# Patient Record
Sex: Male | Born: 1940
Health system: Southern US, Community
[De-identification: ages and names within clinical notes are randomized; demographics above are authoritative.]

## PROBLEM LIST (undated history)

## (undated) DIAGNOSIS — E785 Hyperlipidemia, unspecified: Secondary | ICD-10-CM

## (undated) DIAGNOSIS — M1990 Unspecified osteoarthritis, unspecified site: Secondary | ICD-10-CM

## (undated) DIAGNOSIS — N5089 Other specified disorders of the male genital organs: Secondary | ICD-10-CM

## (undated) DIAGNOSIS — G4730 Sleep apnea, unspecified: Secondary | ICD-10-CM

## (undated) DIAGNOSIS — IMO0001 Reserved for inherently not codable concepts without codable children: Secondary | ICD-10-CM

## (undated) DIAGNOSIS — R001 Bradycardia, unspecified: Secondary | ICD-10-CM

## (undated) DIAGNOSIS — I10 Essential (primary) hypertension: Secondary | ICD-10-CM

## (undated) DIAGNOSIS — Q631 Lobulated, fused and horseshoe kidney: Secondary | ICD-10-CM

## (undated) DIAGNOSIS — N189 Chronic kidney disease, unspecified: Secondary | ICD-10-CM

## (undated) DIAGNOSIS — Z531 Procedure and treatment not carried out because of patient's decision for reasons of belief and group pressure: Secondary | ICD-10-CM

## (undated) DIAGNOSIS — D649 Anemia, unspecified: Secondary | ICD-10-CM

## (undated) DIAGNOSIS — E039 Hypothyroidism, unspecified: Secondary | ICD-10-CM

## (undated) HISTORY — DX: Chronic kidney disease, unspecified: N18.9

## (undated) HISTORY — DX: Hyperlipidemia, unspecified: E78.5

## (undated) HISTORY — DX: Essential (primary) hypertension: I10

## (undated) HISTORY — PX: COLONOSCOPY W/ POLYPECTOMY: SHX1380

---
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
## 1991-10-10 HISTORY — PX: ADRENALECTOMY: SHX876

## 2001-04-22 ENCOUNTER — Encounter (INDEPENDENT_AMBULATORY_CARE_PROVIDER_SITE_OTHER): Payer: Self-pay | Admitting: Specialist

## 2001-04-22 ENCOUNTER — Ambulatory Visit (HOSPITAL_COMMUNITY): Admission: RE | Admit: 2001-04-22 | Discharge: 2001-04-22 | Payer: Self-pay | Admitting: Gastroenterology

## 2001-09-09 ENCOUNTER — Encounter: Payer: Self-pay | Admitting: Gastroenterology

## 2001-09-09 ENCOUNTER — Ambulatory Visit (HOSPITAL_COMMUNITY): Admission: RE | Admit: 2001-09-09 | Discharge: 2001-09-09 | Payer: Self-pay | Admitting: Gastroenterology

## 2005-01-02 ENCOUNTER — Ambulatory Visit (HOSPITAL_COMMUNITY): Admission: RE | Admit: 2005-01-02 | Discharge: 2005-01-02 | Payer: Self-pay | Admitting: Gastroenterology

## 2006-05-16 ENCOUNTER — Emergency Department (HOSPITAL_COMMUNITY): Admission: EM | Admit: 2006-05-16 | Discharge: 2006-05-16 | Payer: Self-pay | Admitting: Emergency Medicine

## 2008-05-01 ENCOUNTER — Ambulatory Visit (HOSPITAL_BASED_OUTPATIENT_CLINIC_OR_DEPARTMENT_OTHER): Admission: RE | Admit: 2008-05-01 | Discharge: 2008-05-01 | Payer: Self-pay | Admitting: Internal Medicine

## 2008-05-01 ENCOUNTER — Encounter: Payer: Self-pay | Admitting: Pulmonary Disease

## 2008-05-11 ENCOUNTER — Ambulatory Visit: Payer: Self-pay | Admitting: Pulmonary Disease

## 2008-05-26 ENCOUNTER — Ambulatory Visit: Payer: Self-pay | Admitting: Pulmonary Disease

## 2008-05-26 DIAGNOSIS — E119 Type 2 diabetes mellitus without complications: Secondary | ICD-10-CM | POA: Insufficient documentation

## 2008-05-26 DIAGNOSIS — E785 Hyperlipidemia, unspecified: Secondary | ICD-10-CM

## 2008-05-26 DIAGNOSIS — I10 Essential (primary) hypertension: Secondary | ICD-10-CM

## 2008-05-26 DIAGNOSIS — J309 Allergic rhinitis, unspecified: Secondary | ICD-10-CM | POA: Insufficient documentation

## 2008-05-26 DIAGNOSIS — G4730 Sleep apnea, unspecified: Secondary | ICD-10-CM

## 2008-05-26 DIAGNOSIS — G4733 Obstructive sleep apnea (adult) (pediatric): Secondary | ICD-10-CM | POA: Insufficient documentation

## 2008-07-31 ENCOUNTER — Ambulatory Visit (HOSPITAL_BASED_OUTPATIENT_CLINIC_OR_DEPARTMENT_OTHER): Admission: RE | Admit: 2008-07-31 | Discharge: 2008-07-31 | Payer: Self-pay | Admitting: Pulmonary Disease

## 2008-08-03 ENCOUNTER — Ambulatory Visit: Payer: Self-pay | Admitting: Pulmonary Disease

## 2008-08-11 ENCOUNTER — Encounter: Payer: Self-pay | Admitting: Pulmonary Disease

## 2008-09-30 ENCOUNTER — Ambulatory Visit: Payer: Self-pay | Admitting: Pulmonary Disease

## 2010-07-18 ENCOUNTER — Telehealth (INDEPENDENT_AMBULATORY_CARE_PROVIDER_SITE_OTHER): Payer: Self-pay | Admitting: *Deleted

## 2010-08-12 ENCOUNTER — Ambulatory Visit: Payer: Self-pay | Admitting: Pulmonary Disease

## 2010-11-08 NOTE — Progress Notes (Signed)
Summary: cpap mask order  Phone Note Call from Patient Call back at Home Phone [phone]   Caller: Patient Call For: alva Summary of Call: pt needs new face mask for cpap. adv home care. call pt cell [phone] or home (later today). Initial call taken by: Cooper Render, CNA,  July 18, 2010 12:32 PM  Follow-up for Phone Call        Pt has appt to see RA on 08-12-10 at 230pm; understands that he hasnt been seen since 12-09; pt is wondering if RA will help him out and send order for new CPAP mask before that appt.Clayborne Dana CMA  July 18, 2010 3:54 PM   done Follow-up by: Leanna Sato. Elsworth Soho MD,  July 18, 2010 5:54 PM  Additional Follow-up for Phone Call Additional follow up Details #1::        ATC pt's home and cell #s - [place] RN  July 19, 2010 9:01 AM  Pt returned call from triage. call pt at [phone]. Cooper Render, CNA  July 19, 2010 11:04 AM     Additional Follow-up for Phone Call Additional follow up Details #2::    Called, spoke with pt.  He was informed order for cpap mask sent to [facility] and to keep scheduled f/u appt with RA for 08/12/10 -- pt verbalized understanding.   Follow-up by: Raymondo Band RN,  July 19, 2010 11:11 AM

## 2010-11-08 NOTE — Assessment & Plan Note (Signed)
Summary: Sleep follow up-last OV 12-09/needs new CPAP mask/kcw   Visit Type:  Follow-up Copy to:  [facility] Primary Provider/Referring Provider:  [facility]  CC:  Pt states is using CPAP everynight approx 4 hours each.  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  Overnight PSG 7/09 >> severe obstructive sleep apnea with hypopneas , AHI 63/h, lowest desatn of 78%, longest event 52s. nocturnal cough noted. Improved with CPPA 15 cm  12/09  complinat with CPAP, less tired, better rest at night, , denies naps, c/o mask irritating bridge of nose. Has lost 7 lbs, off actose  August 12, 2010 2:57 PM - FU after 2 yrs changed mask after 2 y, pressure ok, uses CPAP every night, denies excessive daytime somnolence, snoring, feels rested on waking up  Preventive Screening-Counseling & Management  Alcohol-Tobacco     Smoking Status: quit     Year Quit: 1970     Pack years: 1/2ppd x4 yrs  Current Medications (verified): 1)  Levothyroxine Sodium 75 Mcg  Tabs (Levothyroxine Sodium) .... Take 1 Tablet By Mouth Once A Day 2)  Doxazosin Mesylate 8 Mg  Tabs (Doxazosin Mesylate) .... Take 1 Tablet By Mouth Once A Day 3)  Simvastatin 40 Mg  Tabs (Simvastatin) .... Take 1 Tablet By Mouth Once A Day 4)  Furosemide 40 Mg  Tabs (Furosemide) .Marland Kitchen.. 1 1/2 Once Daily 5)  Amlodipine Besylate 10 Mg Tabs (Amlodipine Besylate) .... Take 1 Tablet By Mouth Once A Day 6)  Lisinopril 40 Mg Tabs (Lisinopril) .... Take 1 Tablet By Mouth Once A Day 7)  Atenolol 50 Mg  Tabs (Atenolol) .... Take 1 Tablet By Mouth Once A Day 8)  Testosterone Cream .... Once Daily 9)  Cpap 15 Cm 10)  Aspirin Adult Low Strength 81 Mg Tbec (Aspirin) .Marland Kitchen.. 1 Once Daily 11)  Carvedilol 12.5 Mg Tabs (Carvedilol) .... Take 1 Tablet By Mouth Two Times A Day 12)  Humalog Mix 75/25 Kwikpen 75-25 % Susp (Insulin Lispro Prot & Lispro) .... 20 Units  Every Morning and 16 Units At Bedtime  Allergies  (verified): 1)  ! Penicillin  Past History:  Past Medical History: Last updated: 05/26/2008 Allergic Rhinitis Diabetes, Type 2 Hyperlipidemia Hypertension  Social History: Last updated: 05/26/2008 Patient states former smoker.  Patient is divorced and has 5 children  Review of Systems  The patient denies anorexia, fever, weight loss, weight gain, vision loss, decreased hearing, hoarseness, chest pain, syncope, dyspnea on exertion, peripheral edema, prolonged cough, headaches, hemoptysis, abdominal pain, melena, hematochezia, severe indigestion/heartburn, hematuria, muscle weakness, suspicious skin lesions, difficulty walking, depression, unusual weight change, abnormal bleeding, enlarged lymph nodes, and angioedema.    Vital Signs:  Patient profile:   70 year old male Height:      72 inches Weight:      284 pounds BMI:     38.66 O2 Sat:      97 % on Room air Temp:     98.1 degrees F oral Pulse rate:   62 / minute BP sitting:   150 / 80  (left arm) Cuff size:   large  Vitals Entered By: Iran Planas CMA (August 12, 2010 2:31 PM)  O2 Flow:  Room air CC: Pt states is using CPAP everynight approx 4 hours each Comments Medications reviewed with patient Verified contact number and pharmacy with patient Iran Planas CMA  August 12, 2010 2:32 PM    Physical Exam  Additional Exam:  wt 284 August 12, 2010  Gen. Pleasant, obese, in no distress ENT - no lesions, no post nasal drip, class 2 airway Neck: No JVD, no thyromegaly, no carotid bruits Lungs: no use of accessory muscles, no dullness to percussion, clear without rales or rhonchi  Cardiovascular: Rhythm regular, heart sounds  normal, no murmurs or gallops, no peripheral edema Musculoskeletal: No deformities, no cyanosis or clubbing      Impression & Recommendations:  Problem # 1:  OBSTRUCTIVE SLEEP APNEA (ICD-780.57)  ct CPAP 15 cm Compliance encouraged, wt loss emphasized, asked to avoid meds with  sedative side effects, cautioned against driving when sleepy.   Orders: Est. Patient Level III [id number]  Medications Added to Medication List This Visit: 1)  Amlodipine Besylate 10 Mg Tabs (Amlodipine besylate) .... Take 1 tablet by mouth once a day 2)  Lisinopril 40 Mg Tabs (Lisinopril) .... Take 1 tablet by mouth once a day 3)  Testosterone Cream  .... Once daily 4)  Carvedilol 12.5 Mg Tabs (Carvedilol) .... Take 1 tablet by mouth two times a day 5)  Humalog Mix 75/25 Kwikpen 75-25 % Susp (Insulin lispro prot & lispro) .... 20 units  every morning and 16 units at bedtime  Patient Instructions: 1)  Copy sent to: Dr Dagmar Hait 2)  Please schedule a follow-up appointment in 1 year. 3)  Change filters every 6 months    Immunization History:  Influenza Immunization History:    Influenza:  historical (06/13/2010)

## 2011-02-21 NOTE — Procedures (Signed)
NAMECAELON, KALLSEN NO.:  [id number]   MEDICAL RECORD NO.:  [id number]          PATIENT TYPE:  OUT   LOCATION:  SLEEP CENTER                 FACILITY[facility]  [facility]   PHYSICIAN:  Rigoberto Noel, MD      DATE OF BIRTH:  1941/03/11   DATE OF STUDY:  05/01/2008                            NOCTURNAL POLYSOMNOGRAM   REFERRING PHYSICIAN:   INDICATION FOR STUDY:  Mr. Rhew is a 70 year old African American  gentleman with excessive daytime somnolence, nonrefreshing sleep and  nocturia.  At the time of the study, he weighed 250 pounds, height 6  feet with a BMI of 34 and a neck size of 19 inches.   MEDICATIONS:  Glyburide, Cardura, Synthroid, Lipitor, Actos, aspirin,  Prinivil, Lasix, atenolol, testosterone, Glumetza, Sular, and Zocor.   This overnight polysomnogram was performed with a sleep technologist in  attendance, EEG, EOG, EMG, EKG and respiratory parameters were recorded.  Sleep stages, arousal, limb movements and respiratory data were scored  according to criteria laid out by the American Academy of sleep  medicine.   SLEEP ARCHITECTURE:  Lights out was at 9 p.m.  Lights on was at 4:32  a.m.  Total sleep time was 293 minutes with a sleep period of time of  415 minutes and sleep efficiency of 65%.  Wake after sleep onset was 123  minutes.  Sleep latency was 35 minutes and latency to REM sleep was 241  minutes.  Sleep stages with a percentage of total sleep time was N1  20.3%, N2 61.8%, N3 7% and REM sleep 10.9%, (32 minutes).  The longest  period of REM sleep was around 3:45 a.m.   RESPIRATORY DATA:  There were a total of 265 obstructive apneas, 0  central apneas, 0 mixed apneas and 42 hypopneas leading a apnea/hypopnea  index of 63.1 events per hour.  The supine AHI was 81.8 events per hour  and the REM related AHI was 65.6 events per hour.  The longest apnea was  52.2 seconds.   AROUSAL DATA:  There were a total of 141 arousals of which 11 were  spontaneous  and the rest were associated with respiratory movements  leading to an arousal index of 28.9 events per hour.  Periodic limb  movement index was 12.5 events per hour, however, one limb movement was  associated with arousal.   OXYGEN DATA:  The desaturation index was 77 events per hour, the lowest  desaturation was 78%.  He spent 59.7 minutes with saturation less than  88%.   CARDIAC DATA:  The lowest heart rate was 40 beats per minute during  sleep.  The high heart rate was artifactual, no arrhythmias were noted.   DISCUSSION:  Frequent apneas and hypopneas causing sleep fragmentation  and arousal regardless of body position.  Frequent episodes of coughing  during the night.   IMPRESSIONS:  1. Severe obstructive sleep apnea and hypopneas causing sleep      fragmentation and frequent oxygen desaturations.  2. Although periodic limb movements were observed, these were not      associated with arousal.  3. No evidence of cardiac arrhythmias or behavioral  disturbance during      sleep.  4. Nocturnal cough.   RECOMMENDATIONS:  1. The treatment options for this degree of sleep apnea, includes CPAP      therapy and weight loss.  I would recommend a CPAP titration study      in the sleep lab to achieve optimal titration given the severity of      his sleep disorder breathing.  2. He should be asked to avoid driving when sleepy and avoid      medications with sedative side effects.  3. Explore the possibility of Prinivil induced cough causing frequent      arousals.      Rigoberto Noel, MD  Electronically Signed     RVA/MEDQ  D:  05/11/2008 09:49:59  T:  05/11/2008 10:16:56  Job:  [id number]   cc:   Berneta Sages, M.D.  Fax: [phone]

## 2011-02-24 NOTE — Procedures (Signed)
[facility]  Patient:    David Nguyen, David Nguyen                       MRN: [id number] Proc. Date: 04/22/01 Adm. Date:  [id number] Attending:  Rafael Bihari CC:         Crist Infante, M.D.   Procedure Report  PROCEDURES:  Colonoscopy with polypectomy.  INDICATIONS FOR PROCEDURE:  Polyps seen on flexible sigmoidoscopy.  DESCRIPTION OF PROCEDURE:  The patient was placed in the left lateral decubitus position and placed on the pulse monitor with continuous low-flow oxygen delivered by nasal cannula.  He was sedated with 70 mg of IV Demerol and 5 mg of IV Versed.  The Olympus video colonoscope was inserted into the rectum and advanced its entire length, but due to presumed large size of the patient, the cecum was not reached despite insertion of the scope its entire length.  It was not clear, but felt that the point of most proximal evaluation was probably somewhere in the mid transverse colon.  The prep was fairly good and the area was examined.  The visualized portion of the transverse colon, as well as the descending and sigmoid appeared normal with no masses, polyps, diverticula, or other mucosal abnormalities.  Within the rectum at approximately 12 cm above the anus was seen a 1.5 cm, sessile, slightly irregular polyp which was removed by snare.  The remainder of the rectum appeared normal.  The colonoscope was then withdrawn.  The patient was then returned to the recovery room in stable condition.  He tolerated the procedure well and there were no immediate complications.  IMPRESSION: 1. Rectal polyp. 2. Incomplete colonoscopy.  PLAN:  Await histology and presuming no invasive cancer, will proceed with barium enema at some point in the new few months. DD:  04/22/01 TD:  04/22/01 Job: [id number]

## 2011-02-24 NOTE — Procedures (Signed)
David Nguyen, David Nguyen NO.:  [id number]   MEDICAL RECORD NO.:  [id number]          PATIENT TYPE:  OUT   LOCATION:  SLEEP CENTER                 FACILITY:  [facility]   PHYSICIAN:  Rigoberto Noel, MD      DATE OF BIRTH:  07-Aug-1941   DATE OF STUDY:  08/03/2008                            NOCTURNAL POLYSOMNOGRAM   REFERRING PHYSICIAN:   INDICATION FOR THE STUDY:  David Nguyen is a 70 year old gentleman with  severe obstructive sleep apnea based on an overnight polysomnogram and  diagnostic polysomnogram in July 2009 with AHI 63 per hour and a lowest  desaturation of 78%.  At the time of this study, his weight was 260  pounds with a height of 6 feet and a BMI of 35, neck size 19 inches, and  Epworth sleepiness score of 6/24.   This CPAP titration study was performed with a sleep technologist in  attendance.  EEG, EOG, EMG, EKG, and respiratory parameters were  recorded.  Sleep stage is arousal.  Limb movements and respiratory data  were scored according to criteria laid out by the American Academy of  Sleep Medicine.   SLEEP ARCHITECTURE:  Light felt was at 9:39 p.m.  Light sound was at  5:43 a.m.  Total sleep time was 386.5 minutes with a sleep period time  of 481.5 minutes and a sleep efficiency of 80%.  Sleep latency was 3  minutes with no wake-ups.  Sleep onset of 95 minutes.  Sleep stages as a  percentage of total sleep time was N1 4.1%, N2 72.30%, and REM sleep  23.5% (91 minutes).  There was good REM progression through the night.  Supine sleep accounted for 187 minutes.   AROUSAL DATA:  There were a total of 79 arousals with an arousal index  of 12.3 events per hour.  Of these, 45 were spontaneous, 9 were  associated with periodic limb movements.   RESPIRATORY DATA:  CPAP was initiated at 5 cm and titrated up with 0  respiratory events and snoring.  At CPAP of 8 cm were 80 minutes of  sleep including 20 minutes of REM sleep, 2 obstructive apneas, 3 central  apneas, and 10 arousals were noted with a lowest desaturation of 87%.   At the level of 13 cm for 72 minutes of sleep including 21.5 minutes of  REM sleep, 6 obstructive apneas and 7 arousals were noted with a lowest  desaturation of 89%.   At the level of 15 cm for 51.5 minutes including 14 minutes of REM  sleep, 1 obstructive apnea and 2 hypopneas were noted with 5 arousals  and a lowest desaturation of 91%.  This appears to be the optimal level  used during the study.   PERIODIC LIMB MOVEMENT DATA:  A 253 periodic limb movements were  observed with an index of 39 events per hour.  The PLM arousal index was  1.4 events per hour.  PLM seemed to be predominant in the lower levels  of CPAP and were obliterated by high levels of CPAP.   OXYGEN SATURATION DATA:  Low desaturation at the final and the optimal  level of 15 cm was 91%.   DISCUSSION:  Severe periodic limb movements were noted at lower levels  of CPAP, which seemed to disappear at higher levels, 2 premature  ventricular contractions were noted.  The lowest heart rate was 36 beats  per minute during REM sleep.   IMPRESSION:  1. Severe obstructive sleep apnea, seems to be corrected, an optimal      CPAP level of 15 cm.  2. Periodic limb movements, seemed to be corrected at higher levels of      CPAP.  3. No evidence of cardiac arrhythmias or behavioral disturbance during      sleep.   RECOMMEND:  1. CPAP should be initiated with a goal of 15 cm with heated humidity.      The patient was desensitized with a ResMed Mirage Quattro medium-      sized mask.  2. He should be monitored for compliance at this level.  3. He should be asked to avoid medications with sedative side effects      and asked to avoid driving when sleeping.      Rigoberto Noel, MD  Electronically Signed     RVA/MEDQ  D:  08/03/2008 14:07:48  T:  08/04/2008 00:50:24  Job:  [id number]

## 2011-02-24 NOTE — Procedures (Signed)
[facility]  Patient:    David Nguyen, David Nguyen                       MRN: [id number] Proc. Date: 04/22/01 Adm. Date:  [id number] Attending:  Rafael Bihari CC:         Crist Infante, M.D.   Procedure Report  PROCEDURE PERFORMED:  Colonoscopy.  ENDOSCOPIST:  Elyse Jarvis. Amedeo Plenty, M.D.  INDICATIONS FOR PROCEDURE:  Family history of colon cancer in a first degree relative and polyps seen on flexible sigmoidoscopy.  DESCRIPTION OF PROCEDURE:  The patient was placed in the left lateral decubitus position and placed on the pulse monitor with continuous low flow oxygen delivered by nasal cannula.  He was sedated with 70 mg IV Demerol and 5 mg IV Versed.  The Olympus video colonoscope was inserted into the rectum and advanced to the cecum, confirmed by transillumination of McBurneys point. DD:  04/22/01 TD:  04/22/01 Job: [id number]

## 2011-02-24 NOTE — Op Note (Signed)
David Nguyen, ZIMMERLE                ACCOUNT NO.:  [id number]   MEDICAL RECORD NO.:  [id number]          PATIENT TYPE:  AMB   LOCATION:  ENDO                         FACILITY:  [facility]   PHYSICIAN:  John C. Amedeo Plenty, M.D.    DATE OF BIRTH:  [birth year]   DATE OF PROCEDURE:  01/02/2005  DATE OF DISCHARGE:                                 OPERATIVE REPORT   PROCEDURE:  Colonoscopy.   INDICATIONS FOR PROCEDURE:  History of adenomatous colon polyps.   PROCEDURE:  The patient was placed in the left lateral decubitus position  and placed on the pulse monitor with continuous low flow oxygen delivered by  nasal cannula.  He was sedated with 50 mcg IV fentanyl and 6 mg IV Versed.  The Olympus video colonoscope was inserted into the rectum and advanced to  the cecum, confirmed by transillumination at McBurney's point and  visualization of the ileocecal valve and appendiceal orifice.  The prep was  good.  The cecum, ascending, transverse, descending, and sigmoid colon all  appeared normal, with no masses, polyps, diverticula, or other mucosal  abnormalities.  The rectum likewise appeared normal on retroflexed view.  The anus revealed no obvious internal hemorrhoids.  The scope was then  withdrawn and the patient returned to the recovery room in stable condition.  He tolerated the procedure well, and there were no immediate complications.   IMPRESSION:  Normal colonoscopy.   PLAN:  Repeat study in five years.      JCH/MEDQ  D:  01/02/2005  T:  01/02/2005  Job:  [id number]   cc:   Elta Guadeloupe A. Joylene Draft, M.D.  [address]  [place]  [place] [address]  Fax: [phone]

## 2011-04-19 ENCOUNTER — Telehealth: Payer: Self-pay | Admitting: Pulmonary Disease

## 2011-04-19 DIAGNOSIS — G4733 Obstructive sleep apnea (adult) (pediatric): Secondary | ICD-10-CM

## 2011-04-19 NOTE — Telephone Encounter (Signed)
Void pt to call back w/ more info

## 2011-04-19 NOTE — Telephone Encounter (Signed)
Pt needs a new CPAP mask through [facility], his new DME. Pt last seen in Nov 2011. And not due for F/U  For 1 year. Will send order to [facility].

## 2011-04-20 ENCOUNTER — Telehealth: Payer: Self-pay | Admitting: Pulmonary Disease

## 2011-04-20 NOTE — Telephone Encounter (Signed)
Spoke with pt. He states that we order him new CPAP mask yesterday, but the order was sent to [facility] instead of [facility]. I advised that according to the chart, the order was sent to [facility]. Will forward to [facility] to see what happened. Please advise thanks

## 2011-04-20 NOTE — Telephone Encounter (Signed)
Order refaxed to [facility] to get cpap mask

## 2011-05-31 ENCOUNTER — Telehealth: Payer: Self-pay | Admitting: Pulmonary Disease

## 2011-05-31 NOTE — Telephone Encounter (Signed)
AHI 8.9 acceptable on cpap 15 cm but complaince about 3h 45 m Needs to increase usage - 4-6h every night advised

## 2011-06-06 ENCOUNTER — Encounter: Payer: Self-pay | Admitting: Pulmonary Disease

## 2011-06-07 NOTE — Telephone Encounter (Signed)
lmomtcb x1 

## 2011-06-08 NOTE — Telephone Encounter (Signed)
I informed pt of RA's findings and recommendations. Pt verbalized understanding  

## 2011-06-08 NOTE — Telephone Encounter (Signed)
PT RETURNED [place] #. David Nguyen

## 2011-09-28 ENCOUNTER — Encounter: Payer: Self-pay | Admitting: Pulmonary Disease

## 2011-09-28 ENCOUNTER — Encounter: Payer: Self-pay | Admitting: *Deleted

## 2011-09-29 ENCOUNTER — Encounter: Payer: Self-pay | Admitting: Pulmonary Disease

## 2011-09-29 ENCOUNTER — Ambulatory Visit (INDEPENDENT_AMBULATORY_CARE_PROVIDER_SITE_OTHER): Payer: Medicare PPO | Admitting: Pulmonary Disease

## 2011-09-29 VITALS — BP 162/78 | HR 56 | Temp 98.2°F | Ht 72.0 in | Wt 286.8 lb

## 2011-09-29 DIAGNOSIS — G4730 Sleep apnea, unspecified: Secondary | ICD-10-CM

## 2011-09-29 NOTE — Progress Notes (Signed)
  Subjective:    Patient ID: David Nguyen, male    DOB: 03-18-1941, 70 y.o.   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  Overnight PSG 7/09 >> severe obstructive sleep apnea with hypopneas , AHI 63/h, lowest desatn of 78%, longest event 52s. nocturnal cough noted. Improved with CPAP 15 cm  12/09 compliant, Has lost 7 lbs, off actose   8/12 >> AHI 8.9 acceptable on cpap 15 cm but complaince about 3h 45 m  Needs to increase usage    09/29/2011 Annual  FU -pressure ok, uses CPAP every night, denies excessive daytime somnolence, snoring, feels rested on waking up, no dryness, BP slight high today    Review of Systems Patient denies significant dyspnea,cough, hemoptysis,  chest pain, palpitations, pedal edema, orthopnea, paroxysmal nocturnal dyspnea, lightheadedness, nausea, vomiting, abdominal or  leg pains      Objective:   Physical Exam Gen. Pleasant, well-nourished, in no distress ENT - no lesions, no post nasal drip Neck: No JVD, no thyromegaly, no carotid bruits Lungs: no use of accessory muscles, no dullness to percussion, clear without rales or rhonchi  Cardiovascular: Rhythm regular, heart sounds  normal, no murmurs or gallops, no peripheral edema Musculoskeletal: No deformities, no cyanosis or clubbing         Assessment & Plan:

## 2011-09-29 NOTE — Patient Instructions (Signed)
Change CPAP filters every 6  Months Your BP was high today - recheck this in 2 days Use CPAP for at least 4-6 hrs every night is the expectation.

## 2011-09-30 NOTE — Assessment & Plan Note (Signed)
Severe, on CPAP 15 cm Compliant Weight loss encouraged, compliance with goal of at least 4-6 hrs every night is the expectation. Advised against medications with sedative side effects Cautioned against driving when sleepy - understanding that sleepiness will vary on a day to day basis

## 2012-03-01 ENCOUNTER — Encounter (INDEPENDENT_AMBULATORY_CARE_PROVIDER_SITE_OTHER): Payer: Self-pay | Admitting: Ophthalmology

## 2012-03-08 ENCOUNTER — Encounter (INDEPENDENT_AMBULATORY_CARE_PROVIDER_SITE_OTHER): Payer: Medicare PPO | Admitting: Ophthalmology

## 2012-03-08 DIAGNOSIS — H35039 Hypertensive retinopathy, unspecified eye: Secondary | ICD-10-CM

## 2012-03-08 DIAGNOSIS — I10 Essential (primary) hypertension: Secondary | ICD-10-CM

## 2012-03-08 DIAGNOSIS — E1165 Type 2 diabetes mellitus with hyperglycemia: Secondary | ICD-10-CM

## 2012-03-08 DIAGNOSIS — H2510 Age-related nuclear cataract, unspecified eye: Secondary | ICD-10-CM

## 2012-03-08 DIAGNOSIS — H43819 Vitreous degeneration, unspecified eye: Secondary | ICD-10-CM

## 2012-03-08 DIAGNOSIS — E11319 Type 2 diabetes mellitus with unspecified diabetic retinopathy without macular edema: Secondary | ICD-10-CM

## 2012-09-13 ENCOUNTER — Ambulatory Visit (INDEPENDENT_AMBULATORY_CARE_PROVIDER_SITE_OTHER): Payer: Medicare PPO | Admitting: Ophthalmology

## 2012-09-16 ENCOUNTER — Telehealth: Payer: Self-pay | Admitting: Pulmonary Disease

## 2012-09-16 NOTE — Telephone Encounter (Signed)
Left Message 3x to schd follow up apt. Sent letter 09/16/12. °

## 2012-09-27 ENCOUNTER — Ambulatory Visit (INDEPENDENT_AMBULATORY_CARE_PROVIDER_SITE_OTHER): Payer: Medicare PPO | Admitting: Ophthalmology

## 2012-09-27 DIAGNOSIS — H2510 Age-related nuclear cataract, unspecified eye: Secondary | ICD-10-CM

## 2012-09-27 DIAGNOSIS — H35039 Hypertensive retinopathy, unspecified eye: Secondary | ICD-10-CM

## 2012-09-27 DIAGNOSIS — I10 Essential (primary) hypertension: Secondary | ICD-10-CM

## 2012-09-27 DIAGNOSIS — E11319 Type 2 diabetes mellitus with unspecified diabetic retinopathy without macular edema: Secondary | ICD-10-CM

## 2012-09-27 DIAGNOSIS — E1165 Type 2 diabetes mellitus with hyperglycemia: Secondary | ICD-10-CM

## 2012-09-27 DIAGNOSIS — H43819 Vitreous degeneration, unspecified eye: Secondary | ICD-10-CM

## 2012-09-27 DIAGNOSIS — E1139 Type 2 diabetes mellitus with other diabetic ophthalmic complication: Secondary | ICD-10-CM

## 2012-10-07 ENCOUNTER — Encounter: Payer: Self-pay | Admitting: Pulmonary Disease

## 2012-10-07 ENCOUNTER — Ambulatory Visit (INDEPENDENT_AMBULATORY_CARE_PROVIDER_SITE_OTHER): Payer: Medicare PPO | Admitting: Pulmonary Disease

## 2012-10-07 VITALS — BP 150/76 | HR 60 | Temp 98.4°F | Ht 72.0 in | Wt 295.2 lb

## 2012-10-07 DIAGNOSIS — G4730 Sleep apnea, unspecified: Secondary | ICD-10-CM

## 2012-10-07 NOTE — Assessment & Plan Note (Signed)
Weight loss encouraged, compliance with goal of at least 4-6 hrs every night is the expectation. Advised against medications with sedative side effects Cautioned against driving when sleepy - understanding that sleepiness will vary on a day to day basis  

## 2012-10-07 NOTE — Progress Notes (Signed)
  Subjective:    Patient ID: David Nguyen, male    DOB: 17-Feb-1941, 71 y.o.   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  Overnight PSG 7/09 >> severe obstructive sleep apnea with hypopneas , AHI 63/h, lowest desatn of 78%, longest event 52s. nocturnal cough noted. Improved with CPAP 15 cm  12/09 compliant, Has lost 7 lbs, off actose  8/12 >> AHI 8.9 acceptable on cpap 15 cm but complaince about 3h 45 m  Needs to increase usage    10/07/2012 Annual FU -pressure ok, uses CPAP every night, denies excessive daytime somnolence, snoring, feels rested on waking up, no dryness wears cpap everynight x 6-8 hrs a night. is due for a new machine. needs new mask (has not changed mask in over 1 year). occasionally he feels tired during the day.   Review of Systems  neg for any significant sore throat, dysphagia, itching, sneezing, nasal congestion or excess/ purulent secretions, fever, chills, sweats, unintended wt loss, pleuritic or exertional cp, hempoptysis, orthopnea pnd or change in chronic leg swelling. Also denies presyncope, palpitations, heartburn, abdominal pain, nausea, vomiting, diarrhea or change in bowel or urinary habits, dysuria,hematuria, rash, arthralgias, visual complaints, headache, numbness weakness or ataxia.     Objective:   Physical Exam  Gen. Pleasant, obese, in no distress ENT - no lesions, no post nasal drip Neck: No JVD, no thyromegaly, no carotid bruits Lungs: no use of accessory muscles, no dullness to percussion, decreased without rales or rhonchi  Cardiovascular: Rhythm regular, heart sounds  normal, no murmurs or gallops, no peripheral edema Musculoskeletal: No deformities, no cyanosis or clubbing , no tremors       Assessment & Plan:

## 2012-10-07 NOTE — Patient Instructions (Signed)
Your CPAP is set at 15 cm Get your supplies every 6 months

## 2013-03-28 ENCOUNTER — Ambulatory Visit (INDEPENDENT_AMBULATORY_CARE_PROVIDER_SITE_OTHER): Payer: Medicare HMO | Admitting: Ophthalmology

## 2013-03-28 DIAGNOSIS — E11319 Type 2 diabetes mellitus with unspecified diabetic retinopathy without macular edema: Secondary | ICD-10-CM

## 2013-03-28 DIAGNOSIS — H2510 Age-related nuclear cataract, unspecified eye: Secondary | ICD-10-CM

## 2013-03-28 DIAGNOSIS — H35039 Hypertensive retinopathy, unspecified eye: Secondary | ICD-10-CM

## 2013-03-28 DIAGNOSIS — H43819 Vitreous degeneration, unspecified eye: Secondary | ICD-10-CM

## 2013-03-28 DIAGNOSIS — I10 Essential (primary) hypertension: Secondary | ICD-10-CM

## 2013-03-28 DIAGNOSIS — E1139 Type 2 diabetes mellitus with other diabetic ophthalmic complication: Secondary | ICD-10-CM

## 2013-10-17 ENCOUNTER — Ambulatory Visit (INDEPENDENT_AMBULATORY_CARE_PROVIDER_SITE_OTHER): Payer: Commercial Managed Care - HMO | Admitting: Ophthalmology

## 2013-10-23 ENCOUNTER — Ambulatory Visit (INDEPENDENT_AMBULATORY_CARE_PROVIDER_SITE_OTHER): Payer: Medicare HMO | Admitting: Ophthalmology

## 2013-11-07 ENCOUNTER — Ambulatory Visit (INDEPENDENT_AMBULATORY_CARE_PROVIDER_SITE_OTHER): Payer: Medicare HMO | Admitting: Ophthalmology

## 2013-11-07 DIAGNOSIS — I10 Essential (primary) hypertension: Secondary | ICD-10-CM

## 2013-11-07 DIAGNOSIS — H2510 Age-related nuclear cataract, unspecified eye: Secondary | ICD-10-CM

## 2013-11-07 DIAGNOSIS — H35039 Hypertensive retinopathy, unspecified eye: Secondary | ICD-10-CM

## 2013-11-07 DIAGNOSIS — E1165 Type 2 diabetes mellitus with hyperglycemia: Secondary | ICD-10-CM

## 2013-11-07 DIAGNOSIS — H43819 Vitreous degeneration, unspecified eye: Secondary | ICD-10-CM

## 2013-11-07 DIAGNOSIS — E1139 Type 2 diabetes mellitus with other diabetic ophthalmic complication: Secondary | ICD-10-CM

## 2013-11-07 DIAGNOSIS — E11319 Type 2 diabetes mellitus with unspecified diabetic retinopathy without macular edema: Secondary | ICD-10-CM

## 2013-11-11 ENCOUNTER — Ambulatory Visit (INDEPENDENT_AMBULATORY_CARE_PROVIDER_SITE_OTHER): Payer: Medicare PPO | Admitting: Pulmonary Disease

## 2013-11-11 ENCOUNTER — Encounter (INDEPENDENT_AMBULATORY_CARE_PROVIDER_SITE_OTHER): Payer: Self-pay

## 2013-11-11 ENCOUNTER — Encounter: Payer: Self-pay | Admitting: Pulmonary Disease

## 2013-11-11 VITALS — BP 126/70 | HR 68 | Wt 250.0 lb

## 2013-11-11 DIAGNOSIS — G4730 Sleep apnea, unspecified: Secondary | ICD-10-CM

## 2013-11-11 NOTE — Assessment & Plan Note (Signed)
Your cpap is set at 15 cm We will check pressure on your cpap report  Weight loss encouraged, compliance with goal of at least 4-6 hrs every night is the expectation. Advised against medications with sedative side effects Cautioned against driving when sleepy - understanding that sleepiness will vary on a day to day basis

## 2013-11-11 NOTE — Patient Instructions (Signed)
Your cpap is set at 15 cm We will check pressure on your cpap report

## 2013-11-11 NOTE — Progress Notes (Signed)
   Subjective:    Patient ID: David Nguyen, male    DOB: 06/01/1941, 73 y.o.   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  Overnight PSG 7/09 >> severe obstructive sleep apnea with hypopneas , AHI 63/h, lowest desatn of 78%, longest event 52s. nocturnal cough noted. Improved with CPAP 15 cm  12/09 compliant 8/12 >> AHI 8.9 acceptable on cpap 15 cm but complaince about 3h 45 m    11/11/2013    Annual FU -he got new machine in 2014 pressure ok, uses CPAP every night, denies excessive daytime somnolence, snoring, feels rested on waking up, no dryness  wears cpap everynight x 6-8 hrs a night.      Review of Systems  neg for any significant sore throat, dysphagia, itching, sneezing, nasal congestion or excess/ purulent secretions, fever, chills, sweats, unintended wt loss, pleuritic or exertional cp, hempoptysis, orthopnea pnd or change in chronic leg swelling. Also denies presyncope, palpitations, heartburn, abdominal pain, nausea, vomiting, diarrhea or change in bowel or urinary habits, dysuria,hematuria, rash, arthralgias, visual complaints, headache, numbness weakness or ataxia.     Objective:   Physical Exam  Gen. Pleasant, obese, in no distress ENT - no lesions, no post nasal drip Neck: No JVD, no thyromegaly, no carotid bruits Lungs: no use of accessory muscles, no dullness to percussion, decreased without rales or rhonchi  Cardiovascular: Rhythm regular, heart sounds  normal, no murmurs or gallops, no peripheral edema Musculoskeletal: No deformities, no cyanosis or clubbing , no tremors        Assessment & Plan:

## 2013-12-12 ENCOUNTER — Telehealth: Payer: Self-pay | Admitting: *Deleted

## 2013-12-12 ENCOUNTER — Telehealth: Payer: Self-pay | Admitting: Pulmonary Disease

## 2013-12-12 DIAGNOSIS — G4733 Obstructive sleep apnea (adult) (pediatric): Secondary | ICD-10-CM

## 2013-12-12 NOTE — Telephone Encounter (Signed)
Pt aware of results 

## 2013-12-12 NOTE — Telephone Encounter (Signed)
Good usage AHI 20/h Increase CPAP to 16 cm and recheck download in 1 month --  Spouse aware of results. Order placed. Nothing further needed

## 2014-06-05 ENCOUNTER — Ambulatory Visit (INDEPENDENT_AMBULATORY_CARE_PROVIDER_SITE_OTHER): Payer: Commercial Managed Care - HMO | Admitting: Ophthalmology

## 2014-06-05 DIAGNOSIS — E1165 Type 2 diabetes mellitus with hyperglycemia: Secondary | ICD-10-CM

## 2014-06-05 DIAGNOSIS — E1139 Type 2 diabetes mellitus with other diabetic ophthalmic complication: Secondary | ICD-10-CM

## 2014-06-05 DIAGNOSIS — H35039 Hypertensive retinopathy, unspecified eye: Secondary | ICD-10-CM

## 2014-06-05 DIAGNOSIS — E11319 Type 2 diabetes mellitus with unspecified diabetic retinopathy without macular edema: Secondary | ICD-10-CM

## 2014-06-05 DIAGNOSIS — I10 Essential (primary) hypertension: Secondary | ICD-10-CM

## 2014-06-05 DIAGNOSIS — H43819 Vitreous degeneration, unspecified eye: Secondary | ICD-10-CM

## 2014-06-05 DIAGNOSIS — H2510 Age-related nuclear cataract, unspecified eye: Secondary | ICD-10-CM

## 2014-10-27 DIAGNOSIS — E291 Testicular hypofunction: Secondary | ICD-10-CM | POA: Diagnosis not present

## 2014-11-04 DIAGNOSIS — E291 Testicular hypofunction: Secondary | ICD-10-CM | POA: Diagnosis not present

## 2014-11-04 DIAGNOSIS — I951 Orthostatic hypotension: Secondary | ICD-10-CM | POA: Diagnosis not present

## 2014-11-04 DIAGNOSIS — Z6838 Body mass index (BMI) 38.0-38.9, adult: Secondary | ICD-10-CM | POA: Diagnosis not present

## 2014-11-04 DIAGNOSIS — Z1389 Encounter for screening for other disorder: Secondary | ICD-10-CM | POA: Diagnosis not present

## 2014-11-04 DIAGNOSIS — N184 Chronic kidney disease, stage 4 (severe): Secondary | ICD-10-CM | POA: Diagnosis not present

## 2014-11-04 DIAGNOSIS — E1129 Type 2 diabetes mellitus with other diabetic kidney complication: Secondary | ICD-10-CM | POA: Diagnosis not present

## 2014-11-17 DIAGNOSIS — E291 Testicular hypofunction: Secondary | ICD-10-CM | POA: Diagnosis not present

## 2014-12-08 DIAGNOSIS — Z6838 Body mass index (BMI) 38.0-38.9, adult: Secondary | ICD-10-CM | POA: Diagnosis not present

## 2014-12-08 DIAGNOSIS — E1129 Type 2 diabetes mellitus with other diabetic kidney complication: Secondary | ICD-10-CM | POA: Diagnosis not present

## 2014-12-08 DIAGNOSIS — E291 Testicular hypofunction: Secondary | ICD-10-CM | POA: Diagnosis not present

## 2014-12-14 ENCOUNTER — Ambulatory Visit (INDEPENDENT_AMBULATORY_CARE_PROVIDER_SITE_OTHER): Payer: Commercial Managed Care - HMO | Admitting: Ophthalmology

## 2014-12-18 DIAGNOSIS — I10 Essential (primary) hypertension: Secondary | ICD-10-CM | POA: Diagnosis not present

## 2014-12-18 DIAGNOSIS — I951 Orthostatic hypotension: Secondary | ICD-10-CM | POA: Diagnosis not present

## 2014-12-18 DIAGNOSIS — E1322 Other specified diabetes mellitus with diabetic chronic kidney disease: Secondary | ICD-10-CM | POA: Diagnosis not present

## 2014-12-18 DIAGNOSIS — E1365 Other specified diabetes mellitus with hyperglycemia: Secondary | ICD-10-CM | POA: Diagnosis not present

## 2015-01-05 DIAGNOSIS — E291 Testicular hypofunction: Secondary | ICD-10-CM | POA: Diagnosis not present

## 2015-01-28 DIAGNOSIS — E291 Testicular hypofunction: Secondary | ICD-10-CM | POA: Diagnosis not present

## 2015-03-17 DIAGNOSIS — E291 Testicular hypofunction: Secondary | ICD-10-CM | POA: Diagnosis not present

## 2015-04-06 DIAGNOSIS — Z125 Encounter for screening for malignant neoplasm of prostate: Secondary | ICD-10-CM | POA: Diagnosis not present

## 2015-04-06 DIAGNOSIS — E039 Hypothyroidism, unspecified: Secondary | ICD-10-CM | POA: Diagnosis not present

## 2015-04-06 DIAGNOSIS — M109 Gout, unspecified: Secondary | ICD-10-CM | POA: Diagnosis not present

## 2015-04-06 DIAGNOSIS — E291 Testicular hypofunction: Secondary | ICD-10-CM | POA: Diagnosis not present

## 2015-04-06 DIAGNOSIS — E1129 Type 2 diabetes mellitus with other diabetic kidney complication: Secondary | ICD-10-CM | POA: Diagnosis not present

## 2015-04-13 DIAGNOSIS — I4510 Unspecified right bundle-branch block: Secondary | ICD-10-CM | POA: Diagnosis not present

## 2015-04-13 DIAGNOSIS — Z23 Encounter for immunization: Secondary | ICD-10-CM | POA: Diagnosis not present

## 2015-04-13 DIAGNOSIS — E291 Testicular hypofunction: Secondary | ICD-10-CM | POA: Diagnosis not present

## 2015-04-13 DIAGNOSIS — M109 Gout, unspecified: Secondary | ICD-10-CM | POA: Diagnosis not present

## 2015-04-13 DIAGNOSIS — Z0000 Encounter for general adult medical examination without abnormal findings: Secondary | ICD-10-CM | POA: Diagnosis not present

## 2015-04-13 DIAGNOSIS — I872 Venous insufficiency (chronic) (peripheral): Secondary | ICD-10-CM | POA: Diagnosis not present

## 2015-04-13 DIAGNOSIS — N184 Chronic kidney disease, stage 4 (severe): Secondary | ICD-10-CM | POA: Diagnosis not present

## 2015-04-13 DIAGNOSIS — K5900 Constipation, unspecified: Secondary | ICD-10-CM | POA: Diagnosis not present

## 2015-04-13 DIAGNOSIS — E1129 Type 2 diabetes mellitus with other diabetic kidney complication: Secondary | ICD-10-CM | POA: Diagnosis not present

## 2015-04-14 DIAGNOSIS — Z1212 Encounter for screening for malignant neoplasm of rectum: Secondary | ICD-10-CM | POA: Diagnosis not present

## 2015-04-29 DIAGNOSIS — E291 Testicular hypofunction: Secondary | ICD-10-CM | POA: Diagnosis not present

## 2015-05-18 DIAGNOSIS — D122 Benign neoplasm of ascending colon: Secondary | ICD-10-CM | POA: Diagnosis not present

## 2015-05-18 DIAGNOSIS — Z09 Encounter for follow-up examination after completed treatment for conditions other than malignant neoplasm: Secondary | ICD-10-CM | POA: Diagnosis not present

## 2015-05-18 DIAGNOSIS — Z86010 Personal history of colonic polyps: Secondary | ICD-10-CM | POA: Diagnosis not present

## 2015-05-18 DIAGNOSIS — D123 Benign neoplasm of transverse colon: Secondary | ICD-10-CM | POA: Diagnosis not present

## 2015-05-27 DIAGNOSIS — E1129 Type 2 diabetes mellitus with other diabetic kidney complication: Secondary | ICD-10-CM | POA: Diagnosis not present

## 2015-05-27 DIAGNOSIS — D631 Anemia in chronic kidney disease: Secondary | ICD-10-CM | POA: Diagnosis not present

## 2015-05-27 DIAGNOSIS — E291 Testicular hypofunction: Secondary | ICD-10-CM | POA: Diagnosis not present

## 2015-05-27 DIAGNOSIS — I129 Hypertensive chronic kidney disease with stage 1 through stage 4 chronic kidney disease, or unspecified chronic kidney disease: Secondary | ICD-10-CM | POA: Diagnosis not present

## 2015-05-27 DIAGNOSIS — N184 Chronic kidney disease, stage 4 (severe): Secondary | ICD-10-CM | POA: Diagnosis not present

## 2015-06-17 DIAGNOSIS — E291 Testicular hypofunction: Secondary | ICD-10-CM | POA: Diagnosis not present

## 2015-06-30 DIAGNOSIS — N2581 Secondary hyperparathyroidism of renal origin: Secondary | ICD-10-CM | POA: Diagnosis not present

## 2015-06-30 DIAGNOSIS — N184 Chronic kidney disease, stage 4 (severe): Secondary | ICD-10-CM | POA: Diagnosis not present

## 2015-07-03 DIAGNOSIS — Z23 Encounter for immunization: Secondary | ICD-10-CM | POA: Diagnosis not present

## 2015-07-06 DIAGNOSIS — E291 Testicular hypofunction: Secondary | ICD-10-CM | POA: Diagnosis not present

## 2015-07-29 DIAGNOSIS — E291 Testicular hypofunction: Secondary | ICD-10-CM | POA: Diagnosis not present

## 2015-08-05 DIAGNOSIS — H6121 Impacted cerumen, right ear: Secondary | ICD-10-CM | POA: Diagnosis not present

## 2015-08-05 DIAGNOSIS — Z6841 Body Mass Index (BMI) 40.0 and over, adult: Secondary | ICD-10-CM | POA: Diagnosis not present

## 2015-08-05 DIAGNOSIS — R42 Dizziness and giddiness: Secondary | ICD-10-CM | POA: Diagnosis not present

## 2015-08-05 DIAGNOSIS — J309 Allergic rhinitis, unspecified: Secondary | ICD-10-CM | POA: Diagnosis not present

## 2015-08-10 DIAGNOSIS — M109 Gout, unspecified: Secondary | ICD-10-CM | POA: Diagnosis not present

## 2015-08-10 DIAGNOSIS — N184 Chronic kidney disease, stage 4 (severe): Secondary | ICD-10-CM | POA: Diagnosis not present

## 2015-08-10 DIAGNOSIS — D631 Anemia in chronic kidney disease: Secondary | ICD-10-CM | POA: Diagnosis not present

## 2015-08-10 DIAGNOSIS — E1129 Type 2 diabetes mellitus with other diabetic kidney complication: Secondary | ICD-10-CM | POA: Diagnosis not present

## 2015-08-10 DIAGNOSIS — I129 Hypertensive chronic kidney disease with stage 1 through stage 4 chronic kidney disease, or unspecified chronic kidney disease: Secondary | ICD-10-CM | POA: Diagnosis not present

## 2015-08-24 DIAGNOSIS — E291 Testicular hypofunction: Secondary | ICD-10-CM | POA: Diagnosis not present

## 2015-08-31 DIAGNOSIS — N184 Chronic kidney disease, stage 4 (severe): Secondary | ICD-10-CM | POA: Diagnosis not present

## 2015-09-14 DIAGNOSIS — E291 Testicular hypofunction: Secondary | ICD-10-CM | POA: Diagnosis not present

## 2015-09-23 DIAGNOSIS — M1000 Idiopathic gout, unspecified site: Secondary | ICD-10-CM | POA: Diagnosis not present

## 2015-09-23 DIAGNOSIS — E039 Hypothyroidism, unspecified: Secondary | ICD-10-CM | POA: Diagnosis not present

## 2015-09-23 DIAGNOSIS — I10 Essential (primary) hypertension: Secondary | ICD-10-CM | POA: Diagnosis not present

## 2015-09-23 DIAGNOSIS — Z0000 Encounter for general adult medical examination without abnormal findings: Secondary | ICD-10-CM | POA: Diagnosis not present

## 2015-09-23 DIAGNOSIS — E1169 Type 2 diabetes mellitus with other specified complication: Secondary | ICD-10-CM | POA: Diagnosis not present

## 2015-09-23 DIAGNOSIS — E784 Other hyperlipidemia: Secondary | ICD-10-CM | POA: Diagnosis not present

## 2015-09-23 DIAGNOSIS — I739 Peripheral vascular disease, unspecified: Secondary | ICD-10-CM | POA: Diagnosis not present

## 2015-10-06 ENCOUNTER — Telehealth: Payer: Self-pay | Admitting: Pulmonary Disease

## 2015-10-06 DIAGNOSIS — G4730 Sleep apnea, unspecified: Secondary | ICD-10-CM

## 2015-10-06 NOTE — Telephone Encounter (Signed)
Called and spoke with pt. Patient states that CPAP is not working correctly. He c/o it is leaking air. He request that we send a order to fix his CPAP. I explained to the patient that I would place a order for the CPAP to be evaluated by [facility]. He voiced understanding and had no further questions. Order placed. Nothing further needed.

## 2015-10-12 DIAGNOSIS — E291 Testicular hypofunction: Secondary | ICD-10-CM | POA: Diagnosis not present

## 2015-10-20 DIAGNOSIS — G4733 Obstructive sleep apnea (adult) (pediatric): Secondary | ICD-10-CM | POA: Diagnosis not present

## 2015-11-16 DIAGNOSIS — E298 Other testicular dysfunction: Secondary | ICD-10-CM | POA: Diagnosis not present

## 2015-12-01 DIAGNOSIS — D631 Anemia in chronic kidney disease: Secondary | ICD-10-CM | POA: Diagnosis not present

## 2015-12-01 DIAGNOSIS — E1129 Type 2 diabetes mellitus with other diabetic kidney complication: Secondary | ICD-10-CM | POA: Diagnosis not present

## 2015-12-01 DIAGNOSIS — N400 Enlarged prostate without lower urinary tract symptoms: Secondary | ICD-10-CM | POA: Diagnosis not present

## 2015-12-01 DIAGNOSIS — I129 Hypertensive chronic kidney disease with stage 1 through stage 4 chronic kidney disease, or unspecified chronic kidney disease: Secondary | ICD-10-CM | POA: Diagnosis not present

## 2015-12-01 DIAGNOSIS — M109 Gout, unspecified: Secondary | ICD-10-CM | POA: Diagnosis not present

## 2015-12-01 DIAGNOSIS — E785 Hyperlipidemia, unspecified: Secondary | ICD-10-CM | POA: Diagnosis not present

## 2015-12-01 DIAGNOSIS — N189 Chronic kidney disease, unspecified: Secondary | ICD-10-CM | POA: Diagnosis not present

## 2015-12-01 DIAGNOSIS — R809 Proteinuria, unspecified: Secondary | ICD-10-CM | POA: Diagnosis not present

## 2015-12-01 DIAGNOSIS — N184 Chronic kidney disease, stage 4 (severe): Secondary | ICD-10-CM | POA: Diagnosis not present

## 2015-12-01 DIAGNOSIS — N2581 Secondary hyperparathyroidism of renal origin: Secondary | ICD-10-CM | POA: Diagnosis not present

## 2015-12-15 DIAGNOSIS — R808 Other proteinuria: Secondary | ICD-10-CM | POA: Diagnosis not present

## 2015-12-15 DIAGNOSIS — J3089 Other allergic rhinitis: Secondary | ICD-10-CM | POA: Diagnosis not present

## 2015-12-15 DIAGNOSIS — E038 Other specified hypothyroidism: Secondary | ICD-10-CM | POA: Diagnosis not present

## 2015-12-15 DIAGNOSIS — N184 Chronic kidney disease, stage 4 (severe): Secondary | ICD-10-CM | POA: Diagnosis not present

## 2015-12-15 DIAGNOSIS — E1129 Type 2 diabetes mellitus with other diabetic kidney complication: Secondary | ICD-10-CM | POA: Diagnosis not present

## 2015-12-15 DIAGNOSIS — I872 Venous insufficiency (chronic) (peripheral): Secondary | ICD-10-CM | POA: Diagnosis not present

## 2015-12-15 DIAGNOSIS — E298 Other testicular dysfunction: Secondary | ICD-10-CM | POA: Diagnosis not present

## 2015-12-15 DIAGNOSIS — I10 Essential (primary) hypertension: Secondary | ICD-10-CM | POA: Diagnosis not present

## 2015-12-15 DIAGNOSIS — G4733 Obstructive sleep apnea (adult) (pediatric): Secondary | ICD-10-CM | POA: Diagnosis not present

## 2015-12-16 DIAGNOSIS — E298 Other testicular dysfunction: Secondary | ICD-10-CM | POA: Diagnosis not present

## 2016-01-04 ENCOUNTER — Encounter: Payer: Self-pay | Admitting: Pulmonary Disease

## 2016-01-04 ENCOUNTER — Ambulatory Visit (INDEPENDENT_AMBULATORY_CARE_PROVIDER_SITE_OTHER): Payer: Commercial Managed Care - HMO | Admitting: Pulmonary Disease

## 2016-01-04 VITALS — BP 136/74 | HR 57 | Ht 72.0 in | Wt 272.2 lb

## 2016-01-04 DIAGNOSIS — G4730 Sleep apnea, unspecified: Secondary | ICD-10-CM

## 2016-01-04 DIAGNOSIS — I10 Essential (primary) hypertension: Secondary | ICD-10-CM

## 2016-01-04 NOTE — Assessment & Plan Note (Signed)
Check report on CPAP CPAP supplies will be renewed x 1 year  Weight loss encouraged, compliance with goal of at least 4-6 hrs every night is the expectation. Advised against medications with sedative side effects Cautioned against driving when sleepy - understanding that sleepiness will vary on a day to day basis

## 2016-01-04 NOTE — Assessment & Plan Note (Signed)
Controlled Wt loss encouraged

## 2016-01-04 NOTE — Patient Instructions (Signed)
Check report on CPAP CPAP supplies will be renewed x 1 year

## 2016-01-04 NOTE — Addendum Note (Signed)
Addended by: Mathis Dad on: 01/04/2016 04:24 PM   Modules accepted: Orders

## 2016-01-04 NOTE — Progress Notes (Signed)
   Subjective:    Patient ID: IDO GLASSON, male    DOB: 02-06-1941, 75 y.o.   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  PSG 04/2008 >> severe obstructive sleep apnea with hypopneas , AHI 63/h, lowest desatn of 78%, longest event 52s. nocturnal cough noted. Improved with CPAP 15 cm    01/04/2016  Chief Complaint  Patient presents with  . Follow-up    doing well on CPAP machine.  no concerns.   2y FU    Annual FU -he got new machine in 2014 pressure ok, uses CPAP every night, denies excessive daytime somnolence, snoring, feels rested on waking up, no dryness  wears cpap everynight x 6-8 hrs a night.  On lasix for bipedal edema  Last report 05/2011 >> AHI 8.9 acceptable on cpap 15 cm but compliance about 3h 45 m    Review of Systems Patient denies significant dyspnea,cough, hemoptysis,  chest pain, palpitations, pedal edema, orthopnea, paroxysmal nocturnal dyspnea, lightheadedness, nausea, vomiting, abdominal or  leg pains      Objective:   Physical Exam  Gen. Pleasant, obese, in no distress ENT - no lesions, no post nasal drip Neck: No JVD, no thyromegaly, no carotid bruits Lungs: no use of accessory muscles, no dullness to percussion, decreased without rales or rhonchi  Cardiovascular: Rhythm regular, heart sounds  normal, no murmurs or gallops, no peripheral edema Musculoskeletal: No deformities, no cyanosis or clubbing , no tremors       Assessment & Plan:

## 2016-01-05 ENCOUNTER — Telehealth: Payer: Self-pay | Admitting: Pulmonary Disease

## 2016-01-05 NOTE — Telephone Encounter (Signed)
Spoke with pt. Tried to download his card on airview, there is no data on his card. He is going to contact his DME and figure out his information is not transferring.

## 2016-01-27 DIAGNOSIS — E291 Testicular hypofunction: Secondary | ICD-10-CM | POA: Diagnosis not present

## 2016-02-03 DIAGNOSIS — E113293 Type 2 diabetes mellitus with mild nonproliferative diabetic retinopathy without macular edema, bilateral: Secondary | ICD-10-CM | POA: Diagnosis not present

## 2016-02-03 DIAGNOSIS — Z0100 Encounter for examination of eyes and vision without abnormal findings: Secondary | ICD-10-CM | POA: Diagnosis not present

## 2016-02-15 DIAGNOSIS — E291 Testicular hypofunction: Secondary | ICD-10-CM | POA: Diagnosis not present

## 2016-03-02 DIAGNOSIS — E1129 Type 2 diabetes mellitus with other diabetic kidney complication: Secondary | ICD-10-CM | POA: Diagnosis not present

## 2016-03-02 DIAGNOSIS — M109 Gout, unspecified: Secondary | ICD-10-CM | POA: Diagnosis not present

## 2016-03-02 DIAGNOSIS — N184 Chronic kidney disease, stage 4 (severe): Secondary | ICD-10-CM | POA: Diagnosis not present

## 2016-03-02 DIAGNOSIS — R809 Proteinuria, unspecified: Secondary | ICD-10-CM | POA: Diagnosis not present

## 2016-03-02 DIAGNOSIS — N189 Chronic kidney disease, unspecified: Secondary | ICD-10-CM | POA: Diagnosis not present

## 2016-03-02 DIAGNOSIS — N400 Enlarged prostate without lower urinary tract symptoms: Secondary | ICD-10-CM | POA: Diagnosis not present

## 2016-03-02 DIAGNOSIS — D631 Anemia in chronic kidney disease: Secondary | ICD-10-CM | POA: Diagnosis not present

## 2016-03-02 DIAGNOSIS — N2581 Secondary hyperparathyroidism of renal origin: Secondary | ICD-10-CM | POA: Diagnosis not present

## 2016-03-02 DIAGNOSIS — I129 Hypertensive chronic kidney disease with stage 1 through stage 4 chronic kidney disease, or unspecified chronic kidney disease: Secondary | ICD-10-CM | POA: Diagnosis not present

## 2016-03-02 DIAGNOSIS — E785 Hyperlipidemia, unspecified: Secondary | ICD-10-CM | POA: Diagnosis not present

## 2016-03-21 DIAGNOSIS — E291 Testicular hypofunction: Secondary | ICD-10-CM | POA: Diagnosis not present

## 2016-04-13 DIAGNOSIS — M109 Gout, unspecified: Secondary | ICD-10-CM | POA: Diagnosis not present

## 2016-04-13 DIAGNOSIS — E1129 Type 2 diabetes mellitus with other diabetic kidney complication: Secondary | ICD-10-CM | POA: Diagnosis not present

## 2016-04-13 DIAGNOSIS — Z125 Encounter for screening for malignant neoplasm of prostate: Secondary | ICD-10-CM | POA: Diagnosis not present

## 2016-04-13 DIAGNOSIS — I10 Essential (primary) hypertension: Secondary | ICD-10-CM | POA: Diagnosis not present

## 2016-04-13 DIAGNOSIS — E784 Other hyperlipidemia: Secondary | ICD-10-CM | POA: Diagnosis not present

## 2016-04-13 DIAGNOSIS — E291 Testicular hypofunction: Secondary | ICD-10-CM | POA: Diagnosis not present

## 2016-04-13 DIAGNOSIS — E038 Other specified hypothyroidism: Secondary | ICD-10-CM | POA: Diagnosis not present

## 2016-04-20 DIAGNOSIS — Z6841 Body Mass Index (BMI) 40.0 and over, adult: Secondary | ICD-10-CM | POA: Diagnosis not present

## 2016-04-20 DIAGNOSIS — E038 Other specified hypothyroidism: Secondary | ICD-10-CM | POA: Diagnosis not present

## 2016-04-20 DIAGNOSIS — I10 Essential (primary) hypertension: Secondary | ICD-10-CM | POA: Diagnosis not present

## 2016-04-20 DIAGNOSIS — Z0000 Encounter for general adult medical examination without abnormal findings: Secondary | ICD-10-CM | POA: Diagnosis not present

## 2016-04-20 DIAGNOSIS — N184 Chronic kidney disease, stage 4 (severe): Secondary | ICD-10-CM | POA: Diagnosis not present

## 2016-04-20 DIAGNOSIS — E291 Testicular hypofunction: Secondary | ICD-10-CM | POA: Diagnosis not present

## 2016-04-20 DIAGNOSIS — E1129 Type 2 diabetes mellitus with other diabetic kidney complication: Secondary | ICD-10-CM | POA: Diagnosis not present

## 2016-04-20 DIAGNOSIS — Z794 Long term (current) use of insulin: Secondary | ICD-10-CM | POA: Diagnosis not present

## 2016-04-20 DIAGNOSIS — G4733 Obstructive sleep apnea (adult) (pediatric): Secondary | ICD-10-CM | POA: Diagnosis not present

## 2016-04-21 DIAGNOSIS — G4733 Obstructive sleep apnea (adult) (pediatric): Secondary | ICD-10-CM | POA: Diagnosis not present

## 2016-04-25 DIAGNOSIS — E291 Testicular hypofunction: Secondary | ICD-10-CM | POA: Diagnosis not present

## 2016-05-16 DIAGNOSIS — E291 Testicular hypofunction: Secondary | ICD-10-CM | POA: Diagnosis not present

## 2016-05-30 DIAGNOSIS — Z23 Encounter for immunization: Secondary | ICD-10-CM | POA: Diagnosis not present

## 2016-05-30 DIAGNOSIS — E291 Testicular hypofunction: Secondary | ICD-10-CM | POA: Diagnosis not present

## 2016-06-08 DIAGNOSIS — N400 Enlarged prostate without lower urinary tract symptoms: Secondary | ICD-10-CM | POA: Diagnosis not present

## 2016-06-08 DIAGNOSIS — E1129 Type 2 diabetes mellitus with other diabetic kidney complication: Secondary | ICD-10-CM | POA: Diagnosis not present

## 2016-06-08 DIAGNOSIS — M109 Gout, unspecified: Secondary | ICD-10-CM | POA: Diagnosis not present

## 2016-06-08 DIAGNOSIS — N2581 Secondary hyperparathyroidism of renal origin: Secondary | ICD-10-CM | POA: Diagnosis not present

## 2016-06-08 DIAGNOSIS — D631 Anemia in chronic kidney disease: Secondary | ICD-10-CM | POA: Diagnosis not present

## 2016-06-08 DIAGNOSIS — E785 Hyperlipidemia, unspecified: Secondary | ICD-10-CM | POA: Diagnosis not present

## 2016-06-08 DIAGNOSIS — I129 Hypertensive chronic kidney disease with stage 1 through stage 4 chronic kidney disease, or unspecified chronic kidney disease: Secondary | ICD-10-CM | POA: Diagnosis not present

## 2016-06-08 DIAGNOSIS — R809 Proteinuria, unspecified: Secondary | ICD-10-CM | POA: Diagnosis not present

## 2016-06-08 DIAGNOSIS — N184 Chronic kidney disease, stage 4 (severe): Secondary | ICD-10-CM | POA: Diagnosis not present

## 2016-06-22 DIAGNOSIS — E291 Testicular hypofunction: Secondary | ICD-10-CM | POA: Diagnosis not present

## 2016-07-10 DIAGNOSIS — N184 Chronic kidney disease, stage 4 (severe): Secondary | ICD-10-CM | POA: Diagnosis not present

## 2016-07-12 DIAGNOSIS — E291 Testicular hypofunction: Secondary | ICD-10-CM | POA: Diagnosis not present

## 2016-08-08 DIAGNOSIS — E291 Testicular hypofunction: Secondary | ICD-10-CM | POA: Diagnosis not present

## 2016-08-24 DIAGNOSIS — E291 Testicular hypofunction: Secondary | ICD-10-CM | POA: Diagnosis not present

## 2016-08-24 DIAGNOSIS — E1129 Type 2 diabetes mellitus with other diabetic kidney complication: Secondary | ICD-10-CM | POA: Diagnosis not present

## 2016-08-24 DIAGNOSIS — Z6838 Body mass index (BMI) 38.0-38.9, adult: Secondary | ICD-10-CM | POA: Diagnosis not present

## 2016-08-24 DIAGNOSIS — N184 Chronic kidney disease, stage 4 (severe): Secondary | ICD-10-CM | POA: Diagnosis not present

## 2016-08-24 DIAGNOSIS — J309 Allergic rhinitis, unspecified: Secondary | ICD-10-CM | POA: Diagnosis not present

## 2016-08-24 DIAGNOSIS — I10 Essential (primary) hypertension: Secondary | ICD-10-CM | POA: Diagnosis not present

## 2016-08-24 DIAGNOSIS — G4733 Obstructive sleep apnea (adult) (pediatric): Secondary | ICD-10-CM | POA: Diagnosis not present

## 2016-08-29 DIAGNOSIS — E291 Testicular hypofunction: Secondary | ICD-10-CM | POA: Diagnosis not present

## 2016-09-19 DIAGNOSIS — E785 Hyperlipidemia, unspecified: Secondary | ICD-10-CM | POA: Diagnosis not present

## 2016-09-19 DIAGNOSIS — I129 Hypertensive chronic kidney disease with stage 1 through stage 4 chronic kidney disease, or unspecified chronic kidney disease: Secondary | ICD-10-CM | POA: Diagnosis not present

## 2016-09-19 DIAGNOSIS — D631 Anemia in chronic kidney disease: Secondary | ICD-10-CM | POA: Diagnosis not present

## 2016-09-19 DIAGNOSIS — N189 Chronic kidney disease, unspecified: Secondary | ICD-10-CM | POA: Diagnosis not present

## 2016-09-19 DIAGNOSIS — N2581 Secondary hyperparathyroidism of renal origin: Secondary | ICD-10-CM | POA: Diagnosis not present

## 2016-09-19 DIAGNOSIS — N400 Enlarged prostate without lower urinary tract symptoms: Secondary | ICD-10-CM | POA: Diagnosis not present

## 2016-09-19 DIAGNOSIS — E291 Testicular hypofunction: Secondary | ICD-10-CM | POA: Diagnosis not present

## 2016-09-19 DIAGNOSIS — M109 Gout, unspecified: Secondary | ICD-10-CM | POA: Diagnosis not present

## 2016-09-19 DIAGNOSIS — E1129 Type 2 diabetes mellitus with other diabetic kidney complication: Secondary | ICD-10-CM | POA: Diagnosis not present

## 2016-09-19 DIAGNOSIS — R809 Proteinuria, unspecified: Secondary | ICD-10-CM | POA: Diagnosis not present

## 2016-09-19 DIAGNOSIS — N184 Chronic kidney disease, stage 4 (severe): Secondary | ICD-10-CM | POA: Diagnosis not present

## 2016-10-12 DIAGNOSIS — E291 Testicular hypofunction: Secondary | ICD-10-CM | POA: Diagnosis not present

## 2016-11-02 DIAGNOSIS — E291 Testicular hypofunction: Secondary | ICD-10-CM | POA: Diagnosis not present

## 2016-11-28 DIAGNOSIS — E291 Testicular hypofunction: Secondary | ICD-10-CM | POA: Diagnosis not present

## 2016-12-25 DIAGNOSIS — I129 Hypertensive chronic kidney disease with stage 1 through stage 4 chronic kidney disease, or unspecified chronic kidney disease: Secondary | ICD-10-CM | POA: Diagnosis not present

## 2016-12-25 DIAGNOSIS — N184 Chronic kidney disease, stage 4 (severe): Secondary | ICD-10-CM | POA: Diagnosis not present

## 2016-12-25 DIAGNOSIS — M109 Gout, unspecified: Secondary | ICD-10-CM | POA: Diagnosis not present

## 2016-12-25 DIAGNOSIS — N2581 Secondary hyperparathyroidism of renal origin: Secondary | ICD-10-CM | POA: Diagnosis not present

## 2016-12-25 DIAGNOSIS — E1129 Type 2 diabetes mellitus with other diabetic kidney complication: Secondary | ICD-10-CM | POA: Diagnosis not present

## 2016-12-25 DIAGNOSIS — E039 Hypothyroidism, unspecified: Secondary | ICD-10-CM | POA: Diagnosis not present

## 2016-12-25 DIAGNOSIS — R809 Proteinuria, unspecified: Secondary | ICD-10-CM | POA: Diagnosis not present

## 2016-12-25 DIAGNOSIS — E785 Hyperlipidemia, unspecified: Secondary | ICD-10-CM | POA: Diagnosis not present

## 2016-12-25 DIAGNOSIS — D631 Anemia in chronic kidney disease: Secondary | ICD-10-CM | POA: Diagnosis not present

## 2017-01-02 DIAGNOSIS — E291 Testicular hypofunction: Secondary | ICD-10-CM | POA: Diagnosis not present

## 2017-01-02 DIAGNOSIS — E038 Other specified hypothyroidism: Secondary | ICD-10-CM | POA: Diagnosis not present

## 2017-01-02 DIAGNOSIS — G4733 Obstructive sleep apnea (adult) (pediatric): Secondary | ICD-10-CM | POA: Diagnosis not present

## 2017-01-02 DIAGNOSIS — Z794 Long term (current) use of insulin: Secondary | ICD-10-CM | POA: Diagnosis not present

## 2017-01-02 DIAGNOSIS — Z6841 Body Mass Index (BMI) 40.0 and over, adult: Secondary | ICD-10-CM | POA: Diagnosis not present

## 2017-01-02 DIAGNOSIS — D631 Anemia in chronic kidney disease: Secondary | ICD-10-CM | POA: Diagnosis not present

## 2017-01-02 DIAGNOSIS — E1129 Type 2 diabetes mellitus with other diabetic kidney complication: Secondary | ICD-10-CM | POA: Diagnosis not present

## 2017-01-02 DIAGNOSIS — N184 Chronic kidney disease, stage 4 (severe): Secondary | ICD-10-CM | POA: Diagnosis not present

## 2017-01-02 DIAGNOSIS — I10 Essential (primary) hypertension: Secondary | ICD-10-CM | POA: Diagnosis not present

## 2017-01-03 ENCOUNTER — Ambulatory Visit (INDEPENDENT_AMBULATORY_CARE_PROVIDER_SITE_OTHER): Payer: Medicare HMO | Admitting: Adult Health

## 2017-01-03 ENCOUNTER — Encounter: Payer: Self-pay | Admitting: Adult Health

## 2017-01-03 DIAGNOSIS — J301 Allergic rhinitis due to pollen: Secondary | ICD-10-CM | POA: Diagnosis not present

## 2017-01-03 DIAGNOSIS — G4733 Obstructive sleep apnea (adult) (pediatric): Secondary | ICD-10-CM

## 2017-01-03 NOTE — Assessment & Plan Note (Signed)
Add zyrtec At bedtime  As needed

## 2017-01-03 NOTE — Progress Notes (Signed)
@Patient  ID: David Nguyen, male    DOB: 20-Aug-1941, 76 y.o.   MRN: [id number]  Chief Complaint  Patient presents with  . Follow-up    OSA     Referring provider: [name], MD  HPI: 76 year old male followed for obstructive sleep apnea.  TEST  PSG 04/2008 >> severe obstructive sleep apnea with hypopneas , AHI 63/h, lowest desatn of 78%, longest event 52s. nocturnal cough noted. Improved with CPAP 15 cm   .01/03/2017 Follow up OSA  Patient returns for a one-year follow-up for severe sleep apnea. Patient remains on C Pap at bedtime. Patient says he is doing well on C Pap. He feels rested. He wears it every single night for at least 6-8 hours. Unable to do download today due to malfunctioned SD card. Patient denies any significant daytime sleepiness. Does complain of some nasal drainage and congestion .    Allergies  Allergen Reactions  . Penicillins     Immunization History  Administered Date(s) Administered  . Influenza Split 06/12/2011, 07/09/2013  . Influenza Whole 06/13/2010, 07/09/2012  . Influenza, High Dose Seasonal PF 07/09/2016  . Influenza-Unspecified 06/25/2015    Past Medical History:  Diagnosis Date  . Allergic rhinitis   . Diabetes mellitus   . Hyperlipidemia   . Hypertension     Tobacco History: History  Smoking Status  . Former Smoker  . Packs/day: 0.30  . Years: 3.00  . Types: Cigarettes  . Quit date: 10/09/1968  Smokeless Tobacco  . Never Used   Counseling given: Not Answered   Outpatient Encounter Prescriptions as of 01/03/2017  Medication Sig  . allopurinol (ZYLOPRIM) 100 MG tablet Take 100 mg by mouth daily.   Marland Kitchen amLODipine (NORVASC) 10 MG tablet Take 10 mg by mouth daily.    Marland Kitchen aspirin 81 MG tablet Take 81 mg by mouth daily.    . calcitRIOL (ROCALTROL) 0.25 MCG capsule Take 0.25 mcg by mouth daily.   . carvedilol (COREG) 12.5 MG tablet Take 12.5 mg by mouth 2 (two) times daily with a meal.    . cetirizine (ZYRTEC) 10 MG tablet  Take 10 mg by mouth daily.  Marland Kitchen doxazosin (CARDURA) 8 MG tablet Take 8 mg by mouth at bedtime.    . furosemide (LASIX) 40 MG tablet Take 1 1/2 tabs daily  . insulin lispro protamine-insulin lispro (HUMALOG 75/25) (75-25) 100 UNIT/ML SUSP 20 units every morning and 16 units at bedtime   . levothyroxine (SYNTHROID, LEVOTHROID) 75 MCG tablet Take 75 mcg by mouth daily.    Marland Kitchen lisinopril (PRINIVIL,ZESTRIL) 40 MG tablet Take 40 mg by mouth daily.    . methocarbamol (ROBAXIN) 500 MG tablet Take 500 mg by mouth daily.   . montelukast (SINGULAIR) 10 MG tablet Take 10 mg by mouth at bedtime.   . simvastatin (ZOCOR) 40 MG tablet Take 40 mg by mouth at bedtime.    . sodium chloride (OCEAN) 0.65 % SOLN nasal spray Place 1 spray into both nostrils as needed for congestion.  Marland Kitchen testosterone enanthate (DELATESTRYL) 200 MG/ML injection Inject into the muscle every 21 ( twenty-one) days. For IM use only    No facility-administered encounter medications on file as of 01/03/2017.      Review of Systems  Constitutional:   No  weight loss, night sweats,  Fevers, chills, fatigue, or  lassitude.  HEENT:   No headaches,  Difficulty swallowing,  Tooth/dental problems, or  Sore throat,  No sneezing, itching, ear ache,  +nasal congestion, post nasal drip,   CV:  No chest pain,  Orthopnea, PND, swelling in lower extremities, anasarca, dizziness, palpitations, syncope.   GI  No heartburn, indigestion, abdominal pain, nausea, vomiting, diarrhea, change in bowel habits, loss of appetite, bloody stools.   Resp: No shortness of breath with exertion or at rest.  No excess mucus, no productive cough,  No non-productive cough,  No coughing up of blood.  No change in color of mucus.  No wheezing.  No chest wall deformity  Skin: no rash or lesions.  GU: no dysuria, change in color of urine, no urgency or frequency.  No flank pain, no hematuria   MS:  No joint pain or swelling.  No decreased range of motion.  No  back pain.    Physical Exam  BP 124/64 (BP Location: Left Arm, Cuff Size: Large)   Pulse (!) 51   Ht 6' (1.829 m)   Wt 276 lb 1.6 oz (125.2 kg)   SpO2 98%   BMI 37.45 kg/m   GEN: A/Ox3; pleasant , NAD, obese    HEENT:  [place]/AT,  EACs-clear, TMs-wnl, NOSE-clear, THROAT-clear, no lesions, no postnasal drip or exudate noted. Class 2-3 MP airway   NECK:  Supple w/ fair ROM; no JVD; normal carotid impulses w/o bruits; no thyromegaly or nodules palpated; no lymphadenopathy.    RESP  Clear  P & A; w/o, wheezes/ rales/ or rhonchi. no accessory muscle use, no dullness to percussion  CARD:  RRR, no m/r/g, no peripheral edema, pulses intact, no cyanosis or clubbing.  GI:   Soft & nt; nml bowel sounds; no organomegaly or masses detected.   Musco: Warm bil, no deformities or joint swelling noted.   Neuro: alert, no focal deficits noted.    Skin: Warm, no lesions or rashes    Lab Results:  CBC No results found for: WBC, RBC, HGB, HCT, PLT, MCV, MCH, MCHC, RDW, LYMPHSABS, MONOABS, EOSABS, BASOSABS  BMET No results found for: NA, K, CL, CO2, GLUCOSE, BUN, CREATININE, CALCIUM, GFRNONAA, GFRAA  BNP No results found for: BNP  ProBNP No results found for: PROBNP  Imaging: No results found.   Assessment & Plan:   Sleep apnea OSA compensated on CPAP  Check download   Plan  Patient Instructions  Continue on C Pap at bedtime Continue to work on weight loss Do not drive if  sleepy C Pap download requested. follow up Dr. Elsworth Soho  In 1 year and As needed       Allergic rhinitis Add zyrtec At bedtime  As needed        Rexene Edison, NP 01/03/2017

## 2017-01-03 NOTE — Patient Instructions (Signed)
Continue on C Pap at bedtime Continue to work on weight loss Do not drive if  sleepy C Pap download requested. follow up Dr. Elsworth Soho  In 1 year and As needed

## 2017-01-03 NOTE — Assessment & Plan Note (Addendum)
OSA compensated on CPAP  Check download   Plan  Patient Instructions  Continue on C Pap at bedtime Continue to work on weight loss Do not drive if  sleepy C Pap download requested. follow up Dr. Elsworth Soho  In 1 year and As needed

## 2017-01-09 DIAGNOSIS — E291 Testicular hypofunction: Secondary | ICD-10-CM | POA: Diagnosis not present

## 2017-01-11 NOTE — Progress Notes (Signed)
Reviewed & agree with plan  

## 2017-02-09 DIAGNOSIS — E1129 Type 2 diabetes mellitus with other diabetic kidney complication: Secondary | ICD-10-CM | POA: Diagnosis not present

## 2017-02-09 DIAGNOSIS — N2581 Secondary hyperparathyroidism of renal origin: Secondary | ICD-10-CM | POA: Diagnosis not present

## 2017-02-09 DIAGNOSIS — N184 Chronic kidney disease, stage 4 (severe): Secondary | ICD-10-CM | POA: Diagnosis not present

## 2017-02-09 DIAGNOSIS — E785 Hyperlipidemia, unspecified: Secondary | ICD-10-CM | POA: Diagnosis not present

## 2017-02-09 DIAGNOSIS — R809 Proteinuria, unspecified: Secondary | ICD-10-CM | POA: Diagnosis not present

## 2017-02-09 DIAGNOSIS — M109 Gout, unspecified: Secondary | ICD-10-CM | POA: Diagnosis not present

## 2017-02-09 DIAGNOSIS — N400 Enlarged prostate without lower urinary tract symptoms: Secondary | ICD-10-CM | POA: Diagnosis not present

## 2017-02-09 DIAGNOSIS — D631 Anemia in chronic kidney disease: Secondary | ICD-10-CM | POA: Diagnosis not present

## 2017-02-09 DIAGNOSIS — N189 Chronic kidney disease, unspecified: Secondary | ICD-10-CM | POA: Diagnosis not present

## 2017-02-09 DIAGNOSIS — I129 Hypertensive chronic kidney disease with stage 1 through stage 4 chronic kidney disease, or unspecified chronic kidney disease: Secondary | ICD-10-CM | POA: Diagnosis not present

## 2017-02-13 DIAGNOSIS — E291 Testicular hypofunction: Secondary | ICD-10-CM | POA: Diagnosis not present

## 2017-02-14 DIAGNOSIS — G4733 Obstructive sleep apnea (adult) (pediatric): Secondary | ICD-10-CM | POA: Diagnosis not present

## 2017-03-13 DIAGNOSIS — E291 Testicular hypofunction: Secondary | ICD-10-CM | POA: Diagnosis not present

## 2017-04-12 DIAGNOSIS — E298 Other testicular dysfunction: Secondary | ICD-10-CM | POA: Diagnosis not present

## 2017-05-08 DIAGNOSIS — N529 Male erectile dysfunction, unspecified: Secondary | ICD-10-CM | POA: Diagnosis not present

## 2017-05-08 DIAGNOSIS — Z125 Encounter for screening for malignant neoplasm of prostate: Secondary | ICD-10-CM | POA: Diagnosis not present

## 2017-05-08 DIAGNOSIS — M109 Gout, unspecified: Secondary | ICD-10-CM | POA: Diagnosis not present

## 2017-05-08 DIAGNOSIS — E1129 Type 2 diabetes mellitus with other diabetic kidney complication: Secondary | ICD-10-CM | POA: Diagnosis not present

## 2017-05-08 DIAGNOSIS — E784 Other hyperlipidemia: Secondary | ICD-10-CM | POA: Diagnosis not present

## 2017-05-08 DIAGNOSIS — I10 Essential (primary) hypertension: Secondary | ICD-10-CM | POA: Diagnosis not present

## 2017-05-15 DIAGNOSIS — I129 Hypertensive chronic kidney disease with stage 1 through stage 4 chronic kidney disease, or unspecified chronic kidney disease: Secondary | ICD-10-CM | POA: Diagnosis not present

## 2017-05-15 DIAGNOSIS — E291 Testicular hypofunction: Secondary | ICD-10-CM | POA: Diagnosis not present

## 2017-05-15 DIAGNOSIS — E785 Hyperlipidemia, unspecified: Secondary | ICD-10-CM | POA: Diagnosis not present

## 2017-05-15 DIAGNOSIS — Z794 Long term (current) use of insulin: Secondary | ICD-10-CM | POA: Diagnosis not present

## 2017-05-15 DIAGNOSIS — M109 Gout, unspecified: Secondary | ICD-10-CM | POA: Diagnosis not present

## 2017-05-15 DIAGNOSIS — D631 Anemia in chronic kidney disease: Secondary | ICD-10-CM | POA: Diagnosis not present

## 2017-05-15 DIAGNOSIS — N184 Chronic kidney disease, stage 4 (severe): Secondary | ICD-10-CM | POA: Diagnosis not present

## 2017-05-15 DIAGNOSIS — R809 Proteinuria, unspecified: Secondary | ICD-10-CM | POA: Diagnosis not present

## 2017-05-15 DIAGNOSIS — N2581 Secondary hyperparathyroidism of renal origin: Secondary | ICD-10-CM | POA: Diagnosis not present

## 2017-05-15 DIAGNOSIS — E1129 Type 2 diabetes mellitus with other diabetic kidney complication: Secondary | ICD-10-CM | POA: Diagnosis not present

## 2017-05-15 DIAGNOSIS — I519 Heart disease, unspecified: Secondary | ICD-10-CM | POA: Diagnosis not present

## 2017-05-15 DIAGNOSIS — I10 Essential (primary) hypertension: Secondary | ICD-10-CM | POA: Diagnosis not present

## 2017-05-15 DIAGNOSIS — Z0000 Encounter for general adult medical examination without abnormal findings: Secondary | ICD-10-CM | POA: Diagnosis not present

## 2017-05-15 DIAGNOSIS — Z6841 Body Mass Index (BMI) 40.0 and over, adult: Secondary | ICD-10-CM | POA: Diagnosis not present

## 2017-05-29 DIAGNOSIS — E291 Testicular hypofunction: Secondary | ICD-10-CM | POA: Diagnosis not present

## 2017-06-14 DIAGNOSIS — E291 Testicular hypofunction: Secondary | ICD-10-CM | POA: Diagnosis not present

## 2017-07-03 DIAGNOSIS — E298 Other testicular dysfunction: Secondary | ICD-10-CM | POA: Diagnosis not present

## 2017-07-03 DIAGNOSIS — E038 Other specified hypothyroidism: Secondary | ICD-10-CM | POA: Diagnosis not present

## 2017-07-03 DIAGNOSIS — Z23 Encounter for immunization: Secondary | ICD-10-CM | POA: Diagnosis not present

## 2017-07-03 DIAGNOSIS — E291 Testicular hypofunction: Secondary | ICD-10-CM | POA: Diagnosis not present

## 2017-07-10 ENCOUNTER — Telehealth: Payer: Self-pay | Admitting: Pulmonary Disease

## 2017-07-10 DIAGNOSIS — G4733 Obstructive sleep apnea (adult) (pediatric): Secondary | ICD-10-CM

## 2017-07-10 NOTE — Telephone Encounter (Signed)
lmtcb X1 for pt  

## 2017-07-11 NOTE — Telephone Encounter (Signed)
Patient is returning phone call.  °

## 2017-07-11 NOTE — Telephone Encounter (Signed)
Called spoke with patient who reported his current machine is not working at all and is requesting an order for a new one - reports his machine is about 52-78 years old and spoken with his insurance company who reported that they will pay for a new machine.  Patient was last seen 3.28.18 by TP, follow up in 1 year Order sent for new CPAP machine  Nothing further needed; will sign off

## 2017-07-11 NOTE — Telephone Encounter (Signed)
lmtcb x2 for pt. 

## 2017-07-11 NOTE — Telephone Encounter (Signed)
Patient called back, confirmed contact # as: [phone]

## 2017-07-12 ENCOUNTER — Telehealth: Payer: Self-pay | Admitting: Pulmonary Disease

## 2017-07-12 NOTE — Telephone Encounter (Signed)
Spoke with Corene Cornea at [facility], states that pt has not been seen in over 6 mos, will need an ov documenting cpap usage/benefits/compliance in order for pt to receive a new cpap. Called and spoke with pt, scheduled to see TP tomorrow afternoon.  Nothing further needed.

## 2017-07-13 ENCOUNTER — Encounter: Payer: Self-pay | Admitting: Adult Health

## 2017-07-13 ENCOUNTER — Ambulatory Visit (INDEPENDENT_AMBULATORY_CARE_PROVIDER_SITE_OTHER): Payer: Medicare HMO | Admitting: Adult Health

## 2017-07-13 DIAGNOSIS — G4733 Obstructive sleep apnea (adult) (pediatric): Secondary | ICD-10-CM

## 2017-07-13 NOTE — Assessment & Plan Note (Signed)
Wt loss  

## 2017-07-13 NOTE — Assessment & Plan Note (Signed)
OSA - unable to wear CPAP due to machine is broken  Order for new machine .   Plan  Patient Instructions  Order for new CPAP machine .  Continue on C Pap at bedtime Continue to work on weight loss Do not drive if  sleepy Follow up with Dr. Elsworth Soho  In 1 year and As needed

## 2017-07-13 NOTE — Progress Notes (Addendum)
@Patient  ID: David Nguyen, male    DOB: 28-Sep-1941, 76 y.o.   MRN: [id number]  Chief Complaint  Patient presents with  . Follow-up    OSA    Referring provider: [name], MD  HPI: 76 year old male followed for obstructive sleep apnea.  TEST  PSG 04/2008 >>severe obstructive sleep apnea with hypopneas , AHI 63/h, lowest desatn of 78%, longest event 52s. nocturnal cough noted. Improved with CPAP 15 cm    07/13/2017 Follow up : OSA  Patient returns for a follow-up for sleep apnea.  Pt has severe OSA , wears every night but machine  stopped working and needs new machine.  Feels tired during daytime now that it is not working . He wants to get new machine as soon as possible.  When he was wearing CPAP , he felt rested .  We discussed wt loss.  Flu shot is utd.  MACHINE IS BROKEN BEYOND REPAIR    Allergies  Allergen Reactions  . Penicillins     Immunization History  Administered Date(s) Administered  . Influenza Split 06/12/2011, 07/09/2013  . Influenza Whole 06/13/2010, 07/09/2012  . Influenza, High Dose Seasonal PF 07/09/2016, 06/29/2017  . Influenza-Unspecified 06/25/2015    Past Medical History:  Diagnosis Date  . Allergic rhinitis   . Diabetes mellitus   . Hyperlipidemia   . Hypertension     Tobacco History: History  Smoking Status  . Former Smoker  . Packs/day: 0.30  . Years: 3.00  . Types: Cigarettes  . Quit date: 10/09/1968  Smokeless Tobacco  . Never Used   Counseling given: Not Answered   Outpatient Encounter Prescriptions as of 07/13/2017  Medication Sig  . allopurinol (ZYLOPRIM) 100 MG tablet Take 100 mg by mouth daily.   Marland Kitchen amLODipine (NORVASC) 10 MG tablet Take 10 mg by mouth daily.    Marland Kitchen aspirin 81 MG tablet Take 81 mg by mouth daily.    . calcitRIOL (ROCALTROL) 0.25 MCG capsule Take 0.25 mcg by mouth daily.   . carvedilol (COREG) 12.5 MG tablet Take 12.5 mg by mouth 2 (two) times daily with a meal.    . cetirizine (ZYRTEC) 10 MG  tablet Take 10 mg by mouth daily.  Marland Kitchen doxazosin (CARDURA) 8 MG tablet Take 8 mg by mouth at bedtime.    . furosemide (LASIX) 40 MG tablet Take 1 1/2 tabs daily  . insulin lispro protamine-insulin lispro (HUMALOG 75/25) (75-25) 100 UNIT/ML SUSP 20 units every morning and 16 units at bedtime   . levothyroxine (SYNTHROID, LEVOTHROID) 75 MCG tablet Take 75 mcg by mouth daily.    Marland Kitchen lisinopril (PRINIVIL,ZESTRIL) 40 MG tablet Take 40 mg by mouth daily.    . methocarbamol (ROBAXIN) 500 MG tablet Take 500 mg by mouth daily.   . montelukast (SINGULAIR) 10 MG tablet Take 10 mg by mouth at bedtime.   . simvastatin (ZOCOR) 40 MG tablet Take 40 mg by mouth at bedtime.    . sodium chloride (OCEAN) 0.65 % SOLN nasal spray Place 1 spray into both nostrils as needed for congestion.  Marland Kitchen testosterone enanthate (DELATESTRYL) 200 MG/ML injection Inject into the muscle every 21 ( twenty-one) days. For IM use only    No facility-administered encounter medications on file as of 07/13/2017.      Review of Systems  Constitutional:   No  weight loss, night sweats,  Fevers, chills, fatigue, or  lassitude.  HEENT:   No headaches,  Difficulty swallowing,  Tooth/dental problems, or  Sore throat,  No sneezing, itching, ear ache, nasal congestion, post nasal drip,   CV:  No chest pain,  Orthopnea, PND, swelling in lower extremities, anasarca, dizziness, palpitations, syncope.   GI  No heartburn, indigestion, abdominal pain, nausea, vomiting, diarrhea, change in bowel habits, loss of appetite, bloody stools.   Resp: No shortness of breath with exertion or at rest.  No excess mucus, no productive cough,  No non-productive cough,  No coughing up of blood.  No change in color of mucus.  No wheezing.  No chest wall deformity  Skin: no rash or lesions.  GU: no dysuria, change in color of urine, no urgency or frequency.  No flank pain, no hematuria   MS:  No joint pain or swelling.  No decreased range of motion.   No back pain.    Physical Exam  BP 126/76 (BP Location: Left Arm, Cuff Size: Normal)   Pulse 63   Ht 6' (1.829 m)   Wt 274 lb 12.8 oz (124.6 kg)   SpO2 96%   BMI 37.27 kg/m   GEN: A/Ox3; pleasant , NAD, obese    HEENT:  [place]/AT,  EACs-clear, TMs-wnl, NOSE-clear, THROAT-clear, no lesions, no postnasal drip or exudate noted. Class 2-3 MP airway   NECK:  Supple w/ fair ROM; no JVD; normal carotid impulses w/o bruits; no thyromegaly or nodules palpated; no lymphadenopathy.    RESP  Clear  P & A; w/o, wheezes/ rales/ or rhonchi. no accessory muscle use, no dullness to percussion  CARD:  RRR, no m/r/g, tr peripheral edema, pulses intact, no cyanosis or clubbing.  GI:   Soft & nt; nml bowel sounds; no organomegaly or masses detected.   Musco: Warm bil, no deformities or joint swelling noted.   Neuro: alert, no focal deficits noted.    Skin: Warm, no lesions or rashes    Lab Results:  CBC No results found for: WBC, RBC, HGB, HCT, PLT, MCV, MCH, MCHC, RDW, LYMPHSABS, MONOABS, EOSABS, BASOSABS  BMET No results found for: NA, K, CL, CO2, GLUCOSE, BUN, CREATININE, CALCIUM, GFRNONAA, GFRAA  BNP No results found for: BNP  ProBNP No results found for: PROBNP  Imaging: No results found.   Assessment & Plan:   No problem-specific Assessment & Plan notes found for this encounter.     Rexene Edison, NP 07/13/2017

## 2017-07-13 NOTE — Patient Instructions (Signed)
Order for new CPAP machine .  Continue on C Pap at bedtime Continue to work on weight loss Do not drive if  sleepy Follow up with Dr. Elsworth Soho  In 1 year and As needed

## 2017-07-17 DIAGNOSIS — E291 Testicular hypofunction: Secondary | ICD-10-CM | POA: Diagnosis not present

## 2017-07-23 ENCOUNTER — Telehealth: Payer: Self-pay | Admitting: Pulmonary Disease

## 2017-07-23 DIAGNOSIS — G4733 Obstructive sleep apnea (adult) (pediatric): Secondary | ICD-10-CM

## 2017-07-23 NOTE — Telephone Encounter (Signed)
Spoke with patient. He said that the order needs to go [facility], not [facility]. Advised patient that I would cancel the order with [facility] and sent the correct order to [place].   He verbalized understanding.   Nothing further needed at time of call.

## 2017-07-23 NOTE — Telephone Encounter (Signed)
Patient called and states he doesn't use [facility] - he uses [facility] -pt can be reached at [phone]

## 2017-07-23 NOTE — Telephone Encounter (Signed)
Order placed on 10/3 for CPAP.   Pt seen by TP as the order requested on 07/13/17. PCC's please advise.  Thanks.

## 2017-07-23 NOTE — Telephone Encounter (Signed)
I have sent  A message to [facility] to see where we are at on this.

## 2017-07-24 ENCOUNTER — Telehealth: Payer: Self-pay | Admitting: Adult Health

## 2017-07-24 DIAGNOSIS — E298 Other testicular dysfunction: Secondary | ICD-10-CM | POA: Diagnosis not present

## 2017-07-24 NOTE — Telephone Encounter (Signed)
TP please advise if you and addend the last OV note for the pt and state that the cpap machine is broken beyond repair so the insurance will cover the new cpap machine.  Thanks

## 2017-07-24 NOTE — Telephone Encounter (Signed)
Per TP, note has been addended

## 2017-07-25 NOTE — Telephone Encounter (Signed)
addended note has been faxed to [facility] [place] at the fax number given.

## 2017-08-01 DIAGNOSIS — G4733 Obstructive sleep apnea (adult) (pediatric): Secondary | ICD-10-CM | POA: Diagnosis not present

## 2017-08-20 DIAGNOSIS — N184 Chronic kidney disease, stage 4 (severe): Secondary | ICD-10-CM | POA: Diagnosis not present

## 2017-08-20 DIAGNOSIS — E785 Hyperlipidemia, unspecified: Secondary | ICD-10-CM | POA: Diagnosis not present

## 2017-08-20 DIAGNOSIS — N2581 Secondary hyperparathyroidism of renal origin: Secondary | ICD-10-CM | POA: Diagnosis not present

## 2017-08-20 DIAGNOSIS — R809 Proteinuria, unspecified: Secondary | ICD-10-CM | POA: Diagnosis not present

## 2017-08-20 DIAGNOSIS — E1122 Type 2 diabetes mellitus with diabetic chronic kidney disease: Secondary | ICD-10-CM | POA: Diagnosis not present

## 2017-08-20 DIAGNOSIS — I129 Hypertensive chronic kidney disease with stage 1 through stage 4 chronic kidney disease, or unspecified chronic kidney disease: Secondary | ICD-10-CM | POA: Diagnosis not present

## 2017-08-20 DIAGNOSIS — M109 Gout, unspecified: Secondary | ICD-10-CM | POA: Diagnosis not present

## 2017-08-20 DIAGNOSIS — D631 Anemia in chronic kidney disease: Secondary | ICD-10-CM | POA: Diagnosis not present

## 2017-08-21 DIAGNOSIS — E039 Hypothyroidism, unspecified: Secondary | ICD-10-CM | POA: Diagnosis not present

## 2017-08-24 DIAGNOSIS — G4733 Obstructive sleep apnea (adult) (pediatric): Secondary | ICD-10-CM | POA: Diagnosis not present

## 2017-09-01 DIAGNOSIS — G4733 Obstructive sleep apnea (adult) (pediatric): Secondary | ICD-10-CM | POA: Diagnosis not present

## 2017-09-11 DIAGNOSIS — E298 Other testicular dysfunction: Secondary | ICD-10-CM | POA: Diagnosis not present

## 2017-09-19 DIAGNOSIS — D631 Anemia in chronic kidney disease: Secondary | ICD-10-CM | POA: Diagnosis not present

## 2017-09-19 DIAGNOSIS — N184 Chronic kidney disease, stage 4 (severe): Secondary | ICD-10-CM | POA: Diagnosis not present

## 2017-09-19 DIAGNOSIS — I10 Essential (primary) hypertension: Secondary | ICD-10-CM | POA: Diagnosis not present

## 2017-09-19 DIAGNOSIS — E1129 Type 2 diabetes mellitus with other diabetic kidney complication: Secondary | ICD-10-CM | POA: Diagnosis not present

## 2017-09-19 DIAGNOSIS — E298 Other testicular dysfunction: Secondary | ICD-10-CM | POA: Diagnosis not present

## 2017-09-19 DIAGNOSIS — G4733 Obstructive sleep apnea (adult) (pediatric): Secondary | ICD-10-CM | POA: Diagnosis not present

## 2017-09-19 DIAGNOSIS — Z6839 Body mass index (BMI) 39.0-39.9, adult: Secondary | ICD-10-CM | POA: Diagnosis not present

## 2017-10-01 DIAGNOSIS — G4733 Obstructive sleep apnea (adult) (pediatric): Secondary | ICD-10-CM | POA: Diagnosis not present

## 2017-10-11 DIAGNOSIS — E298 Other testicular dysfunction: Secondary | ICD-10-CM | POA: Diagnosis not present

## 2017-11-01 DIAGNOSIS — G4733 Obstructive sleep apnea (adult) (pediatric): Secondary | ICD-10-CM | POA: Diagnosis not present

## 2017-11-27 DIAGNOSIS — E298 Other testicular dysfunction: Secondary | ICD-10-CM | POA: Diagnosis not present

## 2017-11-28 DIAGNOSIS — H524 Presbyopia: Secondary | ICD-10-CM | POA: Diagnosis not present

## 2017-12-02 DIAGNOSIS — G4733 Obstructive sleep apnea (adult) (pediatric): Secondary | ICD-10-CM | POA: Diagnosis not present

## 2017-12-14 DIAGNOSIS — D631 Anemia in chronic kidney disease: Secondary | ICD-10-CM | POA: Diagnosis not present

## 2017-12-14 DIAGNOSIS — E1122 Type 2 diabetes mellitus with diabetic chronic kidney disease: Secondary | ICD-10-CM | POA: Diagnosis not present

## 2017-12-14 DIAGNOSIS — I129 Hypertensive chronic kidney disease with stage 1 through stage 4 chronic kidney disease, or unspecified chronic kidney disease: Secondary | ICD-10-CM | POA: Diagnosis not present

## 2017-12-14 DIAGNOSIS — N2581 Secondary hyperparathyroidism of renal origin: Secondary | ICD-10-CM | POA: Diagnosis not present

## 2017-12-14 DIAGNOSIS — N184 Chronic kidney disease, stage 4 (severe): Secondary | ICD-10-CM | POA: Diagnosis not present

## 2017-12-28 DIAGNOSIS — E291 Testicular hypofunction: Secondary | ICD-10-CM | POA: Diagnosis not present

## 2017-12-30 DIAGNOSIS — G4733 Obstructive sleep apnea (adult) (pediatric): Secondary | ICD-10-CM | POA: Diagnosis not present

## 2018-01-15 DIAGNOSIS — E291 Testicular hypofunction: Secondary | ICD-10-CM | POA: Diagnosis not present

## 2018-01-23 DIAGNOSIS — E1129 Type 2 diabetes mellitus with other diabetic kidney complication: Secondary | ICD-10-CM | POA: Diagnosis not present

## 2018-01-23 DIAGNOSIS — Z6841 Body Mass Index (BMI) 40.0 and over, adult: Secondary | ICD-10-CM | POA: Diagnosis not present

## 2018-01-23 DIAGNOSIS — I10 Essential (primary) hypertension: Secondary | ICD-10-CM | POA: Diagnosis not present

## 2018-01-23 DIAGNOSIS — D631 Anemia in chronic kidney disease: Secondary | ICD-10-CM | POA: Diagnosis not present

## 2018-01-23 DIAGNOSIS — E291 Testicular hypofunction: Secondary | ICD-10-CM | POA: Diagnosis not present

## 2018-01-23 DIAGNOSIS — N184 Chronic kidney disease, stage 4 (severe): Secondary | ICD-10-CM | POA: Diagnosis not present

## 2018-01-23 DIAGNOSIS — Z1389 Encounter for screening for other disorder: Secondary | ICD-10-CM | POA: Diagnosis not present

## 2018-01-23 DIAGNOSIS — J3089 Other allergic rhinitis: Secondary | ICD-10-CM | POA: Diagnosis not present

## 2018-01-23 DIAGNOSIS — Z6838 Body mass index (BMI) 38.0-38.9, adult: Secondary | ICD-10-CM | POA: Diagnosis not present

## 2018-01-30 DIAGNOSIS — G4733 Obstructive sleep apnea (adult) (pediatric): Secondary | ICD-10-CM | POA: Diagnosis not present

## 2018-02-14 DIAGNOSIS — E291 Testicular hypofunction: Secondary | ICD-10-CM | POA: Diagnosis not present

## 2018-03-01 DIAGNOSIS — G4733 Obstructive sleep apnea (adult) (pediatric): Secondary | ICD-10-CM | POA: Diagnosis not present

## 2018-03-18 DIAGNOSIS — D72810 Lymphocytopenia: Secondary | ICD-10-CM | POA: Diagnosis not present

## 2018-03-18 DIAGNOSIS — I129 Hypertensive chronic kidney disease with stage 1 through stage 4 chronic kidney disease, or unspecified chronic kidney disease: Secondary | ICD-10-CM | POA: Diagnosis not present

## 2018-03-18 DIAGNOSIS — M109 Gout, unspecified: Secondary | ICD-10-CM | POA: Diagnosis not present

## 2018-03-18 DIAGNOSIS — N184 Chronic kidney disease, stage 4 (severe): Secondary | ICD-10-CM | POA: Diagnosis not present

## 2018-03-18 DIAGNOSIS — D631 Anemia in chronic kidney disease: Secondary | ICD-10-CM | POA: Diagnosis not present

## 2018-03-18 DIAGNOSIS — E785 Hyperlipidemia, unspecified: Secondary | ICD-10-CM | POA: Diagnosis not present

## 2018-03-18 DIAGNOSIS — N2581 Secondary hyperparathyroidism of renal origin: Secondary | ICD-10-CM | POA: Diagnosis not present

## 2018-03-18 DIAGNOSIS — R809 Proteinuria, unspecified: Secondary | ICD-10-CM | POA: Diagnosis not present

## 2018-03-18 DIAGNOSIS — E1122 Type 2 diabetes mellitus with diabetic chronic kidney disease: Secondary | ICD-10-CM | POA: Diagnosis not present

## 2018-03-18 DIAGNOSIS — N189 Chronic kidney disease, unspecified: Secondary | ICD-10-CM | POA: Diagnosis not present

## 2018-03-21 DIAGNOSIS — E291 Testicular hypofunction: Secondary | ICD-10-CM | POA: Diagnosis not present

## 2018-03-21 DIAGNOSIS — E1122 Type 2 diabetes mellitus with diabetic chronic kidney disease: Secondary | ICD-10-CM | POA: Diagnosis not present

## 2018-03-21 DIAGNOSIS — R600 Localized edema: Secondary | ICD-10-CM | POA: Diagnosis not present

## 2018-03-21 DIAGNOSIS — D631 Anemia in chronic kidney disease: Secondary | ICD-10-CM | POA: Diagnosis not present

## 2018-03-21 DIAGNOSIS — E1129 Type 2 diabetes mellitus with other diabetic kidney complication: Secondary | ICD-10-CM | POA: Diagnosis not present

## 2018-03-21 DIAGNOSIS — R5383 Other fatigue: Secondary | ICD-10-CM | POA: Diagnosis not present

## 2018-03-21 DIAGNOSIS — N184 Chronic kidney disease, stage 4 (severe): Secondary | ICD-10-CM | POA: Diagnosis not present

## 2018-03-21 DIAGNOSIS — Z794 Long term (current) use of insulin: Secondary | ICD-10-CM | POA: Diagnosis not present

## 2018-03-21 DIAGNOSIS — R0609 Other forms of dyspnea: Secondary | ICD-10-CM | POA: Diagnosis not present

## 2018-03-21 DIAGNOSIS — Z6841 Body Mass Index (BMI) 40.0 and over, adult: Secondary | ICD-10-CM | POA: Diagnosis not present

## 2018-03-25 DIAGNOSIS — R600 Localized edema: Secondary | ICD-10-CM | POA: Diagnosis not present

## 2018-03-25 DIAGNOSIS — R0609 Other forms of dyspnea: Secondary | ICD-10-CM | POA: Diagnosis not present

## 2018-03-25 DIAGNOSIS — N184 Chronic kidney disease, stage 4 (severe): Secondary | ICD-10-CM | POA: Diagnosis not present

## 2018-03-25 DIAGNOSIS — Z6839 Body mass index (BMI) 39.0-39.9, adult: Secondary | ICD-10-CM | POA: Diagnosis not present

## 2018-03-25 DIAGNOSIS — R5383 Other fatigue: Secondary | ICD-10-CM | POA: Diagnosis not present

## 2018-03-25 DIAGNOSIS — D631 Anemia in chronic kidney disease: Secondary | ICD-10-CM | POA: Diagnosis not present

## 2018-03-26 ENCOUNTER — Other Ambulatory Visit: Payer: Self-pay

## 2018-03-26 DIAGNOSIS — N185 Chronic kidney disease, stage 5: Secondary | ICD-10-CM

## 2018-04-01 DIAGNOSIS — G4733 Obstructive sleep apnea (adult) (pediatric): Secondary | ICD-10-CM | POA: Diagnosis not present

## 2018-04-03 ENCOUNTER — Other Ambulatory Visit (HOSPITAL_COMMUNITY): Payer: Self-pay

## 2018-04-04 ENCOUNTER — Ambulatory Visit (HOSPITAL_COMMUNITY)
Admission: RE | Admit: 2018-04-04 | Discharge: 2018-04-04 | Disposition: A | Discharge status: Home / Self Care | Payer: Medicare HMO | Source: Ambulatory Visit | Attending: Nephrology | Admitting: Nephrology

## 2018-04-04 DIAGNOSIS — N185 Chronic kidney disease, stage 5: Secondary | ICD-10-CM | POA: Insufficient documentation

## 2018-04-04 MED ORDER — SODIUM CHLORIDE 0.9 % IV SOLN
510.0000 mg | INTRAVENOUS | Status: DC
  Administered 2018-04-04: 510 mg via INTRAVENOUS
  Filled 2018-04-04: qty 17

## 2018-04-04 NOTE — Discharge Instructions (Signed)

## 2018-04-10 ENCOUNTER — Other Ambulatory Visit (HOSPITAL_COMMUNITY): Payer: Self-pay | Admitting: *Deleted

## 2018-04-12 ENCOUNTER — Ambulatory Visit (HOSPITAL_COMMUNITY)
Admission: RE | Admit: 2018-04-12 | Discharge: 2018-04-12 | Disposition: A | Discharge status: Home / Self Care | Payer: Medicare HMO | Source: Ambulatory Visit | Attending: Nephrology | Admitting: Nephrology

## 2018-04-12 DIAGNOSIS — N184 Chronic kidney disease, stage 4 (severe): Secondary | ICD-10-CM | POA: Insufficient documentation

## 2018-04-12 DIAGNOSIS — D631 Anemia in chronic kidney disease: Secondary | ICD-10-CM | POA: Insufficient documentation

## 2018-04-12 LAB — POCT HEMOGLOBIN-HEMACUE: Hemoglobin: 10.5 g/dL — ABNORMAL LOW (ref 13.0–17.0)

## 2018-04-12 MED ORDER — SODIUM CHLORIDE 0.9 % IV SOLN
510.0000 mg | Freq: Once | INTRAVENOUS | Status: AC
  Administered 2018-04-12: 510 mg via INTRAVENOUS
  Filled 2018-04-12: qty 17

## 2018-04-12 MED ORDER — EPOETIN ALFA-EPBX 40000 UNIT/ML IJ SOLN
30000.0000 [IU] | INTRAMUSCULAR | Status: DC
  Administered 2018-04-12: 30000 [IU] via SUBCUTANEOUS
  Filled 2018-04-12: qty 3

## 2018-04-15 ENCOUNTER — Encounter: Payer: Self-pay | Admitting: Nephrology

## 2018-04-15 DIAGNOSIS — N189 Chronic kidney disease, unspecified: Secondary | ICD-10-CM

## 2018-04-15 DIAGNOSIS — Z862 Personal history of diseases of the blood and blood-forming organs and certain disorders involving the immune mechanism: Secondary | ICD-10-CM | POA: Insufficient documentation

## 2018-04-15 DIAGNOSIS — N186 End stage renal disease: Secondary | ICD-10-CM | POA: Insufficient documentation

## 2018-04-15 DIAGNOSIS — Z992 Dependence on renal dialysis: Secondary | ICD-10-CM | POA: Insufficient documentation

## 2018-04-15 DIAGNOSIS — E1122 Type 2 diabetes mellitus with diabetic chronic kidney disease: Secondary | ICD-10-CM | POA: Insufficient documentation

## 2018-04-15 DIAGNOSIS — N184 Chronic kidney disease, stage 4 (severe): Secondary | ICD-10-CM

## 2018-04-17 ENCOUNTER — Ambulatory Visit (HOSPITAL_COMMUNITY)
Admission: RE | Admit: 2018-04-17 | Discharge: 2018-04-17 | Disposition: A | Discharge status: Home / Self Care | Payer: Medicare HMO | Source: Ambulatory Visit | Attending: Vascular Surgery | Admitting: Vascular Surgery

## 2018-04-17 ENCOUNTER — Ambulatory Visit (INDEPENDENT_AMBULATORY_CARE_PROVIDER_SITE_OTHER)
Admission: RE | Admit: 2018-04-17 | Discharge: 2018-04-17 | Disposition: A | Discharge status: Home / Self Care | Payer: Medicare HMO | Source: Ambulatory Visit | Attending: Vascular Surgery | Admitting: Vascular Surgery

## 2018-04-17 DIAGNOSIS — N185 Chronic kidney disease, stage 5: Secondary | ICD-10-CM | POA: Insufficient documentation

## 2018-04-30 ENCOUNTER — Other Ambulatory Visit: Payer: Self-pay | Admitting: *Deleted

## 2018-04-30 ENCOUNTER — Other Ambulatory Visit (HOSPITAL_COMMUNITY): Payer: Medicare HMO

## 2018-04-30 ENCOUNTER — Other Ambulatory Visit: Payer: Self-pay

## 2018-04-30 ENCOUNTER — Encounter: Payer: Self-pay | Admitting: *Deleted

## 2018-04-30 ENCOUNTER — Ambulatory Visit: Payer: Medicare HMO | Admitting: Vascular Surgery

## 2018-04-30 ENCOUNTER — Encounter (HOSPITAL_COMMUNITY): Payer: Medicare HMO

## 2018-04-30 ENCOUNTER — Encounter: Payer: Self-pay | Admitting: Vascular Surgery

## 2018-04-30 VITALS — BP 159/79 | HR 69 | Resp 20 | Ht 71.0 in | Wt 266.2 lb

## 2018-04-30 DIAGNOSIS — N185 Chronic kidney disease, stage 5: Secondary | ICD-10-CM

## 2018-04-30 NOTE — H&P (View-Only) (Signed)
[facility]ist of [place]  Patient name: David Nguyen MRN: [id number] DOB: [birth year] Sex: male  REASON FOR CONSULT: Discuss access for hemodialysis  HPI: David Nguyen is a 77 y.o. male, who is here today with his wife for discussion of access for hemodialysis.  He has progressive renal insufficiency is in approaching need for hemodialysis.  This is felt related to chronic diabetes.  He has never had access is never had pacemaker.  He is right-handed.  Past Medical History:  Diagnosis Date  . Allergic rhinitis   . Chronic kidney disease   . Diabetes mellitus   . Hyperlipidemia   . Hypertension     Family History  Problem Relation Age of Onset  . Prostate cancer Father   . Kidney failure Mother   . Lung cancer Sister     SOCIAL HISTORY: Social History   Socioeconomic History  . Marital status: Divorced    Spouse name: Not on file  . Number of children: 5  . Years of education: Not on file  . Highest education level: Not on file  Occupational History    Comment: [employer]  Social Needs  . Financial resource strain: Not on file  . Food insecurity:    Worry: Not on file    Inability: Not on file  . Transportation needs:    Medical: Not on file    Non-medical: Not on file  Tobacco Use  . Smoking status: Former Smoker    Packs/day: 0.30    Years: 3.00    Pack years: 0.90    Types: Cigarettes    Last attempt to quit: 10/09/1968    Years since quitting: 49.5  . Smokeless tobacco: Never Used  Substance and Sexual Activity  . Alcohol use: No  . Drug use: No  . Sexual activity: Not on file  Lifestyle  . Physical activity:    Days per week: Not on file    Minutes per session: Not on file  . Stress: Not on file  Relationships  . Social connections:    Talks on phone: Not on file    Gets together: Not on file    Attends religious service: Not on file    Active member of club or organization: Not on file   Attends meetings of clubs or organizations: Not on file    Relationship status: Not on file  . Intimate partner violence:    Fear of current or ex partner: Not on file    Emotionally abused: Not on file    Physically abused: Not on file    Forced sexual activity: Not on file  Other Topics Concern  . Not on file  Social History Narrative  . Not on file    Allergies  Allergen Reactions  . Penicillins     Current Outpatient Medications  Medication Sig Dispense Refill  . allopurinol (ZYLOPRIM) 100 MG tablet Take 100 mg by mouth daily.     Marland Kitchen amLODipine (NORVASC) 10 MG tablet Take 10 mg by mouth daily.      Marland Kitchen aspirin 81 MG tablet Take 81 mg by mouth daily.      . calcitRIOL (ROCALTROL) 0.25 MCG capsule Take 0.25 mcg by mouth daily.     . carvedilol (COREG) 12.5 MG tablet Take 12.5 mg by mouth 2 (two) times daily with a meal.      . cetirizine (ZYRTEC) 10 MG tablet Take 10 mg by mouth daily.    Marland Kitchen doxazosin (  CARDURA) 8 MG tablet Take 8 mg by mouth at bedtime.      . furosemide (LASIX) 40 MG tablet Take 1 1/2 tabs daily    . insulin lispro protamine-insulin lispro (HUMALOG 75/25) (75-25) 100 UNIT/ML SUSP 20 units every morning and 16 units at bedtime     . levothyroxine (SYNTHROID, LEVOTHROID) 75 MCG tablet Take 75 mcg by mouth daily.      Marland Kitchen lisinopril (PRINIVIL,ZESTRIL) 40 MG tablet Take 40 mg by mouth daily.      . methocarbamol (ROBAXIN) 500 MG tablet Take 500 mg by mouth daily.     . montelukast (SINGULAIR) 10 MG tablet Take 10 mg by mouth at bedtime.     . simvastatin (ZOCOR) 40 MG tablet Take 40 mg by mouth at bedtime.      . sodium chloride (OCEAN) 0.65 % SOLN nasal spray Place 1 spray into both nostrils as needed for congestion.    Marland Kitchen testosterone enanthate (DELATESTRYL) 200 MG/ML injection Inject into the muscle every 21 ( twenty-one) days. For IM use only      No current facility-administered medications for this visit.     REVIEW OF SYSTEMS:  [X]  denotes positive finding, [  ] denotes negative finding Cardiac  Comments:  Chest pain or chest pressure:    Shortness of breath upon exertion:    Short of breath when lying flat:    Irregular heart rhythm:        Vascular    Pain in calf, thigh, or hip brought on by ambulation:    Pain in feet at night that wakes you up from your sleep:     Blood clot in your veins:    Leg swelling:  x       Pulmonary    Oxygen at home:    Productive cough:     Wheezing:         Neurologic    Sudden weakness in arms or legs:     Sudden numbness in arms or legs:     Sudden onset of difficulty speaking or slurred speech:    Temporary loss of vision in one eye:     Problems with dizziness:         Gastrointestinal    Blood in stool:     Vomited blood:         Genitourinary    Burning when urinating:     Blood in urine:        Psychiatric    Major depression:         Hematologic    Bleeding problems:    Problems with blood clotting too easily:        Skin    Rashes or ulcers:        Constitutional    Fever or chills:      PHYSICAL EXAM: Vitals:   04/30/18 1355 04/30/18 1358  BP: (!) 155/77 (!) 159/79  Pulse: 69   Resp: 20   SpO2: 98%   Weight: 266 lb 3.2 oz (120.7 kg)   Height: 5\' 11"  (1.803 m)     GENERAL: The patient is a well-nourished male, in no acute distress. The vital signs are documented above. CARDIOVASCULAR: 2+ radial pulses bilaterally.  Small surface veins bilaterally.  Carotid arteries without bruits bilaterally PULMONARY: There is good air exchange  ABDOMEN: Soft and non-tender  MUSCULOSKELETAL: There are no major deformities or cyanosis. NEUROLOGIC: No focal weakness or paresthesias are detected. SKIN: There are no ulcers  or rashes noted. PSYCHIATRIC: The patient has a normal affect.  DATA:  Noninvasive studies revealed normal triphasic wearing arms and his radial and ulnar arteries bilaterally.  Upper extremity vein map show small cephalic veins bilaterally.  Moderate left upper  basilic vein size  MEDICAL ISSUES: I had a very long discussion with the patient and his wife present.  Discussed options to include catheter for acute hemodialysis, AV fistula and AV graft placement.  Described the advantages and benefits and disadvantages of each of these.  He does have what appears to be adequate basilic vein on the left for attempted fistula.  I have recommended basilic vein to brachial artery anastomosis as a first stage and then transposition if he has dilatation of his vein.  Scribed the procedure as an outpatient at [facility].  Patient reports that they he refuses blood products from a religious standpoint.  Explained that this would certainly be honored that it would be exceedingly rare to need transfusion with fistula access.  We will schedule this at his earliest convenience as an outpatient   [name], MD [facility] Specialists of [facility] Tel [phone] Pager [phone]

## 2018-04-30 NOTE — Progress Notes (Signed)
Vascular and Vein Specialist of [place]  Patient name: David Nguyen MRN: [id number] DOB: 1941/06/17 Sex: male  REASON FOR CONSULT: Discuss access for hemodialysis  HPI: David Nguyen is a 77 y.o. male, who is here today with his wife for discussion of access for hemodialysis.  He has progressive renal insufficiency is in approaching need for hemodialysis.  This is felt related to chronic diabetes.  He has never had access is never had pacemaker.  He is right-handed.  Past Medical History:  Diagnosis Date  . Allergic rhinitis   . Chronic kidney disease   . Diabetes mellitus   . Hyperlipidemia   . Hypertension     Family History  Problem Relation Age of Onset  . Prostate cancer Father   . Kidney failure Mother   . Lung cancer Sister     SOCIAL HISTORY: Social History   Socioeconomic History  . Marital status: Divorced    Spouse name: Not on file  . Number of children: 5  . Years of education: Not on file  . Highest education level: Not on file  Occupational History    Comment: [employer]  Social Needs  . Financial resource strain: Not on file  . Food insecurity:    Worry: Not on file    Inability: Not on file  . Transportation needs:    Medical: Not on file    Non-medical: Not on file  Tobacco Use  . Smoking status: Former Smoker    Packs/day: 0.30    Years: 3.00    Pack years: 0.90    Types: Cigarettes    Last attempt to quit: 10/09/1968    Years since quitting: 49.5  . Smokeless tobacco: Never Used  Substance and Sexual Activity  . Alcohol use: No  . Drug use: No  . Sexual activity: Not on file  Lifestyle  . Physical activity:    Days per week: Not on file    Minutes per session: Not on file  . Stress: Not on file  Relationships  . Social connections:    Talks on phone: Not on file    Gets together: Not on file    Attends religious service: Not on file    Active member of club or organization: Not on file   Attends meetings of clubs or organizations: Not on file    Relationship status: Not on file  . Intimate partner violence:    Fear of current or ex partner: Not on file    Emotionally abused: Not on file    Physically abused: Not on file    Forced sexual activity: Not on file  Other Topics Concern  . Not on file  Social History Narrative  . Not on file    Allergies  Allergen Reactions  . Penicillins     Current Outpatient Medications  Medication Sig Dispense Refill  . allopurinol (ZYLOPRIM) 100 MG tablet Take 100 mg by mouth daily.     Marland Kitchen amLODipine (NORVASC) 10 MG tablet Take 10 mg by mouth daily.      Marland Kitchen aspirin 81 MG tablet Take 81 mg by mouth daily.      . calcitRIOL (ROCALTROL) 0.25 MCG capsule Take 0.25 mcg by mouth daily.     . carvedilol (COREG) 12.5 MG tablet Take 12.5 mg by mouth 2 (two) times daily with a meal.      . cetirizine (ZYRTEC) 10 MG tablet Take 10 mg by mouth daily.    Marland Kitchen doxazosin (  CARDURA) 8 MG tablet Take 8 mg by mouth at bedtime.      . furosemide (LASIX) 40 MG tablet Take 1 1/2 tabs daily    . insulin lispro protamine-insulin lispro (HUMALOG 75/25) (75-25) 100 UNIT/ML SUSP 20 units every morning and 16 units at bedtime     . levothyroxine (SYNTHROID, LEVOTHROID) 75 MCG tablet Take 75 mcg by mouth daily.      Marland Kitchen lisinopril (PRINIVIL,ZESTRIL) 40 MG tablet Take 40 mg by mouth daily.      . methocarbamol (ROBAXIN) 500 MG tablet Take 500 mg by mouth daily.     . montelukast (SINGULAIR) 10 MG tablet Take 10 mg by mouth at bedtime.     . simvastatin (ZOCOR) 40 MG tablet Take 40 mg by mouth at bedtime.      . sodium chloride (OCEAN) 0.65 % SOLN nasal spray Place 1 spray into both nostrils as needed for congestion.    Marland Kitchen testosterone enanthate (DELATESTRYL) 200 MG/ML injection Inject into the muscle every 21 ( twenty-one) days. For IM use only      No current facility-administered medications for this visit.     REVIEW OF SYSTEMS:  [X]  denotes positive finding, [  ] denotes negative finding Cardiac  Comments:  Chest pain or chest pressure:    Shortness of breath upon exertion:    Short of breath when lying flat:    Irregular heart rhythm:        Vascular    Pain in calf, thigh, or hip brought on by ambulation:    Pain in feet at night that wakes you up from your sleep:     Blood clot in your veins:    Leg swelling:  x       Pulmonary    Oxygen at home:    Productive cough:     Wheezing:         Neurologic    Sudden weakness in arms or legs:     Sudden numbness in arms or legs:     Sudden onset of difficulty speaking or slurred speech:    Temporary loss of vision in one eye:     Problems with dizziness:         Gastrointestinal    Blood in stool:     Vomited blood:         Genitourinary    Burning when urinating:     Blood in urine:        Psychiatric    Major depression:         Hematologic    Bleeding problems:    Problems with blood clotting too easily:        Skin    Rashes or ulcers:        Constitutional    Fever or chills:      PHYSICAL EXAM: Vitals:   04/30/18 1355 04/30/18 1358  BP: (!) 155/77 (!) 159/79  Pulse: 69   Resp: 20   SpO2: 98%   Weight: 266 lb 3.2 oz (120.7 kg)   Height: 5\' 11"  (1.803 m)     GENERAL: The patient is a well-nourished male, in no acute distress. The vital signs are documented above. CARDIOVASCULAR: 2+ radial pulses bilaterally.  Small surface veins bilaterally.  Carotid arteries without bruits bilaterally PULMONARY: There is good air exchange  ABDOMEN: Soft and non-tender  MUSCULOSKELETAL: There are no major deformities or cyanosis. NEUROLOGIC: No focal weakness or paresthesias are detected. SKIN: There are no ulcers  or rashes noted. PSYCHIATRIC: The patient has a normal affect.  DATA:  Noninvasive studies revealed normal triphasic wearing arms and his radial and ulnar arteries bilaterally.  Upper extremity vein map show small cephalic veins bilaterally.  Moderate left upper  basilic vein size  MEDICAL ISSUES: I had a very long discussion with the patient and his wife present.  Discussed options to include catheter for acute hemodialysis, AV fistula and AV graft placement.  Described the advantages and benefits and disadvantages of each of these.  He does have what appears to be adequate basilic vein on the left for attempted fistula.  I have recommended basilic vein to brachial artery anastomosis as a first stage and then transposition if he has dilatation of his vein.  Scribed the procedure as an outpatient at [facility].  Patient reports that they he refuses blood products from a religious standpoint.  Explained that this would certainly be honored that it would be exceedingly rare to need transfusion with fistula access.  We will schedule this at his earliest convenience as an outpatient   [name], MD [facility] Vascular and Vein Specialists of [facility] Tel [phone] Pager [phone]

## 2018-05-01 DIAGNOSIS — G4733 Obstructive sleep apnea (adult) (pediatric): Secondary | ICD-10-CM | POA: Diagnosis not present

## 2018-05-03 ENCOUNTER — Encounter (HOSPITAL_COMMUNITY): Payer: Self-pay | Admitting: *Deleted

## 2018-05-03 ENCOUNTER — Encounter (HOSPITAL_COMMUNITY)
Admission: RE | Admit: 2018-05-03 | Discharge: 2018-05-03 | Disposition: A | Discharge status: Home / Self Care | Payer: Medicare HMO | Source: Ambulatory Visit | Attending: Nephrology | Admitting: Nephrology

## 2018-05-03 ENCOUNTER — Other Ambulatory Visit: Payer: Self-pay

## 2018-05-03 DIAGNOSIS — N184 Chronic kidney disease, stage 4 (severe): Secondary | ICD-10-CM | POA: Diagnosis not present

## 2018-05-03 DIAGNOSIS — D631 Anemia in chronic kidney disease: Secondary | ICD-10-CM | POA: Diagnosis not present

## 2018-05-03 LAB — POCT HEMOGLOBIN-HEMACUE: Hemoglobin: 11.7 g/dL — ABNORMAL LOW (ref 13.0–17.0)

## 2018-05-03 MED ORDER — EPOETIN ALFA-EPBX 40000 UNIT/ML IJ SOLN: 30000.0000 [IU] | Freq: Once | INTRAMUSCULAR | Status: DC

## 2018-05-03 MED ORDER — VANCOMYCIN HCL 10 G IV SOLR
1500.0000 mg | INTRAVENOUS | Status: AC
  Administered 2018-05-06: 1500 mg via INTRAVENOUS
  Filled 2018-05-03: qty 1500

## 2018-05-03 MED ORDER — EPOETIN ALFA-EPBX 40000 UNIT/ML IJ SOLN: 30000.0000 [IU] | INTRAMUSCULAR | Status: DC

## 2018-05-03 MED ORDER — EPOETIN ALFA-EPBX 10000 UNIT/ML IJ SOLN
20000.0000 [IU] | Freq: Once | INTRAMUSCULAR | Status: AC
  Administered 2018-05-03: 20000 [IU] via SUBCUTANEOUS
  Filled 2018-05-03: qty 2

## 2018-05-03 MED ORDER — EPOETIN ALFA-EPBX 10000 UNIT/ML IJ SOLN
10000.0000 [IU] | Freq: Once | INTRAMUSCULAR | Status: AC
  Administered 2018-05-03: 10000 [IU] via SUBCUTANEOUS
  Filled 2018-05-03: qty 1

## 2018-05-03 NOTE — Progress Notes (Signed)
David Nguyen denies chest pain or shortness of breath.  PCP is Dr Elsworth Soho, Nephrologist is Dr Jimmy Footman. David Nguyen is a Type II diabetic, he reports that he checks CBG 2 times a day, "it runs good , today it was 130".  Patient reports that last A1C  was 6 "something". I instructed patient to take 11 units of 70/30 at dinner Sunday and no 70/30 Monday am. I instructed patient to check CBG after awaking and every 2 hours until arrival  to the hospital.  I Instructed patient if CBG is less than 70 to  1/2 cup of a clear juice, like apple, grape, cranberry. Recheck CBG in 15 minutes then call pre- op desk at [phone] for further instructions.

## 2018-05-05 NOTE — Anesthesia Preprocedure Evaluation (Addendum)
Anesthesia Evaluation  Patient identified by MRN, date of birth, ID band Patient awake    Reviewed: Allergy & Precautions, H&P , NPO status , Patient's Chart, lab work & pertinent test results, reviewed documented beta blocker date and time   Airway Mallampati: II  TM Distance: >3 FB Neck ROM: full    Dental no notable dental hx. (+) Edentulous Upper, Upper Dentures, Poor Dentition, Chipped, Missing   Pulmonary sleep apnea , former smoker,    Pulmonary exam normal breath sounds clear to auscultation       Cardiovascular Exercise Tolerance: Good hypertension, Pt. on medications negative cardio ROS   Rhythm:regular Rate:Normal     Neuro/Psych negative neurological ROS  negative psych ROS   GI/Hepatic   Endo/Other  diabetes, Type 2Hypothyroidism Morbid obesity  Renal/GU CRFRenal disease     Musculoskeletal   Abdominal   Peds  Hematology  (+) anemia ,   Anesthesia Other Findings   Reproductive/Obstetrics negative OB ROS                            Anesthesia Physical Anesthesia Plan  ASA: III  Anesthesia Plan: General   Post-op Pain Management:    Induction: Intravenous  PONV Risk Score and Plan: 2 and Treatment may vary due to age or medical condition and Ondansetron  Airway Management Planned: Oral ETT and LMA  Additional Equipment:   Intra-op Plan:   Post-operative Plan: Extubation in OR  Informed Consent: I have reviewed the patients History and Physical, chart, labs and discussed the procedure including the risks, benefits and alternatives for the proposed anesthesia with the patient or authorized representative who has indicated his/her understanding and acceptance.   Dental Advisory Given  Plan Discussed with: CRNA, Anesthesiologist and Surgeon  Anesthesia Plan Comments: ( )       Anesthesia Quick Evaluation

## 2018-05-06 ENCOUNTER — Ambulatory Visit (HOSPITAL_COMMUNITY): Payer: Medicare HMO | Admitting: Anesthesiology

## 2018-05-06 ENCOUNTER — Ambulatory Visit (HOSPITAL_COMMUNITY)
Admission: RE | Admit: 2018-05-06 | Discharge: 2018-05-06 | Disposition: A | Discharge status: Home / Self Care | Payer: Medicare HMO | Source: Ambulatory Visit | Attending: Vascular Surgery | Admitting: Vascular Surgery

## 2018-05-06 ENCOUNTER — Encounter (HOSPITAL_COMMUNITY): Payer: Self-pay | Admitting: Surgery

## 2018-05-06 ENCOUNTER — Encounter (HOSPITAL_COMMUNITY)
Admission: RE | Disposition: A | Discharge status: Home / Self Care | Payer: Self-pay | Source: Ambulatory Visit | Attending: Vascular Surgery

## 2018-05-06 ENCOUNTER — Telehealth: Payer: Self-pay | Admitting: Vascular Surgery

## 2018-05-06 ENCOUNTER — Other Ambulatory Visit: Payer: Self-pay

## 2018-05-06 DIAGNOSIS — Z79890 Hormone replacement therapy (postmenopausal): Secondary | ICD-10-CM | POA: Diagnosis not present

## 2018-05-06 DIAGNOSIS — E039 Hypothyroidism, unspecified: Secondary | ICD-10-CM | POA: Diagnosis not present

## 2018-05-06 DIAGNOSIS — Z841 Family history of disorders of kidney and ureter: Secondary | ICD-10-CM | POA: Insufficient documentation

## 2018-05-06 DIAGNOSIS — Z794 Long term (current) use of insulin: Secondary | ICD-10-CM | POA: Diagnosis not present

## 2018-05-06 DIAGNOSIS — E1122 Type 2 diabetes mellitus with diabetic chronic kidney disease: Secondary | ICD-10-CM | POA: Diagnosis not present

## 2018-05-06 DIAGNOSIS — N189 Chronic kidney disease, unspecified: Secondary | ICD-10-CM | POA: Insufficient documentation

## 2018-05-06 DIAGNOSIS — Z6837 Body mass index (BMI) 37.0-37.9, adult: Secondary | ICD-10-CM | POA: Diagnosis not present

## 2018-05-06 DIAGNOSIS — I129 Hypertensive chronic kidney disease with stage 1 through stage 4 chronic kidney disease, or unspecified chronic kidney disease: Secondary | ICD-10-CM | POA: Diagnosis not present

## 2018-05-06 DIAGNOSIS — E785 Hyperlipidemia, unspecified: Secondary | ICD-10-CM | POA: Insufficient documentation

## 2018-05-06 DIAGNOSIS — Z87891 Personal history of nicotine dependence: Secondary | ICD-10-CM | POA: Diagnosis not present

## 2018-05-06 DIAGNOSIS — G4730 Sleep apnea, unspecified: Secondary | ICD-10-CM | POA: Diagnosis not present

## 2018-05-06 DIAGNOSIS — Z79899 Other long term (current) drug therapy: Secondary | ICD-10-CM | POA: Diagnosis not present

## 2018-05-06 DIAGNOSIS — Z7982 Long term (current) use of aspirin: Secondary | ICD-10-CM | POA: Insufficient documentation

## 2018-05-06 DIAGNOSIS — N184 Chronic kidney disease, stage 4 (severe): Secondary | ICD-10-CM | POA: Diagnosis not present

## 2018-05-06 DIAGNOSIS — N185 Chronic kidney disease, stage 5: Secondary | ICD-10-CM | POA: Diagnosis not present

## 2018-05-06 HISTORY — PX: AV FISTULA PLACEMENT: SHX1204

## 2018-05-06 HISTORY — DX: Hypothyroidism, unspecified: E03.9

## 2018-05-06 LAB — POCT I-STAT 4, (NA,K, GLUC, HGB,HCT)
GLUCOSE: 113 mg/dL — AB (ref 70–99)
HCT: 37 % — ABNORMAL LOW (ref 39.0–52.0)
Hemoglobin: 12.6 g/dL — ABNORMAL LOW (ref 13.0–17.0)
Potassium: 4.1 mmol/L (ref 3.5–5.1)
Sodium: 140 mmol/L (ref 135–145)

## 2018-05-06 LAB — GLUCOSE, CAPILLARY
GLUCOSE-CAPILLARY: 106 mg/dL — AB (ref 70–99)
Glucose-Capillary: 93 mg/dL (ref 70–99)

## 2018-05-06 SURGERY — ARTERIOVENOUS (AV) FISTULA CREATION
Anesthesia: General | Site: Arm Lower | Laterality: Left

## 2018-05-06 MED ORDER — DEXAMETHASONE SODIUM PHOSPHATE 10 MG/ML IJ SOLN
INTRAMUSCULAR | Status: DC | PRN
  Administered 2018-05-06: 5 mg via INTRAVENOUS

## 2018-05-06 MED ORDER — FENTANYL CITRATE (PF) 250 MCG/5ML IJ SOLN
INTRAMUSCULAR | Status: AC
  Filled 2018-05-06: qty 5

## 2018-05-06 MED ORDER — MEPERIDINE HCL 50 MG/ML IJ SOLN: 6.2500 mg | INTRAMUSCULAR | Status: DC | PRN

## 2018-05-06 MED ORDER — SODIUM CHLORIDE 0.9 % IV SOLN
INTRAVENOUS | Status: AC
  Filled 2018-05-06: qty 1.2

## 2018-05-06 MED ORDER — OXYCODONE-ACETAMINOPHEN 5-325 MG PO TABS: 1.0000 | ORAL_TABLET | Freq: Four times a day (QID) | ORAL | 0 refills | Status: DC | PRN

## 2018-05-06 MED ORDER — FENTANYL CITRATE (PF) 100 MCG/2ML IJ SOLN: 25.0000 ug | INTRAMUSCULAR | Status: DC | PRN

## 2018-05-06 MED ORDER — PHENYLEPHRINE 40 MCG/ML (10ML) SYRINGE FOR IV PUSH (FOR BLOOD PRESSURE SUPPORT)
PREFILLED_SYRINGE | INTRAVENOUS | Status: DC | PRN
  Administered 2018-05-06: 80 ug via INTRAVENOUS

## 2018-05-06 MED ORDER — SODIUM CHLORIDE 0.9 % IV SOLN
INTRAVENOUS | Status: DC
  Administered 2018-05-06: 07:00:00 via INTRAVENOUS

## 2018-05-06 MED ORDER — ONDANSETRON HCL 4 MG/2ML IJ SOLN
INTRAMUSCULAR | Status: AC
  Filled 2018-05-06: qty 2

## 2018-05-06 MED ORDER — MIDAZOLAM HCL 2 MG/2ML IJ SOLN
INTRAMUSCULAR | Status: AC
  Filled 2018-05-06: qty 2

## 2018-05-06 MED ORDER — ONDANSETRON HCL 4 MG/2ML IJ SOLN
INTRAMUSCULAR | Status: DC | PRN
  Administered 2018-05-06: 4 mg via INTRAVENOUS

## 2018-05-06 MED ORDER — CHLORHEXIDINE GLUCONATE 4 % EX LIQD: 60.0000 mL | Freq: Once | CUTANEOUS | Status: DC

## 2018-05-06 MED ORDER — 0.9 % SODIUM CHLORIDE (POUR BTL) OPTIME
TOPICAL | Status: DC | PRN
  Administered 2018-05-06: 1000 mL

## 2018-05-06 MED ORDER — EPHEDRINE SULFATE-NACL 50-0.9 MG/10ML-% IV SOSY
PREFILLED_SYRINGE | INTRAVENOUS | Status: DC | PRN
  Administered 2018-05-06 (×2): 10 mg via INTRAVENOUS

## 2018-05-06 MED ORDER — HEPARIN SODIUM (PORCINE) 5000 UNIT/ML IJ SOLN
INTRAMUSCULAR | Status: DC | PRN
  Administered 2018-05-06: 500 mL

## 2018-05-06 MED ORDER — EPHEDRINE SULFATE 50 MG/ML IJ SOLN
INTRAMUSCULAR | Status: AC
  Filled 2018-05-06: qty 1

## 2018-05-06 MED ORDER — FENTANYL CITRATE (PF) 100 MCG/2ML IJ SOLN
INTRAMUSCULAR | Status: DC | PRN
  Administered 2018-05-06: 50 ug via INTRAVENOUS

## 2018-05-06 SURGICAL SUPPLY — 27 items
ARMBAND PINK RESTRICT EXTREMIT (MISCELLANEOUS) ×4 IMPLANT
CANISTER SUCT 3000ML PPV (MISCELLANEOUS) ×4 IMPLANT
CANNULA VESSEL 3MM 2 BLNT TIP (CANNULA) ×4 IMPLANT
CLIP LIGATING EXTRA MED SLVR (CLIP) ×4 IMPLANT
CLIP LIGATING EXTRA SM BLUE (MISCELLANEOUS) ×4 IMPLANT
COVER PROBE W GEL 5X96 (DRAPES) ×4 IMPLANT
DECANTER SPIKE VIAL GLASS SM (MISCELLANEOUS) IMPLANT
DERMABOND ADVANCED (GAUZE/BANDAGES/DRESSINGS) ×2
DERMABOND ADVANCED .7 DNX12 (GAUZE/BANDAGES/DRESSINGS) ×2 IMPLANT
ELECT REM PT RETURN 9FT ADLT (ELECTROSURGICAL) ×4
ELECTRODE REM PT RTRN 9FT ADLT (ELECTROSURGICAL) ×2 IMPLANT
GLOVE SS BIOGEL STRL SZ 7.5 (GLOVE) ×2 IMPLANT
GLOVE SUPERSENSE BIOGEL SZ 7.5 (GLOVE) ×2
GOWN STRL REUS W/ TWL LRG LVL3 (GOWN DISPOSABLE) ×6 IMPLANT
GOWN STRL REUS W/TWL LRG LVL3 (GOWN DISPOSABLE) ×6
KIT BASIN OR (CUSTOM PROCEDURE TRAY) ×4 IMPLANT
KIT TURNOVER KIT B (KITS) ×4 IMPLANT
NS IRRIG 1000ML POUR BTL (IV SOLUTION) ×4 IMPLANT
PACK CV ACCESS (CUSTOM PROCEDURE TRAY) ×4 IMPLANT
PAD ARMBOARD 7.5X6 YLW CONV (MISCELLANEOUS) ×8 IMPLANT
SUT PROLENE 6 0 CC (SUTURE) ×4 IMPLANT
SUT SILK 2 0 SH (SUTURE) ×4 IMPLANT
SUT VIC AB 3-0 SH 27 (SUTURE) ×2
SUT VIC AB 3-0 SH 27X BRD (SUTURE) ×2 IMPLANT
TOWEL GREEN STERILE (TOWEL DISPOSABLE) ×4 IMPLANT
UNDERPAD 30X30 (UNDERPADS AND DIAPERS) ×4 IMPLANT
WATER STERILE IRR 1000ML POUR (IV SOLUTION) ×4 IMPLANT

## 2018-05-06 NOTE — Interval H&P Note (Signed)
History and Physical Interval Note:  05/06/2018 7:13 AM  David Nguyen  has presented today for surgery, with the diagnosis of CHRONIC KIDNEY DISEASE FOR HEMODIALYSIS ACCESS  The various methods of treatment have been discussed with the patient and family. After consideration of risks, benefits and other options for treatment, the patient has consented to  Procedure(s): FIRST STAGE BASILIC VEIN TRANSPOSITION LEFT ARM (Left) as a surgical intervention .  The patient's history has been reviewed, patient examined, no change in status, stable for surgery.  I have reviewed the patient's chart and labs.  Questions were answered to the patient's satisfaction.     Curt Jews

## 2018-05-06 NOTE — Discharge Instructions (Signed)
° °  Vascular and Vein Specialists of [place] ° °Discharge Instructions ° °AV Fistula or Graft Surgery for Dialysis Access ° °Please refer to the following instructions for your post-procedure care. Your surgeon or physician assistant will discuss any changes with you. ° °Activity ° °You may drive the day following your surgery, if you are comfortable and no longer taking prescription pain medication. Resume full activity as the soreness in your incision resolves. ° °Bathing/Showering ° °You may shower after you go home. Keep your incision dry for 48 hours. Do not soak in a bathtub, hot tub, or swim until the incision heals completely. You may not shower if you have a hemodialysis catheter. ° °Incision Care ° °Clean your incision with mild soap and water after 48 hours. Pat the area dry with a clean towel. You do not need a bandage unless otherwise instructed. Do not apply any ointments or creams to your incision. You may have skin glue on your incision. Do not peel it off. It will come off on its own in about one week. Your arm may swell a bit after surgery. To reduce swelling use pillows to elevate your arm so it is above your heart. Your doctor will tell you if you need to lightly wrap your arm with an ACE bandage. ° °Diet ° °Resume your normal diet. There are not special food restrictions following this procedure. In order to heal from your surgery, it is CRITICAL to get adequate nutrition. Your body requires vitamins, minerals, and protein. Vegetables are the best source of vitamins and minerals. Vegetables also provide the perfect balance of protein. Processed food has little nutritional value, so try to avoid this. ° °Medications ° °Resume taking all of your medications. If your incision is causing pain, you may take over-the counter pain relievers such as acetaminophen (Tylenol). If you were prescribed a stronger pain medication, please be aware these medications can cause nausea and constipation. Prevent  nausea by taking the medication with a snack or meal. Avoid constipation by drinking plenty of fluids and eating foods with high amount of fiber, such as fruits, vegetables, and grains. Do not take Tylenol if you are taking prescription pain medications. ° ° ° ° °Follow up °Your surgeon may want to see you in the office following your access surgery. If so, this will be arranged at the time of your surgery. ° °Please call us immediately for any of the following conditions: ° °Increased pain, redness, drainage (pus) from your incision site °Fever of 101 degrees or higher °Severe or worsening pain at your incision site °Hand pain or numbness. ° °Reduce your risk of vascular disease: ° °Stop smoking. If you would like help, call [facility] at 1-800-QUIT-NOW (1-800-784-8669) or [facility] at 336-586-4000 ° °Manage your cholesterol °Maintain a desired weight °Control your diabetes °Keep your blood pressure down ° °Dialysis ° °It will take several weeks to several months for your new dialysis access to be ready for use. Your surgeon will determine when it is OK to use it. Your nephrologist will continue to direct your dialysis. You can continue to use your Permcath until your new access is ready for use. ° °If you have any questions, please call the office at [phone]. ° °

## 2018-05-06 NOTE — Anesthesia Postprocedure Evaluation (Signed)
Anesthesia Post Note  Patient: David Nguyen  Procedure(s) Performed: ARTERIOVENOUS (AV) FISTULA CREATION RADIOCEPHALIC (Left Arm Lower)     Anesthesia Type: General    Last Vitals:  Vitals:   05/06/18 0930 05/06/18 0934  BP:  (!) 158/62  Pulse: 68 69  Resp: 12 16  Temp:    SpO2: 100% 98%    Last Pain:  Vitals:   05/06/18 0903  TempSrc:   PainSc: Asleep                 Arthella Headings

## 2018-05-06 NOTE — Anesthesia Postprocedure Evaluation (Signed)
Anesthesia Post Note  Patient: David Nguyen  Procedure(s) Performed: ARTERIOVENOUS (AV) FISTULA CREATION RADIOCEPHALIC (Left Arm Lower)     Patient location during evaluation: PACU Anesthesia Type: General Level of consciousness: awake and alert Pain management: pain level controlled Vital Signs Assessment: post-procedure vital signs reviewed and stable Respiratory status: spontaneous breathing, nonlabored ventilation, respiratory function stable and patient connected to nasal cannula oxygen Cardiovascular status: blood pressure returned to baseline and stable Postop Assessment: no apparent nausea or vomiting Anesthetic complications: no    Last Vitals:  Vitals:   05/06/18 0930 05/06/18 0934  BP:  (!) 158/62  Pulse: 68 69  Resp: 12 16  Temp:    SpO2: 100% 98%    Last Pain:  Vitals:   05/06/18 0903  TempSrc:   PainSc: Asleep                 Mase Dhondt

## 2018-05-06 NOTE — Anesthesia Procedure Notes (Signed)
Procedure Name: LMA Insertion Date/Time: 05/06/2018 7:41 AM Performed by: Gwyndolyn Saxon, CRNA Pre-anesthesia Checklist: Patient identified, Emergency Drugs available, Suction available, Patient being monitored and Timeout performed Patient Re-evaluated:Patient Re-evaluated prior to induction Oxygen Delivery Method: Circle system utilized Preoxygenation: Pre-oxygenation with 100% oxygen Induction Type: IV induction Ventilation: Mask ventilation without difficulty and Oral airway inserted - appropriate to patient size LMA: LMA inserted LMA Size: 5.0 Number of attempts: 1 Placement Confirmation: positive ETCO2,  CO2 detector and breath sounds checked- equal and bilateral Tube secured with: Tape Dental Injury: Teeth and Oropharynx as per pre-operative assessment

## 2018-05-06 NOTE — Transfer of Care (Signed)
Immediate Anesthesia Transfer of Care Note  Patient: David Nguyen  Procedure(s) Performed: ARTERIOVENOUS (AV) FISTULA CREATION RADIOCEPHALIC (Left Arm Lower)  Patient Location: PACU  Anesthesia Type:General  Level of Consciousness: drowsy  Airway & Oxygen Therapy: Patient Spontanous Breathing and Patient connected to face mask oxygen  Post-op Assessment: Report given to RN and Post -op Vital signs reviewed and stable  Post vital signs: Reviewed and stable  Last Vitals:  Vitals Value Taken Time  BP    Temp    Pulse    Resp    SpO2      Last Pain:  Vitals:   05/06/18 0644  TempSrc: Oral  PainSc:       Patients Stated Pain Goal: 3 (00/17/49 4496)  Complications: No apparent anesthesia complications

## 2018-05-06 NOTE — Op Note (Signed)
    OPERATIVE REPORT  DATE OF SURGERY: 05/06/2018  PATIENT: David Nguyen, 77 y.o. male MRN: [id number]  DOB: [birth year]  PRE-OPERATIVE DIAGNOSIS: Chronic renal insufficiency  POST-OPERATIVE DIAGNOSIS:  Same  PROCEDURE: Left radiocephalic fistula  SURGEON:  Curt Jews, M.D.  PHYSICIAN ASSISTANT: Laurence Slate, PA-C  ANESTHESIA: LMA  EBL: per anesthesia record  Total I/O In: -  Out: 10 [Blood:10]  BLOOD ADMINISTERED: none  DRAINS: none  SPECIMEN: none  COUNTS CORRECT:  YES  PATIENT DISPOSITION:  PACU - hemodynamically stable  PROCEDURE DETAILS: Patient was taken to the operative place evaluation of the area of the left arm was prepped and draped in usual sterile fashion.  SonoSite ultrasound was used to visualize the veins in the left arm and the patient did have a good quality cephalic vein from the wrist to the antecubital space and above.  This was different than the preoperative vein mapping in the office.  Incision was made between the level of the radial artery and the cephalic vein at the wrist and the vein was a very good caliber.  Tributary branches were ligated with 3-0 and 4-0 silk ties and divided.  The vein was brought into approximation with the radial artery.  The artery was occluded proximally and distally with Serafin clamps and was opened with an 11 blade and sent longitudinally with Potts scissors.  The vein was cut to the appropriate length and was spatulated and sewn end-to-side to the artery with a running 6-0 Prolene suture.  Clamps removed and good thrill was noted with a good size vein.  Wounds were irrigated with saline.  Hemostasis talus cautery.  Wound was closed with 3-0 Vicryl in the subcutaneous and subcuticular tissue.  Sterile dressing was applied and the patient was transferred to the recovery room in stable condition   [name], M.D., [facility] 05/06/2018 9:02 AM

## 2018-05-06 NOTE — Telephone Encounter (Signed)
sch appt spk to pt wife  06/11/18 3pm p/o PA

## 2018-05-07 ENCOUNTER — Encounter (HOSPITAL_COMMUNITY): Payer: Self-pay | Admitting: Vascular Surgery

## 2018-05-07 DIAGNOSIS — E291 Testicular hypofunction: Secondary | ICD-10-CM | POA: Diagnosis not present

## 2018-05-22 ENCOUNTER — Other Ambulatory Visit (HOSPITAL_COMMUNITY): Payer: Self-pay | Admitting: *Deleted

## 2018-05-23 ENCOUNTER — Ambulatory Visit (HOSPITAL_COMMUNITY)
Admission: RE | Admit: 2018-05-23 | Discharge: 2018-05-23 | Disposition: A | Discharge status: Home / Self Care | Payer: Medicare HMO | Source: Ambulatory Visit | Attending: Nephrology | Admitting: Nephrology

## 2018-05-23 VITALS — BP 163/68 | HR 62 | Temp 97.5°F | Resp 16

## 2018-05-23 DIAGNOSIS — Z862 Personal history of diseases of the blood and blood-forming organs and certain disorders involving the immune mechanism: Secondary | ICD-10-CM

## 2018-05-23 DIAGNOSIS — N184 Chronic kidney disease, stage 4 (severe): Secondary | ICD-10-CM | POA: Diagnosis not present

## 2018-05-23 DIAGNOSIS — N189 Chronic kidney disease, unspecified: Secondary | ICD-10-CM

## 2018-05-23 DIAGNOSIS — D631 Anemia in chronic kidney disease: Secondary | ICD-10-CM | POA: Insufficient documentation

## 2018-05-23 LAB — IRON AND TIBC
Iron: 60 ug/dL (ref 45–182)
SATURATION RATIOS: 21 % (ref 17.9–39.5)
TIBC: 293 ug/dL (ref 250–450)
UIBC: 233 ug/dL

## 2018-05-23 LAB — FERRITIN: Ferritin: 140 ng/mL (ref 24–336)

## 2018-05-23 LAB — POCT HEMOGLOBIN-HEMACUE: Hemoglobin: 12.1 g/dL — ABNORMAL LOW (ref 13.0–17.0)

## 2018-05-23 MED ORDER — EPOETIN ALFA-EPBX 40000 UNIT/ML IJ SOLN
30000.0000 [IU] | Freq: Once | INTRAMUSCULAR | Status: DC
Start: 2018-05-23 — End: 2018-05-23

## 2018-05-23 MED ORDER — EPOETIN ALFA-EPBX 10000 UNIT/ML IJ SOLN
30000.0000 [IU] | Freq: Once | INTRAMUSCULAR | Status: DC
  Filled 2018-05-23: qty 3

## 2018-05-24 ENCOUNTER — Encounter (HOSPITAL_COMMUNITY): Payer: Medicare HMO

## 2018-05-28 DIAGNOSIS — E298 Other testicular dysfunction: Secondary | ICD-10-CM | POA: Diagnosis not present

## 2018-06-01 DIAGNOSIS — G4733 Obstructive sleep apnea (adult) (pediatric): Secondary | ICD-10-CM | POA: Diagnosis not present

## 2018-06-05 ENCOUNTER — Other Ambulatory Visit (HOSPITAL_COMMUNITY): Payer: Self-pay

## 2018-06-05 DIAGNOSIS — Z125 Encounter for screening for malignant neoplasm of prostate: Secondary | ICD-10-CM | POA: Diagnosis not present

## 2018-06-05 DIAGNOSIS — E1129 Type 2 diabetes mellitus with other diabetic kidney complication: Secondary | ICD-10-CM | POA: Diagnosis not present

## 2018-06-05 DIAGNOSIS — N184 Chronic kidney disease, stage 4 (severe): Secondary | ICD-10-CM | POA: Diagnosis not present

## 2018-06-05 DIAGNOSIS — M109 Gout, unspecified: Secondary | ICD-10-CM | POA: Diagnosis not present

## 2018-06-05 DIAGNOSIS — E7849 Other hyperlipidemia: Secondary | ICD-10-CM | POA: Diagnosis not present

## 2018-06-05 DIAGNOSIS — R82998 Other abnormal findings in urine: Secondary | ICD-10-CM | POA: Diagnosis not present

## 2018-06-05 DIAGNOSIS — E291 Testicular hypofunction: Secondary | ICD-10-CM | POA: Diagnosis not present

## 2018-06-05 DIAGNOSIS — E038 Other specified hypothyroidism: Secondary | ICD-10-CM | POA: Diagnosis not present

## 2018-06-06 ENCOUNTER — Ambulatory Visit (HOSPITAL_COMMUNITY)
Admission: RE | Admit: 2018-06-06 | Discharge: 2018-06-06 | Disposition: A | Discharge status: Home / Self Care | Payer: Medicare HMO | Source: Ambulatory Visit | Attending: Nephrology | Admitting: Nephrology

## 2018-06-06 VITALS — BP 138/65 | HR 50 | Temp 97.7°F | Ht 72.0 in | Wt 270.0 lb

## 2018-06-06 DIAGNOSIS — D631 Anemia in chronic kidney disease: Secondary | ICD-10-CM | POA: Insufficient documentation

## 2018-06-06 DIAGNOSIS — N189 Chronic kidney disease, unspecified: Secondary | ICD-10-CM | POA: Diagnosis not present

## 2018-06-06 DIAGNOSIS — Z862 Personal history of diseases of the blood and blood-forming organs and certain disorders involving the immune mechanism: Secondary | ICD-10-CM

## 2018-06-06 LAB — POCT HEMOGLOBIN-HEMACUE: Hemoglobin: 12.2 g/dL — ABNORMAL LOW (ref 13.0–17.0)

## 2018-06-06 MED ORDER — EPOETIN ALFA-EPBX 10000 UNIT/ML IJ SOLN
30000.0000 [IU] | Freq: Once | INTRAMUSCULAR | Status: DC
  Filled 2018-06-06: qty 3

## 2018-06-06 MED ORDER — SODIUM CHLORIDE 0.9 % IV SOLN
510.0000 mg | Freq: Once | INTRAVENOUS | Status: AC
  Administered 2018-06-06: 510 mg via INTRAVENOUS
  Filled 2018-06-06: qty 17

## 2018-06-11 ENCOUNTER — Other Ambulatory Visit: Payer: Self-pay

## 2018-06-11 ENCOUNTER — Ambulatory Visit (INDEPENDENT_AMBULATORY_CARE_PROVIDER_SITE_OTHER): Payer: Medicare HMO | Admitting: Physician Assistant

## 2018-06-11 VITALS — BP 166/66 | HR 61 | Temp 97.3°F | Resp 20 | Ht 72.0 in | Wt 270.0 lb

## 2018-06-11 DIAGNOSIS — N184 Chronic kidney disease, stage 4 (severe): Secondary | ICD-10-CM

## 2018-06-11 DIAGNOSIS — E1122 Type 2 diabetes mellitus with diabetic chronic kidney disease: Secondary | ICD-10-CM

## 2018-06-11 NOTE — H&P (View-Only) (Signed)
POST OPERATIVE OFFICE NOTE    CC:  F/u for surgery  HPI:  This is a 77 y.o. male who is s/p Left radial cephalic fistula creation by Dr. Donnetta Hutching on 05/06/2018.  He denise pain, loss of motor and loss of sensation in theleft UE.  He denise fever or chills.    There have been no changes in his medical history since his last visit.   Allergies  Allergen Reactions  . Penicillins Other (See Comments)    UNSPECIFIED REACTION  Has patient had a PCN reaction causing immediate rash, facial/tongue/throat swelling, SOB or lightheadedness with hypotension: Unknown Has patient had a PCN reaction causing severe rash involving mucus membranes or skin necrosis: Unknown Has patient had a PCN reaction that required hospitalization: Unknown Has patient had a PCN reaction occurring within the last 10 years: No If all of the above answers are "NO", then may proceed with Cephalosporin use.     Current Outpatient Medications  Medication Sig Dispense Refill  . allopurinol (ZYLOPRIM) 100 MG tablet Take 200 mg by mouth daily.     Marland Kitchen amLODipine (NORVASC) 10 MG tablet Take 10 mg by mouth at bedtime.     Marland Kitchen aspirin 81 MG tablet Take 81 mg by mouth daily.      . calcitRIOL (ROCALTROL) 0.25 MCG capsule Take 0.25 mcg by mouth daily.     . carvedilol (COREG) 25 MG tablet Take 12.5 mg by mouth 2 (two) times daily with a meal.     . doxazosin (CARDURA) 8 MG tablet Take 8 mg by mouth at bedtime.      . fluticasone (FLONASE) 50 MCG/ACT nasal spray Place 2 sprays into both nostrils daily as needed for allergies.  11  . furosemide (LASIX) 80 MG tablet Take 80 mg by mouth 2 (two) times daily.     . Homeopathic Products [facility] ALLERGY EYE RELIEF) SOLN Place 1-2 drops into both eyes daily as needed (for dry eyes).    . insulin NPH-regular Human (NOVOLIN 70/30) (70-30) 100 UNIT/ML injection Inject 16-20 Units into the skin See admin instructions. Inject 20 units SQ in the morning and inject 16 units SQ at bedtime    .  levothyroxine (SYNTHROID, LEVOTHROID) 75 MCG tablet Take 75 mcg by mouth daily before breakfast.     . lisinopril (PRINIVIL,ZESTRIL) 40 MG tablet Take 40 mg by mouth at bedtime.     . montelukast (SINGULAIR) 10 MG tablet Take 10 mg by mouth daily.     Marland Kitchen oxyCODONE-acetaminophen (PERCOCET/ROXICET) 5-325 MG tablet Take 1 tablet by mouth every 6 (six) hours as needed. 6 tablet 0  . simvastatin (ZOCOR) 40 MG tablet Take 40 mg by mouth at bedtime.      Marland Kitchen testosterone cypionate (DEPOTESTOSTERONE CYPIONATE) 200 MG/ML injection Inject 200 mg into the muscle every 21 ( twenty-one) days.     No current facility-administered medications for this visit.      ROS:  See HPI  Physical Exam:  Vitals:   06/11/18 1442  BP: (!) 166/66  Pulse: 61  Resp: 20  Temp: (!) 97.3 F (36.3 C)  SpO2: 99%    Incision:  Well healed left wrist incision. Extremities:  Palpable thrill in the distal 1/3 of the fistula. Heart RRR Abdomen:  + BS soft, NTTP  Assessment/Plan:  This is a 77 y.o. male who is s/p: Left radial Cephalic fistula creation  I have scheduled him for a fistula duplex in 2 weeks to check for maturity of his fistula.  I asked him to feel the fistula for a thrill daily.  This was demonstrated to the patient today in the office.  If he problems or concerns he will call.  The fistula is 72 weeks old at this time and has good maturity potential.  He is not on HD currently.  I will review the duplex in 2 weeks.  I discussed with him that there is a possibility he will need a second procedure to superficialize the fistula pending the duplex.  If this is the case we will call him and schedule a second procedure.     Roxy Horseman , PA-C [facility] [phone]

## 2018-06-11 NOTE — Progress Notes (Addendum)
POST OPERATIVE OFFICE NOTE    CC:  F/u for surgery  HPI:  This is a 77 y.o. male who is s/p Left radial cephalic fistula creation by Dr. Donnetta Hutching on 05/06/2018.  He denise pain, loss of motor and loss of sensation in theleft UE.  He denise fever or chills.    There have been no changes in his medical history since his last visit.   Allergies  Allergen Reactions  . Penicillins Other (See Comments)    UNSPECIFIED REACTION  Has patient had a PCN reaction causing immediate rash, facial/tongue/throat swelling, SOB or lightheadedness with hypotension: Unknown Has patient had a PCN reaction causing severe rash involving mucus membranes or skin necrosis: Unknown Has patient had a PCN reaction that required hospitalization: Unknown Has patient had a PCN reaction occurring within the last 10 years: No If all of the above answers are "NO", then may proceed with Cephalosporin use.     Current Outpatient Medications  Medication Sig Dispense Refill  . allopurinol (ZYLOPRIM) 100 MG tablet Take 200 mg by mouth daily.     Marland Kitchen amLODipine (NORVASC) 10 MG tablet Take 10 mg by mouth at bedtime.     Marland Kitchen aspirin 81 MG tablet Take 81 mg by mouth daily.      . calcitRIOL (ROCALTROL) 0.25 MCG capsule Take 0.25 mcg by mouth daily.     . carvedilol (COREG) 25 MG tablet Take 12.5 mg by mouth 2 (two) times daily with a meal.     . doxazosin (CARDURA) 8 MG tablet Take 8 mg by mouth at bedtime.      . fluticasone (FLONASE) 50 MCG/ACT nasal spray Place 2 sprays into both nostrils daily as needed for allergies.  11  . furosemide (LASIX) 80 MG tablet Take 80 mg by mouth 2 (two) times daily.     . Homeopathic Products [facility] ALLERGY EYE RELIEF) SOLN Place 1-2 drops into both eyes daily as needed (for dry eyes).    . insulin NPH-regular Human (NOVOLIN 70/30) (70-30) 100 UNIT/ML injection Inject 16-20 Units into the skin See admin instructions. Inject 20 units SQ in the morning and inject 16 units SQ at bedtime    .  levothyroxine (SYNTHROID, LEVOTHROID) 75 MCG tablet Take 75 mcg by mouth daily before breakfast.     . lisinopril (PRINIVIL,ZESTRIL) 40 MG tablet Take 40 mg by mouth at bedtime.     . montelukast (SINGULAIR) 10 MG tablet Take 10 mg by mouth daily.     Marland Kitchen oxyCODONE-acetaminophen (PERCOCET/ROXICET) 5-325 MG tablet Take 1 tablet by mouth every 6 (six) hours as needed. 6 tablet 0  . simvastatin (ZOCOR) 40 MG tablet Take 40 mg by mouth at bedtime.      Marland Kitchen testosterone cypionate (DEPOTESTOSTERONE CYPIONATE) 200 MG/ML injection Inject 200 mg into the muscle every 21 ( twenty-one) days.     No current facility-administered medications for this visit.      ROS:  See HPI  Physical Exam:  Vitals:   06/11/18 1442  BP: (!) 166/66  Pulse: 61  Resp: 20  Temp: (!) 97.3 F (36.3 C)  SpO2: 99%    Incision:  Well healed left wrist incision. Extremities:  Palpable thrill in the distal 1/3 of the fistula. Heart RRR Abdomen:  + BS soft, NTTP  Assessment/Plan:  This is a 77 y.o. male who is s/p: Left radial Cephalic fistula creation  I have scheduled him for a fistula duplex in 2 weeks to check for maturity of his fistula.  I asked him to feel the fistula for a thrill daily.  This was demonstrated to the patient today in the office.  If he problems or concerns he will call.  The fistula is 6 weeks old at this time and has good maturity potential.  He is not on HD currently.  I will review the duplex in 2 weeks.  I discussed with him that there is a possibility he will need a second procedure to superficialize the fistula pending the duplex.  If this is the case we will call him and schedule a second procedure.     Roxy Horseman , PA-C [facility] [phone]

## 2018-06-12 ENCOUNTER — Other Ambulatory Visit: Payer: Self-pay

## 2018-06-12 DIAGNOSIS — N185 Chronic kidney disease, stage 5: Secondary | ICD-10-CM

## 2018-06-12 DIAGNOSIS — D631 Anemia in chronic kidney disease: Secondary | ICD-10-CM | POA: Diagnosis not present

## 2018-06-12 DIAGNOSIS — N184 Chronic kidney disease, stage 4 (severe): Secondary | ICD-10-CM | POA: Diagnosis not present

## 2018-06-12 DIAGNOSIS — E1129 Type 2 diabetes mellitus with other diabetic kidney complication: Secondary | ICD-10-CM | POA: Diagnosis not present

## 2018-06-12 DIAGNOSIS — I10 Essential (primary) hypertension: Secondary | ICD-10-CM | POA: Diagnosis not present

## 2018-06-12 DIAGNOSIS — I519 Heart disease, unspecified: Secondary | ICD-10-CM | POA: Diagnosis not present

## 2018-06-12 DIAGNOSIS — Z23 Encounter for immunization: Secondary | ICD-10-CM | POA: Diagnosis not present

## 2018-06-12 DIAGNOSIS — Z0000 Encounter for general adult medical examination without abnormal findings: Secondary | ICD-10-CM | POA: Diagnosis not present

## 2018-06-12 DIAGNOSIS — I4510 Unspecified right bundle-branch block: Secondary | ICD-10-CM | POA: Diagnosis not present

## 2018-06-13 ENCOUNTER — Encounter (HOSPITAL_COMMUNITY): Payer: Medicare HMO

## 2018-06-17 ENCOUNTER — Ambulatory Visit (HOSPITAL_COMMUNITY)
Admission: RE | Admit: 2018-06-17 | Discharge: 2018-06-17 | Disposition: A | Discharge status: Home / Self Care | Payer: Medicare HMO | Source: Ambulatory Visit | Attending: Surgery | Admitting: Surgery

## 2018-06-17 DIAGNOSIS — N185 Chronic kidney disease, stage 5: Secondary | ICD-10-CM | POA: Insufficient documentation

## 2018-06-17 DIAGNOSIS — Z48812 Encounter for surgical aftercare following surgery on the circulatory system: Secondary | ICD-10-CM | POA: Diagnosis not present

## 2018-06-18 DIAGNOSIS — E298 Other testicular dysfunction: Secondary | ICD-10-CM | POA: Diagnosis not present

## 2018-06-20 ENCOUNTER — Telehealth: Payer: Self-pay | Admitting: *Deleted

## 2018-06-20 ENCOUNTER — Ambulatory Visit (HOSPITAL_COMMUNITY)
Admission: RE | Admit: 2018-06-20 | Discharge: 2018-06-20 | Disposition: A | Discharge status: Home / Self Care | Payer: Medicare HMO | Source: Ambulatory Visit | Attending: Nephrology | Admitting: Nephrology

## 2018-06-20 ENCOUNTER — Other Ambulatory Visit: Payer: Self-pay | Admitting: *Deleted

## 2018-06-20 VITALS — BP 140/56 | HR 60 | Temp 98.1°F | Resp 16

## 2018-06-20 DIAGNOSIS — N189 Chronic kidney disease, unspecified: Secondary | ICD-10-CM

## 2018-06-20 DIAGNOSIS — N184 Chronic kidney disease, stage 4 (severe): Secondary | ICD-10-CM | POA: Diagnosis not present

## 2018-06-20 DIAGNOSIS — Z862 Personal history of diseases of the blood and blood-forming organs and certain disorders involving the immune mechanism: Secondary | ICD-10-CM

## 2018-06-20 DIAGNOSIS — D631 Anemia in chronic kidney disease: Secondary | ICD-10-CM | POA: Insufficient documentation

## 2018-06-20 DIAGNOSIS — N185 Chronic kidney disease, stage 5: Secondary | ICD-10-CM | POA: Diagnosis present

## 2018-06-20 LAB — IRON AND TIBC
IRON: 77 ug/dL (ref 45–182)
Saturation Ratios: 29 % (ref 17.9–39.5)
TIBC: 266 ug/dL (ref 250–450)
UIBC: 189 ug/dL

## 2018-06-20 LAB — FERRITIN: FERRITIN: 327 ng/mL (ref 24–336)

## 2018-06-20 LAB — POCT HEMOGLOBIN-HEMACUE: Hemoglobin: 12.1 g/dL — ABNORMAL LOW (ref 13.0–17.0)

## 2018-06-20 MED ORDER — EPOETIN ALFA-EPBX 40000 UNIT/ML IJ SOLN: 30000.0000 [IU] | Freq: Once | INTRAMUSCULAR | Status: DC

## 2018-06-20 MED ORDER — EPOETIN ALFA-EPBX 10000 UNIT/ML IJ SOLN
30000.0000 [IU] | Freq: Once | INTRAMUSCULAR | Status: DC
  Filled 2018-06-20: qty 3

## 2018-06-20 NOTE — Telephone Encounter (Signed)
Spoke with patient's wife. Instructed to be at [facility] admitting department at 5:30 am on 07/11/18 for revision left A/V F. NPO past MN and must have a driver and caregiver for discharge to home. Expect a call and follow the detailed instructions received from the hospital pre-admission department for this surgery, medications and insulin adjustment.

## 2018-06-21 DIAGNOSIS — Z1212 Encounter for screening for malignant neoplasm of rectum: Secondary | ICD-10-CM | POA: Diagnosis not present

## 2018-06-27 ENCOUNTER — Encounter (HOSPITAL_COMMUNITY): Payer: Medicare HMO

## 2018-07-02 DIAGNOSIS — G4733 Obstructive sleep apnea (adult) (pediatric): Secondary | ICD-10-CM | POA: Diagnosis not present

## 2018-07-04 ENCOUNTER — Ambulatory Visit (HOSPITAL_COMMUNITY)
Admission: RE | Admit: 2018-07-04 | Discharge: 2018-07-04 | Disposition: A | Discharge status: Home / Self Care | Payer: Medicare HMO | Source: Ambulatory Visit | Attending: Nephrology | Admitting: Nephrology

## 2018-07-04 VITALS — BP 147/59 | HR 55 | Temp 97.5°F | Resp 20

## 2018-07-04 DIAGNOSIS — Z862 Personal history of diseases of the blood and blood-forming organs and certain disorders involving the immune mechanism: Secondary | ICD-10-CM

## 2018-07-04 DIAGNOSIS — N184 Chronic kidney disease, stage 4 (severe): Secondary | ICD-10-CM | POA: Diagnosis not present

## 2018-07-04 DIAGNOSIS — N189 Chronic kidney disease, unspecified: Secondary | ICD-10-CM

## 2018-07-04 DIAGNOSIS — D631 Anemia in chronic kidney disease: Secondary | ICD-10-CM | POA: Insufficient documentation

## 2018-07-04 MED ORDER — EPOETIN ALFA-EPBX 40000 UNIT/ML IJ SOLN
30000.0000 [IU] | Freq: Once | INTRAMUSCULAR | Status: AC
  Administered 2018-07-04: 30000 [IU] via SUBCUTANEOUS
  Filled 2018-07-04: qty 1

## 2018-07-05 LAB — POCT HEMOGLOBIN-HEMACUE: HEMOGLOBIN: 11.7 g/dL — AB (ref 13.0–17.0)

## 2018-07-09 ENCOUNTER — Encounter (HOSPITAL_COMMUNITY): Payer: Self-pay | Admitting: *Deleted

## 2018-07-09 ENCOUNTER — Other Ambulatory Visit: Payer: Self-pay

## 2018-07-09 DIAGNOSIS — M109 Gout, unspecified: Secondary | ICD-10-CM | POA: Diagnosis not present

## 2018-07-09 DIAGNOSIS — R809 Proteinuria, unspecified: Secondary | ICD-10-CM | POA: Diagnosis not present

## 2018-07-09 DIAGNOSIS — N400 Enlarged prostate without lower urinary tract symptoms: Secondary | ICD-10-CM | POA: Diagnosis not present

## 2018-07-09 DIAGNOSIS — E038 Other specified hypothyroidism: Secondary | ICD-10-CM | POA: Diagnosis not present

## 2018-07-09 DIAGNOSIS — E1122 Type 2 diabetes mellitus with diabetic chronic kidney disease: Secondary | ICD-10-CM | POA: Diagnosis not present

## 2018-07-09 DIAGNOSIS — N2581 Secondary hyperparathyroidism of renal origin: Secondary | ICD-10-CM | POA: Diagnosis not present

## 2018-07-09 DIAGNOSIS — D631 Anemia in chronic kidney disease: Secondary | ICD-10-CM | POA: Diagnosis not present

## 2018-07-09 DIAGNOSIS — E785 Hyperlipidemia, unspecified: Secondary | ICD-10-CM | POA: Diagnosis not present

## 2018-07-09 DIAGNOSIS — N184 Chronic kidney disease, stage 4 (severe): Secondary | ICD-10-CM | POA: Diagnosis not present

## 2018-07-09 DIAGNOSIS — I129 Hypertensive chronic kidney disease with stage 1 through stage 4 chronic kidney disease, or unspecified chronic kidney disease: Secondary | ICD-10-CM | POA: Diagnosis not present

## 2018-07-09 NOTE — Progress Notes (Signed)
Spoke with pt for pre-op call. Pt is a type 2 diabetic. Last A1C was "6 something" per pt. States his fasting blood sugar is usually between 80-120. Instructed him to take 70% of is Novolin 70/30 Insulin this evening (will take 11 units) and none in the AM. Instructed pt to check his blood sugar when he gets up and every 2 hours until he leaves for the hospital. If blood sugar is >220 take 1/2 of usual correction dose of Novolog insulin. If blood sugar is 70 or below, treat with 1/2 cup of clear juice (apple or cranberry) and recheck blood sugar 15 minutes after drinking juice. If blood sugar continues to be 70 or below, call the Short Stay department and ask to speak to a nurse. Pt voiced understanding.

## 2018-07-10 MED ORDER — VANCOMYCIN HCL 10 G IV SOLR
1500.0000 mg | INTRAVENOUS | Status: AC
  Administered 2018-07-11: 1500 mg via INTRAVENOUS
  Filled 2018-07-10: qty 1500

## 2018-07-11 ENCOUNTER — Encounter (HOSPITAL_COMMUNITY): Payer: Medicare HMO

## 2018-07-11 ENCOUNTER — Encounter (HOSPITAL_COMMUNITY)
Admission: RE | Disposition: A | Discharge status: Home / Self Care | Payer: Self-pay | Source: Ambulatory Visit | Attending: Vascular Surgery

## 2018-07-11 ENCOUNTER — Ambulatory Visit (HOSPITAL_COMMUNITY): Payer: Medicare HMO | Admitting: Registered Nurse

## 2018-07-11 ENCOUNTER — Telehealth: Payer: Self-pay | Admitting: Vascular Surgery

## 2018-07-11 ENCOUNTER — Encounter (HOSPITAL_COMMUNITY): Payer: Self-pay

## 2018-07-11 ENCOUNTER — Ambulatory Visit (HOSPITAL_COMMUNITY)
Admission: RE | Admit: 2018-07-11 | Discharge: 2018-07-11 | Disposition: A | Discharge status: Home / Self Care | Payer: Medicare HMO | Source: Ambulatory Visit | Attending: Vascular Surgery | Admitting: Vascular Surgery

## 2018-07-11 DIAGNOSIS — I129 Hypertensive chronic kidney disease with stage 1 through stage 4 chronic kidney disease, or unspecified chronic kidney disease: Secondary | ICD-10-CM | POA: Diagnosis not present

## 2018-07-11 DIAGNOSIS — Z7951 Long term (current) use of inhaled steroids: Secondary | ICD-10-CM | POA: Diagnosis not present

## 2018-07-11 DIAGNOSIS — T82898A Other specified complication of vascular prosthetic devices, implants and grafts, initial encounter: Secondary | ICD-10-CM | POA: Diagnosis not present

## 2018-07-11 DIAGNOSIS — N189 Chronic kidney disease, unspecified: Secondary | ICD-10-CM | POA: Diagnosis not present

## 2018-07-11 DIAGNOSIS — N185 Chronic kidney disease, stage 5: Secondary | ICD-10-CM | POA: Diagnosis not present

## 2018-07-11 DIAGNOSIS — Z940 Kidney transplant status: Secondary | ICD-10-CM | POA: Insufficient documentation

## 2018-07-11 DIAGNOSIS — Z87891 Personal history of nicotine dependence: Secondary | ICD-10-CM | POA: Insufficient documentation

## 2018-07-11 DIAGNOSIS — G4730 Sleep apnea, unspecified: Secondary | ICD-10-CM | POA: Diagnosis not present

## 2018-07-11 DIAGNOSIS — Z7982 Long term (current) use of aspirin: Secondary | ICD-10-CM | POA: Insufficient documentation

## 2018-07-11 DIAGNOSIS — E1122 Type 2 diabetes mellitus with diabetic chronic kidney disease: Secondary | ICD-10-CM | POA: Diagnosis not present

## 2018-07-11 DIAGNOSIS — Z794 Long term (current) use of insulin: Secondary | ICD-10-CM | POA: Diagnosis not present

## 2018-07-11 DIAGNOSIS — Z880 Allergy status to penicillin: Secondary | ICD-10-CM | POA: Diagnosis not present

## 2018-07-11 DIAGNOSIS — Z79899 Other long term (current) drug therapy: Secondary | ICD-10-CM | POA: Diagnosis not present

## 2018-07-11 DIAGNOSIS — N184 Chronic kidney disease, stage 4 (severe): Secondary | ICD-10-CM | POA: Diagnosis not present

## 2018-07-11 DIAGNOSIS — E785 Hyperlipidemia, unspecified: Secondary | ICD-10-CM | POA: Diagnosis not present

## 2018-07-11 HISTORY — DX: Sleep apnea, unspecified: G47.30

## 2018-07-11 HISTORY — PX: REVISION OF ARTERIOVENOUS GORETEX GRAFT: SHX6073

## 2018-07-11 HISTORY — DX: Anemia, unspecified: D64.9

## 2018-07-11 LAB — POCT I-STAT 4, (NA,K, GLUC, HGB,HCT)
Glucose, Bld: 120 mg/dL — ABNORMAL HIGH (ref 70–99)
HCT: 38 % — ABNORMAL LOW (ref 39.0–52.0)
HEMOGLOBIN: 12.9 g/dL — AB (ref 13.0–17.0)
Potassium: 4.2 mmol/L (ref 3.5–5.1)
Sodium: 139 mmol/L (ref 135–145)

## 2018-07-11 LAB — GLUCOSE, CAPILLARY
GLUCOSE-CAPILLARY: 102 mg/dL — AB (ref 70–99)
Glucose-Capillary: 120 mg/dL — ABNORMAL HIGH (ref 70–99)

## 2018-07-11 LAB — NO BLOOD PRODUCTS

## 2018-07-11 SURGERY — REVISION OF ARTERIOVENOUS GORETEX GRAFT
Anesthesia: Monitor Anesthesia Care | Site: Arm Lower | Laterality: Left

## 2018-07-11 MED ORDER — SODIUM CHLORIDE 0.9 % IV SOLN
INTRAVENOUS | Status: DC | PRN
  Administered 2018-07-11: 500 mL

## 2018-07-11 MED ORDER — CHLORHEXIDINE GLUCONATE 4 % EX LIQD: 60.0000 mL | Freq: Once | CUTANEOUS | Status: DC

## 2018-07-11 MED ORDER — SODIUM CHLORIDE 0.9 % IV SOLN
INTRAVENOUS | Status: DC
  Administered 2018-07-11: 08:00:00 via INTRAVENOUS

## 2018-07-11 MED ORDER — LIDOCAINE-EPINEPHRINE 0.5 %-1:200000 IJ SOLN
INTRAMUSCULAR | Status: DC | PRN
  Administered 2018-07-11: 12 mL

## 2018-07-11 MED ORDER — FENTANYL CITRATE (PF) 250 MCG/5ML IJ SOLN
INTRAMUSCULAR | Status: AC
  Filled 2018-07-11: qty 5

## 2018-07-11 MED ORDER — SODIUM CHLORIDE 0.9 % IV SOLN
INTRAVENOUS | Status: AC
  Filled 2018-07-11: qty 1.2

## 2018-07-11 MED ORDER — LIDOCAINE-EPINEPHRINE 0.5 %-1:200000 IJ SOLN
INTRAMUSCULAR | Status: AC
  Filled 2018-07-11: qty 1

## 2018-07-11 MED ORDER — FENTANYL CITRATE (PF) 250 MCG/5ML IJ SOLN
INTRAMUSCULAR | Status: DC | PRN
  Administered 2018-07-11: 50 ug via INTRAVENOUS

## 2018-07-11 MED ORDER — LIDOCAINE-EPINEPHRINE (PF) 1 %-1:200000 IJ SOLN
INTRAMUSCULAR | Status: AC
  Filled 2018-07-11: qty 30

## 2018-07-11 MED ORDER — ONDANSETRON HCL 4 MG/2ML IJ SOLN
INTRAMUSCULAR | Status: AC
  Filled 2018-07-11: qty 2

## 2018-07-11 MED ORDER — PROPOFOL 10 MG/ML IV BOLUS
INTRAVENOUS | Status: AC
  Filled 2018-07-11: qty 20

## 2018-07-11 MED ORDER — PROMETHAZINE HCL 25 MG/ML IJ SOLN: 6.2500 mg | INTRAMUSCULAR | Status: DC | PRN

## 2018-07-11 MED ORDER — LIDOCAINE HCL (PF) 1 % IJ SOLN
INTRAMUSCULAR | Status: AC
  Filled 2018-07-11: qty 30

## 2018-07-11 MED ORDER — OXYCODONE-ACETAMINOPHEN 5-325 MG PO TABS: 1.0000 | ORAL_TABLET | Freq: Four times a day (QID) | ORAL | 0 refills | Status: DC | PRN

## 2018-07-11 MED ORDER — LACTATED RINGERS IV SOLN: INTRAVENOUS | Status: DC

## 2018-07-11 MED ORDER — 0.9 % SODIUM CHLORIDE (POUR BTL) OPTIME
TOPICAL | Status: DC | PRN
  Administered 2018-07-11: 1000 mL

## 2018-07-11 MED ORDER — ONDANSETRON HCL 4 MG/2ML IJ SOLN
INTRAMUSCULAR | Status: DC | PRN
  Administered 2018-07-11: 4 mg via INTRAVENOUS

## 2018-07-11 MED ORDER — PROPOFOL 500 MG/50ML IV EMUL
INTRAVENOUS | Status: DC | PRN
  Administered 2018-07-11: 75 ug/kg/min via INTRAVENOUS

## 2018-07-11 MED ORDER — PROPOFOL 10 MG/ML IV BOLUS
INTRAVENOUS | Status: DC | PRN
  Administered 2018-07-11: 50 mg via INTRAVENOUS

## 2018-07-11 MED ORDER — FENTANYL CITRATE (PF) 100 MCG/2ML IJ SOLN: 25.0000 ug | INTRAMUSCULAR | Status: DC | PRN

## 2018-07-11 MED ORDER — LIDOCAINE 2% (20 MG/ML) 5 ML SYRINGE
INTRAMUSCULAR | Status: DC | PRN
  Administered 2018-07-11: 100 mg via INTRAVENOUS

## 2018-07-11 MED ORDER — MEPERIDINE HCL 50 MG/ML IJ SOLN: 6.2500 mg | INTRAMUSCULAR | Status: DC | PRN

## 2018-07-11 SURGICAL SUPPLY — 36 items
CANISTER SUCT 3000ML PPV (MISCELLANEOUS) ×3 IMPLANT
CANNULA VESSEL 3MM 2 BLNT TIP (CANNULA) ×3 IMPLANT
CLIP LIGATING EXTRA MED SLVR (CLIP) ×3 IMPLANT
CLIP LIGATING EXTRA SM BLUE (MISCELLANEOUS) ×3 IMPLANT
COVER PROBE W GEL 5X96 (DRAPES) ×3 IMPLANT
COVER WAND RF STERILE (DRAPES) ×3 IMPLANT
DECANTER SPIKE VIAL GLASS SM (MISCELLANEOUS) ×3 IMPLANT
DERMABOND ADVANCED (GAUZE/BANDAGES/DRESSINGS) ×6
DERMABOND ADVANCED .7 DNX12 (GAUZE/BANDAGES/DRESSINGS) ×3 IMPLANT
ELECT REM PT RETURN 9FT ADLT (ELECTROSURGICAL) ×3
ELECTRODE REM PT RTRN 9FT ADLT (ELECTROSURGICAL) ×1 IMPLANT
GLOVE BIO SURGEON STRL SZ 6.5 (GLOVE) ×2 IMPLANT
GLOVE BIO SURGEONS STRL SZ 6.5 (GLOVE) ×1
GLOVE BIOGEL PI IND STRL 6.5 (GLOVE) ×1 IMPLANT
GLOVE BIOGEL PI IND STRL 7.5 (GLOVE) ×2 IMPLANT
GLOVE BIOGEL PI INDICATOR 6.5 (GLOVE) ×2
GLOVE BIOGEL PI INDICATOR 7.5 (GLOVE) ×4
GLOVE ECLIPSE 7.0 STRL STRAW (GLOVE) ×3 IMPLANT
GLOVE SS BIOGEL STRL SZ 7.5 (GLOVE) ×1 IMPLANT
GLOVE SUPERSENSE BIOGEL SZ 7.5 (GLOVE) ×2
GOWN STRL REUS W/ TWL LRG LVL3 (GOWN DISPOSABLE) ×6 IMPLANT
GOWN STRL REUS W/TWL LRG LVL3 (GOWN DISPOSABLE) ×12
KIT BASIN OR (CUSTOM PROCEDURE TRAY) ×3 IMPLANT
KIT TURNOVER KIT B (KITS) ×3 IMPLANT
NEEDLE HYPO 25GX1X1/2 BEV (NEEDLE) ×3 IMPLANT
NS IRRIG 1000ML POUR BTL (IV SOLUTION) ×3 IMPLANT
PACK CV ACCESS (CUSTOM PROCEDURE TRAY) ×3 IMPLANT
PAD ARMBOARD 7.5X6 YLW CONV (MISCELLANEOUS) ×6 IMPLANT
SUT PROLENE 6 0 CC (SUTURE) ×6 IMPLANT
SUT SILK 2 0 PERMA HAND 18 BK (SUTURE) IMPLANT
SUT SILK 2 0 SH (SUTURE) ×3 IMPLANT
SUT VIC AB 3-0 SH 27 (SUTURE) ×4
SUT VIC AB 3-0 SH 27X BRD (SUTURE) ×2 IMPLANT
TOWEL GREEN STERILE (TOWEL DISPOSABLE) ×3 IMPLANT
UNDERPAD 30X30 (UNDERPADS AND DIAPERS) ×3 IMPLANT
WATER STERILE IRR 1000ML POUR (IV SOLUTION) ×3 IMPLANT

## 2018-07-11 NOTE — Telephone Encounter (Signed)
sch appt lvm 08/12/18 1pm p/o PA

## 2018-07-11 NOTE — Anesthesia Preprocedure Evaluation (Addendum)
Anesthesia Evaluation  Patient identified by MRN, date of birth, ID band Patient awake    Reviewed: Allergy & Precautions, NPO status , Patient's Chart, lab work & pertinent test results  Airway Mallampati: I  TM Distance: >3 FB Neck ROM: Full    Dental  (+) Edentulous Upper, Poor Dentition, Chipped, Missing,    Pulmonary sleep apnea , former smoker,    breath sounds clear to auscultation       Cardiovascular hypertension, Pt. on medications and Pt. on home beta blockers  Rhythm:Regular Rate:Normal     Neuro/Psych negative neurological ROS     GI/Hepatic negative GI ROS, Neg liver ROS,   Endo/Other  diabetes, Type 2, Insulin DependentHypothyroidism   Renal/GU Renal disease     Musculoskeletal   Abdominal (+) + obese,   Peds  Hematology negative hematology ROS (+)   Anesthesia Other Findings   Reproductive/Obstetrics                            Lab Results  Component Value Date   HGB 12.9 (L) 07/11/2018   HCT 38.0 (L) 07/11/2018   EKG: sinus bradycardia.  Anesthesia Physical Anesthesia Plan  ASA: III  Anesthesia Plan: MAC   Post-op Pain Management:    Induction: Intravenous  PONV Risk Score and Plan: 2 and Ondansetron and Propofol infusion  Airway Management Planned: Natural Airway and Simple Face Mask  Additional Equipment: None  Intra-op Plan:   Post-operative Plan:   Informed Consent: I have reviewed the patients History and Physical, chart, labs and discussed the procedure including the risks, benefits and alternatives for the proposed anesthesia with the patient or authorized representative who has indicated his/her understanding and acceptance.     Plan Discussed with: CRNA  Anesthesia Plan Comments:        Anesthesia Quick Evaluation

## 2018-07-11 NOTE — Discharge Instructions (Signed)
° °  [facility] [place] ° °Discharge Instructions ° °AV Fistula or Graft Surgery for Dialysis Access ° °Please refer to the following instructions for your post-procedure care. Your surgeon or physician assistant will discuss any changes with you. ° °Activity ° °You may drive the day following your surgery, if you are comfortable and no longer taking prescription pain medication. Resume full activity as the soreness in your incision resolves. ° °Bathing/Showering ° °You may shower after you go home. Keep your incision dry for 48 hours. Do not soak in a bathtub, hot tub, or swim until the incision heals completely. You may not shower if you have a hemodialysis catheter. ° °Incision Care ° °Clean your incision with mild soap and water after 48 hours. Pat the area dry with a clean towel. You do not need a bandage unless otherwise instructed. Do not apply any ointments or creams to your incision. You may have skin glue on your incision. Do not peel it off. It will come off on its own in about one week. Your arm may swell a bit after surgery. To reduce swelling use pillows to elevate your arm so it is above your heart. Your doctor will tell you if you need to lightly wrap your arm with an ACE bandage. ° °Diet ° °Resume your normal diet. There are not special food restrictions following this procedure. In order to heal from your surgery, it is CRITICAL to get adequate nutrition. Your body requires vitamins, minerals, and protein. Vegetables are the best source of vitamins and minerals. Vegetables also provide the perfect balance of protein. Processed food has little nutritional value, so try to avoid this. ° °Medications ° °Resume taking all of your medications. If your incision is causing pain, you may take over-the counter pain relievers such as acetaminophen (Tylenol). If you were prescribed a stronger pain medication, please be aware these medications can cause nausea and constipation. Prevent  nausea by taking the medication with a snack or meal. Avoid constipation by drinking plenty of fluids and eating foods with high amount of fiber, such as fruits, vegetables, and grains. Do not take Tylenol if you are taking prescription pain medications. ° ° ° ° °Follow up °Your surgeon may want to see you in the office following your access surgery. If so, this will be arranged at the time of your surgery. ° °Please call us immediately for any of the following conditions: ° °Increased pain, redness, drainage (pus) from your incision site °Fever of 101 degrees or higher °Severe or worsening pain at your incision site °Hand pain or numbness. ° °Reduce your risk of vascular disease: ° °Stop smoking. If you would like help, call [facility] at 1-800-QUIT-NOW (1-800-784-8669) or [place] at [phone] ° °Manage your cholesterol °Maintain a desired weight °Control your diabetes °Keep your blood pressure down ° °Dialysis ° °It will take several weeks to several months for your new dialysis access to be ready for use. Your surgeon will determine when it is OK to use it. Your nephrologist will continue to direct your dialysis. You can continue to use your Permcath until your new access is ready for use. ° °If you have any questions, please call the office at [phone]. ° °

## 2018-07-11 NOTE — Op Note (Signed)
    OPERATIVE REPORT  DATE OF SURGERY: 07/11/2018  PATIENT: David Nguyen, 77 y.o. male MRN: [id number]  DOB: [birth year]  PRE-OPERATIVE DIAGNOSIS: Chronic renal insufficiency  POST-OPERATIVE DIAGNOSIS:  Same  PROCEDURE: Translocation of left radiocephalic AV fistula  SURGEON:  Curt Jews, M.D.  PHYSICIAN ASSISTANT: Laurence Slate, PA-C  ANESTHESIA: Local with sedation  EBL: per anesthesia record  Total I/O In: 150 [I.V.:150] Out: 10 [Blood:10]  BLOOD ADMINISTERED: none  DRAINS: none  SPECIMEN: none  COUNTS CORRECT:  YES  PATIENT DISPOSITION:  PACU - hemodynamically stable  PROCEDURE DETAILS: Patient was taken to the operative placed supine position of the area of the left arm prepped draped you sterile fashion.  SonoSite ultrasound was used to visualize the cephalic vein and also the tributary branches of the vein.  3 small incisions were made at the wrist mid arm and antecubital space.  The veins were mobilized circumferentially and tributary branches were ligated with 3-0 silk ties and divided.  The vein was occluded near the brachial artery anastomosis.  The vein was transected and brought out through the tunnel.  It was marked to prevent twisting.  A tunnel was created from the level of the radial artery anastomosis to the antecubital space and the vein was brought through the subcutaneous tunnel.  The vein was spatulated and sewn into into itself with a running 6-0 Prolene suture.  Clamps removed and excellent thrill was noted.  Irrigated with saline.  Hemostasis talus cautery.  Wounds were closed with 3-0 Vicryl in the subcutaneous and subcuticular tissue.  Sterile dressing was applied and the patient was transferred to the recovery room in stable condition   [name], M.D., [facility] 07/11/2018 12:36 PM

## 2018-07-11 NOTE — Anesthesia Postprocedure Evaluation (Signed)
Anesthesia Post Note  Patient: KEKOA FYOCK  Procedure(s) Performed: TRANSPOSITION OF RADIOCEPHALIC  ARTERIOVENOUS FISTULA LEFT ARM (Left Arm Lower)     Patient location during evaluation: PACU Anesthesia Type: MAC Level of consciousness: awake and alert Pain management: pain level controlled Vital Signs Assessment: post-procedure vital signs reviewed and stable Respiratory status: spontaneous breathing, nonlabored ventilation, respiratory function stable and patient connected to nasal cannula oxygen Cardiovascular status: stable and blood pressure returned to baseline Postop Assessment: no apparent nausea or vomiting Anesthetic complications: no    Last Vitals:  Vitals:   07/11/18 1309 07/11/18 1319  BP: (!) 152/63 (!) 155/60  Pulse: (!) 53 (!) 53  Resp: 17 16  Temp: (!) 36.4 C   SpO2: 97% 100%    Last Pain:  Vitals:   07/11/18 1319  TempSrc:   PainSc: 0-No pain                 Effie Berkshire

## 2018-07-11 NOTE — Interval H&P Note (Signed)
History and Physical Interval Note:  07/11/2018 7:50 AM  David Nguyen  has presented today for surgery, with the diagnosis of COMPLICATION WITH LEFT ARM ARTERIOVENOUS FISTULA  The various methods of treatment have been discussed with the patient and family. After consideration of risks, benefits and other options for treatment, the patient has consented to  Procedure(s): SUPERFICIALIZATION VERSUS TRANSPOSITION OF RADIOCEPHALIC  ARTERIOVENOUS FISTULA LEFT ARM (Left) as a surgical intervention .  The patient's history has been reviewed, patient examined, no change in status, stable for surgery.  I have reviewed the patient's chart and labs.  Questions were answered to the patient's satisfaction.     Curt Jews

## 2018-07-11 NOTE — Transfer of Care (Signed)
Immediate Anesthesia Transfer of Care Note  Patient: David Nguyen  Procedure(s) Performed: TRANSPOSITION OF RADIOCEPHALIC  ARTERIOVENOUS FISTULA LEFT ARM (Left Arm Lower)  Patient Location: PACU  Anesthesia Type:MAC  Level of Consciousness: awake, alert  and oriented  Airway & Oxygen Therapy: Patient Spontanous Breathing and Patient connected to face mask oxygen  Post-op Assessment: Report given to RN, Post -op Vital signs reviewed and stable and Patient moving all extremities  Post vital signs: Reviewed and stable  Last Vitals:  Vitals Value Taken Time  BP 138/61   Temp    Pulse 54 07/11/2018 12:38 PM  Resp 17 07/11/2018 12:38 PM  SpO2 99 % 07/11/2018 12:38 PM  Vitals shown include unvalidated device data.  Last Pain:  Vitals:   07/11/18 0801  TempSrc: Oral  PainSc: 0-No pain         Complications: No apparent anesthesia complications

## 2018-07-12 ENCOUNTER — Encounter (HOSPITAL_COMMUNITY): Payer: Self-pay | Admitting: Vascular Surgery

## 2018-07-25 ENCOUNTER — Encounter (HOSPITAL_COMMUNITY)
Admission: RE | Admit: 2018-07-25 | Discharge: 2018-07-25 | Disposition: A | Discharge status: Home / Self Care | Payer: Medicare HMO | Source: Ambulatory Visit | Attending: Nephrology | Admitting: Nephrology

## 2018-07-25 VITALS — BP 157/72 | HR 57

## 2018-07-25 DIAGNOSIS — Z862 Personal history of diseases of the blood and blood-forming organs and certain disorders involving the immune mechanism: Secondary | ICD-10-CM

## 2018-07-25 DIAGNOSIS — D631 Anemia in chronic kidney disease: Secondary | ICD-10-CM | POA: Insufficient documentation

## 2018-07-25 DIAGNOSIS — N189 Chronic kidney disease, unspecified: Secondary | ICD-10-CM

## 2018-07-25 DIAGNOSIS — N184 Chronic kidney disease, stage 4 (severe): Secondary | ICD-10-CM | POA: Insufficient documentation

## 2018-07-25 LAB — POCT HEMOGLOBIN-HEMACUE: Hemoglobin: 12.2 g/dL — ABNORMAL LOW (ref 13.0–17.0)

## 2018-07-25 LAB — FERRITIN: FERRITIN: 230 ng/mL (ref 24–336)

## 2018-07-25 LAB — IRON AND TIBC
IRON: 58 ug/dL (ref 45–182)
Saturation Ratios: 21 % (ref 17.9–39.5)
TIBC: 281 ug/dL (ref 250–450)
UIBC: 223 ug/dL

## 2018-07-25 MED ORDER — EPOETIN ALFA-EPBX 10000 UNIT/ML IJ SOLN
30000.0000 [IU] | INTRAMUSCULAR | Status: DC
  Filled 2018-07-25: qty 3

## 2018-08-01 DIAGNOSIS — G4733 Obstructive sleep apnea (adult) (pediatric): Secondary | ICD-10-CM | POA: Diagnosis not present

## 2018-08-07 ENCOUNTER — Other Ambulatory Visit (HOSPITAL_COMMUNITY): Payer: Self-pay | Admitting: *Deleted

## 2018-08-08 ENCOUNTER — Encounter (HOSPITAL_COMMUNITY)
Admission: RE | Admit: 2018-08-08 | Discharge: 2018-08-08 | Disposition: A | Discharge status: Home / Self Care | Payer: Medicare HMO | Source: Ambulatory Visit | Attending: Nephrology | Admitting: Nephrology

## 2018-08-08 VITALS — BP 173/65 | HR 60 | Temp 98.6°F | Resp 20 | Ht 71.0 in | Wt 270.0 lb

## 2018-08-08 DIAGNOSIS — Z862 Personal history of diseases of the blood and blood-forming organs and certain disorders involving the immune mechanism: Secondary | ICD-10-CM

## 2018-08-08 DIAGNOSIS — D631 Anemia in chronic kidney disease: Secondary | ICD-10-CM | POA: Diagnosis not present

## 2018-08-08 DIAGNOSIS — N184 Chronic kidney disease, stage 4 (severe): Secondary | ICD-10-CM | POA: Diagnosis not present

## 2018-08-08 DIAGNOSIS — N189 Chronic kidney disease, unspecified: Principal | ICD-10-CM

## 2018-08-08 LAB — POCT HEMOGLOBIN-HEMACUE: HEMOGLOBIN: 11.7 g/dL — AB (ref 13.0–17.0)

## 2018-08-08 MED ORDER — EPOETIN ALFA-EPBX 40000 UNIT/ML IJ SOLN: 30000.0000 [IU] | INTRAMUSCULAR | Status: DC

## 2018-08-08 MED ORDER — EPOETIN ALFA-EPBX 10000 UNIT/ML IJ SOLN
30000.0000 [IU] | Freq: Once | INTRAMUSCULAR | Status: AC
  Administered 2018-08-08: 30000 [IU] via SUBCUTANEOUS
  Filled 2018-08-08: qty 3

## 2018-08-08 MED ORDER — SODIUM CHLORIDE 0.9 % IV SOLN
510.0000 mg | Freq: Once | INTRAVENOUS | Status: AC
  Administered 2018-08-08: 510 mg via INTRAVENOUS
  Filled 2018-08-08: qty 17

## 2018-08-12 ENCOUNTER — Ambulatory Visit (INDEPENDENT_AMBULATORY_CARE_PROVIDER_SITE_OTHER): Payer: Self-pay | Admitting: Physician Assistant

## 2018-08-12 ENCOUNTER — Other Ambulatory Visit: Payer: Self-pay

## 2018-08-12 VITALS — BP 162/68 | HR 53 | Temp 97.5°F | Resp 18 | Ht 72.0 in | Wt 276.0 lb

## 2018-08-12 DIAGNOSIS — N184 Chronic kidney disease, stage 4 (severe): Secondary | ICD-10-CM

## 2018-08-12 DIAGNOSIS — E1122 Type 2 diabetes mellitus with diabetic chronic kidney disease: Secondary | ICD-10-CM

## 2018-08-12 NOTE — Progress Notes (Signed)
POST OPERATIVE OFFICE NOTE    CC:  F/u for surgery  HPI:  This is a 77 y.o. male who is s/p left RC AVF on 05/06/18 by Dr. Donnetta Hutching and subsequently underwent a translocation of his fistula on 07/11/18 also by Dr. Donnetta Hutching.  He presents today for follow up.  He denies any pain in his hand or arm.  He is not yet on dialysis.  His nephrologist is Dr. Jimmy Footman.    Allergies  Allergen Reactions  . Penicillins Other (See Comments)    UNSPECIFIED REACTION  Has patient had a PCN reaction causing immediate rash, facial/tongue/throat swelling, SOB or lightheadedness with hypotension: Unknown Has patient had a PCN reaction causing severe rash involving mucus membranes or skin necrosis: Unknown Has patient had a PCN reaction that required hospitalization: Unknown Has patient had a PCN reaction occurring within the last 10 years: No If all of the above answers are "NO", then may proceed with Cephalosporin use.     Current Outpatient Medications  Medication Sig Dispense Refill  . allopurinol (ZYLOPRIM) 100 MG tablet Take 200 mg by mouth daily.     Marland Kitchen amLODipine (NORVASC) 10 MG tablet Take 10 mg by mouth every evening.     Marland Kitchen aspirin 81 MG tablet Take 81 mg by mouth daily.      Marland Kitchen atorvastatin (LIPITOR) 40 MG tablet Take 40 mg by mouth daily.    . calcitRIOL (ROCALTROL) 0.25 MCG capsule Take 0.25 mcg by mouth daily.     . carvedilol (COREG) 25 MG tablet Take 12.5 mg by mouth 2 (two) times daily with a meal.     . doxazosin (CARDURA) 8 MG tablet Take 8 mg by mouth at bedtime.      . fluticasone (FLONASE) 50 MCG/ACT nasal spray Place 2 sprays into both nostrils daily.   11  . furosemide (LASIX) 80 MG tablet Take 80 mg by mouth 2 (two) times daily.     . Homeopathic Products [facility] ALLERGY EYE RELIEF) SOLN Place 1-2 drops into both eyes daily as needed (for dry eyes).    . insulin NPH-regular Human (NOVOLIN 70/30) (70-30) 100 UNIT/ML injection Inject 16-20 Units into the skin See admin instructions.  Inject 20 units SQ in the morning and inject 16 units SQ at bedtime    . levothyroxine (SYNTHROID, LEVOTHROID) 75 MCG tablet Take 75 mcg by mouth daily before breakfast.    . lisinopril (PRINIVIL,ZESTRIL) 40 MG tablet Take 40 mg by mouth at bedtime.     . montelukast (SINGULAIR) 10 MG tablet Take 10 mg by mouth daily.     . Multiple Vitamin (MULTIVITAMIN WITH MINERALS) TABS tablet Take 1 tablet by mouth daily.    Marland Kitchen oxyCODONE-acetaminophen (PERCOCET/ROXICET) 5-325 MG tablet Take 1 tablet by mouth every 6 (six) hours as needed. 12 tablet 0  . simvastatin (ZOCOR) 40 MG tablet Take 40 mg by mouth at bedtime.      Marland Kitchen testosterone cypionate (DEPOTESTOSTERONE CYPIONATE) 200 MG/ML injection Inject 200 mg into the muscle every 21 ( twenty-one) days.     No current facility-administered medications for this visit.      ROS:  See HPI  Physical Exam:  Today's Vitals   08/12/18 1247  BP: (!) 162/68  Pulse: (!) 53  Resp: 18  Temp: (!) 97.5 F (36.4 C)  TempSrc: Oral  SpO2: 98%  Weight: 276 lb (125.2 kg)  Height: 6' (1.829 m)   Body mass index is 37.43 kg/m.  Incision:  All incisions have  healed nicely Extremities:  There is a palpable left radial pulse; there is an excellent thrill/bruit in the fistula at the wrist but the thrill and bruit decreases proximally up the arm.     Assessment/Plan:  This is a 77 y.o. male who is s/p: left RC AVF on 05/06/18 by Dr. Donnetta Hutching and subsequently underwent a translocation of his fistula on 07/11/18 also by Dr. Donnetta Hutching  -pt's fistula has an excellent thrill/bruit in the fistula at the wrist but the thrill and bruit decreases proximally up the arm.  On pre-op duplex of his fistula, it measured 0.39cm in the mid forearm.   Since he is not yet on dialysis, we will see him back in 4 weeks with a duplex to check the maturity of his fistula.  Will hold on fistulogram at this time since he is not on dialysis.  Pt expresses understanding and is in agreement.     Leontine Locket, PA-C [facility] [phone]  Clinic MD:  Trula Slade

## 2018-08-13 DIAGNOSIS — E038 Other specified hypothyroidism: Secondary | ICD-10-CM | POA: Diagnosis not present

## 2018-08-15 ENCOUNTER — Encounter (HOSPITAL_COMMUNITY): Payer: Medicare HMO

## 2018-08-16 ENCOUNTER — Other Ambulatory Visit: Payer: Self-pay

## 2018-08-16 DIAGNOSIS — N185 Chronic kidney disease, stage 5: Secondary | ICD-10-CM

## 2018-08-29 ENCOUNTER — Encounter (HOSPITAL_COMMUNITY)
Admission: RE | Admit: 2018-08-29 | Discharge: 2018-08-29 | Disposition: A | Discharge status: Home / Self Care | Payer: Medicare HMO | Source: Ambulatory Visit | Attending: Nephrology | Admitting: Nephrology

## 2018-08-29 VITALS — BP 154/68 | HR 67 | Temp 98.0°F | Resp 20

## 2018-08-29 DIAGNOSIS — N184 Chronic kidney disease, stage 4 (severe): Secondary | ICD-10-CM | POA: Diagnosis not present

## 2018-08-29 DIAGNOSIS — N189 Chronic kidney disease, unspecified: Secondary | ICD-10-CM

## 2018-08-29 DIAGNOSIS — D631 Anemia in chronic kidney disease: Secondary | ICD-10-CM | POA: Insufficient documentation

## 2018-08-29 DIAGNOSIS — Z862 Personal history of diseases of the blood and blood-forming organs and certain disorders involving the immune mechanism: Secondary | ICD-10-CM

## 2018-08-29 LAB — IRON AND TIBC
Iron: 80 ug/dL (ref 45–182)
Saturation Ratios: 32 % (ref 17.9–39.5)
TIBC: 249 ug/dL — ABNORMAL LOW (ref 250–450)
UIBC: 169 ug/dL

## 2018-08-29 LAB — FERRITIN: Ferritin: 322 ng/mL (ref 24–336)

## 2018-08-29 MED ORDER — EPOETIN ALFA-EPBX 40000 UNIT/ML IJ SOLN
30000.0000 [IU] | INTRAMUSCULAR | Status: DC
  Filled 2018-08-29: qty 1

## 2018-08-30 LAB — POCT HEMOGLOBIN-HEMACUE: HEMOGLOBIN: 12.4 g/dL — AB (ref 13.0–17.0)

## 2018-09-06 NOTE — Progress Notes (Deleted)
POST OPERATIVE OFFICE NOTE    CC:  F/u for surgery  HPI:  This is a 77 y.o. male who is s/p left RC AVF on 05/06/18 by Dr. Donnetta Hutching and subsequently underwent a translocation of his fistula on 07/11/18 also by Dr. Donnetta Hutching.  At his post op visit on 08/12/18, he had an excellent thrill/bruit in the fistula at the wrist, but the thrill and bruit decreased proximally up the arm.  On his pre-op duplex, the fistula measured 0.39cm in the forearm.  Since he was not yet on HD, he would return in a month with repeat duplex to check the maturity of the fistula.  ***  Allergies  Allergen Reactions  . Penicillins Other (See Comments)    UNSPECIFIED REACTION  Has patient had a PCN reaction causing immediate rash, facial/tongue/throat swelling, SOB or lightheadedness with hypotension: Unknown Has patient had a PCN reaction causing severe rash involving mucus membranes or skin necrosis: Unknown Has patient had a PCN reaction that required hospitalization: Unknown Has patient had a PCN reaction occurring within the last 10 years: No If all of the above answers are "NO", then may proceed with Cephalosporin use.     Current Outpatient Medications  Medication Sig Dispense Refill  . allopurinol (ZYLOPRIM) 100 MG tablet Take 200 mg by mouth daily.     Marland Kitchen amLODipine (NORVASC) 10 MG tablet Take 10 mg by mouth every evening.     Marland Kitchen aspirin 81 MG tablet Take 81 mg by mouth daily.      Marland Kitchen atorvastatin (LIPITOR) 40 MG tablet Take 40 mg by mouth daily.    . calcitRIOL (ROCALTROL) 0.25 MCG capsule Take 0.25 mcg by mouth daily.     . carvedilol (COREG) 25 MG tablet Take 12.5 mg by mouth 2 (two) times daily with a meal.     . doxazosin (CARDURA) 8 MG tablet Take 8 mg by mouth at bedtime.      . fluticasone (FLONASE) 50 MCG/ACT nasal spray Place 2 sprays into both nostrils daily.   11  . furosemide (LASIX) 80 MG tablet Take 80 mg by mouth 2 (two) times daily.     . Homeopathic Products [facility] ALLERGY EYE RELIEF) SOLN  Place 1-2 drops into both eyes daily as needed (for dry eyes).    . insulin NPH-regular Human (NOVOLIN 70/30) (70-30) 100 UNIT/ML injection Inject 16-20 Units into the skin See admin instructions. Inject 20 units SQ in the morning and inject 16 units SQ at bedtime    . levothyroxine (SYNTHROID, LEVOTHROID) 75 MCG tablet Take 75 mcg by mouth daily before breakfast.    . lisinopril (PRINIVIL,ZESTRIL) 40 MG tablet Take 40 mg by mouth at bedtime.     . montelukast (SINGULAIR) 10 MG tablet Take 10 mg by mouth daily.     . Multiple Vitamin (MULTIVITAMIN WITH MINERALS) TABS tablet Take 1 tablet by mouth daily.    Marland Kitchen oxyCODONE-acetaminophen (PERCOCET/ROXICET) 5-325 MG tablet Take 1 tablet by mouth every 6 (six) hours as needed. (Patient not taking: Reported on 08/12/2018) 12 tablet 0  . simvastatin (ZOCOR) 40 MG tablet Take 40 mg by mouth at bedtime.      Marland Kitchen testosterone cypionate (DEPOTESTOSTERONE CYPIONATE) 200 MG/ML injection Inject 200 mg into the muscle every 21 ( twenty-one) days.     No current facility-administered medications for this visit.      ROS:  See HPI  Physical Exam:  *** Incision:  *** Extremities:  *** Neuro: *** Abdomen:  ***  Assessment/Plan:  This is a 77 y.o. male who is s/p: left RC AVF on 05/06/18 by Dr. Donnetta Hutching and subsequently underwent a translocation of his fistula on 07/11/18 also by Dr. Donnetta Hutching who presents today for repeat duplex of fistula.  -***   Leontine Locket, PA-C [facility] [phone]  Clinic MD:  Trula Slade

## 2018-09-09 ENCOUNTER — Ambulatory Visit: Payer: Medicare HMO

## 2018-09-09 ENCOUNTER — Ambulatory Visit (HOSPITAL_COMMUNITY): Payer: Medicare HMO | Attending: Vascular Surgery

## 2018-09-12 ENCOUNTER — Ambulatory Visit (HOSPITAL_COMMUNITY)
Admission: RE | Admit: 2018-09-12 | Discharge: 2018-09-12 | Disposition: A | Discharge status: Home / Self Care | Payer: Medicare HMO | Source: Ambulatory Visit | Attending: Nephrology | Admitting: Nephrology

## 2018-09-12 VITALS — BP 138/74 | HR 53 | Temp 97.7°F | Resp 20

## 2018-09-12 DIAGNOSIS — N189 Chronic kidney disease, unspecified: Secondary | ICD-10-CM | POA: Insufficient documentation

## 2018-09-12 DIAGNOSIS — E291 Testicular hypofunction: Secondary | ICD-10-CM | POA: Diagnosis not present

## 2018-09-12 DIAGNOSIS — Z862 Personal history of diseases of the blood and blood-forming organs and certain disorders involving the immune mechanism: Secondary | ICD-10-CM | POA: Diagnosis not present

## 2018-09-12 LAB — POCT HEMOGLOBIN-HEMACUE: HEMOGLOBIN: 12.3 g/dL — AB (ref 13.0–17.0)

## 2018-09-12 MED ORDER — EPOETIN ALFA-EPBX 40000 UNIT/ML IJ SOLN: 30000.0000 [IU] | INTRAMUSCULAR | Status: DC

## 2018-09-16 ENCOUNTER — Encounter: Payer: Self-pay | Admitting: Pulmonary Disease

## 2018-09-16 ENCOUNTER — Ambulatory Visit: Payer: Medicare HMO | Admitting: Pulmonary Disease

## 2018-09-16 VITALS — BP 144/64 | HR 56

## 2018-09-16 DIAGNOSIS — I10 Essential (primary) hypertension: Secondary | ICD-10-CM | POA: Diagnosis not present

## 2018-09-16 DIAGNOSIS — G4733 Obstructive sleep apnea (adult) (pediatric): Secondary | ICD-10-CM | POA: Diagnosis not present

## 2018-09-16 NOTE — Progress Notes (Signed)
   Subjective:    Patient ID: David Nguyen, male    DOB: 01/21/1941, 77 y.o.   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   He is using a nasal mask and does not like this very well, requests a full facemask. No problems with pressure.  He obtained a new machine 2 years ago and this is working well, this is quieter.  He wakes up feeling refreshed, denies daytime naps. Remains active and still works in the yard   Significant tests/ events reviewed  PSG 04/2008 >> severe obstructive sleep apnea with hypopneas , AHI 63/h, lowest desatn of 78%, longest event 52s. nocturnal cough noted. Improved with CPAP 15 cm    Past Medical History:  Diagnosis Date  . Allergic rhinitis   . Anemia    low iron  . Chronic kidney disease    Followed by Dr Deterding    . Diabetes mellitus    Type II  . Hyperlipidemia   . Hypertension   . Hypothyroidism   . Sleep apnea    uses a cpap    Review of Systems Patient denies significant dyspnea,cough, hemoptysis,  chest pain, palpitations, pedal edema, orthopnea, paroxysmal nocturnal dyspnea, lightheadedness, nausea, vomiting, abdominal or  leg pains      Objective:   Physical Exam   Gen. Pleasant, obese, in no distress ENT - no lesions, no post nasal drip Neck: No JVD, no thyromegaly, no carotid bruits Lungs: no use of accessory muscles, no dullness to percussion, decreased without rales or rhonchi  Cardiovascular: Rhythm regular, heart sounds  normal, no murmurs or gallops, no peripheral edema Musculoskeletal: No deformities, no cyanosis or clubbing , no tremors        Assessment & Plan:

## 2018-09-16 NOTE — Patient Instructions (Signed)
Prescription for new air fit F 30 fullface mask

## 2018-09-17 DIAGNOSIS — G4733 Obstructive sleep apnea (adult) (pediatric): Secondary | ICD-10-CM | POA: Diagnosis not present

## 2018-09-17 NOTE — Assessment & Plan Note (Signed)
OSA is well controlled symptomatically on current settings of CPAP. He is compliant, we will check a download report and ensure that settings are appropriate.  Weight loss encouraged, compliance with goal of at least 4-6 hrs every night is the expectation. Advised against medications with sedative side effects Cautioned against driving when sleepy - understanding that sleepiness will vary on a day to day basis

## 2018-09-17 NOTE — Assessment & Plan Note (Signed)
Blood pressure appears well controlled on 3 medications.  He is mildly bradycardic due to Coreg

## 2018-09-19 ENCOUNTER — Encounter (HOSPITAL_COMMUNITY): Payer: Medicare HMO

## 2018-09-26 ENCOUNTER — Ambulatory Visit (HOSPITAL_COMMUNITY)
Admission: RE | Admit: 2018-09-26 | Discharge: 2018-09-26 | Disposition: A | Discharge status: Home / Self Care | Payer: Medicare HMO | Source: Ambulatory Visit | Attending: Nephrology | Admitting: Nephrology

## 2018-09-26 VITALS — BP 154/65 | HR 68 | Temp 97.7°F | Resp 20

## 2018-09-26 DIAGNOSIS — N189 Chronic kidney disease, unspecified: Secondary | ICD-10-CM | POA: Diagnosis not present

## 2018-09-26 DIAGNOSIS — Z862 Personal history of diseases of the blood and blood-forming organs and certain disorders involving the immune mechanism: Secondary | ICD-10-CM | POA: Diagnosis not present

## 2018-09-26 LAB — IRON AND TIBC
IRON: 72 ug/dL (ref 45–182)
SATURATION RATIOS: 30 % (ref 17.9–39.5)
TIBC: 242 ug/dL — AB (ref 250–450)
UIBC: 170 ug/dL

## 2018-09-26 LAB — POCT HEMOGLOBIN-HEMACUE: HEMOGLOBIN: 11.6 g/dL — AB (ref 13.0–17.0)

## 2018-09-26 LAB — FERRITIN: Ferritin: 349 ng/mL — ABNORMAL HIGH (ref 24–336)

## 2018-09-26 MED ORDER — EPOETIN ALFA-EPBX 40000 UNIT/ML IJ SOLN: 30000.0000 [IU] | INTRAMUSCULAR | Status: DC

## 2018-09-26 MED ORDER — EPOETIN ALFA-EPBX 10000 UNIT/ML IJ SOLN
30000.0000 [IU] | Freq: Once | INTRAMUSCULAR | Status: AC
  Administered 2018-09-26: 30000 [IU] via SUBCUTANEOUS
  Filled 2018-09-26: qty 3

## 2018-10-03 ENCOUNTER — Ambulatory Visit (HOSPITAL_COMMUNITY)
Admission: RE | Admit: 2018-10-03 | Discharge: 2018-10-03 | Disposition: A | Discharge status: Home / Self Care | Payer: Medicare HMO | Source: Ambulatory Visit | Attending: Vascular Surgery | Admitting: Vascular Surgery

## 2018-10-03 ENCOUNTER — Ambulatory Visit (INDEPENDENT_AMBULATORY_CARE_PROVIDER_SITE_OTHER): Payer: Self-pay | Admitting: Physician Assistant

## 2018-10-03 ENCOUNTER — Encounter (HOSPITAL_COMMUNITY): Payer: Medicare HMO

## 2018-10-03 ENCOUNTER — Other Ambulatory Visit: Payer: Self-pay

## 2018-10-03 VITALS — BP 170/68 | HR 64 | Resp 18 | Ht 72.0 in | Wt 269.0 lb

## 2018-10-03 DIAGNOSIS — N185 Chronic kidney disease, stage 5: Secondary | ICD-10-CM | POA: Diagnosis not present

## 2018-10-03 DIAGNOSIS — N184 Chronic kidney disease, stage 4 (severe): Secondary | ICD-10-CM

## 2018-10-03 DIAGNOSIS — E291 Testicular hypofunction: Secondary | ICD-10-CM | POA: Diagnosis not present

## 2018-10-03 DIAGNOSIS — E1122 Type 2 diabetes mellitus with diabetic chronic kidney disease: Secondary | ICD-10-CM

## 2018-10-03 NOTE — Progress Notes (Signed)
    Postoperative Access Visit   History of Present Illness   David Nguyen is a 77 y.o. year old male who presents for postoperative follow-up for: left radiocephalic arteriovenous fistula with subsequent translocation by Dr. Donnetta Hutching 07/11/18.  The patient's wounds are healed.  The patient denies steal symptoms.  The patient is able to complete their activities of daily living.  CKD stage IV is managed by Dr. Jimmy Footman.  Patient has a follow up appt after the first of the new year.  He is not yet on dialysis.   Physical Examination   Vitals:   10/03/18 1526  BP: (!) 170/68  Pulse: 64  Resp: 18  SpO2: 95%  Weight: 269 lb (122 kg)  Height: 6' (1.829 m)   Body mass index is 36.48 kg/m.  left arm Incision is healed, hand grip is 5/5, sensation in digits is intact, easily palpable thrill near anastomosis, this thrill is still palpable but weakens as you approach the elbow     Medical Decision Making   David Nguyen is a 77 y.o. year old male who presents s/p left radiocephalic arteriovenous fistula with subsequent translocation   Fistula duplex improved from 1 month ago; diameter at least 36mm throughout forearm  Duplex also demonstrates increased velocity near anastomosis however thrill is palpable beyond this area  Follow up with Dr. Freddrick March in a few weeks  Patient may require fistulogram prior to initiation on HD;  if there is an urgent need for HD, fistula is ready for use   [name] PA-C [facility] [place] Office: [phone]

## 2018-10-17 ENCOUNTER — Ambulatory Visit (HOSPITAL_COMMUNITY)
Admission: RE | Admit: 2018-10-17 | Discharge: 2018-10-17 | Disposition: A | Discharge status: Home / Self Care | Payer: Medicare HMO | Source: Ambulatory Visit | Attending: Nephrology | Admitting: Nephrology

## 2018-10-17 VITALS — BP 120/53 | HR 60 | Temp 97.8°F | Resp 20

## 2018-10-17 DIAGNOSIS — Z862 Personal history of diseases of the blood and blood-forming organs and certain disorders involving the immune mechanism: Secondary | ICD-10-CM | POA: Insufficient documentation

## 2018-10-17 DIAGNOSIS — N189 Chronic kidney disease, unspecified: Secondary | ICD-10-CM | POA: Diagnosis not present

## 2018-10-17 LAB — POCT HEMOGLOBIN-HEMACUE: Hemoglobin: 12.4 g/dL — ABNORMAL LOW (ref 13.0–17.0)

## 2018-10-17 MED ORDER — EPOETIN ALFA-EPBX 40000 UNIT/ML IJ SOLN
30000.0000 [IU] | INTRAMUSCULAR | Status: DC
  Filled 2018-10-17: qty 1

## 2018-10-24 DIAGNOSIS — N184 Chronic kidney disease, stage 4 (severe): Secondary | ICD-10-CM | POA: Diagnosis not present

## 2018-10-24 DIAGNOSIS — N2581 Secondary hyperparathyroidism of renal origin: Secondary | ICD-10-CM | POA: Diagnosis not present

## 2018-10-24 DIAGNOSIS — E1122 Type 2 diabetes mellitus with diabetic chronic kidney disease: Secondary | ICD-10-CM | POA: Diagnosis not present

## 2018-10-24 DIAGNOSIS — I129 Hypertensive chronic kidney disease with stage 1 through stage 4 chronic kidney disease, or unspecified chronic kidney disease: Secondary | ICD-10-CM | POA: Diagnosis not present

## 2018-10-24 DIAGNOSIS — D631 Anemia in chronic kidney disease: Secondary | ICD-10-CM | POA: Diagnosis not present

## 2018-10-25 DIAGNOSIS — E1129 Type 2 diabetes mellitus with other diabetic kidney complication: Secondary | ICD-10-CM | POA: Diagnosis not present

## 2018-10-25 DIAGNOSIS — Z6838 Body mass index (BMI) 38.0-38.9, adult: Secondary | ICD-10-CM | POA: Diagnosis not present

## 2018-10-25 DIAGNOSIS — D631 Anemia in chronic kidney disease: Secondary | ICD-10-CM | POA: Diagnosis not present

## 2018-10-25 DIAGNOSIS — E291 Testicular hypofunction: Secondary | ICD-10-CM | POA: Diagnosis not present

## 2018-10-25 DIAGNOSIS — E038 Other specified hypothyroidism: Secondary | ICD-10-CM | POA: Diagnosis not present

## 2018-10-25 DIAGNOSIS — I10 Essential (primary) hypertension: Secondary | ICD-10-CM | POA: Diagnosis not present

## 2018-10-25 DIAGNOSIS — N184 Chronic kidney disease, stage 4 (severe): Secondary | ICD-10-CM | POA: Diagnosis not present

## 2018-10-31 ENCOUNTER — Ambulatory Visit (HOSPITAL_COMMUNITY)
Admission: RE | Admit: 2018-10-31 | Discharge: 2018-10-31 | Disposition: A | Discharge status: Home / Self Care | Payer: Medicare HMO | Source: Ambulatory Visit | Attending: Nephrology | Admitting: Nephrology

## 2018-10-31 VITALS — BP 131/50 | HR 64 | Temp 98.1°F | Resp 20

## 2018-10-31 DIAGNOSIS — Z862 Personal history of diseases of the blood and blood-forming organs and certain disorders involving the immune mechanism: Secondary | ICD-10-CM | POA: Diagnosis not present

## 2018-10-31 DIAGNOSIS — N189 Chronic kidney disease, unspecified: Secondary | ICD-10-CM | POA: Insufficient documentation

## 2018-10-31 LAB — IRON AND TIBC
Iron: 97 ug/dL (ref 45–182)
Saturation Ratios: 41 % — ABNORMAL HIGH (ref 17.9–39.5)
TIBC: 234 ug/dL — ABNORMAL LOW (ref 250–450)
UIBC: 137 ug/dL

## 2018-10-31 LAB — FERRITIN: Ferritin: 418 ng/mL — ABNORMAL HIGH (ref 24–336)

## 2018-10-31 LAB — POCT HEMOGLOBIN-HEMACUE: Hemoglobin: 11.8 g/dL — ABNORMAL LOW (ref 13.0–17.0)

## 2018-10-31 MED ORDER — EPOETIN ALFA-EPBX 10000 UNIT/ML IJ SOLN
30000.0000 [IU] | Freq: Once | INTRAMUSCULAR | Status: AC
  Administered 2018-10-31: 30000 [IU] via SUBCUTANEOUS
  Filled 2018-10-31: qty 3

## 2018-10-31 MED ORDER — EPOETIN ALFA-EPBX 40000 UNIT/ML IJ SOLN: 30000.0000 [IU] | INTRAMUSCULAR | Status: DC

## 2018-11-07 ENCOUNTER — Encounter (HOSPITAL_COMMUNITY): Payer: Medicare HMO

## 2018-11-20 ENCOUNTER — Encounter (HOSPITAL_COMMUNITY): Payer: Medicare HMO

## 2018-11-21 ENCOUNTER — Ambulatory Visit (HOSPITAL_COMMUNITY)
Admission: RE | Admit: 2018-11-21 | Discharge: 2018-11-21 | Disposition: A | Discharge status: Home / Self Care | Payer: Medicare HMO | Source: Ambulatory Visit | Attending: Nephrology | Admitting: Nephrology

## 2018-11-21 ENCOUNTER — Encounter (HOSPITAL_COMMUNITY): Payer: Medicare HMO

## 2018-11-21 VITALS — BP 149/64 | HR 55 | Temp 97.8°F | Resp 20

## 2018-11-21 DIAGNOSIS — Z862 Personal history of diseases of the blood and blood-forming organs and certain disorders involving the immune mechanism: Secondary | ICD-10-CM | POA: Diagnosis not present

## 2018-11-21 DIAGNOSIS — E291 Testicular hypofunction: Secondary | ICD-10-CM | POA: Diagnosis not present

## 2018-11-21 DIAGNOSIS — N189 Chronic kidney disease, unspecified: Secondary | ICD-10-CM | POA: Diagnosis not present

## 2018-11-21 LAB — POCT HEMOGLOBIN-HEMACUE: Hemoglobin: 12.4 g/dL — ABNORMAL LOW (ref 13.0–17.0)

## 2018-11-21 MED ORDER — EPOETIN ALFA-EPBX 40000 UNIT/ML IJ SOLN: 30000.0000 [IU] | INTRAMUSCULAR | Status: DC

## 2018-11-21 MED ORDER — EPOETIN ALFA-EPBX 10000 UNIT/ML IJ SOLN
30000.0000 [IU] | Freq: Once | INTRAMUSCULAR | Status: DC
  Filled 2018-11-21: qty 3

## 2018-12-05 ENCOUNTER — Ambulatory Visit (HOSPITAL_COMMUNITY)
Admission: RE | Admit: 2018-12-05 | Discharge: 2018-12-05 | Disposition: A | Discharge status: Home / Self Care | Payer: Medicare HMO | Source: Ambulatory Visit | Attending: Nephrology | Admitting: Nephrology

## 2018-12-05 VITALS — BP 136/57 | HR 58 | Temp 97.8°F | Resp 20

## 2018-12-05 DIAGNOSIS — Z862 Personal history of diseases of the blood and blood-forming organs and certain disorders involving the immune mechanism: Secondary | ICD-10-CM | POA: Diagnosis not present

## 2018-12-05 DIAGNOSIS — N189 Chronic kidney disease, unspecified: Secondary | ICD-10-CM | POA: Diagnosis not present

## 2018-12-05 LAB — IRON AND TIBC
Iron: 74 ug/dL (ref 45–182)
Saturation Ratios: 29 % (ref 17.9–39.5)
TIBC: 256 ug/dL (ref 250–450)
UIBC: 182 ug/dL

## 2018-12-05 LAB — FERRITIN: Ferritin: 324 ng/mL (ref 24–336)

## 2018-12-05 LAB — POCT HEMOGLOBIN-HEMACUE: Hemoglobin: 12.2 g/dL — ABNORMAL LOW (ref 13.0–17.0)

## 2018-12-05 MED ORDER — EPOETIN ALFA-EPBX 10000 UNIT/ML IJ SOLN
30000.0000 [IU] | Freq: Once | INTRAMUSCULAR | Status: DC
  Filled 2018-12-05: qty 3

## 2018-12-05 MED ORDER — EPOETIN ALFA-EPBX 40000 UNIT/ML IJ SOLN: 30000.0000 [IU] | INTRAMUSCULAR | Status: DC

## 2018-12-10 DIAGNOSIS — J111 Influenza due to unidentified influenza virus with other respiratory manifestations: Secondary | ICD-10-CM | POA: Diagnosis not present

## 2018-12-10 DIAGNOSIS — R05 Cough: Secondary | ICD-10-CM | POA: Diagnosis not present

## 2018-12-10 DIAGNOSIS — J189 Pneumonia, unspecified organism: Secondary | ICD-10-CM | POA: Diagnosis not present

## 2018-12-10 DIAGNOSIS — Z6838 Body mass index (BMI) 38.0-38.9, adult: Secondary | ICD-10-CM | POA: Diagnosis not present

## 2018-12-10 DIAGNOSIS — R197 Diarrhea, unspecified: Secondary | ICD-10-CM | POA: Diagnosis not present

## 2018-12-12 ENCOUNTER — Encounter (HOSPITAL_COMMUNITY): Payer: Medicare HMO

## 2018-12-19 ENCOUNTER — Ambulatory Visit (HOSPITAL_COMMUNITY)
Admission: RE | Admit: 2018-12-19 | Discharge: 2018-12-19 | Disposition: A | Discharge status: Home / Self Care | Payer: Medicare HMO | Source: Ambulatory Visit | Attending: Nephrology | Admitting: Nephrology

## 2018-12-19 ENCOUNTER — Other Ambulatory Visit: Payer: Self-pay

## 2018-12-19 VITALS — BP 127/60 | HR 55 | Temp 97.7°F | Resp 20

## 2018-12-19 DIAGNOSIS — Z862 Personal history of diseases of the blood and blood-forming organs and certain disorders involving the immune mechanism: Secondary | ICD-10-CM | POA: Insufficient documentation

## 2018-12-19 DIAGNOSIS — E291 Testicular hypofunction: Secondary | ICD-10-CM | POA: Diagnosis not present

## 2018-12-19 DIAGNOSIS — N189 Chronic kidney disease, unspecified: Secondary | ICD-10-CM | POA: Diagnosis not present

## 2018-12-19 LAB — IRON AND TIBC
IRON: 72 ug/dL (ref 45–182)
Saturation Ratios: 30 % (ref 17.9–39.5)
TIBC: 242 ug/dL — ABNORMAL LOW (ref 250–450)
UIBC: 170 ug/dL

## 2018-12-19 LAB — FERRITIN: Ferritin: 341 ng/mL — ABNORMAL HIGH (ref 24–336)

## 2018-12-19 LAB — POCT HEMOGLOBIN-HEMACUE: Hemoglobin: 11.5 g/dL — ABNORMAL LOW (ref 13.0–17.0)

## 2018-12-19 MED ORDER — EPOETIN ALFA-EPBX 10000 UNIT/ML IJ SOLN
30000.0000 [IU] | Freq: Once | INTRAMUSCULAR | Status: DC
  Filled 2018-12-19: qty 3

## 2018-12-19 MED ORDER — EPOETIN ALFA-EPBX 40000 UNIT/ML IJ SOLN
30000.0000 [IU] | INTRAMUSCULAR | Status: DC
  Administered 2018-12-19: 30000 [IU] via SUBCUTANEOUS

## 2018-12-30 DIAGNOSIS — G4733 Obstructive sleep apnea (adult) (pediatric): Secondary | ICD-10-CM | POA: Diagnosis not present

## 2019-01-07 DIAGNOSIS — R05 Cough: Secondary | ICD-10-CM | POA: Diagnosis not present

## 2019-01-07 DIAGNOSIS — J189 Pneumonia, unspecified organism: Secondary | ICD-10-CM | POA: Diagnosis not present

## 2019-01-07 DIAGNOSIS — R197 Diarrhea, unspecified: Secondary | ICD-10-CM | POA: Diagnosis not present

## 2019-01-08 ENCOUNTER — Other Ambulatory Visit: Payer: Self-pay

## 2019-01-09 ENCOUNTER — Ambulatory Visit (HOSPITAL_COMMUNITY)
Admission: RE | Admit: 2019-01-09 | Discharge: 2019-01-09 | Disposition: A | Discharge status: Home / Self Care | Payer: Medicare HMO | Source: Ambulatory Visit | Attending: Nephrology | Admitting: Nephrology

## 2019-01-09 VITALS — BP 143/59 | HR 52 | Temp 97.9°F | Resp 20

## 2019-01-09 DIAGNOSIS — Z862 Personal history of diseases of the blood and blood-forming organs and certain disorders involving the immune mechanism: Secondary | ICD-10-CM

## 2019-01-09 DIAGNOSIS — N189 Chronic kidney disease, unspecified: Secondary | ICD-10-CM | POA: Diagnosis not present

## 2019-01-09 LAB — FERRITIN: Ferritin: 273 ng/mL (ref 24–336)

## 2019-01-09 LAB — POCT HEMOGLOBIN-HEMACUE: Hemoglobin: 12.3 g/dL — ABNORMAL LOW (ref 13.0–17.0)

## 2019-01-09 LAB — IRON AND TIBC
Iron: 62 ug/dL (ref 45–182)
Saturation Ratios: 24 % (ref 17.9–39.5)
TIBC: 253 ug/dL (ref 250–450)
UIBC: 191 ug/dL

## 2019-01-09 MED ORDER — EPOETIN ALFA-EPBX 10000 UNIT/ML IJ SOLN
30000.0000 [IU] | INTRAMUSCULAR | Status: DC
  Filled 2019-01-09: qty 3

## 2019-01-09 MED ORDER — EPOETIN ALFA-EPBX 40000 UNIT/ML IJ SOLN
30000.0000 [IU] | INTRAMUSCULAR | Status: DC
  Filled 2019-01-09: qty 1

## 2019-01-21 DIAGNOSIS — I129 Hypertensive chronic kidney disease with stage 1 through stage 4 chronic kidney disease, or unspecified chronic kidney disease: Secondary | ICD-10-CM | POA: Diagnosis not present

## 2019-01-21 DIAGNOSIS — N400 Enlarged prostate without lower urinary tract symptoms: Secondary | ICD-10-CM | POA: Diagnosis not present

## 2019-01-21 DIAGNOSIS — E1122 Type 2 diabetes mellitus with diabetic chronic kidney disease: Secondary | ICD-10-CM | POA: Diagnosis not present

## 2019-01-21 DIAGNOSIS — R809 Proteinuria, unspecified: Secondary | ICD-10-CM | POA: Diagnosis not present

## 2019-01-21 DIAGNOSIS — N184 Chronic kidney disease, stage 4 (severe): Secondary | ICD-10-CM | POA: Diagnosis not present

## 2019-01-21 DIAGNOSIS — N2581 Secondary hyperparathyroidism of renal origin: Secondary | ICD-10-CM | POA: Diagnosis not present

## 2019-01-21 DIAGNOSIS — M109 Gout, unspecified: Secondary | ICD-10-CM | POA: Diagnosis not present

## 2019-01-21 DIAGNOSIS — D631 Anemia in chronic kidney disease: Secondary | ICD-10-CM | POA: Diagnosis not present

## 2019-01-22 ENCOUNTER — Other Ambulatory Visit: Payer: Self-pay

## 2019-01-22 ENCOUNTER — Other Ambulatory Visit (HOSPITAL_COMMUNITY): Payer: Self-pay | Admitting: *Deleted

## 2019-01-23 ENCOUNTER — Encounter (HOSPITAL_COMMUNITY)
Admission: RE | Admit: 2019-01-23 | Discharge: 2019-01-23 | Disposition: A | Discharge status: Home / Self Care | Payer: Medicare HMO | Source: Ambulatory Visit | Attending: Nephrology | Admitting: Nephrology

## 2019-01-23 VITALS — BP 104/43 | HR 35 | Temp 98.6°F | Resp 20

## 2019-01-23 DIAGNOSIS — Z862 Personal history of diseases of the blood and blood-forming organs and certain disorders involving the immune mechanism: Secondary | ICD-10-CM | POA: Insufficient documentation

## 2019-01-23 DIAGNOSIS — N189 Chronic kidney disease, unspecified: Secondary | ICD-10-CM | POA: Diagnosis not present

## 2019-01-23 LAB — POCT HEMOGLOBIN-HEMACUE: Hemoglobin: 11.4 g/dL — ABNORMAL LOW (ref 13.0–17.0)

## 2019-01-23 MED ORDER — SODIUM CHLORIDE 0.9 % IV SOLN
510.0000 mg | Freq: Once | INTRAVENOUS | Status: AC
  Administered 2019-01-23: 510 mg via INTRAVENOUS
  Filled 2019-01-23: qty 510

## 2019-01-23 MED ORDER — EPOETIN ALFA-EPBX 40000 UNIT/ML IJ SOLN
30000.0000 [IU] | INTRAMUSCULAR | Status: DC
  Administered 2019-01-23: 09:00:00 30000 [IU] via SUBCUTANEOUS
  Filled 2019-01-23: qty 1

## 2019-01-30 ENCOUNTER — Encounter (HOSPITAL_COMMUNITY): Payer: Medicare HMO

## 2019-02-13 ENCOUNTER — Encounter (HOSPITAL_COMMUNITY)
Admission: RE | Admit: 2019-02-13 | Discharge: 2019-02-13 | Disposition: A | Discharge status: Home / Self Care | Payer: Medicare HMO | Source: Ambulatory Visit | Attending: Nephrology | Admitting: Nephrology

## 2019-02-13 ENCOUNTER — Other Ambulatory Visit: Payer: Self-pay

## 2019-02-13 VITALS — BP 133/66 | HR 62 | Temp 97.5°F | Resp 20

## 2019-02-13 DIAGNOSIS — Z862 Personal history of diseases of the blood and blood-forming organs and certain disorders involving the immune mechanism: Secondary | ICD-10-CM | POA: Insufficient documentation

## 2019-02-13 DIAGNOSIS — N189 Chronic kidney disease, unspecified: Secondary | ICD-10-CM | POA: Insufficient documentation

## 2019-02-13 LAB — IRON AND TIBC
Iron: 94 ug/dL (ref 45–182)
Saturation Ratios: 39 % (ref 17.9–39.5)
TIBC: 241 ug/dL — ABNORMAL LOW (ref 250–450)
UIBC: 147 ug/dL

## 2019-02-13 LAB — POCT HEMOGLOBIN-HEMACUE: Hemoglobin: 11.7 g/dL — ABNORMAL LOW (ref 13.0–17.0)

## 2019-02-13 LAB — FERRITIN: Ferritin: 423 ng/mL — ABNORMAL HIGH (ref 24–336)

## 2019-02-13 MED ORDER — EPOETIN ALFA-EPBX 40000 UNIT/ML IJ SOLN: 30000.0000 [IU] | INTRAMUSCULAR | Status: DC

## 2019-02-13 MED ORDER — EPOETIN ALFA-EPBX 10000 UNIT/ML IJ SOLN
30000.0000 [IU] | Freq: Once | INTRAMUSCULAR | Status: AC
  Administered 2019-02-13: 30000 [IU] via SUBCUTANEOUS
  Filled 2019-02-13: qty 3

## 2019-02-25 DIAGNOSIS — J189 Pneumonia, unspecified organism: Secondary | ICD-10-CM | POA: Diagnosis not present

## 2019-02-25 DIAGNOSIS — N184 Chronic kidney disease, stage 4 (severe): Secondary | ICD-10-CM | POA: Diagnosis not present

## 2019-02-25 DIAGNOSIS — E291 Testicular hypofunction: Secondary | ICD-10-CM | POA: Diagnosis not present

## 2019-02-25 DIAGNOSIS — E1129 Type 2 diabetes mellitus with other diabetic kidney complication: Secondary | ICD-10-CM | POA: Diagnosis not present

## 2019-02-25 DIAGNOSIS — Z1331 Encounter for screening for depression: Secondary | ICD-10-CM | POA: Diagnosis not present

## 2019-02-25 DIAGNOSIS — I129 Hypertensive chronic kidney disease with stage 1 through stage 4 chronic kidney disease, or unspecified chronic kidney disease: Secondary | ICD-10-CM | POA: Diagnosis not present

## 2019-02-25 DIAGNOSIS — D631 Anemia in chronic kidney disease: Secondary | ICD-10-CM | POA: Diagnosis not present

## 2019-02-27 DIAGNOSIS — E1129 Type 2 diabetes mellitus with other diabetic kidney complication: Secondary | ICD-10-CM | POA: Diagnosis not present

## 2019-03-06 ENCOUNTER — Other Ambulatory Visit: Payer: Self-pay

## 2019-03-06 ENCOUNTER — Ambulatory Visit (HOSPITAL_COMMUNITY)
Admission: RE | Admit: 2019-03-06 | Discharge: 2019-03-06 | Disposition: A | Discharge status: Home / Self Care | Payer: Medicare HMO | Source: Ambulatory Visit | Attending: Nephrology | Admitting: Nephrology

## 2019-03-06 VITALS — BP 126/59 | HR 62 | Temp 97.3°F | Resp 20

## 2019-03-06 DIAGNOSIS — Z862 Personal history of diseases of the blood and blood-forming organs and certain disorders involving the immune mechanism: Secondary | ICD-10-CM | POA: Insufficient documentation

## 2019-03-06 DIAGNOSIS — N189 Chronic kidney disease, unspecified: Secondary | ICD-10-CM | POA: Diagnosis not present

## 2019-03-06 LAB — POCT HEMOGLOBIN-HEMACUE: Hemoglobin: 12.7 g/dL — ABNORMAL LOW (ref 13.0–17.0)

## 2019-03-06 MED ORDER — EPOETIN ALFA-EPBX 10000 UNIT/ML IJ SOLN
30000.0000 [IU] | INTRAMUSCULAR | Status: DC
  Filled 2019-03-06: qty 3

## 2019-03-10 DIAGNOSIS — R001 Bradycardia, unspecified: Secondary | ICD-10-CM

## 2019-03-10 HISTORY — DX: Bradycardia, unspecified: R00.1

## 2019-03-20 ENCOUNTER — Other Ambulatory Visit: Payer: Self-pay

## 2019-03-20 ENCOUNTER — Ambulatory Visit (HOSPITAL_COMMUNITY)
Admission: RE | Admit: 2019-03-20 | Discharge: 2019-03-20 | Disposition: A | Discharge status: Home / Self Care | Payer: Medicare HMO | Source: Ambulatory Visit | Attending: Nephrology | Admitting: Nephrology

## 2019-03-20 VITALS — BP 118/60 | HR 65 | Temp 97.3°F | Resp 20

## 2019-03-20 DIAGNOSIS — Z862 Personal history of diseases of the blood and blood-forming organs and certain disorders involving the immune mechanism: Secondary | ICD-10-CM | POA: Diagnosis not present

## 2019-03-20 DIAGNOSIS — N189 Chronic kidney disease, unspecified: Secondary | ICD-10-CM | POA: Insufficient documentation

## 2019-03-20 LAB — POCT I-STAT 4, (NA,K, GLUC, HGB,HCT)
Glucose, Bld: 128 mg/dL — ABNORMAL HIGH (ref 70–99)
HCT: 40 % (ref 39.0–52.0)
Hemoglobin: 13.6 g/dL (ref 13.0–17.0)
Potassium: 4.1 mmol/L (ref 3.5–5.1)
Sodium: 138 mmol/L (ref 135–145)

## 2019-03-20 LAB — IRON AND TIBC
Iron: 117 ug/dL (ref 45–182)
Saturation Ratios: 45 % — ABNORMAL HIGH (ref 17.9–39.5)
TIBC: 262 ug/dL (ref 250–450)
UIBC: 145 ug/dL

## 2019-03-20 LAB — FERRITIN: Ferritin: 521 ng/mL — ABNORMAL HIGH (ref 24–336)

## 2019-03-20 MED ORDER — EPOETIN ALFA-EPBX 10000 UNIT/ML IJ SOLN
30000.0000 [IU] | INTRAMUSCULAR | Status: DC
  Filled 2019-03-20: qty 3

## 2019-03-27 ENCOUNTER — Encounter (HOSPITAL_COMMUNITY): Payer: Medicare HMO

## 2019-03-31 ENCOUNTER — Inpatient Hospital Stay (HOSPITAL_COMMUNITY)
Admission: EM | Admit: 2019-03-31 | Discharge: 2019-04-05 | DRG: 291 | Disposition: A | Discharge status: Home / Self Care | Payer: Medicare HMO | Attending: Internal Medicine | Admitting: Internal Medicine

## 2019-03-31 ENCOUNTER — Other Ambulatory Visit: Payer: Self-pay

## 2019-03-31 ENCOUNTER — Emergency Department (HOSPITAL_COMMUNITY): Payer: Medicare HMO

## 2019-03-31 DIAGNOSIS — I132 Hypertensive heart and chronic kidney disease with heart failure and with stage 5 chronic kidney disease, or end stage renal disease: Principal | ICD-10-CM | POA: Diagnosis present

## 2019-03-31 DIAGNOSIS — N2581 Secondary hyperparathyroidism of renal origin: Secondary | ICD-10-CM | POA: Diagnosis not present

## 2019-03-31 DIAGNOSIS — N186 End stage renal disease: Secondary | ICD-10-CM | POA: Diagnosis not present

## 2019-03-31 DIAGNOSIS — Z79899 Other long term (current) drug therapy: Secondary | ICD-10-CM | POA: Diagnosis not present

## 2019-03-31 DIAGNOSIS — I129 Hypertensive chronic kidney disease with stage 1 through stage 4 chronic kidney disease, or unspecified chronic kidney disease: Secondary | ICD-10-CM | POA: Diagnosis not present

## 2019-03-31 DIAGNOSIS — N189 Chronic kidney disease, unspecified: Secondary | ICD-10-CM | POA: Diagnosis not present

## 2019-03-31 DIAGNOSIS — I361 Nonrheumatic tricuspid (valve) insufficiency: Secondary | ICD-10-CM | POA: Diagnosis not present

## 2019-03-31 DIAGNOSIS — N400 Enlarged prostate without lower urinary tract symptoms: Secondary | ICD-10-CM | POA: Diagnosis present

## 2019-03-31 DIAGNOSIS — Z862 Personal history of diseases of the blood and blood-forming organs and certain disorders involving the immune mechanism: Secondary | ICD-10-CM

## 2019-03-31 DIAGNOSIS — D631 Anemia in chronic kidney disease: Secondary | ICD-10-CM | POA: Diagnosis present

## 2019-03-31 DIAGNOSIS — R0602 Shortness of breath: Secondary | ICD-10-CM | POA: Diagnosis not present

## 2019-03-31 DIAGNOSIS — R42 Dizziness and giddiness: Secondary | ICD-10-CM | POA: Diagnosis not present

## 2019-03-31 DIAGNOSIS — J309 Allergic rhinitis, unspecified: Secondary | ICD-10-CM | POA: Diagnosis present

## 2019-03-31 DIAGNOSIS — R001 Bradycardia, unspecified: Secondary | ICD-10-CM | POA: Diagnosis present

## 2019-03-31 DIAGNOSIS — Z9989 Dependence on other enabling machines and devices: Secondary | ICD-10-CM | POA: Diagnosis not present

## 2019-03-31 DIAGNOSIS — R000 Tachycardia, unspecified: Secondary | ICD-10-CM | POA: Diagnosis present

## 2019-03-31 DIAGNOSIS — D638 Anemia in other chronic diseases classified elsewhere: Secondary | ICD-10-CM | POA: Diagnosis present

## 2019-03-31 DIAGNOSIS — E1159 Type 2 diabetes mellitus with other circulatory complications: Secondary | ICD-10-CM | POA: Diagnosis not present

## 2019-03-31 DIAGNOSIS — Z794 Long term (current) use of insulin: Secondary | ICD-10-CM | POA: Diagnosis not present

## 2019-03-31 DIAGNOSIS — I959 Hypotension, unspecified: Secondary | ICD-10-CM | POA: Diagnosis not present

## 2019-03-31 DIAGNOSIS — E872 Acidosis: Secondary | ICD-10-CM | POA: Diagnosis not present

## 2019-03-31 DIAGNOSIS — R55 Syncope and collapse: Secondary | ICD-10-CM | POA: Diagnosis not present

## 2019-03-31 DIAGNOSIS — G4730 Sleep apnea, unspecified: Secondary | ICD-10-CM | POA: Diagnosis not present

## 2019-03-31 DIAGNOSIS — N185 Chronic kidney disease, stage 5: Secondary | ICD-10-CM | POA: Diagnosis present

## 2019-03-31 DIAGNOSIS — I5032 Chronic diastolic (congestive) heart failure: Secondary | ICD-10-CM | POA: Diagnosis not present

## 2019-03-31 DIAGNOSIS — Z880 Allergy status to penicillin: Secondary | ICD-10-CM

## 2019-03-31 DIAGNOSIS — E785 Hyperlipidemia, unspecified: Secondary | ICD-10-CM | POA: Diagnosis present

## 2019-03-31 DIAGNOSIS — I459 Conduction disorder, unspecified: Secondary | ICD-10-CM | POA: Diagnosis not present

## 2019-03-31 DIAGNOSIS — N179 Acute kidney failure, unspecified: Secondary | ICD-10-CM

## 2019-03-31 DIAGNOSIS — I152 Hypertension secondary to endocrine disorders: Secondary | ICD-10-CM | POA: Diagnosis present

## 2019-03-31 DIAGNOSIS — Z79891 Long term (current) use of opiate analgesic: Secondary | ICD-10-CM

## 2019-03-31 DIAGNOSIS — E669 Obesity, unspecified: Secondary | ICD-10-CM | POA: Diagnosis present

## 2019-03-31 DIAGNOSIS — R531 Weakness: Secondary | ICD-10-CM | POA: Diagnosis not present

## 2019-03-31 DIAGNOSIS — I10 Essential (primary) hypertension: Secondary | ICD-10-CM | POA: Diagnosis not present

## 2019-03-31 DIAGNOSIS — E118 Type 2 diabetes mellitus with unspecified complications: Secondary | ICD-10-CM | POA: Diagnosis present

## 2019-03-31 DIAGNOSIS — Z20828 Contact with and (suspected) exposure to other viral communicable diseases: Secondary | ICD-10-CM | POA: Diagnosis not present

## 2019-03-31 DIAGNOSIS — T464X5A Adverse effect of angiotensin-converting-enzyme inhibitors, initial encounter: Secondary | ICD-10-CM | POA: Diagnosis present

## 2019-03-31 DIAGNOSIS — E871 Hypo-osmolality and hyponatremia: Secondary | ICD-10-CM | POA: Diagnosis present

## 2019-03-31 DIAGNOSIS — Z841 Family history of disorders of kidney and ureter: Secondary | ICD-10-CM

## 2019-03-31 DIAGNOSIS — Z7982 Long term (current) use of aspirin: Secondary | ICD-10-CM | POA: Diagnosis not present

## 2019-03-31 DIAGNOSIS — R079 Chest pain, unspecified: Secondary | ICD-10-CM | POA: Diagnosis not present

## 2019-03-31 DIAGNOSIS — Z87891 Personal history of nicotine dependence: Secondary | ICD-10-CM

## 2019-03-31 DIAGNOSIS — I499 Cardiac arrhythmia, unspecified: Secondary | ICD-10-CM | POA: Diagnosis not present

## 2019-03-31 DIAGNOSIS — Z79890 Hormone replacement therapy (postmenopausal): Secondary | ICD-10-CM | POA: Diagnosis not present

## 2019-03-31 DIAGNOSIS — E1169 Type 2 diabetes mellitus with other specified complication: Secondary | ICD-10-CM | POA: Diagnosis not present

## 2019-03-31 DIAGNOSIS — G4733 Obstructive sleep apnea (adult) (pediatric): Secondary | ICD-10-CM | POA: Diagnosis not present

## 2019-03-31 DIAGNOSIS — E1122 Type 2 diabetes mellitus with diabetic chronic kidney disease: Secondary | ICD-10-CM | POA: Diagnosis present

## 2019-03-31 DIAGNOSIS — E875 Hyperkalemia: Secondary | ICD-10-CM | POA: Diagnosis not present

## 2019-03-31 DIAGNOSIS — I272 Pulmonary hypertension, unspecified: Secondary | ICD-10-CM | POA: Diagnosis present

## 2019-03-31 DIAGNOSIS — E038 Other specified hypothyroidism: Secondary | ICD-10-CM | POA: Diagnosis not present

## 2019-03-31 DIAGNOSIS — N281 Cyst of kidney, acquired: Secondary | ICD-10-CM | POA: Diagnosis not present

## 2019-03-31 DIAGNOSIS — Q631 Lobulated, fused and horseshoe kidney: Secondary | ICD-10-CM

## 2019-03-31 DIAGNOSIS — E039 Hypothyroidism, unspecified: Secondary | ICD-10-CM | POA: Diagnosis present

## 2019-03-31 DIAGNOSIS — Z6835 Body mass index (BMI) 35.0-35.9, adult: Secondary | ICD-10-CM

## 2019-03-31 DIAGNOSIS — N184 Chronic kidney disease, stage 4 (severe): Secondary | ICD-10-CM | POA: Diagnosis not present

## 2019-03-31 DIAGNOSIS — Z1159 Encounter for screening for other viral diseases: Secondary | ICD-10-CM

## 2019-03-31 HISTORY — DX: Lobulated, fused and horseshoe kidney: Q63.1

## 2019-03-31 LAB — I-STAT TROPONIN, ED: Troponin i, poc: 0.02 ng/mL (ref 0.00–0.08)

## 2019-03-31 LAB — CBC WITH DIFFERENTIAL/PLATELET
Abs Immature Granulocytes: 0.04 10*3/uL (ref 0.00–0.07)
Basophils Absolute: 0 10*3/uL (ref 0.0–0.1)
Basophils Relative: 0 %
Eosinophils Absolute: 0.2 10*3/uL (ref 0.0–0.5)
Eosinophils Relative: 2 %
HCT: 36.5 % — ABNORMAL LOW (ref 39.0–52.0)
Hemoglobin: 11.5 g/dL — ABNORMAL LOW (ref 13.0–17.0)
Immature Granulocytes: 1 %
Lymphocytes Relative: 17 %
Lymphs Abs: 1.5 10*3/uL (ref 0.7–4.0)
MCH: 27.1 pg (ref 26.0–34.0)
MCHC: 31.5 g/dL (ref 30.0–36.0)
MCV: 85.9 fL (ref 80.0–100.0)
Monocytes Absolute: 0.9 10*3/uL (ref 0.1–1.0)
Monocytes Relative: 10 %
Neutro Abs: 6.3 10*3/uL (ref 1.7–7.7)
Neutrophils Relative %: 70 %
Platelets: 181 10*3/uL (ref 150–400)
RBC: 4.25 MIL/uL (ref 4.22–5.81)
RDW: 17.5 % — ABNORMAL HIGH (ref 11.5–15.5)
WBC: 8.9 10*3/uL (ref 4.0–10.5)
nRBC: 0 % (ref 0.0–0.2)

## 2019-03-31 LAB — HEMOGLOBIN A1C
Hgb A1c MFr Bld: 7 % — ABNORMAL HIGH (ref 4.8–5.6)
Mean Plasma Glucose: 154.2 mg/dL

## 2019-03-31 LAB — COMPREHENSIVE METABOLIC PANEL
ALT: 51 U/L — ABNORMAL HIGH (ref 0–44)
AST: 47 U/L — ABNORMAL HIGH (ref 15–41)
Albumin: 3.4 g/dL — ABNORMAL LOW (ref 3.5–5.0)
Alkaline Phosphatase: 71 U/L (ref 38–126)
Anion gap: 13 (ref 5–15)
BUN: 113 mg/dL — ABNORMAL HIGH (ref 8–23)
CO2: 18 mmol/L — ABNORMAL LOW (ref 22–32)
Calcium: 8.9 mg/dL (ref 8.9–10.3)
Chloride: 99 mmol/L (ref 98–111)
Creatinine, Ser: 4.57 mg/dL — ABNORMAL HIGH (ref 0.61–1.24)
GFR calc Af Amer: 13 mL/min — ABNORMAL LOW (ref >60)
GFR calc non Af Amer: 11 mL/min — ABNORMAL LOW (ref >60)
Glucose, Bld: 112 mg/dL — ABNORMAL HIGH (ref 70–99)
Potassium: 6.5 mmol/L (ref 3.5–5.1)
Sodium: 130 mmol/L — ABNORMAL LOW (ref 135–145)
Total Bilirubin: 1.1 mg/dL (ref 0.3–1.2)
Total Protein: 6.1 g/dL — ABNORMAL LOW (ref 6.5–8.1)

## 2019-03-31 LAB — CBG MONITORING, ED
Glucose-Capillary: 109 mg/dL — ABNORMAL HIGH (ref 70–99)
Glucose-Capillary: 119 mg/dL — ABNORMAL HIGH (ref 70–99)

## 2019-03-31 LAB — BASIC METABOLIC PANEL
Anion gap: 13 (ref 5–15)
BUN: 113 mg/dL — ABNORMAL HIGH (ref 8–23)
CO2: 17 mmol/L — ABNORMAL LOW (ref 22–32)
Calcium: 8.9 mg/dL (ref 8.9–10.3)
Chloride: 100 mmol/L (ref 98–111)
Creatinine, Ser: 4.39 mg/dL — ABNORMAL HIGH (ref 0.61–1.24)
GFR calc Af Amer: 14 mL/min — ABNORMAL LOW (ref >60)
GFR calc non Af Amer: 12 mL/min — ABNORMAL LOW (ref >60)
Glucose, Bld: 109 mg/dL — ABNORMAL HIGH (ref 70–99)
Potassium: 4.7 mmol/L (ref 3.5–5.1)
Sodium: 130 mmol/L — ABNORMAL LOW (ref 135–145)

## 2019-03-31 LAB — GLUCOSE, CAPILLARY: Glucose-Capillary: 165 mg/dL — ABNORMAL HIGH (ref 70–99)

## 2019-03-31 LAB — TSH: TSH: 3.602 u[IU]/mL (ref 0.350–4.500)

## 2019-03-31 LAB — SARS CORONAVIRUS 2 BY RT PCR (HOSPITAL ORDER, PERFORMED IN ~~LOC~~ HOSPITAL LAB): SARS Coronavirus 2: NEGATIVE

## 2019-03-31 LAB — PROCALCITONIN: Procalcitonin: 0.13 ng/mL

## 2019-03-31 MED ORDER — ALBUTEROL SULFATE (2.5 MG/3ML) 0.083% IN NEBU: 2.5000 mg | INHALATION_SOLUTION | RESPIRATORY_TRACT | Status: DC | PRN

## 2019-03-31 MED ORDER — DOXAZOSIN MESYLATE 8 MG PO TABS
8.0000 mg | ORAL_TABLET | Freq: Every day | ORAL | Status: DC
  Administered 2019-03-31 – 2019-04-04 (×5): 8 mg via ORAL
  Filled 2019-03-31 (×5): qty 1

## 2019-03-31 MED ORDER — ACETAMINOPHEN 650 MG RE SUPP: 650.0000 mg | Freq: Four times a day (QID) | RECTAL | Status: DC | PRN

## 2019-03-31 MED ORDER — SODIUM CHLORIDE 0.9% FLUSH
3.0000 mL | Freq: Two times a day (BID) | INTRAVENOUS | Status: DC
  Administered 2019-03-31 – 2019-04-05 (×9): 3 mL via INTRAVENOUS

## 2019-03-31 MED ORDER — ALLOPURINOL 100 MG PO TABS
100.0000 mg | ORAL_TABLET | Freq: Two times a day (BID) | ORAL | Status: DC
  Administered 2019-03-31 – 2019-04-05 (×10): 100 mg via ORAL
  Filled 2019-03-31 (×10): qty 1

## 2019-03-31 MED ORDER — ASPIRIN EC 81 MG PO TBEC
81.0000 mg | DELAYED_RELEASE_TABLET | Freq: Every day | ORAL | Status: DC
  Administered 2019-04-01 – 2019-04-05 (×5): 81 mg via ORAL
  Filled 2019-03-31 (×5): qty 1

## 2019-03-31 MED ORDER — ATORVASTATIN CALCIUM 40 MG PO TABS
40.0000 mg | ORAL_TABLET | Freq: Every day | ORAL | Status: DC
  Administered 2019-03-31 – 2019-04-04 (×5): 40 mg via ORAL
  Filled 2019-03-31 (×5): qty 1

## 2019-03-31 MED ORDER — ONDANSETRON HCL 4 MG/2ML IJ SOLN
4.0000 mg | Freq: Four times a day (QID) | INTRAMUSCULAR | Status: DC | PRN
  Administered 2019-04-02: 4 mg via INTRAVENOUS
  Filled 2019-03-31 (×2): qty 2

## 2019-03-31 MED ORDER — DEXTROSE 50 % IV SOLN
1.0000 | Freq: Once | INTRAVENOUS | Status: AC
  Administered 2019-03-31: 50 mL via INTRAVENOUS
  Filled 2019-03-31: qty 50

## 2019-03-31 MED ORDER — PATIROMER SORBITEX CALCIUM 8.4 G PO PACK
8.4000 g | PACK | Freq: Once | ORAL | Status: AC
  Administered 2019-03-31: 8.4 g via ORAL
  Filled 2019-03-31: qty 1

## 2019-03-31 MED ORDER — CALCITRIOL 0.25 MCG PO CAPS
0.2500 ug | ORAL_CAPSULE | Freq: Every day | ORAL | Status: DC
  Administered 2019-04-01 – 2019-04-05 (×5): 0.25 ug via ORAL
  Filled 2019-03-31 (×5): qty 1

## 2019-03-31 MED ORDER — ONDANSETRON HCL 4 MG PO TABS: 4.0000 mg | ORAL_TABLET | Freq: Four times a day (QID) | ORAL | Status: DC | PRN

## 2019-03-31 MED ORDER — INSULIN ASPART 100 UNIT/ML ~~LOC~~ SOLN
0.0000 [IU] | Freq: Three times a day (TID) | SUBCUTANEOUS | Status: DC
  Administered 2019-04-01: 3 [IU] via SUBCUTANEOUS
  Administered 2019-04-02 (×2): 2 [IU] via SUBCUTANEOUS
  Administered 2019-04-02: 3 [IU] via SUBCUTANEOUS
  Administered 2019-04-03: 5 [IU] via SUBCUTANEOUS
  Administered 2019-04-03: 2 [IU] via SUBCUTANEOUS
  Administered 2019-04-04: 3 [IU] via SUBCUTANEOUS
  Administered 2019-04-04 (×2): 2 [IU] via SUBCUTANEOUS

## 2019-03-31 MED ORDER — AMLODIPINE BESYLATE 5 MG PO TABS
10.0000 mg | ORAL_TABLET | Freq: Every day | ORAL | Status: DC
  Administered 2019-03-31 – 2019-04-04 (×5): 10 mg via ORAL
  Filled 2019-03-31 (×5): qty 4

## 2019-03-31 MED ORDER — LEVOTHYROXINE SODIUM 75 MCG PO TABS
75.0000 ug | ORAL_TABLET | Freq: Every day | ORAL | Status: DC
  Administered 2019-04-01 – 2019-04-05 (×5): 75 ug via ORAL
  Filled 2019-03-31 (×5): qty 1

## 2019-03-31 MED ORDER — SODIUM BICARBONATE 8.4 % IV SOLN
50.0000 meq | Freq: Once | INTRAVENOUS | Status: AC
  Administered 2019-03-31: 50 meq via INTRAVENOUS
  Filled 2019-03-31: qty 50

## 2019-03-31 MED ORDER — HEPARIN SODIUM (PORCINE) 5000 UNIT/ML IJ SOLN
5000.0000 [IU] | Freq: Three times a day (TID) | INTRAMUSCULAR | Status: DC
  Administered 2019-03-31 – 2019-04-05 (×14): 5000 [IU] via SUBCUTANEOUS
  Filled 2019-03-31 (×13): qty 1

## 2019-03-31 MED ORDER — INSULIN ASPART 100 UNIT/ML ~~LOC~~ SOLN: 0.0000 [IU] | Freq: Every day | SUBCUTANEOUS | Status: DC

## 2019-03-31 MED ORDER — ACETAMINOPHEN 325 MG PO TABS
650.0000 mg | ORAL_TABLET | Freq: Four times a day (QID) | ORAL | Status: DC | PRN
  Administered 2019-04-02 (×3): 650 mg via ORAL
  Filled 2019-03-31 (×3): qty 2

## 2019-03-31 MED ORDER — INSULIN ASPART 100 UNIT/ML IV SOLN
5.0000 [IU] | Freq: Once | INTRAVENOUS | Status: AC
  Administered 2019-03-31: 5 [IU] via INTRAVENOUS

## 2019-03-31 MED ORDER — FUROSEMIDE 20 MG PO TABS: 80.0000 mg | ORAL_TABLET | Freq: Every day | ORAL | Status: DC

## 2019-03-31 MED ORDER — FLUTICASONE PROPIONATE 50 MCG/ACT NA SUSP
2.0000 | Freq: Every day | NASAL | Status: DC
  Administered 2019-04-01 – 2019-04-05 (×5): 2 via NASAL
  Filled 2019-03-31: qty 16

## 2019-03-31 MED ORDER — MONTELUKAST SODIUM 10 MG PO TABS
10.0000 mg | ORAL_TABLET | Freq: Every day | ORAL | Status: DC
  Administered 2019-04-01 – 2019-04-05 (×5): 10 mg via ORAL
  Filled 2019-03-31 (×5): qty 1

## 2019-03-31 NOTE — Plan of Care (Signed)
  Problem: Activity: Goal: Risk for activity intolerance will decrease Outcome: Progressing   Problem: Coping: Goal: Level of anxiety will decrease Outcome: Progressing   Problem: Safety: Goal: Ability to remain free from injury will improve Outcome: Progressing   

## 2019-03-31 NOTE — ED Triage Notes (Signed)
Patient sent from [place] office for HR in the 40s, no history of same. Patient went to doctor for generalized weakness over the past few days. Also endorses dizziness with position changes, specifically standing. He denies pain, fevers/chills, cough, N/V/D. He had blood work done at MD office showing elevated K+ and kidney function (hx CKD, CHF). A&O x 4.   EMS VS: 150/91, RR 14, HR 44 RBBB, junctional rhythm, 91% RA - increased to 97% on 2L O2 nasal cannula. 20g. PIV RFA.

## 2019-03-31 NOTE — ED Notes (Signed)
Patient A&O x 4, however RN noticing that he asks repeated questions - for example, when hearing tube system, he asks what the noise is or if it's thundering outside.

## 2019-03-31 NOTE — ED Notes (Addendum)
RN navigator made contact with patient's wife Rollene Fare. Let her know the patient had just arrived to the hospital and labs and testing currently in progress. Will provide update when results are available.

## 2019-03-31 NOTE — ED Notes (Addendum)
ED TO INPATIENT HANDOFF REPORT  ED Nurse Name and Phone #:  Nigel Mormon [phone]  S Name/Age/Gender David Nguyen 78 y.o. male Room/Bed: RESUSC/RESUSC  Code Status   Code Status: Full Code  Home/SNF/Other Home Patient oriented to: self, place, time and situation Is this baseline? Yes   Triage Complete: Triage complete  Chief Complaint brady  Triage Note No notes on file   Allergies Allergies  Allergen Reactions  . Penicillins Other (See Comments)    UNSPECIFIED REACTION  Has patient had a PCN reaction causing immediate rash, facial/tongue/throat swelling, SOB or lightheadedness with hypotension: Unknown Has patient had a PCN reaction causing severe rash involving mucus membranes or skin necrosis: Unknown Has patient had a PCN reaction that required hospitalization: Unknown Has patient had a PCN reaction occurring within the last 10 years: No If all of the above answers are "NO", then may proceed with Cephalosporin use.     Level of Care/Admitting Diagnosis ED Disposition    ED Disposition Condition [facility] Area: [place] [[id number]]  Level of Care: Telemetry Medical [104]  I expect the patient will be discharged within 24 hours: No (not a candidate for 5C-Observation unit)  Covid Evaluation: Screening Protocol (No Symptoms)  Diagnosis: Bradycardia [742595]  Admitting Physician: Norval Morton [[id number]]  Attending Physician: Norval Morton [[id number]]  PT Class (Do Not Modify): Observation [104]  PT Acc Code (Do Not Modify): Observation [10022]       B Medical/Surgery History Past Medical History:  Diagnosis Date  . Allergic rhinitis   . Anemia    low iron  . Chronic kidney disease    Followed by Dr Deterding    . Diabetes mellitus    Type II  . Hyperlipidemia   . Hypertension   . Hypothyroidism   . Sleep apnea    uses a cpap   Past Surgical History:  Procedure Laterality Date  . ADRENALECTOMY  1993    left  . AV FISTULA PLACEMENT Left 05/06/2018   Procedure: ARTERIOVENOUS (AV) FISTULA CREATION RADIOCEPHALIC;  Surgeon: [name], MD;  Location: [place];  Service: Vascular;  Laterality: Left;  . COLONOSCOPY W/ POLYPECTOMY    . REVISION OF ARTERIOVENOUS GORETEX GRAFT Left 07/11/2018   Procedure: TRANSPOSITION OF RADIOCEPHALIC  ARTERIOVENOUS FISTULA LEFT ARM;  Surgeon: [name], MD;  Location[facility];  Service: Vascular;  Laterality: Left;     A IV Location/Drains/Wounds Patient Lines/Drains/Airways Status   Active Line/Drains/Airways    Name:   Placement date:   Placement time:   Site:   Days:   Peripheral IV 03/31/19 Right Forearm   03/31/19    1310    Forearm   less than 1   Peripheral IV 03/31/19 Right Antecubital   03/31/19    1320    Antecubital   less than 1   Fistula / Graft Left Forearm Arteriovenous fistula   05/06/18    -    Forearm   329   Incision (Closed) 05/06/18 Arm Left   05/06/18    0816     329   Incision (Closed) 07/11/18 Arm Left   07/11/18    1213     263   Incision (Closed) 07/11/18 Arm Left   07/11/18    1213     263   Incision (Closed) 07/11/18 Elbow Left   07/11/18    1213     263  Intake/Output Last 24 hours  Intake/Output Summary (Last 24 hours) at 03/31/2019 1807 Last data filed at 03/31/2019 1544 Gross per 24 hour  Intake -  Output 350 ml  Net -350 ml    Labs/Imaging Results for orders placed or performed during the hospital encounter of 03/31/19 (from the past 48 hour(s))  Comprehensive metabolic panel     Status: Abnormal   Collection Time: 03/31/19  1:30 PM  Result Value Ref Range   Sodium 130 (L) 135 - 145 mmol/L   Potassium 6.5 (HH) 3.5 - 5.1 mmol/L    Comment: HEMOLYSIS AT THIS LEVEL MAY AFFECT RESULT CRITICAL RESULT CALLED TO, READ BACK BY AND VERIFIED WITH: T.MORRIS,RN 1435 03/31/2019 CLARK,S    Chloride 99 98 - 111 mmol/L   CO2 18 (L) 22 - 32 mmol/L   Glucose, Bld 112 (H) 70 - 99 mg/dL   BUN 113 (H) 8 - 23 mg/dL    Creatinine, Ser 4.57 (H) 0.61 - 1.24 mg/dL   Calcium 8.9 8.9 - 10.3 mg/dL   Total Protein 6.1 (L) 6.5 - 8.1 g/dL   Albumin 3.4 (L) 3.5 - 5.0 g/dL   AST 47 (H) 15 - 41 U/L   ALT 51 (H) 0 - 44 U/L   Alkaline Phosphatase 71 38 - 126 U/L   Total Bilirubin 1.1 0.3 - 1.2 mg/dL   GFR calc non Af Amer 11 (L) >60 mL/min   GFR calc Af Amer 13 (L) >60 mL/min   Anion gap 13 5 - 15    Comment: Performed at [facility], [address] [place], [place] [address]  CBC with Differential     Status: Abnormal   Collection Time: 03/31/19  1:30 PM  Result Value Ref Range   WBC 8.9 4.0 - 10.5 K/uL   RBC 4.25 4.22 - 5.81 MIL/uL   Hemoglobin 11.5 (L) 13.0 - 17.0 g/dL   HCT 36.5 (L) 39.0 - 52.0 %   MCV 85.9 80.0 - 100.0 fL   MCH 27.1 26.0 - 34.0 pg   MCHC 31.5 30.0 - 36.0 g/dL   RDW 17.5 (H) 11.5 - 15.5 %   Platelets 181 150 - 400 K/uL   nRBC 0.0 0.0 - 0.2 %   Neutrophils Relative % 70 %   Neutro Abs 6.3 1.7 - 7.7 K/uL   Lymphocytes Relative 17 %   Lymphs Abs 1.5 0.7 - 4.0 K/uL   Monocytes Relative 10 %   Monocytes Absolute 0.9 0.1 - 1.0 K/uL   Eosinophils Relative 2 %   Eosinophils Absolute 0.2 0.0 - 0.5 K/uL   Basophils Relative 0 %   Basophils Absolute 0.0 0.0 - 0.1 K/uL   Immature Granulocytes 1 %   Abs Immature Granulocytes 0.04 0.00 - 0.07 K/uL    Comment: Performed at [facility] [address], [place], [place] [address]  I-Stat Troponin, ED (not at [facility])     Status: None   Collection Time: 03/31/19  1:39 PM  Result Value Ref Range   Troponin i, poc 0.02 0.00 - 0.08 ng/mL   Comment 3            Comment: Due to the release kinetics of cTnI, a negative result within the first hours of the onset of symptoms does not rule out myocardial infarction with certainty. If myocardial infarction is still suspected, repeat the test at appropriate intervals.   SARS Coronavirus 2 (CEPHEID - Performed in [place] hospital lab), [facility]  Status: None   Collection Time:  03/31/19  1:57 PM   Specimen: Nasopharyngeal Swab  Result Value Ref Range   SARS Coronavirus 2 NEGATIVE NEGATIVE    Comment: (NOTE) If result is NEGATIVE SARS-CoV-2 target nucleic acids are NOT DETECTED. The SARS-CoV-2 RNA is generally detectable in upper and lower  respiratory specimens during the acute phase of infection. The lowest  concentration of SARS-CoV-2 viral copies this assay can detect is 250  copies / mL. A negative result does not preclude SARS-CoV-2 infection  and should not be used as the sole basis for treatment or other  patient management decisions.  A negative result may occur with  improper specimen collection / handling, submission of specimen other  than nasopharyngeal swab, presence of viral mutation(s) within the  areas targeted by this assay, and inadequate number of viral copies  (<250 copies / mL). A negative result must be combined with clinical  observations, patient history, and epidemiological information. If result is POSITIVE SARS-CoV-2 target nucleic acids are DETECTED. The SARS-CoV-2 RNA is generally detectable in upper and lower  respiratory specimens dur ing the acute phase of infection.  Positive  results are indicative of active infection with SARS-CoV-2.  Clinical  correlation with patient history and other diagnostic information is  necessary to determine patient infection status.  Positive results do  not rule out bacterial infection or co-infection with other viruses. If result is PRESUMPTIVE POSTIVE SARS-CoV-2 nucleic acids MAY BE PRESENT.   A presumptive positive result was obtained on the submitted specimen  and confirmed on repeat testing.  While 2019 novel coronavirus  (SARS-CoV-2) nucleic acids may be present in the submitted sample  additional confirmatory testing may be necessary for epidemiological  and / or clinical management purposes  to differentiate between  SARS-CoV-2 and other Sarbecovirus currently known to infect humans.   If clinically indicated additional testing with an alternate test  methodology [phone]) is advised. The SARS-CoV-2 RNA is generally  detectable in upper and lower respiratory sp ecimens during the acute  phase of infection. The expected result is Negative. Fact Sheet for Patients:  StrictlyIdeas.no Fact Sheet for Healthcare Providers: BankingDealers.co.za This test is not yet approved or cleared by the [place] FDA and has been authorized for detection and/or diagnosis of SARS-CoV-2 by FDA under an Emergency Use Authorization (EUA).  This EUA will remain in effect (meaning this test can be used) for the duration of the COVID-19 declaration under Section 564(b)(1) of the Act, 21 U.S.C. section 360bbb-3(b)(1), unless the authorization is terminated or revoked sooner. Performed at [facility], [place] [address], [place], [place] [address]   CBG monitoring, ED     Status: Abnormal   Collection Time: 03/31/19  2:56 PM  Result Value Ref Range   Glucose-Capillary 109 (H) 70 - 99 mg/dL  CBG monitoring, ED     Status: Abnormal   Collection Time: 03/31/19  4:20 PM  Result Value Ref Range   Glucose-Capillary 119 (H) 70 - 99 mg/dL   Dg Chest Port 1 View  Result Date: 03/31/2019 CLINICAL DATA:  Chest pain and shortness of breath EXAM: PORTABLE CHEST 1 VIEW COMPARISON:  Portable exam 1334 hours without priors for comparison FINDINGS: Enlargement of cardiac silhouette with pulmonary vascular congestion. Atherosclerotic calcification aorta. Hazy LEFT basilar infiltrate. Remaining lungs clear. No pleural effusion or pneumothorax. Osseous structures unremarkable. IMPRESSION: Enlargement of cardiac silhouette with pulmonary vascular congestion. Hazy LEFT basilar infiltrate question pneumonia. Electronically Signed   By: Elta Guadeloupe  Thornton Papas M.D.   On: 03/31/2019 13:45    Pending Labs Unresulted Labs (From admission, onward)    Start     Ordered    04/01/19 0500  CBC  Tomorrow morning,   R     03/31/19 1719   04/01/19 9924  Basic metabolic panel  Tomorrow morning,   R     03/31/19 1719   03/31/19 1802  Hemoglobin A1c  Add-on,   AD    Comments: To assess prior glycemic control    03/31/19 1801   03/31/19 1750  TSH  Add-on,   AD     03/31/19 1749   03/31/19 2683  Basic metabolic panel  ONCE - STAT,   STAT    Comments: Repeat - new blood draw please    03/31/19 1521          Vitals/Pain Today's Vitals   03/31/19 1715 03/31/19 1730 03/31/19 1800 03/31/19 1802  BP: (!) 145/56 (!) 141/56 (!) 135/42   Pulse: (!) 44 (!) 43 (!) 45   Resp: 15 13 (!) 24   Temp:      TempSrc:      SpO2: 97% 96% 97%   Weight:      Height:      PainSc:    0-No pain    Isolation Precautions No active isolations  Medications Medications  aspirin EC tablet 81 mg (has no administration in time range)  allopurinol (ZYLOPRIM) tablet 100 mg (has no administration in time range)  atorvastatin (LIPITOR) tablet 40 mg (has no administration in time range)  furosemide (LASIX) tablet 80 mg (has no administration in time range)  doxazosin (CARDURA) tablet 8 mg (has no administration in time range)  amLODipine (NORVASC) tablet 10 mg (has no administration in time range)  calcitRIOL (ROCALTROL) capsule 0.25 mcg (has no administration in time range)  levothyroxine (SYNTHROID) tablet 75 mcg (has no administration in time range)  fluticasone (FLONASE) 50 MCG/ACT nasal spray 2 spray (has no administration in time range)  montelukast (SINGULAIR) tablet 10 mg (has no administration in time range)  heparin injection 5,000 Units (has no administration in time range)  sodium chloride flush (NS) 0.9 % injection 3 mL (has no administration in time range)  ondansetron (ZOFRAN) tablet 4 mg (has no administration in time range)    Or  ondansetron (ZOFRAN) injection 4 mg (has no administration in time range)  acetaminophen (TYLENOL) tablet 650 mg (has no administration  in time range)    Or  acetaminophen (TYLENOL) suppository 650 mg (has no administration in time range)  albuterol (PROVENTIL) (2.5 MG/3ML) 0.083% nebulizer solution 2.5 mg (has no administration in time range)  insulin aspart (novoLOG) injection 0-15 Units (has no administration in time range)  insulin aspart (novoLOG) injection 0-5 Units (has no administration in time range)  insulin aspart (novoLOG) injection 5 Units (5 Units Intravenous Given 03/31/19 1505)    And  dextrose 50 % solution 50 mL (50 mLs Intravenous Given 03/31/19 1506)  sodium bicarbonate injection 50 mEq (50 mEq Intravenous Given 03/31/19 1506)  patiromer Daryll Drown) packet 8.4 g (8.4 g Oral Given 03/31/19 1740)    Mobility walks with person assist Low fall risk   Focused Assessments Cardiac Assessment Handoff:  Cardiac Rhythm: Bundle branch block, Junctional rhythm No results found for: CKTOTAL, CKMB, CKMBINDEX, TROPONINI No results found for: DDIMER Does the Patient currently have chest pain? No      R Recommendations: See Admitting Provider Note  Report given to: Barbie, RN  [address]  Additional Notes:  Patient's L arm restricted (fistula). A&O x 4, though asks some questions repeatedly, such as whether is potassium was high or low and the potential causes, after having explained multiple times. Patient updated family on own and asked if he would like [place] to notify family and he stated he was okay. C/o dizziness with position changes, specifically standing, but states it resolved when sitting back down. Pitting edema BLE, but he reports it is normal for him. Other than generalized weakness over the past few days, he denies any complaints. On nasal cannula O2 at home per patient as needed - 91% RA with EMS - increased to 97% on 2L nasal cannula.

## 2019-03-31 NOTE — ED Provider Notes (Signed)
Dr Joelyn Oms consulted about patient's Renal failure and Hyperkalemia. Asks for 8.4 g of veltassa. Will see in consult   Sherwood Gambler, MD 03/31/19 [phone]

## 2019-03-31 NOTE — Progress Notes (Signed)
Patient refused to wear CPAP. RT will monitor as needed.

## 2019-03-31 NOTE — ED Provider Notes (Signed)
[facility] EMERGENCY DEPARTMENT Provider Note   CSN: [id number] Arrival date & time: 03/31/19  1310     History   Chief Complaint Chief Complaint  Patient presents with  . Bradycardia    HPI David Nguyen is a 78 y.o. male.     78 year old male with prior medical history as detailed below presents for evaluation of reported bradycardia.  Patient reports that he has been feeling weak and dizzy for the last several days.  He went to his primary care provider for evaluation.  At the clinic he was noted to be significantly bradycardic into the low 40s.  His EKG was suggestive of possible junctional rhythm.  These are new findings.  The patient denies associated chest pain.  He denies syncopal symptoms.  He does endorse mild weakness.  This is associated with intermittent dizziness at times.  Currently, the patient is comfortable while sitting at rest on the stretcher.  He reports full compliance with all medications.  The history is provided by the patient and medical records.  Illness Location:  Bradycardia, weakness Severity:  Mild Onset quality:  Unable to specify Timing:  Constant Progression:  Unchanged Chronicity:  New Associated symptoms: no chest pain     Past Medical History:  Diagnosis Date  . Allergic rhinitis   . Anemia    low iron  . Chronic kidney disease    Followed by Dr Deterding    . Diabetes mellitus    Type II  . Hyperlipidemia   . Hypertension   . Hypothyroidism   . Sleep apnea    uses a cpap    Patient Active Problem List   Diagnosis Date Noted  . CKD stage 4 due to type 2 diabetes mellitus ([place]) 04/15/2018  . History of anemia due to CKD 04/15/2018  . Morbid obesity due to excess calories ([place]) 07/13/2017  . DIABETES, TYPE 2 05/26/2008  . HYPERLIPIDEMIA 05/26/2008  . Essential hypertension 05/26/2008  . Allergic rhinitis 05/26/2008  . Sleep apnea 05/26/2008    Past Surgical History:  Procedure Laterality Date  .  ADRENALECTOMY  1993   left  . AV FISTULA PLACEMENT Left 05/06/2018   Procedure: ARTERIOVENOUS (AV) FISTULA CREATION RADIOCEPHALIC;  Surgeon: [name], MD;  Location: [facility];  Service: Vascular;  Laterality: Left;  . COLONOSCOPY W/ POLYPECTOMY    . REVISION OF ARTERIOVENOUS GORETEX GRAFT Left 07/11/2018   Procedure: TRANSPOSITION OF RADIOCEPHALIC  ARTERIOVENOUS FISTULA LEFT ARM;  Surgeon: [name], MD;  Location: [facility];  Service: Vascular;  Laterality: Left;        Home Medications    Prior to Admission medications   Medication Sig Start Date End Date Taking? Authorizing Provider  allopurinol (ZYLOPRIM) 100 MG tablet Take 200 mg by mouth daily.  12/07/15   [provider]  amLODipine (NORVASC) 10 MG tablet Take 10 mg by mouth every evening.     [provider]  aspirin 81 MG tablet Take 81 mg by mouth daily.      [provider]  atorvastatin (LIPITOR) 40 MG tablet Take 40 mg by mouth daily.    [provider]  calcitRIOL (ROCALTROL) 0.25 MCG capsule Take 0.25 mcg by mouth daily.  12/07/15   [provider]  carvedilol (COREG) 25 MG tablet Take 12.5 mg by mouth 2 (two) times daily with a meal.     [provider]  doxazosin (CARDURA) 8 MG tablet Take 8 mg by mouth at  bedtime.      [provider]  fluticasone (FLONASE) 50 MCG/ACT nasal spray Place 2 sprays into both nostrils daily.  03/31/18   [provider]  furosemide (LASIX) 80 MG tablet Take 80 mg by mouth 2 (two) times daily.     [provider]  Homeopathic Products [facility] ALLERGY EYE RELIEF) SOLN Place 1-2 drops into both eyes daily as needed (for dry eyes).    [provider]  insulin NPH-regular Human (NOVOLIN 70/30) (70-30) 100 UNIT/ML injection Inject 16-20 Units into the skin See admin instructions. Inject 20 units SQ in the morning and inject 16 units SQ at bedtime    [provider]  levothyroxine (SYNTHROID, LEVOTHROID)  75 MCG tablet Take 75 mcg by mouth daily before breakfast.    [provider]  lisinopril (PRINIVIL,ZESTRIL) 40 MG tablet Take 40 mg by mouth at bedtime.     [provider]  montelukast (SINGULAIR) 10 MG tablet Take 10 mg by mouth daily.  01/01/16   [provider]  Multiple Vitamin (MULTIVITAMIN WITH MINERALS) TABS tablet Take 1 tablet by mouth daily.    [provider]  oxyCODONE-acetaminophen (PERCOCET/ROXICET) 5-325 MG tablet Take 1 tablet by mouth every 6 (six) hours as needed. 07/11/18   Ulyses Amor, PA-C  simvastatin (ZOCOR) 40 MG tablet Take 40 mg by mouth at bedtime.      [provider]  testosterone cypionate (DEPOTESTOSTERONE CYPIONATE) 200 MG/ML injection Inject 200 mg into the muscle every 21 ( twenty-one) days. 04/23/18   [provider]    Family History Family History  Problem Relation Age of Onset  . Prostate cancer Father   . Kidney failure Mother   . Lung cancer Sister     Social History Social History   Tobacco Use  . Smoking status: Former Smoker    Packs/day: 0.30    Years: 3.00    Pack years: 0.90    Types: Cigarettes    Quit date: 10/09/1968    Years since quitting: 50.5  . Smokeless tobacco: Never Used  Substance Use Topics  . Alcohol use: No  . Drug use: No     Allergies   Penicillins   Review of Systems Review of Systems  Cardiovascular: Negative for chest pain.  All other systems reviewed and are negative.    Physical Exam Updated Vital Signs BP (!) 139/52 (BP Location: Left Arm)   Pulse (!) 44   Temp (!) 97.5 F (36.4 C) (Oral)   Resp 11   Ht 6' (1.829 m)   Wt 117.9 kg   SpO2 97%   BMI 35.26 kg/m   Physical Exam Vitals signs and nursing note reviewed.  Constitutional:      General: He is not in acute distress.    Appearance: He is well-developed.  HENT:     Head: Normocephalic and atraumatic.  Eyes:     Conjunctiva/sclera: Conjunctivae normal.     Pupils: Pupils are  equal, round, and reactive to light.  Neck:     Musculoskeletal: Normal range of motion and neck supple.  Cardiovascular:     Rate and Rhythm: Regular rhythm. Bradycardia present.     Heart sounds: Normal heart sounds.  Pulmonary:     Effort: Pulmonary effort is normal. No respiratory distress.     Breath sounds: Normal breath sounds.  Abdominal:     General: There is no distension.     Palpations: Abdomen is soft.     Tenderness:  There is no abdominal tenderness.  Musculoskeletal: Normal range of motion.        General: No deformity.  Skin:    General: Skin is warm and dry.  Neurological:     Mental Status: He is alert and oriented to person, place, and time.      ED Treatments / Results  Labs (all labs ordered are listed, but only abnormal results are displayed) Labs Reviewed  CBC WITH DIFFERENTIAL/PLATELET - Abnormal; Notable for the following components:      Result Value   Hemoglobin 11.5 (*)    HCT 36.5 (*)    RDW 17.5 (*)    All other components within normal limits  SARS CORONAVIRUS 2 (HOSPITAL ORDER, [facility])  COMPREHENSIVE METABOLIC PANEL  I-STAT TROPONIN, ED    EKG EKG Interpretation  Date/Time:  Monday March 31 2019 13:16:44 EDT Ventricular Rate:  43 PR Interval:    QRS Duration: 164 QT Interval:  507 QTC Calculation: 429 R Axis:   -4 Text Interpretation:  Junctional rhythm Ventricular premature complex Right bundle branch block Confirmed by Dene Gentry [phone]) on 03/31/2019 1:42:55 PM   Radiology Dg Chest Port 1 View  Result Date: 03/31/2019 CLINICAL DATA:  Chest pain and shortness of breath EXAM: PORTABLE CHEST 1 VIEW COMPARISON:  Portable exam 1334 hours without priors for comparison FINDINGS: Enlargement of cardiac silhouette with pulmonary vascular congestion. Atherosclerotic calcification aorta. Hazy LEFT basilar infiltrate. Remaining lungs clear. No pleural effusion or pneumothorax. Osseous structures  unremarkable. IMPRESSION: Enlargement of cardiac silhouette with pulmonary vascular congestion. Hazy LEFT basilar infiltrate question pneumonia. Electronically Signed   By: Lavonia Dana M.D.   On: 03/31/2019 13:45    Procedures Procedures (including critical care time) CRITICAL CARE Performed by: Valarie Merino   Total critical care time: 30 minutes  Critical care time was exclusive of separately billable procedures and treating other patients.  Critical care was necessary to treat or prevent imminent or life-threatening deterioration.  Critical care was time spent personally by me on the following activities: development of treatment plan with patient and/or surrogate as well as nursing, discussions with consultants, evaluation of patient's response to treatment, examination of patient, obtaining history from patient or surrogate, ordering and performing treatments and interventions, ordering and review of laboratory studies, ordering and review of radiographic studies, pulse oximetry and re-evaluation of patient's condition.   Medications Ordered in ED Medications - No data to display   Initial Impression / Assessment and Plan / ED Course  I have reviewed the triage vital signs and the nursing notes.  Pertinent labs & imaging results that were available during my care of the patient were reviewed by me and considered in my medical decision making (see chart for details).  Clinical Course as of Mar 30 1436  Mon Mar 31, 2019  1436 Potassium(!!): 6.5 [PM]    Clinical Course User Index [PM] Valarie Merino, MD       MDM  Screen complete  SAJAD GLANDER was evaluated in Emergency Department on 03/31/2019 for the symptoms described in the history of present illness. He was evaluated in the context of the global COVID-19 pandemic, which necessitated consideration that the patient might be at risk for infection with the SARS-CoV-2 virus that causes COVID-19. Institutional protocols  and algorithms that pertain to the evaluation of patients at risk for COVID-19 are in a state of rapid change based on information released by regulatory bodies including the CDC and  federal and state organizations. These policies and algorithms were followed during the patient's care in the ED.  Patient is presenting for evaluation of reported bradycardia.  His heart rate is noted to be in the low 40s.  His blood pressure is well maintained.  He appears to be comfortable during the exam.  Patient does take Coreg.   Screening labs will be obtained.  Patient is found to be mildly hyperkalemic with elevated creatinine.  Hyperkalemia treated.  Would benefit from admission for further work-up and treatment.  Hospitalist -Dr. Secundino Ginger aware of case and will evaluate for admission.   Final Clinical Impressions(s) / ED Diagnoses   Final diagnoses:  Bradycardia  Hyperkalemia    ED Discharge Orders    None       Valarie Merino, MD 03/31/19 1526

## 2019-03-31 NOTE — Consult Note (Signed)
[place] Date: 03/31/2019 03/31/2019 Rexene Agent Requesting Physician:  Regenia Skeeter  Reason for Consult:  AoCKD5, Hyperkalemia HPI:  78 year old male presented to the emergency department after recommendation by PMD for the evaluation of several days of weakness, found to have mild bradycardia with heart rate in the 40s.  PMH Incudes:  Hypertension: Medications include lisinopril 40 mg daily, carvedilol 12.5 mg twice daily, doxazosin 8 mg nightly, Lasix 80 mg daily, amlodipine 10 mg daily  Hyperlipidemia  DM 2  OSA  Hypothyroidism\  BPH  CKD 5: Followed by Deterding --> Bhandari, last seen 04/20220; has L mature RC AVF; anemia on retacrit 30,000 units every 3 weeks; secondary hyperparathyroidism on calcitriol.  Presumed diabetic and hypertensive etiology.  Baseline creatinine is 3.9 based upon values from June of last year in January of this year.  Patient denies use of nonsteroidals.  Denies worsened polyuria, incontinence, hesitancy, nocturia, suprapubic tenderness.  Continue to take his lisinopril throughout.  No significant diarrhea/nausea/vomiting.  No dysgeusia, anorexia, peripheral edema, confusion.  In work-up in the emergency room heart rate was in the 40s.  EKG is junctional rhythm.  Potassium 6.5 with serum bicarbonate of 18, anion gap of 13.  BUN 113 and serum creatinine 4.57; 4 hours later potassium had normalized to 4.7 and bicarbonate was 17.  In ER patient was treated with insulin/dextrose, 1 amp of sodium bicarbonate, 8.4 g of Veltassa.     Creatinine, Ser (mg/dL)  Date Value  03/31/2019 4.39 (H)  03/31/2019 4.57 (H)  ] Balance of 12 systems is negative w/ exceptions as above  PMH  Past Medical History:  Diagnosis Date  . Allergic rhinitis   . Anemia    low iron  . Chronic kidney disease    Followed by Dr Deterding    . Diabetes mellitus    Type II  . Hyperlipidemia   . Hypertension   . Hypothyroidism   . Sleep apnea    uses a cpap    PSH  Past Surgical History:  Procedure Laterality Date  . ADRENALECTOMY  1993   left  . AV FISTULA PLACEMENT Left 05/06/2018   Procedure: ARTERIOVENOUS (AV) FISTULA CREATION RADIOCEPHALIC;  Surgeon: [name], MD;  Location: [place];  Service: Vascular;  Laterality: Left;  . COLONOSCOPY W/ POLYPECTOMY    . REVISION OF ARTERIOVENOUS GORETEX GRAFT Left 07/11/2018   Procedure: TRANSPOSITION OF RADIOCEPHALIC  ARTERIOVENOUS FISTULA LEFT ARM;  Surgeon: [name], MD;  Location: [facility];  Service: Vascular;  Laterality: Left;   FH  Family History  Problem Relation Age of Onset  . Prostate cancer Father   . Kidney failure Mother   . Lung cancer Sister    SH  reports that he quit smoking about 50 years ago. His smoking use included cigarettes. He has a 0.90 pack-year smoking history. He has never used smokeless tobacco. He reports that he does not drink alcohol or use drugs. Allergies  Allergies  Allergen Reactions  . Penicillins Other (See Comments)    UNSPECIFIED REACTION  Has patient had a PCN reaction causing immediate rash, facial/tongue/throat swelling, SOB or lightheadedness with hypotension: Unknown Has patient had a PCN reaction causing severe rash involving mucus membranes or skin necrosis: Unknown Has patient had a PCN reaction that required hospitalization: Unknown Has patient had a PCN reaction occurring within the last 10 years: No If all of the above answers are "NO", then may proceed with Cephalosporin use.    Home medications Prior  to Admission medications   Medication Sig Start Date End Date Taking? Authorizing Provider  allopurinol (ZYLOPRIM) 100 MG tablet Take 100 mg by mouth 2 (two) times daily.  12/07/15  Yes [provider]  amLODipine (NORVASC) 10 MG tablet Take 10 mg by mouth at bedtime.    Yes [provider]  aspirin EC 81 MG tablet Take 81 mg by mouth daily.   Yes [provider]  atorvastatin (LIPITOR) 40 MG tablet Take 40 mg by  mouth at bedtime.    Yes [provider]  calcitRIOL (ROCALTROL) 0.25 MCG capsule Take 0.25 mcg by mouth daily.  12/07/15  Yes [provider]  carvedilol (COREG) 25 MG tablet Take 12.5 mg by mouth 2 (two) times daily with a meal.    Yes [provider]  doxazosin (CARDURA) 8 MG tablet Take 8 mg by mouth at bedtime.     Yes [provider]  fluticasone (FLONASE) 50 MCG/ACT nasal spray Place 2 sprays into both nostrils daily.  03/31/18  Yes [provider]  furosemide (LASIX) 80 MG tablet Take by mouth daily.    Yes [provider]  Homeopathic Products [facility] ALLERGY EYE RELIEF) SOLN Place 1-2 drops into both eyes daily as needed (for dry eyes).   Yes [provider]  Insulin Isophane & Regular Human (HUMULIN 70/30 MIX) (70-30) 100 UNIT/ML PEN Inject 16-20 Units into the skin See admin instructions. Inject 20 units subcutaneously before breakfast and inject 16 units subcutaneously before supper   Yes [provider]  levothyroxine (SYNTHROID, LEVOTHROID) 75 MCG tablet Take 75 mcg by mouth daily before breakfast.   Yes [provider]  lisinopril (PRINIVIL,ZESTRIL) 40 MG tablet Take 40 mg by mouth at bedtime.    Yes [provider]  montelukast (SINGULAIR) 10 MG tablet Take 10 mg by mouth daily.  01/01/16  Yes [provider]  Multiple Vitamin (MULTIVITAMIN WITH MINERALS) TABS tablet Take 1 tablet by mouth daily.   Yes [provider]  PRESCRIPTION MEDICATION Inhale into the lungs at bedtime. CPAP   Yes [provider]  testosterone cypionate (DEPOTESTOSTERONE CYPIONATE) 200 MG/ML injection Inject 200 mg into the muscle every 21 ( twenty-one) days. 04/23/18  Yes [provider]  oxyCODONE-acetaminophen (PERCOCET/ROXICET) 5-325 MG tablet Take 1 tablet by mouth every 6 (six) hours as needed. Patient not taking: Reported on 03/31/2019 07/11/18   Ulyses Amor, PA-C    Current  Medications Scheduled Meds: . allopurinol  100 mg Oral BID  . amLODipine  10 mg Oral QHS  . [START ON 04/01/2019] aspirin EC  81 mg Oral Daily  . atorvastatin  40 mg Oral QHS  . [START ON 04/01/2019] calcitRIOL  0.25 mcg Oral Daily  . doxazosin  8 mg Oral QHS  . [START ON 04/01/2019] fluticasone  2 spray Each Nare Daily  . [START ON 04/01/2019] furosemide  80 mg Oral Daily  . heparin  5,000 Units Subcutaneous Q8H  . [START ON 04/01/2019] insulin aspart  0-15 Units Subcutaneous TID WC  . insulin aspart  0-5 Units Subcutaneous QHS  . [START ON 04/01/2019] levothyroxine  75 mcg Oral QAC breakfast  . [START ON 04/01/2019] montelukast  10 mg Oral Daily  . sodium chloride flush  3 mL Intravenous Q12H   Continuous Infusions: PRN Meds:.acetaminophen **OR** acetaminophen, albuterol, ondansetron **OR** ondansetron (ZOFRAN) IV  CBC Recent Labs  Lab 03/31/19 1330  WBC 8.9  NEUTROABS 6.3  HGB 11.5*  HCT 36.5*  MCV  85.9  PLT 239   Basic Metabolic Panel Recent Labs  Lab 03/31/19 1330 03/31/19 1723  NA 130* 130*  K 6.5* 4.7  CL 99 100  CO2 18* 17*  GLUCOSE 112* 109*  BUN 113* 113*  CREATININE 4.57* 4.39*  CALCIUM 8.9 8.9    Physical Exam  Blood pressure (!) 155/39, pulse (!) 48, temperature (!) 97.5 F (36.4 C), temperature source Oral, resp. rate 20, height 6' (1.829 m), weight 117.9 kg, SpO2 94 %. GEN: Well-appearing elderly male, appears younger than stated age, no apparent distress, lying flat on the bed without supplemental oxygen and breathing normally. ENT: NCAT EYES: EOMI CV: RRR, normal S1-S2, no murmur or rub PULM: Clear bilaterally, normal work of breathing ABD: Soft, nontender, bowel sounds present, no suprapubic tenderness SKIN: No rashes or lesions VASCULAR: AV fistula appears mature with bruit and thrill EXT: No lower extremity edema  Assessment 78 year old male with CKD 5, presenting with weakness and found to have bradycardia with junctional rhythm on EKG,  hyperkalemia at 6.5.  1. Hyperkalemia: Likely due to low GFR, full dose ACE inhibitor, ? Diet; improved to 4.7 on recheck 2. Junctional bradycardia: Per admitting team: Beta-blocker on hold, potassium improved 3. Mild metabolic acidosis with anion gap of 13, likely related to renal failure 4. Anemia, on ESA as outpatient, at goal 5. Left forearm radiocephalic AV fistula: Stable and mature 6. Secondary hyperparathyroidism on calcitriol  Plan 1. Agree with holding lisinopril, would also hold furosemide at the current time 2. Check renal ultrasound, doubt obstruction 3. Continue other adjunct of medications with exception of beta-blocker 4. For now would suggest gentle oral hydration, renal diet; if fails to improve we will add normal saline 5. If acidosis persists need to consider use of sodium bicarbonate 6. Daily weights, Daily Renal Panel, Strict I/Os, Avoid nephrotoxins (NSAIDs, judicious IV Contrast)    Pearson Grippe MD 03/31/2019, 7:30 PM

## 2019-03-31 NOTE — H&P (Addendum)
History and Physical    David Nguyen JSH:[id number] DOB: Jan 29, 1941 DOA: 03/31/2019  Referring MD/NP/PA: Dene Gentry, MD PCP: [name], MD  Patient coming from: Home  Chief Complaint: Weakness  I have personally briefly reviewed patient's old medical records in [place]   HPI: David Nguyen is a 78 y.o. male with medical history significant of HTN, HLD, Ckd disease stage IV/V, hypothyroidism, DM type II, OSA on CPAP, and iron deficiency anemia; who presented with complaints of generalized weakness.  He complains of feeling generalized weakness and dizziness for the last several days.  He was seen by his primary care provider for further evaluation of symptoms and was found to be bradycardic into the 40s.  He is on medications of Coreg for treatment of high blood pressure which he takes as prescribed.  Associated symptoms include some leg swelling.  Denies having any focal weakness, fever, chest pain, loss of consciousness, falls, dysuria, or shortness of breath.  In outpatient setting patient reports that he is followed by Dr. Jimmy Footman of [facility].  He has a left forearm fistula in place, but is not on dialysis.  Patient manages his own medications.  ED Course: Upon admission to the emergency department-was seen to be afebrile, heart rate is 44, blood pressure 139/52, and all other vital signs maintained.  Labs revealed WBC 8.9, hemoglobin 11.5, sodium 130, potassium 6.5, BUN 13, creatinine 4.57, troponin 0.02,AST 47, and ALT 51.  COVID-19 screening negative.  Showed enlargement of the cardiac silhouette with pulmonary vascular congestion and hazy left basilar infiltrate questionable for pneumonia.  Patient was given an amp of sodium bicarb, 5 units of insulin, and amp of dextrose.  Nephrology Dr. Joni Fears was consulted and recommended Veltassa 8.4 g to be given.  Review of Systems  Constitutional: Positive for malaise/fatigue.  HENT: Negative for sinus  pain.   Eyes: Negative for double vision and photophobia.  Respiratory: Negative for cough and shortness of breath.   Cardiovascular: Positive for leg swelling. Negative for chest pain.  Gastrointestinal: Negative for abdominal pain, blood in stool, nausea and vomiting.  Genitourinary: Negative for dysuria and frequency.  Musculoskeletal: Negative for falls.  Neurological: Positive for weakness. Negative for focal weakness and loss of consciousness.  Psychiatric/Behavioral: Negative for memory loss and substance abuse.    Past Medical History:  Diagnosis Date  . Allergic rhinitis   . Anemia    low iron  . Chronic kidney disease    Followed by Dr Deterding    . Diabetes mellitus    Type II  . Hyperlipidemia   . Hypertension   . Hypothyroidism   . Sleep apnea    uses a cpap    Past Surgical History:  Procedure Laterality Date  . ADRENALECTOMY  1993   left  . AV FISTULA PLACEMENT Left 05/06/2018   Procedure: ARTERIOVENOUS (AV) FISTULA CREATION RADIOCEPHALIC;  Surgeon: [name], MD;  Location: [place];  Service: Vascular;  Laterality: Left;  . COLONOSCOPY W/ POLYPECTOMY    . REVISION OF ARTERIOVENOUS GORETEX GRAFT Left 07/11/2018   Procedure: TRANSPOSITION OF RADIOCEPHALIC  ARTERIOVENOUS FISTULA LEFT ARM;  Surgeon: [name], MD;  Location: [facility];  Service: Vascular;  Laterality: Left;     reports that he quit smoking about 50 years ago. His smoking use included cigarettes. He has a 0.90 pack-year smoking history. He has never used smokeless tobacco. He reports that he does not drink alcohol or use drugs.  Allergies  Allergen Reactions  . Penicillins Other (See Comments)    UNSPECIFIED REACTION  Has patient had a PCN reaction causing immediate rash, facial/tongue/throat swelling, SOB or lightheadedness with hypotension: Unknown Has patient had a PCN reaction causing severe rash involving mucus membranes or skin necrosis: Unknown Has patient had a PCN reaction that  required hospitalization: Unknown Has patient had a PCN reaction occurring within the last 10 years: No If all of the above answers are "NO", then may proceed with Cephalosporin use.     Family History  Problem Relation Age of Onset  . Prostate cancer Father   . Kidney failure Mother   . Lung cancer Sister     Prior to Admission medications   Medication Sig Start Date End Date Taking? Authorizing Provider  allopurinol (ZYLOPRIM) 100 MG tablet Take 100 mg by mouth 2 (two) times daily.  12/07/15  Yes [provider]  amLODipine (NORVASC) 10 MG tablet Take 10 mg by mouth at bedtime.    Yes [provider]  aspirin EC 81 MG tablet Take 81 mg by mouth daily.   Yes [provider]  atorvastatin (LIPITOR) 40 MG tablet Take 40 mg by mouth at bedtime.    Yes [provider]  calcitRIOL (ROCALTROL) 0.25 MCG capsule Take 0.25 mcg by mouth daily.  12/07/15  Yes [provider]  carvedilol (COREG) 25 MG tablet Take 12.5 mg by mouth 2 (two) times daily with a meal.    Yes [provider]  doxazosin (CARDURA) 8 MG tablet Take 8 mg by mouth at bedtime.     Yes [provider]  fluticasone (FLONASE) 50 MCG/ACT nasal spray Place 2 sprays into both nostrils daily.  03/31/18  Yes [provider]  furosemide (LASIX) 80 MG tablet Take by mouth daily.    Yes [provider]  Homeopathic Products [facility] ALLERGY EYE RELIEF) SOLN Place 1-2 drops into both eyes daily as needed (for dry eyes).   Yes [provider]  Insulin Isophane & Regular Human (HUMULIN 70/30 MIX) (70-30) 100 UNIT/ML PEN Inject 16-20 Units into the skin See admin instructions. Inject 20 units subcutaneously before breakfast and inject 16 units subcutaneously before supper   Yes [provider]  levothyroxine (SYNTHROID, LEVOTHROID) 75 MCG tablet Take 75 mcg by mouth daily before breakfast.   Yes [provider]  lisinopril  (PRINIVIL,ZESTRIL) 40 MG tablet Take 40 mg by mouth at bedtime.    Yes [provider]  montelukast (SINGULAIR) 10 MG tablet Take 10 mg by mouth daily.  01/01/16  Yes [provider]  Multiple Vitamin (MULTIVITAMIN WITH MINERALS) TABS tablet Take 1 tablet by mouth daily.   Yes [provider]  PRESCRIPTION MEDICATION Inhale into the lungs at bedtime. CPAP   Yes [provider]  testosterone cypionate (DEPOTESTOSTERONE CYPIONATE) 200 MG/ML injection Inject 200 mg into the muscle every 21 ( twenty-one) days. 04/23/18  Yes [provider]  oxyCODONE-acetaminophen (PERCOCET/ROXICET) 5-325 MG tablet Take 1 tablet by mouth every 6 (six) hours as needed. Patient not taking: Reported on 03/31/2019 07/11/18   Ulyses Amor, PA-C    Physical Exam:  Constitutional: Obese elderly male in no acute distress Vitals:   03/31/19 1317 03/31/19 1319 03/31/19 1321  BP:  (!) 139/52   Pulse:  (!) 44   Resp:  11   Temp:  (!) 97.5 F (36.4 C)   TempSrc:  Oral   SpO2:  97% 97%  Weight: 117.9 kg  Height: 6' (1.829 m)     Eyes: PERRL, lids and conjunctivae normal ENMT: Mucous membranes are moist. Posterior pharynx clear of any exudate or lesions.  Neck: normal, supple, no masses, no thyromegaly Respiratory: clear to auscultation bilaterally, no wheezing, no crackles. Normal respiratory effort. No accessory muscle use.  Cardiovascular: Bradycardic, no murmurs / rubs / gallops.  Trace 1+ lower extremity edema. 2+ pedal pulses. No carotid bruits.  Abdomen: no tenderness, no masses palpated. No hepatosplenomegaly. Bowel sounds positive.  Musculoskeletal: no clubbing / cyanosis. No joint deformity upper and lower extremities. Good ROM, no contractures. Normal muscle tone.  Skin: no rashes, lesions, ulcers. No induration Neurologic: CN 2-12 grossly intact. Sensation intact, DTR normal. Strength 5/5 in all 4.  Psychiatric: Normal judgment and insight. Alert and oriented x  3. Normal mood.     Labs on Admission: I have personally reviewed following labs and imaging studies  CBC: Recent Labs  Lab 03/31/19 1330  WBC 8.9  NEUTROABS 6.3  HGB 11.5*  HCT 36.5*  MCV 85.9  PLT 297   Basic Metabolic Panel: Recent Labs  Lab 03/31/19 1330  NA 130*  K 6.5*  CL 99  CO2 18*  GLUCOSE 112*  BUN 113*  CREATININE 4.57*  CALCIUM 8.9   GFR: Estimated Creatinine Clearance: 17.1 mL/min (A) (by C-G formula based on SCr of 4.57 mg/dL (H)). Liver Function Tests: Recent Labs  Lab 03/31/19 1330  AST 47*  ALT 51*  ALKPHOS 71  BILITOT 1.1  PROT 6.1*  ALBUMIN 3.4*   No results for input(s): LIPASE, AMYLASE in the last 168 hours. No results for input(s): AMMONIA in the last 168 hours. Coagulation Profile: No results for input(s): INR, PROTIME in the last 168 hours. Cardiac Enzymes: No results for input(s): CKTOTAL, CKMB, CKMBINDEX, TROPONINI in the last 168 hours. BNP (last 3 results) No results for input(s): PROBNP in the last 8760 hours. HbA1C: No results for input(s): HGBA1C in the last 72 hours. CBG: Recent Labs  Lab 03/31/19 1456 03/31/19 1620  GLUCAP 109* 119*   Lipid Profile: No results for input(s): CHOL, HDL, LDLCALC, TRIG, CHOLHDL, LDLDIRECT in the last 72 hours. Thyroid Function Tests: No results for input(s): TSH, T4TOTAL, FREET4, T3FREE, THYROIDAB in the last 72 hours. Anemia Panel: No results for input(s): VITAMINB12, FOLATE, FERRITIN, TIBC, IRON, RETICCTPCT in the last 72 hours. Urine analysis: No results found for: COLORURINE, APPEARANCEUR, LABSPEC, [place], GLUCOSEU, HGBUR, BILIRUBINUR, KETONESUR, PROTEINUR, UROBILINOGEN, NITRITE, LEUKOCYTESUR Sepsis Labs: Recent Results (from the past 240 hour(s))  SARS Coronavirus 2 (CEPHEID - Performed in [place] hospital lab), Hosp Order     Status: None   Collection Time: 03/31/19  1:57 PM   Specimen: Nasopharyngeal Swab  Result Value Ref Range Status   SARS Coronavirus 2 NEGATIVE  NEGATIVE Final    Comment: (NOTE) If result is NEGATIVE SARS-CoV-2 target nucleic acids are NOT DETECTED. The SARS-CoV-2 RNA is generally detectable in upper and lower  respiratory specimens during the acute phase of infection. The lowest  concentration of SARS-CoV-2 viral copies this assay can detect is 250  copies / mL. A negative result does not preclude SARS-CoV-2 infection  and should not be used as the sole basis for treatment or other  patient management decisions.  A negative result may occur with  improper specimen collection / handling, submission of specimen other  than nasopharyngeal swab, presence of viral mutation(s) within the  areas targeted by this assay, and inadequate number of viral copies  (<250  copies / mL). A negative result must be combined with clinical  observations, patient history, and epidemiological information. If result is POSITIVE SARS-CoV-2 target nucleic acids are DETECTED. The SARS-CoV-2 RNA is generally detectable in upper and lower  respiratory specimens dur ing the acute phase of infection.  Positive  results are indicative of active infection with SARS-CoV-2.  Clinical  correlation with patient history and other diagnostic information is  necessary to determine patient infection status.  Positive results do  not rule out bacterial infection or co-infection with other viruses. If result is PRESUMPTIVE POSTIVE SARS-CoV-2 nucleic acids MAY BE PRESENT.   A presumptive positive result was obtained on the submitted specimen  and confirmed on repeat testing.  While 2019 novel coronavirus  (SARS-CoV-2) nucleic acids may be present in the submitted sample  additional confirmatory testing may be necessary for epidemiological  and / or clinical management purposes  to differentiate between  SARS-CoV-2 and other Sarbecovirus currently known to infect humans.  If clinically indicated additional testing with an alternate test  methodology [id number]) is  advised. The SARS-CoV-2 RNA is generally  detectable in upper and lower respiratory sp ecimens during the acute  phase of infection. The expected result is Negative. Fact Sheet for Patients:  StrictlyIdeas.no Fact Sheet for Healthcare Providers: BankingDealers.co.za This test is not yet approved or cleared by the [place] FDA and has been authorized for detection and/or diagnosis of SARS-CoV-2 by FDA under an Emergency Use Authorization (EUA).  This EUA will remain in effect (meaning this test can be used) for the duration of the COVID-19 declaration under Section 564(b)(1) of the Act, 21 U.S.C. section 360bbb-3(b)(1), unless the authorization is terminated or revoked sooner. Performed at [facility], [place] [address], [place], [place] [address]      Radiological Exams on Admission: Dg Chest Port 1 View  Result Date: 03/31/2019 CLINICAL DATA:  Chest pain and shortness of breath EXAM: PORTABLE CHEST 1 VIEW COMPARISON:  Portable exam 1334 hours without priors for comparison FINDINGS: Enlargement of cardiac silhouette with pulmonary vascular congestion. Atherosclerotic calcification aorta. Hazy LEFT basilar infiltrate. Remaining lungs clear. No pleural effusion or pneumothorax. Osseous structures unremarkable. IMPRESSION: Enlargement of cardiac silhouette with pulmonary vascular congestion. Hazy LEFT basilar infiltrate question pneumonia. Electronically Signed   By: Lavonia Dana M.D.   On: 03/31/2019 13:45    EKG: Independently reviewed.  Junctional rhythm at 43 bpm with premature ventricular contraction and RBBB  Assessment/Plan Bradycardia: Heart rates on admission noted to be in the low 40s.  Patient is on Coreg 12.5 mg twice daily which could be contributing to symptoms. Patient asymptomatic while sitting still, but notes that he is been more tired lately.  Chest x-ray noted cardiomegaly without overt edema. -Admit to a telemetry  bed -Hold Coreg  -Follow-up telemetry overnight -Check echocardiogram -Consider cardiology consulted if heart rates do not improve   Hyperkalemia: Potassium 6.5 on admission.  The patient had been given insulin, amp of dextrose, and 8.4 g of Veltassa.  Patient on lisinopril which could likely be contributing to symptoms in addition to end-stage renal disease. -Follow-up repeat BMP  Chronic kidney disease stage V: Patient with left upper extremity fistula, but reports not currently being on dialysis.  Potassium 6.5, Cr 4.57, and BUN 113. Still reporting inability to urinate. Following  with Dr. Jimmy Footman of Nephrology in the outpatient setting. Nephrology consulted no previous creatinine to compare. -Continue Calcitrol -Follow-up BMP in a.m. -Follow-up with nephrology for any additional recommendation  Diabetes mellitus type 2: At home patient on 70/30 Humulin taking 20 units every morning and 16 units before supper.  Sugars in the low teens on admission. -Hypoglycemic protocol -CBGs q. before meals with moderate SSI -Restart home insulin regimen when medically appropriate  Left basilar opacity: Patient denies any reports of any fever, cough, or shortness of breath.  Chest x-ray reveals left basilar opacity concerning for possible pneumonia. -Check procalcitonin  Essential hypertension: Blood pressures appear well controlled at this time. -Hold lisinopril due to hyperkalemia -Hold Coreg due to bradycardia -Continue amlodipine, doxazosin, and furosemide  Anemia of chronic disease: Hemoglobin 11.5g/dL on admission.  Patient denies any reports of bleeding. -Continue to monitor  Hypothyroidism -Check TSH -Continue levothyroxine  Hyponatremia  OSA on CPAP -Continue CPAP at night per RT  Obesity: BMI elevated 35. to 6 kg/m   DVT prophylaxis: Heparin Code Status: Full Family Communication: Discussed plan of care with patient's wife over the phone Disposition Plan: Possible  discharge home in 2 to 3 days Consults called: Neurology Admission status: Observation  Norval Morton MD [facility] Pager [phone]   If 7PM-7AM, please contact night-coverage [email or web address] Password [facility]  03/31/2019, 5:00 PM

## 2019-03-31 NOTE — ED Notes (Signed)
Admitting MD at bedside.

## 2019-04-01 ENCOUNTER — Observation Stay (HOSPITAL_COMMUNITY): Payer: Medicare HMO

## 2019-04-01 ENCOUNTER — Encounter (HOSPITAL_COMMUNITY): Payer: Self-pay | Admitting: Internal Medicine

## 2019-04-01 DIAGNOSIS — E1122 Type 2 diabetes mellitus with diabetic chronic kidney disease: Secondary | ICD-10-CM

## 2019-04-01 DIAGNOSIS — G4733 Obstructive sleep apnea (adult) (pediatric): Secondary | ICD-10-CM | POA: Diagnosis present

## 2019-04-01 DIAGNOSIS — Z79899 Other long term (current) drug therapy: Secondary | ICD-10-CM | POA: Diagnosis not present

## 2019-04-01 DIAGNOSIS — R000 Tachycardia, unspecified: Secondary | ICD-10-CM | POA: Diagnosis present

## 2019-04-01 DIAGNOSIS — Z794 Long term (current) use of insulin: Secondary | ICD-10-CM | POA: Diagnosis not present

## 2019-04-01 DIAGNOSIS — I132 Hypertensive heart and chronic kidney disease with heart failure and with stage 5 chronic kidney disease, or end stage renal disease: Secondary | ICD-10-CM | POA: Diagnosis present

## 2019-04-01 DIAGNOSIS — N281 Cyst of kidney, acquired: Secondary | ICD-10-CM | POA: Diagnosis not present

## 2019-04-01 DIAGNOSIS — E669 Obesity, unspecified: Secondary | ICD-10-CM | POA: Diagnosis present

## 2019-04-01 DIAGNOSIS — I361 Nonrheumatic tricuspid (valve) insufficiency: Secondary | ICD-10-CM | POA: Diagnosis not present

## 2019-04-01 DIAGNOSIS — N179 Acute kidney failure, unspecified: Secondary | ICD-10-CM | POA: Diagnosis not present

## 2019-04-01 DIAGNOSIS — E872 Acidosis: Secondary | ICD-10-CM | POA: Diagnosis present

## 2019-04-01 DIAGNOSIS — I272 Pulmonary hypertension, unspecified: Secondary | ICD-10-CM | POA: Diagnosis present

## 2019-04-01 DIAGNOSIS — T464X5A Adverse effect of angiotensin-converting-enzyme inhibitors, initial encounter: Secondary | ICD-10-CM | POA: Diagnosis present

## 2019-04-01 DIAGNOSIS — N186 End stage renal disease: Secondary | ICD-10-CM | POA: Diagnosis present

## 2019-04-01 DIAGNOSIS — Z79890 Hormone replacement therapy (postmenopausal): Secondary | ICD-10-CM | POA: Diagnosis not present

## 2019-04-01 DIAGNOSIS — I10 Essential (primary) hypertension: Secondary | ICD-10-CM | POA: Diagnosis not present

## 2019-04-01 DIAGNOSIS — R531 Weakness: Secondary | ICD-10-CM | POA: Diagnosis not present

## 2019-04-01 DIAGNOSIS — R001 Bradycardia, unspecified: Secondary | ICD-10-CM

## 2019-04-01 DIAGNOSIS — D638 Anemia in other chronic diseases classified elsewhere: Secondary | ICD-10-CM | POA: Diagnosis not present

## 2019-04-01 DIAGNOSIS — E871 Hypo-osmolality and hyponatremia: Secondary | ICD-10-CM | POA: Diagnosis present

## 2019-04-01 DIAGNOSIS — E785 Hyperlipidemia, unspecified: Secondary | ICD-10-CM | POA: Diagnosis present

## 2019-04-01 DIAGNOSIS — N189 Chronic kidney disease, unspecified: Secondary | ICD-10-CM | POA: Diagnosis not present

## 2019-04-01 DIAGNOSIS — I5032 Chronic diastolic (congestive) heart failure: Secondary | ICD-10-CM | POA: Diagnosis present

## 2019-04-01 DIAGNOSIS — Z1159 Encounter for screening for other viral diseases: Secondary | ICD-10-CM | POA: Diagnosis not present

## 2019-04-01 DIAGNOSIS — Z7982 Long term (current) use of aspirin: Secondary | ICD-10-CM | POA: Diagnosis not present

## 2019-04-01 DIAGNOSIS — E875 Hyperkalemia: Secondary | ICD-10-CM | POA: Diagnosis present

## 2019-04-01 DIAGNOSIS — E039 Hypothyroidism, unspecified: Secondary | ICD-10-CM | POA: Diagnosis present

## 2019-04-01 DIAGNOSIS — N185 Chronic kidney disease, stage 5: Secondary | ICD-10-CM | POA: Diagnosis not present

## 2019-04-01 DIAGNOSIS — N2581 Secondary hyperparathyroidism of renal origin: Secondary | ICD-10-CM | POA: Diagnosis present

## 2019-04-01 DIAGNOSIS — Q631 Lobulated, fused and horseshoe kidney: Secondary | ICD-10-CM

## 2019-04-01 DIAGNOSIS — J309 Allergic rhinitis, unspecified: Secondary | ICD-10-CM | POA: Diagnosis present

## 2019-04-01 DIAGNOSIS — D631 Anemia in chronic kidney disease: Secondary | ICD-10-CM | POA: Diagnosis present

## 2019-04-01 DIAGNOSIS — N184 Chronic kidney disease, stage 4 (severe): Secondary | ICD-10-CM

## 2019-04-01 DIAGNOSIS — N400 Enlarged prostate without lower urinary tract symptoms: Secondary | ICD-10-CM | POA: Diagnosis present

## 2019-04-01 LAB — ECHOCARDIOGRAM COMPLETE
Height: 72 in
Weight: 4248 oz

## 2019-04-01 LAB — CBC
HCT: 34.2 % — ABNORMAL LOW (ref 39.0–52.0)
Hemoglobin: 11 g/dL — ABNORMAL LOW (ref 13.0–17.0)
MCH: 26.8 pg (ref 26.0–34.0)
MCHC: 32.2 g/dL (ref 30.0–36.0)
MCV: 83.2 fL (ref 80.0–100.0)
Platelets: 171 10*3/uL (ref 150–400)
RBC: 4.11 MIL/uL — ABNORMAL LOW (ref 4.22–5.81)
RDW: 16.8 % — ABNORMAL HIGH (ref 11.5–15.5)
WBC: 8.1 10*3/uL (ref 4.0–10.5)
nRBC: 0 % (ref 0.0–0.2)

## 2019-04-01 LAB — BASIC METABOLIC PANEL
Anion gap: 11 (ref 5–15)
BUN: 118 mg/dL — ABNORMAL HIGH (ref 8–23)
CO2: 20 mmol/L — ABNORMAL LOW (ref 22–32)
Calcium: 8.9 mg/dL (ref 8.9–10.3)
Chloride: 101 mmol/L (ref 98–111)
Creatinine, Ser: 4.7 mg/dL — ABNORMAL HIGH (ref 0.61–1.24)
GFR calc Af Amer: 13 mL/min — ABNORMAL LOW (ref >60)
GFR calc non Af Amer: 11 mL/min — ABNORMAL LOW (ref >60)
Glucose, Bld: 121 mg/dL — ABNORMAL HIGH (ref 70–99)
Potassium: 4.8 mmol/L (ref 3.5–5.1)
Sodium: 132 mmol/L — ABNORMAL LOW (ref 135–145)

## 2019-04-01 LAB — GLUCOSE, CAPILLARY
Glucose-Capillary: 116 mg/dL — ABNORMAL HIGH (ref 70–99)
Glucose-Capillary: 152 mg/dL — ABNORMAL HIGH (ref 70–99)
Glucose-Capillary: 107 mg/dL — ABNORMAL HIGH (ref 70–99)
Glucose-Capillary: 121 mg/dL — ABNORMAL HIGH (ref 70–99)

## 2019-04-01 LAB — MRSA PCR SCREENING: MRSA by PCR: NEGATIVE

## 2019-04-01 MED ORDER — SODIUM CHLORIDE 0.9 % IV SOLN
INTRAVENOUS | Status: AC
  Administered 2019-04-01: 18:00:00 via INTRAVENOUS

## 2019-04-01 NOTE — Progress Notes (Signed)
[facility] PROGRESS NOTE  David Nguyen JTT:[id number] DOB: 1941-08-02 DOA: 03/31/2019 PCP: [name], MD  Assessment/Plan: Bradycardia: Heart rates on admission noted to be in the low 40s.  Patient is on Coreg 12.5 mg twice daily in setting of possible worsening kidney disease. ekg reveals junctional rhythm RBBB. Patient asymptomatic while sitting still, but noted  more tired lately.  Chest x-ray noted cardiomegaly without overt edema. HR remains low inspite of holding coreg and lisinopril. TSH 3.6. echo results pending -Hold Coreg and lasix and lisinopril -follow echocardiogram -Consider cardiology consulted if heart rates do not improve   Hyperkalemia: Potassium 6.5 on admission. Evaluated by nephrology who opined likely related to low GFR, full dose ACE inhibitor and dietary indescretions. The patient had been given insulin, amp of dextrose, and 8.4 g of [place].  Patient on lisinopril which could likely be contributing to symptoms in addition to end-stage renal disease. Resolved this am.  -Follow-up repeat BMP  Chronic kidney disease stage V: Patient with left upper extremity fistula, but reports not currently being on dialysis.  Potassium 6.5, Cr 4.57, and BUN 113. evaluated by nephrology who recommended renal US that revealed a horseshoe kidney. No hydronephrosis.  -Continue Calcitrol -Follow-up BMP in a.m. -Follow-up with nephrology for any additional recommendation   Diabetes mellitus type 2: At home patient on 70/30 Humulin taking 20 units every morning and 16 units before supper.  Sugars in the low teens on admission. This am sugar low end of normal.  -Hypoglycemic protocol -CBGs q. before meals with moderate SSI -Restart home insulin regimen when medically appropriate  Left basilar opacity: Patient denied fever, cough, or shortness of breath.  Chest x-ray reveals left basilar opacity concerning for possible pneumonia. procalcitonin 0.13. patient remains afebrile  and non-toxic appearing.   Essential hypertension: controlled so far. Home meds include coreg, lisinopril, amlodipine, lasix, doxazosin.  -Holding lisinopril due to hyperkalemia -Hold Coreg due to bradycardia -hold lasix per nephrology recommendation -Continue amlodipine, doxazosin -monitor  Anemia of chronic disease: Hemoglobin 11.5g/dL on admission.  Patient denied any reports of bleeding. Stable.  -Continue to monitor  Hypothyroidism. tsh 3.602 -Continue levothyroxine  Hyponatremia. Sodium level 130 on admission. This am 132. -monitor  Metabolic acidosis. Likely related to worsening kidney disease.  co2 17 ib admission. This am CO2 20.  -monitor  OSA on CPAP -Continue CPAP at night per RT  Obesity: BMI elevated 35. to 6 kg/m  Code Status: full Family Communication: patient Disposition Plan: home hopefully tomorrow   Consultants:  [facility]  Procedures:    Antibiotics:    HPI/Subjective: 78 yo hx CKD stage 4 not on dialysis yet but had port placed 12/19, HTN, DM, BPH presented with cc weakness found to have brady cardia, hyperkalemia, hyponatremia, metabolic acidosis.  Sitting on side of bed eating. Denies pain/discomfort  Objective: Vitals:   04/01/19 0735 04/01/19 0915  BP: (!) 132/49 (!) 137/40  Pulse: (!) 43 (!) 45  Resp: 20 16  Temp: 98.8 F (37.1 C) 98 F (36.7 C)  SpO2: 97%     Intake/Output Summary (Last 24 hours) at 04/01/2019 1005 Last data filed at 04/01/2019 0910 Gross per 24 hour  Intake 550 ml  Output 950 ml  Net -400 ml   Filed Weights   03/31/19 1317 04/01/19 0328  Weight: 117.9 kg 120.4 kg    Exam:   General:  Obese no acute distress awake alert  Cardiovascular: bradycardia but regular, no mgr trace LE edema  Respiratory: normal effort  BS clear bilaterally no wheeze  Abdomen: obese soft +BS no guarding or rebounding  Musculoskeletal: joints without swelling/erythema   Data Reviewed: Basic Metabolic  Panel: Recent Labs  Lab 03/31/19 1330 03/31/19 1723 04/01/19 0511  NA 130* 130* 132*  K 6.5* 4.7 4.8  CL 99 100 101  CO2 18* 17* 20*  GLUCOSE 112* 109* 121*  BUN 113* 113* 118*  CREATININE 4.57* 4.39* 4.70*  CALCIUM 8.9 8.9 8.9   Liver Function Tests: Recent Labs  Lab 03/31/19 1330  AST 47*  ALT 51*  ALKPHOS 71  BILITOT 1.1  PROT 6.1*  ALBUMIN 3.4*   No results for input(s): LIPASE, AMYLASE in the last 168 hours. No results for input(s): AMMONIA in the last 168 hours. CBC: Recent Labs  Lab 03/31/19 1330 04/01/19 0511  WBC 8.9 8.1  NEUTROABS 6.3  --   HGB 11.5* 11.0*  HCT 36.5* 34.2*  MCV 85.9 83.2  PLT 181 171   Cardiac Enzymes: No results for input(s): CKTOTAL, CKMB, CKMBINDEX, TROPONINI in the last 168 hours. BNP (last 3 results) No results for input(s): BNP in the last 8760 hours.  ProBNP (last 3 results) No results for input(s): PROBNP in the last 8760 hours.  CBG: Recent Labs  Lab 03/31/19 1456 03/31/19 1620 03/31/19 2214 04/01/19 0618  GLUCAP 109* 119* 165* 116*    Recent Results (from the past 240 hour(s))  SARS Coronavirus 2 (CEPHEID - Performed in [place] hospital lab), Hosp Order     Status: None   Collection Time: 03/31/19  1:57 PM   Specimen: Nasopharyngeal Swab  Result Value Ref Range Status   SARS Coronavirus 2 NEGATIVE NEGATIVE Final    Comment: (NOTE) If result is NEGATIVE SARS-CoV-2 target nucleic acids are NOT DETECTED. The SARS-CoV-2 RNA is generally detectable in upper and lower  respiratory specimens during the acute phase of infection. The lowest  concentration of SARS-CoV-2 viral copies this assay can detect is 250  copies / mL. A negative result does not preclude SARS-CoV-2 infection  and should not be used as the sole basis for treatment or other  patient management decisions.  A negative result may occur with  improper specimen collection / handling, submission of specimen other  than nasopharyngeal swab, presence  of viral mutation(s) within the  areas targeted by this assay, and inadequate number of viral copies  (<250 copies / mL). A negative result must be combined with clinical  observations, patient history, and epidemiological information. If result is POSITIVE SARS-CoV-2 target nucleic acids are DETECTED. The SARS-CoV-2 RNA is generally detectable in upper and lower  respiratory specimens dur ing the acute phase of infection.  Positive  results are indicative of active infection with SARS-CoV-2.  Clinical  correlation with patient history and other diagnostic information is  necessary to determine patient infection status.  Positive results do  not rule out bacterial infection or co-infection with other viruses. If result is PRESUMPTIVE POSTIVE SARS-CoV-2 nucleic acids MAY BE PRESENT.   A presumptive positive result was obtained on the submitted specimen  and confirmed on repeat testing.  While 2019 novel coronavirus  (SARS-CoV-2) nucleic acids may be present in the submitted sample  additional confirmatory testing may be necessary for epidemiological  and / or clinical management purposes  to differentiate between  SARS-CoV-2 and other Sarbecovirus currently known to infect humans.  If clinically indicated additional testing with an alternate test  methodology [phone]) is advised. The SARS-CoV-2 RNA is generally  detectable in upper  and lower respiratory sp ecimens during the acute  phase of infection. The expected result is Negative. Fact Sheet for Patients:  StrictlyIdeas.no Fact Sheet for Healthcare Providers: BankingDealers.co.za This test is not yet approved or cleared by the [place] FDA and has been authorized for detection and/or diagnosis of SARS-CoV-2 by FDA under an Emergency Use Authorization (EUA).  This EUA will remain in effect (meaning this test can be used) for the duration of the COVID-19 declaration under Section  564(b)(1) of the Act, 21 U.S.C. section 360bbb-3(b)(1), unless the authorization is terminated or revoked sooner. Performed at [facility], [place] [address] [place], [place] [address]   MRSA PCR Screening     Status: None   Collection Time: 04/01/19  7:00 AM   Specimen: Nasopharyngeal  Result Value Ref Range Status   MRSA by PCR NEGATIVE NEGATIVE Final    Comment:        The GeneXpert MRSA Assay (FDA approved for NASAL specimens only), is one component of a comprehensive MRSA colonization surveillance program. It is not intended to diagnose MRSA infection nor to guide or monitor treatment for MRSA infections. Performed at [facility], [place] [address] [place], [place] [address]      Studies: US Renal  Result Date: 04/01/2019 CLINICAL DATA:  Acute kidney injury. EXAM: RENAL / URINARY TRACT ULTRASOUND COMPLETE COMPARISON:  None. FINDINGS: Right Kidney: Pelvic kidney located in the right lower quadrant. Renal measurements: 13 x 4.8 x 4.5 cm = volume: 145 mL. Renal collecting system is duplicated. Echogenicity within normal limits. Two subcentimeter cysts. No solid mass or hydronephrosis visualized. Left Kidney: Not visualized in the renal fossa or abdomen. Bladder: Appears normal for degree of bladder distention. IMPRESSION: 1. 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 2. No evidence of renal obstruction.  Two subcentimeter renal cysts. Electronically Signed   By: Keith Rake M.D.   On: 04/01/2019 02:39   Dg Chest Port 1 View  Result Date: 03/31/2019 CLINICAL DATA:  Chest pain and shortness of breath EXAM: PORTABLE CHEST 1 VIEW COMPARISON:  Portable exam 1334 hours without priors for comparison FINDINGS: Enlargement of cardiac silhouette with pulmonary vascular congestion. Atherosclerotic calcification aorta. Hazy LEFT basilar infiltrate. Remaining lungs clear. No pleural  effusion or pneumothorax. Osseous structures unremarkable. IMPRESSION: Enlargement of cardiac silhouette with pulmonary vascular congestion. Hazy LEFT basilar infiltrate question pneumonia. Electronically Signed   By: Lavonia Dana M.D.   On: 03/31/2019 13:45    Scheduled Meds: . allopurinol  100 mg Oral BID  . amLODipine  10 mg Oral QHS  . aspirin EC  81 mg Oral Daily  . atorvastatin  40 mg Oral QHS  . calcitRIOL  0.25 mcg Oral Daily  . doxazosin  8 mg Oral QHS  . fluticasone  2 spray Each Nare Daily  . heparin  5,000 Units Subcutaneous Q8H  . insulin aspart  0-15 Units Subcutaneous TID WC  . insulin aspart  0-5 Units Subcutaneous QHS  . levothyroxine  75 mcg Oral QAC breakfast  . montelukast  10 mg Oral Daily  . sodium chloride flush  3 mL Intravenous Q12H   Continuous Infusions:  Principal Problem:   Bradycardia Active Problems:   Hyperkalemia   Essential hypertension   CKD stage 4 due to type 2 diabetes mellitus (HCC)   Horseshoe kidney   Sleep apnea   History of anemia due to CKD  Hypothyroidism    Time spent: 22 minutes    Radene Gunning NP  [facility]  If 7PM-7AM, please contact night-coverage at [email or web address], password [email or web address] 04/01/2019, 10:05 AM  LOS: 0 days

## 2019-04-01 NOTE — Consult Note (Addendum)
Cardiology Consultation:   Patient ID: David Nguyen MRN: [id number]; DOB: Mar 08, 1941  Admit date: 03/31/2019 Date of Consult: 04/01/2019  Primary Care Provider: [name], MD Primary Cardiologist: New to Dr. [name]   Patient Profile:   David Nguyen is a 78 y.o. male with a hx of hypertension, hyperlipidemia, OSA on CPAP, diabetes and chronic kidney disease stage V (not on dialysis) who is being seen today for the evaluation of bradycardia at the request of Dr. Eliseo Squires.  Patient denies prior cardiac history.  No tobacco smoking or illicit drug use.  History of Present Illness:   David Nguyen presented with 3 days history of progressive worsening weakness and fatigue.  No associated dizziness, chest pain, shortness of breath or diaphoresis.  No syncope.  He was found bradycardic in 40s.  He was also noted hyperkalemic and hyponatremic.  Patient was seen by nephrologist and felt his hyperkalemia likely due to low GFR and lisinopril was held.  His Coreg 12.5 mg and Lasix 80 mg daily also held.  He continue to have slow heart rate in 40s.  Creatinine 4.7 this morning.  Potassium improved to 4.8.  Hemoglobin A1c 7.  TSH normal.  COVID negative. Last dose of Coreg AM of 6/22.  Renal ultrasound showed right solitary kidney.  No hydronephrosis.  Patient states that he has both kidneys.   Echo 04/01/2019  1. The left ventricle has hyperdynamic systolic function, with an ejection fraction of >65%. The cavity size was decreased. There is moderate concentric left ventricular hypertrophy. Left ventricular diastolic Doppler parameters are consistent with  restrictive filling. Elevated left atrial and left ventricular end-diastolic pressures.  2. The right ventricle has normal systolic function. The cavity was normal. There is no increase in right ventricular wall thickness. Right ventricular systolic pressure moderate to severely elevated.  3. Left atrial size was severely dilated.  4. Right atrial  size was moderately dilated.  5. Small pericardial effusion.  6. The pericardial effusion is posterior to the left ventricle.  7. Mild thickening of the mitral valve leaflet. There is moderate mitral annular calcification present. No evidence of mitral valve stenosis.  8. The aortic valve is tricuspid. Mild calcification of the aortic valve. Aortic valve regurgitation is trivial by color flow Doppler. Mild stenosis of the aortic valve.  9. The aortic arch and aortic root are normal in size and structure. 10. The inferior vena cava was dilated in size with <50% respiratory variability. 11. There is right bowing of the interatrial septum, suggestive of elevated left atrial pressure.  SUMMARY   Hyperdynamic LV, with at least moderate LVH and grade 3 diastolic dysfunction. Biatrial enlargement, elevated LAP and LVEDP. Moderate to severe pulmonary hypertension. Small pericardial effusion without echo evidence of tamponade, best seen posterior to LV.  Heart Pathway Score:     Past Medical History:  Diagnosis Date  . Allergic rhinitis   . Anemia    low iron  . Chronic kidney disease    Followed by Dr Deterding    . Diabetes mellitus    Type II  . Horseshoe kidney   . Hyperlipidemia   . Hypertension   . Hypothyroidism   . Sleep apnea    uses a cpap    Past Surgical History:  Procedure Laterality Date  . ADRENALECTOMY  1993   left  . AV FISTULA PLACEMENT Left 05/06/2018   Procedure: ARTERIOVENOUS (AV) FISTULA CREATION RADIOCEPHALIC;  Surgeon: [name], MD;  Location: [place];  Service: Vascular;  Laterality: Left;  . COLONOSCOPY W/ POLYPECTOMY    . REVISION OF ARTERIOVENOUS GORETEX GRAFT Left 07/11/2018   Procedure: TRANSPOSITION OF RADIOCEPHALIC  ARTERIOVENOUS FISTULA LEFT ARM;  Surgeon: [name], MD;  Location: [facility];  Service: Vascular;  Laterality: Left;     Inpatient Medications: Scheduled Meds: . allopurinol  100 mg Oral BID  . amLODipine  10 mg Oral QHS  .  aspirin EC  81 mg Oral Daily  . atorvastatin  40 mg Oral QHS  . calcitRIOL  0.25 mcg Oral Daily  . doxazosin  8 mg Oral QHS  . fluticasone  2 spray Each Nare Daily  . heparin  5,000 Units Subcutaneous Q8H  . insulin aspart  0-15 Units Subcutaneous TID WC  . insulin aspart  0-5 Units Subcutaneous QHS  . levothyroxine  75 mcg Oral QAC breakfast  . montelukast  10 mg Oral Daily  . sodium chloride flush  3 mL Intravenous Q12H   Continuous Infusions:  PRN Meds: acetaminophen **OR** acetaminophen, albuterol, ondansetron **OR** ondansetron (ZOFRAN) IV  Allergies:    Allergies  Allergen Reactions  . Penicillins Other (See Comments)    UNSPECIFIED REACTION  Has patient had a PCN reaction causing immediate rash, facial/tongue/throat swelling, SOB or lightheadedness with hypotension: Unknown Has patient had a PCN reaction causing severe rash involving mucus membranes or skin necrosis: Unknown Has patient had a PCN reaction that required hospitalization: Unknown Has patient had a PCN reaction occurring within the last 10 years: No If all of the above answers are "NO", then may proceed with Cephalosporin use.     Social History:   Social History   Socioeconomic History  . Marital status: Divorced    Spouse name: Not on file  . Number of children: 5  . Years of education: Not on file  . Highest education level: Not on file  Occupational History    Comment: [employer]  Social Needs  . Financial resource strain: Not on file  . Food insecurity    Worry: Not on file    Inability: Not on file  . Transportation needs    Medical: Not on file    Non-medical: Not on file  Tobacco Use  . Smoking status: Former Smoker    Packs/day: 0.30    Years: 3.00    Pack years: 0.90    Types: Cigarettes    Quit date: 10/09/1968    Years since quitting: 50.5  . Smokeless tobacco: Never Used  Substance and Sexual Activity  . Alcohol use: No  . Drug use: No  . Sexual activity: Not on file   Lifestyle  . Physical activity    Days per week: Not on file    Minutes per session: Not on file  . Stress: Not on file  Relationships  . Social Herbalist on phone: Not on file    Gets together: Not on file    Attends religious service: Not on file    Active member of club or organization: Not on file    Attends meetings of clubs or organizations: Not on file    Relationship status: Not on file  . Intimate partner violence    Fear of current or ex partner: Not on file    Emotionally abused: Not on file    Physically abused: Not on file    Forced sexual activity: Not on file  Other Topics Concern  . Not on file  Social History Narrative  . Not  on file    Family History:   Family History  Problem Relation Age of Onset  . Prostate cancer Father   . Kidney failure Mother   . Lung cancer Sister      ROS:  Please see the history of present illness. All other ROS reviewed and negative.     Physical Exam/Data:   Vitals:   Apr 20, 2019 0328 04/20/2019 0735 2019/04/20 0915 2019-04-20 1131  BP: (!) 128/33 (!) 132/49 (!) 137/40 (!) 132/35  Pulse: (!) 43 (!) 43 (!) 45 71  Resp: 18 20 16 17   Temp: 98.4 F (36.9 C) 98.8 F (37.1 C) 98 F (36.7 C) 98.2 F (36.8 C)  TempSrc: Oral Oral Oral Oral  SpO2: 100% 97%  95%  Weight: 120.4 kg     Height:        Intake/Output Summary (Last 24 hours) at 04-20-2019 1338 Last data filed at 20-Apr-2019 1305 Gross per 24 hour  Intake 772 ml  Output 950 ml  Net -178 ml   Last 3 Weights 04/20/19 03/31/2019 10/03/2018  Weight (lbs) 265 lb 8 oz 260 lb 269 lb  Weight (kg) 120.43 kg 117.935 kg 122.018 kg     Body mass index is 36.01 kg/m.  General:  Well nourished, well developed, in no acute distress HEENT: normal Lymph: no adenopathy Neck: no JVD Endocrine:  No thryomegaly Vascular: No carotid bruits; FA pulses 2+ bilaterally without bruits  Cardiac:  normal S1, S2; RRR; no murmur  Lungs:  clear to auscultation bilaterally, no  wheezing, rhonchi or rales  Abd: soft, nontender, no hepatomegaly  Ext: Puffy left lower extremity Musculoskeletal:  No deformities, BUE and BLE strength normal and equal Skin: warm and dry  Neuro:  CNs 2-12 intact, no focal abnormalities noted Psych:  Normal affect   EKG:  The EKG was personally reviewed and demonstrates: Junctional rhythm at rate of 43 bpm, right bundle branch block Telemetry:  Telemetry was personally reviewed and demonstrates: Cardia at rate of 40s  Relevant CV Studies: As above  Laboratory Data:  High Sensitivity Troponin:  No results for input(s): TROPONINIHS in the last 720 hours.   Cardiac EnzymesNo results for input(s): TROPONINI in the last 168 hours.  Recent Labs  Lab 03/31/19 1339  TROPIPOC 0.02    Chemistry Recent Labs  Lab 03/31/19 1330 03/31/19 1723 2019/04/20 0511  NA 130* 130* 132*  K 6.5* 4.7 4.8  CL 99 100 101  CO2 18* 17* 20*  GLUCOSE 112* 109* 121*  BUN 113* 113* 118*  CREATININE 4.57* 4.39* 4.70*  CALCIUM 8.9 8.9 8.9  GFRNONAA 11* 12* 11*  GFRAA 13* 14* 13*  ANIONGAP 13 13 11     Recent Labs  Lab 03/31/19 1330  PROT 6.1*  ALBUMIN 3.4*  AST 47*  ALT 51*  ALKPHOS 71  BILITOT 1.1   Hematology Recent Labs  Lab 03/31/19 1330 04-20-19 0511  WBC 8.9 8.1  RBC 4.25 4.11*  HGB 11.5* 11.0*  HCT 36.5* 34.2*  MCV 85.9 83.2  MCH 27.1 26.8  MCHC 31.5 32.2  RDW 17.5* 16.8*  PLT 181 171   BNPNo results for input(s): BNP, PROBNP in the last 168 hours.  DDimer No results for input(s): DDIMER in the last 168 hours.   Radiology/Studies:  US Renal  Result Date: 2019/04/20 CLINICAL DATA:  Acute kidney injury. EXAM: RENAL / URINARY TRACT ULTRASOUND COMPLETE COMPARISON:  None. FINDINGS: Right Kidney: Pelvic kidney located in the right lower quadrant. Renal measurements: 13 x 4.8  x 4.5 cm = volume: 145 mL. Renal collecting system is duplicated. Echogenicity within normal limits. Two subcentimeter cysts. No solid mass or hydronephrosis  visualized. Left Kidney: Not visualized in the renal fossa or abdomen. Bladder: Appears normal for degree of bladder distention. IMPRESSION: 1. 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 2. No evidence of renal obstruction.  Two subcentimeter renal cysts. Electronically Signed   By: Keith Rake M.D.   On: 04/01/2019 02:39   Dg Chest Port 1 View  Result Date: 03/31/2019 CLINICAL DATA:  Chest pain and shortness of breath EXAM: PORTABLE CHEST 1 VIEW COMPARISON:  Portable exam 1334 hours without priors for comparison FINDINGS: Enlargement of cardiac silhouette with pulmonary vascular congestion. Atherosclerotic calcification aorta. Hazy LEFT basilar infiltrate. Remaining lungs clear. No pleural effusion or pneumothorax. Osseous structures unremarkable. IMPRESSION: Enlargement of cardiac silhouette with pulmonary vascular congestion. Hazy LEFT basilar infiltrate question pneumonia. Electronically Signed   By: Lavonia Dana M.D.   On: 03/31/2019 13:45    Assessment and Plan:   1. Symptomatic bradycardia/ Junctional bradycardia -Presented with 3 days history of progressive worsening weakness and fatigue. -Heart rate persisted in 40s despite holding Coreg for > 24 hours.  Last dose a.m. of 6/22. -No syncope.  TSH normal. -Echocardiogram with hyperdynamic LV function, moderate LVH, grade 3 diastolic dysfunction with elevated LVEDP.  Moderate to severe pulmonary hypertension - Continue to hold Coreg.  - Will have EP to see  2.  Chronic kidney disease stage V -Nephrology.  Patient has fistula.  No dialysis yet.  3.  Chronic diastolic dysfunction -Grade 3 by echocardiogram with elevated LAP and LVEDP.  4.  Moderate to severe pulmonary hypertension / OSA -Patient reports compliance with CPAP  5. Hyperkalemia - Resolved with holding Lisinopril  For questions or updates, please contact [place]  Please consult [email or web address] for contact info under     Jarrett Soho, PA  04/01/2019 1:38 PM   Agree with note by Robbie Lis PA-C  We are asked to see David Nguyen because of symptomatic bradycardia.  He is an 78 year old gentleman with CKD the 5 predialysis without indwelling fistula that was placed earlier this year.  Does have hypertension as well.  He was on Coreg and lisinopril.  He has been feeling weak for the last several weeks.  His lisinopril was discontinued because of mild hyperkalemia and his carvedilol was discontinued yesterday as well.  Today his heart rates in the 40s with right bundle branch block which is old.  His EF is normal with diastolic dysfunction and moderate pulmonary pretension.  His exam is benign.  We will have EP evaluate.  He may require permanent transvenous pacemaker insertion if his intrinsic heart rate does not improve over the next 24 hours after beta-blocker washout.  David Nguyen, M.D., [place], [facility], [place] [address] [place], [place]  [address]  [phone] 04/01/2019 2:13 PM

## 2019-04-01 NOTE — Progress Notes (Signed)
  Echocardiogram 2D Echocardiogram has been performed.  David Nguyen 04/01/2019, 8:46 AM

## 2019-04-01 NOTE — Progress Notes (Signed)
[facility] Progress Note  Name: David Nguyen MRN: [id number] DOB: 06/12/1941  Chief Complaint:  Weakness   Subjective:  still with persistent bradycardia despite holding beta blocker and hyperkalemia resolved.  Had 1 liter UOP over 6/22. He has a LUE AVF in place and would want HD if needed but is hoping that it hasn't come to that.   Review of systems:  Denies shortness of breath  Denies n/v Bradycardia +  ------------------------- Background on consult:  78 year old male presented to the emergency department after recommendation by PMD for the evaluation of several days of weakness, found to have mild bradycardia with heart rate in the 40s.    PMH Incudes:  Hypertension: Medications include lisinopril 40 mg daily, carvedilol 12.5 mg twice daily, doxazosin 8 mg nightly, Lasix 80 mg daily, amlodipine 10 mg daily  Hyperlipidemia  DM 2  OSA  Hypothyroidism\  BPH  CKD 5: Followed by Deterding --> Bhandari, last seen 04/20220; has L mature RC AVF; anemia on retacrit 30,000 units every 3 weeks; secondary hyperparathyroidism on calcitriol.  Presumed diabetic and hypertensive etiology.  Baseline creatinine is 3.9 based upon values from June of last year in January of this year.  Patient denies use of nonsteroidals.  Denies worsened polyuria, incontinence, hesitancy, nocturia, suprapubic tenderness.  Continue to take his lisinopril throughout.  No significant diarrhea/nausea/vomiting.  No dysgeusia, anorexia, peripheral edema, confusion.   In work-up in the emergency room heart rate was in the 40s.  EKG is junctional rhythm.  Potassium 6.5 with serum bicarbonate of 18, anion gap of 13.  BUN 113 and serum creatinine 4.57; 4 hours later potassium had normalized to 4.7 and bicarbonate was 17.  In ER patient was treated with insulin/dextrose, 1 amp of sodium bicarbonate, 8.4 g of Veltassa.   Intake/Output Summary (Last 24 hours) at 04/01/2019 1749 Last data filed at  04/01/2019 1736 Gross per 24 hour  Intake 883 ml  Output 1250 ml  Net -367 ml    Vitals:  Vitals:   04/01/19 0735 04/01/19 0915 04/01/19 1131 04/01/19 1630  BP: (!) 132/49 (!) 137/40 (!) 132/35 (!) 144/37  Pulse: (!) 43 (!) 45 71 (!) 49  Resp: 20 16 17 20   Temp: 98.8 F (37.1 C) 98 F (36.7 C) 98.2 F (36.8 C) 98.4 F (36.9 C)  TempSrc: Oral Oral Oral Oral  SpO2: 97%  95% 94%  Weight:      Height:         Physical Exam:  General adult male in bed in no acute distress HEENT normocephalic atraumatic extraocular movements intact sclera anicteric Neck supple trachea midline Lungs clear to auscultation bilaterally normal work of breathing at rest  Heart bradycardia  Abdomen soft nontender nondistended Extremities no edema  Psych normal mood and affect Access: LUE AVF with bruit and thrill   Medications reviewed   Labs:  BMP Latest Ref Rng & Units 04/01/2019 03/31/2019 03/31/2019  Glucose 70 - 99 mg/dL 121(H) 109(H) 112(H)  BUN 8 - 23 mg/dL 118(H) 113(H) 113(H)  Creatinine 0.61 - 1.24 mg/dL 4.70(H) 4.39(H) 4.57(H)  Sodium 135 - 145 mmol/L 132(L) 130(L) 130(L)  Potassium 3.5 - 5.1 mmol/L 4.8 4.7 6.5(HH)  Chloride 98 - 111 mmol/L 101 100 99  CO2 22 - 32 mmol/L 20(L) 17(L) 18(L)  Calcium 8.9 - 10.3 mg/dL 8.9 8.9 8.9     Assessment/Plan:   78 year old male with CKD 5, presenting with weakness and found to have bradycardia with junctional rhythm on EKG,  hyperkalemia at 6.5.  1. Hyperkalemia: Likely due to low GFR, full dose ACE inhibitor, ? Diet; improved to 4.7 on recheck.  Hold lisinopril.  2. CKD stage 5 - renal US without obstruction.  Solitary kidney right pelvis.  Holding lasix as well.  Holding lisinopril.  May have progressed to ESRD and does have LUE AVF.  Will give normal saline for 1 liter total and reassess  3. Junctional bradycardia: Per admitting team: Beta-blocker on hold.  Cardiology has evaluated and plans for EP to see.  Note hyperkalemia resolved  4. Mild  metabolic acidosis with anion gap of 13, likely related to renal failure.  Improving 5. Anemia, on ESA as outpatient, at goal  6. Left forearm radiocephalic AV fistula: Stable and mature 7. Secondary hyperparathyroidism on calcitriol     Claudia Desanctis, MD 04/01/2019 5:49 PM

## 2019-04-01 NOTE — Plan of Care (Signed)
  Problem: Education: Goal: Knowledge of General Education information will improve Description Including pain rating scale, medication(s)/side effects and non-pharmacologic comfort measures Outcome: Progressing   

## 2019-04-02 DIAGNOSIS — E118 Type 2 diabetes mellitus with unspecified complications: Secondary | ICD-10-CM

## 2019-04-02 LAB — BASIC METABOLIC PANEL
Anion gap: 9 (ref 5–15)
BUN: 120 mg/dL — ABNORMAL HIGH (ref 8–23)
CO2: 21 mmol/L — ABNORMAL LOW (ref 22–32)
Calcium: 9.1 mg/dL (ref 8.9–10.3)
Chloride: 106 mmol/L (ref 98–111)
Creatinine, Ser: 4.59 mg/dL — ABNORMAL HIGH (ref 0.61–1.24)
GFR calc Af Amer: 13 mL/min — ABNORMAL LOW (ref >60)
GFR calc non Af Amer: 11 mL/min — ABNORMAL LOW (ref >60)
Glucose, Bld: 152 mg/dL — ABNORMAL HIGH (ref 70–99)
Potassium: 4.9 mmol/L (ref 3.5–5.1)
Sodium: 136 mmol/L (ref 135–145)

## 2019-04-02 LAB — GLUCOSE, CAPILLARY
Glucose-Capillary: 155 mg/dL — ABNORMAL HIGH (ref 70–99)
Glucose-Capillary: 134 mg/dL — ABNORMAL HIGH (ref 70–99)
Glucose-Capillary: 131 mg/dL — ABNORMAL HIGH (ref 70–99)
Glucose-Capillary: 161 mg/dL — ABNORMAL HIGH (ref 70–99)

## 2019-04-02 MED ORDER — SODIUM CHLORIDE 0.9 % IV SOLN
INTRAVENOUS | Status: AC
  Administered 2019-04-02: 13:00:00 via INTRAVENOUS

## 2019-04-02 MED ORDER — HYDRALAZINE HCL 20 MG/ML IJ SOLN: 5.0000 mg | Freq: Four times a day (QID) | INTRAMUSCULAR | Status: DC | PRN

## 2019-04-02 NOTE — Consult Note (Addendum)
ELECTROPHYSIOLOGY CONSULT NOTE    Patient ID: David Nguyen MRN: [id number], DOB/AGE: 02-26-1941 78 y.o.  Admit date: 03/31/2019 Date of Consult: 04/02/2019  Primary Physician: [name], MD Primary Cardiologist: New to Dr. [name] Electrophysiologist: New to Dr. Curt Bears  Patient Profile: David Nguyen is a 78 y.o. male with a history of HTN, HLD, OSA on CPAP, DM2, and CKD V (Not on HD who is being seen today for the evaluation of symptomatic bradycardia at the request of Dr. Eliseo Squires.  HPI:  David Nguyen is a 78 y.o. male presented with 3 days of worsening weakness and fatigue. No associated dizziness, CP, shortness of breath, or diaphoresis. Denies syncope or near syncope. EKG with bradycardia in the 40s. Coreg held. Hyperkalemia noted in setting of low GFR. Lisinopril and lasix also held. EF is normal.   He is feeling better this am. He denies syncope, near syncope, dizziness, or lightheadedness prior to the past 3 days.   Past Medical History:  Diagnosis Date  . Allergic rhinitis   . Anemia    low iron  . Bradycardia 03/2019  . Chronic kidney disease    Followed by Dr Deterding    . Diabetes mellitus    Type II  . Horseshoe kidney   . Hyperlipidemia   . Hypertension   . Hypothyroidism   . Sleep apnea    uses a cpap     Surgical History:  Past Surgical History:  Procedure Laterality Date  . ADRENALECTOMY  1993   left  . AV FISTULA PLACEMENT Left 05/06/2018   Procedure: ARTERIOVENOUS (AV) FISTULA CREATION RADIOCEPHALIC;  Surgeon: [name], MD;  Location: [facility];  Service: Vascular;  Laterality: Left;  . COLONOSCOPY W/ POLYPECTOMY    . REVISION OF ARTERIOVENOUS GORETEX GRAFT Left 07/11/2018   Procedure: TRANSPOSITION OF RADIOCEPHALIC  ARTERIOVENOUS FISTULA LEFT ARM;  Surgeon: [name], MD;  Location: [facility];  Service: Vascular;  Laterality: Left;     Medications Prior to Admission  Medication Sig Dispense Refill Last Dose  . allopurinol (ZYLOPRIM) 100  MG tablet Take 100 mg by mouth 2 (two) times daily.    03/31/2019 at am  . amLODipine (NORVASC) 10 MG tablet Take 10 mg by mouth at bedtime.    03/30/2019 at pm  . aspirin EC 81 MG tablet Take 81 mg by mouth daily.   03/31/2019 at am  . atorvastatin (LIPITOR) 40 MG tablet Take 40 mg by mouth at bedtime.    03/30/2019 at pm  . calcitRIOL (ROCALTROL) 0.25 MCG capsule Take 0.25 mcg by mouth daily.    03/31/2019 at am  . carvedilol (COREG) 25 MG tablet Take 12.5 mg by mouth 2 (two) times daily with a meal.    03/31/2019 at 630  . doxazosin (CARDURA) 8 MG tablet Take 8 mg by mouth at bedtime.     03/30/2019 at pm  . fluticasone (FLONASE) 50 MCG/ACT nasal spray Place 2 sprays into both nostrils daily.   11 03/31/2019 at am  . furosemide (LASIX) 80 MG tablet Take by mouth daily.    03/31/2019 at am  . Homeopathic Products [facility] ALLERGY EYE RELIEF) SOLN Place 1-2 drops into both eyes daily as needed (for dry eyes).   03/30/2019 at Unknown time  . Insulin Isophane & Regular Human (HUMULIN 70/30 MIX) (70-30) 100 UNIT/ML PEN Inject 16-20 Units into the skin See admin instructions. Inject 20 units subcutaneously before breakfast and inject 16 units subcutaneously before supper  03/31/2019 at am  . levothyroxine (SYNTHROID, LEVOTHROID) 75 MCG tablet Take 75 mcg by mouth daily before breakfast.   03/31/2019 at am  . lisinopril (PRINIVIL,ZESTRIL) 40 MG tablet Take 40 mg by mouth at bedtime.    03/30/2019 at pm  . montelukast (SINGULAIR) 10 MG tablet Take 10 mg by mouth daily.    03/31/2019 at am  . Multiple Vitamin (MULTIVITAMIN WITH MINERALS) TABS tablet Take 1 tablet by mouth daily.   03/31/2019 at am  . PRESCRIPTION MEDICATION Inhale into the lungs at bedtime. CPAP   03/30/2019 at pm  . testosterone cypionate (DEPOTESTOSTERONE CYPIONATE) 200 MG/ML injection Inject 200 mg into the muscle every 21 ( twenty-one) days.   March 2020  . oxyCODONE-acetaminophen (PERCOCET/ROXICET) 5-325 MG tablet Take 1 tablet by mouth every 6  (six) hours as needed. (Patient not taking: Reported on 03/31/2019) 12 tablet 0 Not Taking at Unknown time    Inpatient Medications:  . allopurinol  100 mg Oral BID  . amLODipine  10 mg Oral QHS  . aspirin EC  81 mg Oral Daily  . atorvastatin  40 mg Oral QHS  . calcitRIOL  0.25 mcg Oral Daily  . doxazosin  8 mg Oral QHS  . fluticasone  2 spray Each Nare Daily  . heparin  5,000 Units Subcutaneous Q8H  . insulin aspart  0-15 Units Subcutaneous TID WC  . insulin aspart  0-5 Units Subcutaneous QHS  . levothyroxine  75 mcg Oral QAC breakfast  . montelukast  10 mg Oral Daily  . sodium chloride flush  3 mL Intravenous Q12H    Allergies:  Allergies  Allergen Reactions  . Penicillins Other (See Comments)    UNSPECIFIED REACTION  Has patient had a PCN reaction causing immediate rash, facial/tongue/throat swelling, SOB or lightheadedness with hypotension: Unknown Has patient had a PCN reaction causing severe rash involving mucus membranes or skin necrosis: Unknown Has patient had a PCN reaction that required hospitalization: Unknown Has patient had a PCN reaction occurring within the last 10 years: No If all of the above answers are "NO", then may proceed with Cephalosporin use.     Social History   Socioeconomic History  . Marital status: Divorced    Spouse name: Not on file  . Number of children: 5  . Years of education: Not on file  . Highest education level: Not on file  Occupational History    Comment: [employer]  Social Needs  . Financial resource strain: Not on file  . Food insecurity    Worry: Not on file    Inability: Not on file  . Transportation needs    Medical: Not on file    Non-medical: Not on file  Tobacco Use  . Smoking status: Former Smoker    Packs/day: 0.30    Years: 3.00    Pack years: 0.90    Types: Cigarettes    Quit date: 10/09/1968    Years since quitting: 50.5  . Smokeless tobacco: Never Used  Substance and Sexual Activity  . Alcohol use: No  .  Drug use: No  . Sexual activity: Not on file  Lifestyle  . Physical activity    Days per week: Not on file    Minutes per session: Not on file  . Stress: Not on file  Relationships  . Social Herbalist on phone: Not on file    Gets together: Not on file    Attends religious service: Not on file  Active member of club or organization: Not on file    Attends meetings of clubs or organizations: Not on file    Relationship status: Not on file  . Intimate partner violence    Fear of current or ex partner: Not on file    Emotionally abused: Not on file    Physically abused: Not on file    Forced sexual activity: Not on file  Other Topics Concern  . Not on file  Social History Narrative  . Not on file     Family History  Problem Relation Age of Onset  . Prostate cancer Father   . Kidney failure Mother   . Lung cancer Sister      Review of Systems: All other systems reviewed and are otherwise negative except as noted above.  Physical Exam: Vitals:   04/01/19 1131 04/01/19 1630 04/01/19 2001 04/02/19 0331  BP: (!) 132/35 (!) 144/37 (!) 177/48 (!) 152/56  Pulse: 71 (!) 49 (!) 54 93  Resp: 17 20 18 18   Temp: 98.2 F (36.8 C) 98.4 F (36.9 C) 98.1 F (36.7 C) 99 F (37.2 C)  TempSrc: Oral Oral Oral Oral  SpO2: 95% 94% 96% 96%  Weight:    118 kg  Height:        GEN- The patient is elderly appearing, alert and oriented x 3 today.   HEENT: normocephalic, atraumatic; sclera clear, conjunctiva pink; hearing intact; oropharynx clear; neck supple Lungs- Clear to ausculation bilaterally, normal work of breathing.  No wheezes, rales, rhonchi Heart- Regular rate and rhythm, no murmurs, rubs or gallops GI- soft, non-tender, non-distended, bowel sounds present Extremities- no clubbing, cyanosis, or edema; DP/PT/radial pulses 2+ bilaterally MS- no significant deformity or atrophy Skin- warm and dry, no rash or lesion Psych- euthymic mood, full affect Neuro- strength  and sensation are intact  Labs:   Lab Results  Component Value Date   WBC 8.1 04/01/2019   HGB 11.0 (L) 04/01/2019   HCT 34.2 (L) 04/01/2019   MCV 83.2 04/01/2019   PLT 171 04/01/2019    Recent Labs  Lab 03/31/19 1330  04/01/19 0511  NA 130*   < > 132*  K 6.5*   < > 4.8  CL 99   < > 101  CO2 18*   < > 20*  BUN 113*   < > 118*  CREATININE 4.57*   < > 4.70*  CALCIUM 8.9   < > 8.9  PROT 6.1*  --   --   BILITOT 1.1  --   --   ALKPHOS 71  --   --   ALT 51*  --   --   AST 47*  --   --   GLUCOSE 112*   < > 121*   < > = values in this interval not displayed.      Radiology/Studies: US Renal  Result Date: 04/01/2019 CLINICAL DATA:  Acute kidney injury. EXAM: RENAL / URINARY TRACT ULTRASOUND COMPLETE COMPARISON:  None. FINDINGS: Right Kidney: Pelvic kidney located in the right lower quadrant. Renal measurements: 13 x 4.8 x 4.5 cm = volume: 145 mL. Renal collecting system is duplicated. Echogenicity within normal limits. Two subcentimeter cysts. No solid mass or hydronephrosis visualized. Left Kidney: Not visualized in the renal fossa or abdomen. Bladder: Appears normal for degree of bladder distention. IMPRESSION: 1. 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 2. No evidence of renal obstruction.  Two subcentimeter renal cysts. Electronically Signed   By: Keith Rake M.D.   On: 04/01/2019 02:39   Dg Chest Port 1 View  Result Date: 03/31/2019 CLINICAL DATA:  Chest pain and shortness of breath EXAM: PORTABLE CHEST 1 VIEW COMPARISON:  Portable exam 1334 hours without priors for comparison FINDINGS: Enlargement of cardiac silhouette with pulmonary vascular congestion. Atherosclerotic calcification aorta. Hazy LEFT basilar infiltrate. Remaining lungs clear. No pleural effusion or pneumothorax. Osseous structures unremarkable. IMPRESSION: Enlargement of cardiac silhouette with pulmonary vascular  congestion. Hazy LEFT basilar infiltrate question pneumonia. Electronically Signed   By: Lavonia Dana M.D.   On: 03/31/2019 13:45    EKG: Junctional rhythm at 43 bpm, personally reviewed  TELEMETRY: Sinus brady in 50s overnight, and this am went back into sinus 80-90s with BB wash out. (personally reviewed)  Assessment/Plan: 1.  Symptomatic bradycardia - Pt now in NSR 80-90s s/p washout of coreg. Suspect Aamna Mallozzi eventually need pacemaker but no immediate indication. EP Jewell Haught come back and see if HR worsens. Please call with any questions.   For questions or updates, please contact [place] Please consult [email or web address] for contact info under Cardiology/STEMI.  Jacalyn Lefevre, PA-C  04/02/2019 6:38 AM  I have seen and examined this patient with Oda Kilts.  Agree with above, note added to reflect my findings.  On exam, RRR, no murmurs, lungs clear. Patient presetned with weakness and fatigue found to be in junctional rhythm. Coreg held and now in sinus. At this point, no reason for pacemaker implant. Would avoid all AV nodal blockers.    Martyna Thorns M. Daila Elbert MD 04/02/2019 8:08 AM

## 2019-04-02 NOTE — Progress Notes (Signed)
[facility] Progress Note  Name: David Nguyen MRN: [id number] DOB: August 05, 1941  Chief Complaint:  Weakness   Subjective:   Bradycardia is improving.  Had 1+ liter UOP over 6/23 with a couple of unmeasured voids as well.  He has a LUE AVF in place and would want HD if needed but is hoping that it hasn't come to that.  When I ask him about willingness to start dialysis as early as tomorrow if labs do not improve he would like to think about it.  Low appetite and we discussed that his low appetite was likely related to his CKD progression.   Review of systems:  Denies shortness of breath  Denies n/v No cp  ------------------------- Background on consult:  78 year old male presented to the emergency department after recommendation by PMD for the evaluation of several days of weakness, found to have mild bradycardia with heart rate in the 40s.    PMH Incudes:  Hypertension: Medications include lisinopril 40 mg daily, carvedilol 12.5 mg twice daily, doxazosin 8 mg nightly, Lasix 80 mg daily, amlodipine 10 mg daily  Hyperlipidemia  DM 2  OSA  Hypothyroidism\  BPH  CKD 5: Followed by Deterding --> Bhandari, last seen 04/20220; has L mature RC AVF; anemia on retacrit 30,000 units every 3 weeks; secondary hyperparathyroidism on calcitriol.  Presumed diabetic and hypertensive etiology.  Baseline creatinine is 3.9 based upon values from June of last year in January of this year.  Patient denies use of nonsteroidals.  Denies worsened polyuria, incontinence, hesitancy, nocturia, suprapubic tenderness.  Continue to take his lisinopril throughout.  No significant diarrhea/nausea/vomiting.  No dysgeusia, anorexia, peripheral edema, confusion.   In work-up in the emergency room heart rate was in the 40s.  EKG is junctional rhythm.  Potassium 6.5 with serum bicarbonate of 18, anion gap of 13.  BUN 113 and serum creatinine 4.57; 4 hours later potassium had normalized to 4.7 and  bicarbonate was 17.  In ER patient was treated with insulin/dextrose, 1 amp of sodium bicarbonate, 8.4 g of Veltassa.   Intake/Output Summary (Last 24 hours) at 04/02/2019 1218 Last data filed at 04/02/2019 0936 Gross per 24 hour  Intake 1105 ml  Output 975 ml  Net 130 ml    Vitals:  Vitals:   04/01/19 1630 04/01/19 2001 04/02/19 0331 04/02/19 1137  BP: (!) 144/37 (!) 177/48 (!) 152/56 (!) 152/42  Pulse: (!) 49 (!) 54 93 90  Resp: 20 18 18 20   Temp: 98.4 F (36.9 C) 98.1 F (36.7 C) 99 F (37.2 C) 98.4 F (36.9 C)  TempSrc: Oral Oral Oral Oral  SpO2: 94% 96% 96% 95%  Weight:   118 kg   Height:         Physical Exam:  General adult male in bed in no acute distress  HEENT normocephalic atraumatic extraocular movements intact sclera anicteric Neck supple trachea midline Lungs clear to auscultation bilaterally normal work of breathing at rest  Heart RRR Abdomen soft nontender nondistended Extremities no lower extremity edema  Psych normal mood and affect Access: LUE AVF with bruit and thrill   Medications reviewed   Labs:  BMP Latest Ref Rng & Units 04/02/2019 04/01/2019 03/31/2019  Glucose 70 - 99 mg/dL 152(H) 121(H) 109(H)  BUN 8 - 23 mg/dL 120(H) 118(H) 113(H)  Creatinine 0.61 - 1.24 mg/dL 4.59(H) 4.70(H) 4.39(H)  Sodium 135 - 145 mmol/L 136 132(L) 130(L)  Potassium 3.5 - 5.1 mmol/L 4.9 4.8 4.7  Chloride 98 - 111  mmol/L 106 101 100  CO2 22 - 32 mmol/L 21(L) 20(L) 17(L)  Calcium 8.9 - 10.3 mg/dL 9.1 8.9 8.9     Assessment/Plan:   78 year old male with CKD 5, presenting with weakness and found to have bradycardia with junctional rhythm on EKG, hyperkalemia at 6.5.  1. Hyperkalemia: secondary to CKD as well as ACE inhibitor  2. CKD stage 5 - last Cr 3.9 from 10/2018 - no other recent Cr.  Renal US without obstruction.  Solitary kidney right pelvis.  Holding lasix for now and holding lisinopril.  May have progressed to ESRD and does have LUE AVF.  I have recommended  starting HD tomorrow if his clearance does not improve and he wants to think about this.  May have some improvement with resolution of bradycardia.  Will also administer normal saline today - NS at 75/hr x 12 hours. 3. Junctional bradycardia: Per admitting team: Beta-blocker on hold.  EP has evaluated and no plans for pacemaker.  Note hyperkalemia resolved  4. Mild metabolic acidosis secondary to CKD.  Improving 5. Anemia, on ESA as outpatient, at goal  6. Left forearm radiocephalic AV fistula: Stable and mature 7. Secondary hyperparathyroidism on calcitriol   Claudia Desanctis, MD 04/02/2019 12:18 PM

## 2019-04-02 NOTE — Progress Notes (Signed)
PROGRESS NOTE    David Nguyen  JJO:[id number] DOB: Sep 22, 1941 DOA: 03/31/2019 PCP: [name], MD   Brief Narrative:  78 y.o. BM PMHx HTN, HLD, CKD stage IV/V, hypothyroidism, DM type II controlled with complication, OSA on CPAP, and iron deficiency anemia;   Presented with complaints of generalized weakness.  He complains of feeling generalized weakness and dizziness for the last several days.  He was seen by his primary care provider for further evaluation of symptoms and was found to be bradycardic into the 40s.  He is on medications of Coreg for treatment of high blood pressure which he takes as prescribed.  Associated symptoms include some leg swelling.  Denies having any focal weakness, fever, chest pain, loss of consciousness, falls, dysuria, or shortness of breath.  In outpatient setting patient reports that he is followed by Dr. Jimmy Footman of [facility].  He has a left forearm fistula in place, but is not on dialysis.  Patient manages his own medications.     Subjective: A/O x4, negative CP, negative abdominal pain, negative S OB.  Positive hard of hearing.   Assessment & Plan:   Principal Problem:   Bradycardia Active Problems:   Essential hypertension   Sleep apnea   CKD stage 4 due to type 2 diabetes mellitus (HCC)   History of anemia due to CKD   Hypothyroidism   Hyperkalemia   Horseshoe kidney   Bradycardia - Seen by EP Dr. Curt Bears - Per Dr. Curt Bears may require pacer in the future. - Coreg has been on hold and now patient's HR in the 90s. - Currently NSR  Chronic diastolic dysfunction/Pulmonary HTN -Strict in and out + 531ml -Daily weight Filed Weights   03/31/19 1317 04/01/19 0328 04/02/19 0331  Weight: 117.9 kg 120.4 kg 118 kg    Essential HTN -Amlodipine 10 mg nightly - Hold ACE/ARB secondary to renal function -Hold beta-blocker secondary to bradycardia - Hydralazine PRN  Hyperkalemia - Slightly elevated - Monitor daily;  currently asymptomatic - Most likely secondary to ACE inhibitor and end-stage renal disease  CKD stage V vs ESRD - LEFT upper extremity fistula but currently not on HD -Per nephrology note patient would want HD if required - HD on 6/25 if no improvement in renal status per nephrology   Diabetes type 2  controlled with complication - 1/60 hemoglobin A1c= 7 - Lipid panel pending -Patient on 70/30 Humulin 20 units every morning/16 units every afternoon at home.  Not requiring this coverage while hospitalized - Moderate SSI  Left basilar opacity - Patient asymptomatic, negative fever, negative leukocytosis will monitor for now    Anemia of chronic disease - Hemoglobin on admission 11.5 g/dL  Hypothyroidism -  Synthroid 75 mcg daily   Metabolic acidosis -Resolving   OSA on CPAP -CPAP per respiratory therapy     Obesity: BMI elevated 35. to 6 kg/m    DVT prophylaxis: Heparin Code Status: Full Family Communication: None Disposition Plan: TBD   Consultants:  Nephrology EP Cardiology   Procedures/Significant Events:  6/23 echocardiogram:Left Ventricle:-Hyperdynamic systolic function, with LVEF >65%.-Moderate concentric left ventricular hypertrophy.  -Left ventricular diastolic Doppler parameters are consistent with restrictive filling.  -Right ventricular systolic pressure moderate to severely elevated. Left Atrium: severely dilated. Right Atrium: moderately dilated. Interatrial Septum: right bowing of the interatrial septum, suggestive of elevated left atrial pressure.   I have personally reviewed and interpreted all radiology studies and my findings are as above.  VENTILATOR SETTINGS:    Cultures  Antimicrobials:    Devices    LINES / TUBES:      Continuous Infusions:  sodium chloride 75 mL/hr at 04/02/19 1315     Objective: Vitals:   04/01/19 1630 04/01/19 2001 04/02/19 0331 04/02/19 1137  BP: (!) 144/37 (!) 177/48 (!) 152/56 (!)  152/42  Pulse: (!) 49 (!) 54 93 90  Resp: 20 18 18 20   Temp: 98.4 F (36.9 C) 98.1 F (36.7 C) 99 F (37.2 C) 98.4 F (36.9 C)  TempSrc: Oral Oral Oral Oral  SpO2: 94% 96% 96% 95%  Weight:   118 kg   Height:        Intake/Output Summary (Last 24 hours) at 04/02/2019 1604 Last data filed at 04/02/2019 1411 Gross per 24 hour  Intake 1105 ml  Output 675 ml  Net 430 ml   Filed Weights   03/31/19 1317 04/01/19 0328 04/02/19 0331  Weight: 117.9 kg 120.4 kg 118 kg    Examination:  General: A/O x4, no acute respiratory distress Eyes: negative scleral hemorrhage, negative anisocoria, negative icterus ENT: Negative Runny nose, negative gingival bleeding, Neck:  Negative scars, masses, torticollis, lymphadenopathy, JVD Lungs: Clear to auscultation bilaterally without wheezes or crackles Cardiovascular: Regular rate and rhythm without murmur gallop or rub normal S1 and S2 Abdomen: negative abdominal pain, nondistended, positive soft, bowel sounds, no rebound, no ascites, no appreciable mass Extremities: No significant cyanosis, clubbing, or edema bilateral lower extremities Skin: Negative rashes, lesions, ulcers Psychiatric:  Negative depression, negative anxiety, negative fatigue, negative mania  Central nervous system:  Cranial nerves II through XII intact, tongue/uvula midline, all extremities muscle strength 5/5, sensation intact throughout, negative dysarthria, negative expressive aphasia, negative receptive aphasia.  .     Data Reviewed: Care during the described time interval was provided by me .  I have reviewed this patient's available data, including medical history, events of note, physical examination, and all test results as part of my evaluation.   CBC: Recent Labs  Lab 03/31/19 1330 04/01/19 0511  WBC 8.9 8.1  NEUTROABS 6.3  --   HGB 11.5* 11.0*  HCT 36.5* 34.2*  MCV 85.9 83.2  PLT 181 154   Basic Metabolic Panel: Recent Labs  Lab 03/31/19 1330  03/31/19 1723 04/01/19 0511 04/02/19 0639  NA 130* 130* 132* 136  K 6.5* 4.7 4.8 4.9  CL 99 100 101 106  CO2 18* 17* 20* 21*  GLUCOSE 112* 109* 121* 152*  BUN 113* 113* 118* 120*  CREATININE 4.57* 4.39* 4.70* 4.59*  CALCIUM 8.9 8.9 8.9 9.1   GFR: Estimated Creatinine Clearance: 17 mL/min (A) (by C-G formula based on SCr of 4.59 mg/dL (H)). Liver Function Tests: Recent Labs  Lab 03/31/19 1330  AST 47*  ALT 51*  ALKPHOS 71  BILITOT 1.1  PROT 6.1*  ALBUMIN 3.4*   No results for input(s): LIPASE, AMYLASE in the last 168 hours. No results for input(s): AMMONIA in the last 168 hours. Coagulation Profile: No results for input(s): INR, PROTIME in the last 168 hours. Cardiac Enzymes: No results for input(s): CKTOTAL, CKMB, CKMBINDEX, TROPONINI in the last 168 hours. BNP (last 3 results) No results for input(s): PROBNP in the last 8760 hours. HbA1C: Recent Labs    03/31/19 1930  HGBA1C 7.0*   CBG: Recent Labs  Lab 04/01/19 1132 04/01/19 1623 04/01/19 2112 04/02/19 0558 04/02/19 1139  GLUCAP 152* 107* 121* 155* 134*   Lipid Profile: No results for input(s): CHOL, HDL, LDLCALC, TRIG, CHOLHDL, LDLDIRECT in the  last 72 hours. Thyroid Function Tests: Recent Labs    03/31/19 1930  TSH 3.602   Anemia Panel: No results for input(s): VITAMINB12, FOLATE, FERRITIN, TIBC, IRON, RETICCTPCT in the last 72 hours. Urine analysis: No results found for: COLORURINE, APPEARANCEUR, LABSPEC, PHURINE, GLUCOSEU, HGBUR, BILIRUBINUR, KETONESUR, PROTEINUR, UROBILINOGEN, NITRITE, LEUKOCYTESUR Sepsis Labs: @LABRCNTIP (procalcitonin:4,lacticidven:4)  ) Recent Results (from the past 240 hour(s))  SARS Coronavirus 2 (CEPHEID - Performed in [place] hospital lab), Hosp Order     Status: None   Collection Time: 03/31/19  1:57 PM   Specimen: Nasopharyngeal Swab  Result Value Ref Range Status   SARS Coronavirus 2 NEGATIVE NEGATIVE Final    Comment: (NOTE) If result is  NEGATIVE SARS-CoV-2 target nucleic acids are NOT DETECTED. The SARS-CoV-2 RNA is generally detectable in upper and lower  respiratory specimens during the acute phase of infection. The lowest  concentration of SARS-CoV-2 viral copies this assay can detect is 250  copies / mL. A negative result does not preclude SARS-CoV-2 infection  and should not be used as the sole basis for treatment or other  patient management decisions.  A negative result may occur with  improper specimen collection / handling, submission of specimen other  than nasopharyngeal swab, presence of viral mutation(s) within the  areas targeted by this assay, and inadequate number of viral copies  (<250 copies / mL). A negative result must be combined with clinical  observations, patient history, and epidemiological information. If result is POSITIVE SARS-CoV-2 target nucleic acids are DETECTED. The SARS-CoV-2 RNA is generally detectable in upper and lower  respiratory specimens dur ing the acute phase of infection.  Positive  results are indicative of active infection with SARS-CoV-2.  Clinical  correlation with patient history and other diagnostic information is  necessary to determine patient infection status.  Positive results do  not rule out bacterial infection or co-infection with other viruses. If result is PRESUMPTIVE POSTIVE SARS-CoV-2 nucleic acids MAY BE PRESENT.   A presumptive positive result was obtained on the submitted specimen  and confirmed on repeat testing.  While 2019 novel coronavirus  (SARS-CoV-2) nucleic acids may be present in the submitted sample  additional confirmatory testing may be necessary for epidemiological  and / or clinical management purposes  to differentiate between  SARS-CoV-2 and other Sarbecovirus currently known to infect humans.  If clinically indicated additional testing with an alternate test  methodology [phone]) is advised. The SARS-CoV-2 RNA is generally  detectable  in upper and lower respiratory sp ecimens during the acute  phase of infection. The expected result is Negative. Fact Sheet for Patients:  StrictlyIdeas.no Fact Sheet for Healthcare Providers: BankingDealers.co.za This test is not yet approved or cleared by the [place] FDA and has been authorized for detection and/or diagnosis of SARS-CoV-2 by FDA under an Emergency Use Authorization (EUA).  This EUA will remain in effect (meaning this test can be used) for the duration of the COVID-19 declaration under Section 564(b)(1) of the Act, 21 U.S.C. section 360bbb-3(b)(1), unless the authorization is terminated or revoked sooner. Performed at [facility], [place] [address] [place], [place] [address]   MRSA PCR Screening     Status: None   Collection Time: 04/01/19  7:00 AM   Specimen: Nasopharyngeal  Result Value Ref Range Status   MRSA by PCR NEGATIVE NEGATIVE Final    Comment:        The GeneXpert MRSA Assay (FDA approved for NASAL specimens only), is one component of a  comprehensive MRSA colonization surveillance program. It is not intended to diagnose MRSA infection nor to guide or monitor treatment for MRSA infections. Performed at [facility], [place] [address], [place], [place] [address]          Radiology Studies: US Renal  Result Date: 04/01/2019 CLINICAL DATA:  Acute kidney injury. EXAM: RENAL / URINARY TRACT ULTRASOUND COMPLETE COMPARISON:  None. FINDINGS: Right Kidney: Pelvic kidney located in the right lower quadrant. Renal measurements: 13 x 4.8 x 4.5 cm = volume: 145 mL. Renal collecting system is duplicated. Echogenicity within normal limits. Two subcentimeter cysts. No solid mass or hydronephrosis visualized. Left Kidney: Not visualized in the renal fossa or abdomen. Bladder: Appears normal for degree of bladder distention. IMPRESSION: 1. 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 Solitary  kidney located in the right pelvis with duplicated renal collecting system. 2. No evidence of renal obstruction.  Two subcentimeter renal cysts. Electronically Signed   By: Keith Rake M.D.   On: 04/01/2019 02:39        Scheduled Meds:  allopurinol  100 mg Oral BID   amLODipine  10 mg Oral QHS   aspirin EC  81 mg Oral Daily   atorvastatin  40 mg Oral QHS   calcitRIOL  0.25 mcg Oral Daily   doxazosin  8 mg Oral QHS   fluticasone  2 spray Each Nare Daily   heparin  5,000 Units Subcutaneous Q8H   insulin aspart  0-15 Units Subcutaneous TID WC   insulin aspart  0-5 Units Subcutaneous QHS   levothyroxine  75 mcg Oral QAC breakfast   montelukast  10 mg Oral Daily   sodium chloride flush  3 mL Intravenous Q12H   Continuous Infusions:  sodium chloride 75 mL/hr at 04/02/19 1315     LOS: 1 day   The patient is critically ill with multiple organ systems failure and requires high complexity decision making for assessment and support, frequent evaluation and titration of therapies, application of advanced monitoring technologies and extensive interpretation of multiple databases. Critical Care Time devoted to patient care services described in this note  Time spent: 40 minutes    Zianne Schubring, Geraldo Docker, MD [facility] Pager [phone]  If 7PM-7AM, please contact night-coverage [email or web address] Password [email or web address] 04/02/2019, 4:04 PM

## 2019-04-02 NOTE — Progress Notes (Signed)
Spoke with pt's spouse with pt permission, regarding plan of care. Questions and concerns were answered.

## 2019-04-02 NOTE — Progress Notes (Signed)
Spoke with pt's spouse with pt's permission, regarding plan of care. Questions and concerns were answered this morning.

## 2019-04-02 NOTE — Progress Notes (Signed)
RT offered pt CPAP dream station for the night and pt stated to this RT that he did not particularly want to wear it. RT expressed to pt that if he changes his mind to have RN call so that we can set it up for him. Pt respiratory status stable at this time. RT will continue to monitor.

## 2019-04-02 NOTE — Plan of Care (Signed)
  Problem: Activity: Goal: Risk for activity intolerance will decrease Outcome: Progressing   Problem: Elimination: Goal: Will not experience complications related to bowel motility Outcome: Progressing   Problem: Safety: Goal: Ability to remain free from injury will improve Outcome: Progressing   

## 2019-04-03 ENCOUNTER — Encounter (HOSPITAL_COMMUNITY): Payer: Medicare HMO

## 2019-04-03 DIAGNOSIS — R000 Tachycardia, unspecified: Secondary | ICD-10-CM

## 2019-04-03 DIAGNOSIS — I272 Pulmonary hypertension, unspecified: Secondary | ICD-10-CM

## 2019-04-03 DIAGNOSIS — Z9989 Dependence on other enabling machines and devices: Secondary | ICD-10-CM

## 2019-04-03 DIAGNOSIS — I5032 Chronic diastolic (congestive) heart failure: Secondary | ICD-10-CM

## 2019-04-03 DIAGNOSIS — E038 Other specified hypothyroidism: Secondary | ICD-10-CM

## 2019-04-03 DIAGNOSIS — N179 Acute kidney failure, unspecified: Secondary | ICD-10-CM

## 2019-04-03 LAB — LIPID PANEL
Cholesterol: 103 mg/dL (ref 0–200)
HDL: 41 mg/dL (ref >40)
LDL Cholesterol: 53 mg/dL (ref 0–99)
Total CHOL/HDL Ratio: 2.5 RATIO
Triglycerides: 45 mg/dL (ref <150)
VLDL: 9 mg/dL (ref 0–40)

## 2019-04-03 LAB — CBC
HCT: 36.7 % — ABNORMAL LOW (ref 39.0–52.0)
Hemoglobin: 11.7 g/dL — ABNORMAL LOW (ref 13.0–17.0)
MCH: 27 pg (ref 26.0–34.0)
MCHC: 31.9 g/dL (ref 30.0–36.0)
MCV: 84.8 fL (ref 80.0–100.0)
Platelets: 165 10*3/uL (ref 150–400)
RBC: 4.33 MIL/uL (ref 4.22–5.81)
RDW: 17 % — ABNORMAL HIGH (ref 11.5–15.5)
WBC: 8.6 10*3/uL (ref 4.0–10.5)
nRBC: 0 % (ref 0.0–0.2)

## 2019-04-03 LAB — BASIC METABOLIC PANEL
Anion gap: 9 (ref 5–15)
BUN: 101 mg/dL — ABNORMAL HIGH (ref 8–23)
CO2: 20 mmol/L — ABNORMAL LOW (ref 22–32)
Calcium: 8.8 mg/dL — ABNORMAL LOW (ref 8.9–10.3)
Chloride: 109 mmol/L (ref 98–111)
Creatinine, Ser: 3.89 mg/dL — ABNORMAL HIGH (ref 0.61–1.24)
GFR calc Af Amer: 16 mL/min — ABNORMAL LOW (ref >60)
GFR calc non Af Amer: 14 mL/min — ABNORMAL LOW (ref >60)
Glucose, Bld: 136 mg/dL — ABNORMAL HIGH (ref 70–99)
Potassium: 4.4 mmol/L (ref 3.5–5.1)
Sodium: 138 mmol/L (ref 135–145)

## 2019-04-03 LAB — GLUCOSE, CAPILLARY
Glucose-Capillary: 135 mg/dL — ABNORMAL HIGH (ref 70–99)
Glucose-Capillary: 210 mg/dL — ABNORMAL HIGH (ref 70–99)
Glucose-Capillary: 92 mg/dL (ref 70–99)
Glucose-Capillary: 160 mg/dL — ABNORMAL HIGH (ref 70–99)

## 2019-04-03 LAB — MAGNESIUM: Magnesium: 2.8 mg/dL — ABNORMAL HIGH (ref 1.7–2.4)

## 2019-04-03 MED ORDER — CARVEDILOL 3.125 MG PO TABS
3.1250 mg | ORAL_TABLET | Freq: Two times a day (BID) | ORAL | Status: DC
  Administered 2019-04-03 – 2019-04-05 (×4): 3.125 mg via ORAL
  Filled 2019-04-03 (×4): qty 1

## 2019-04-03 MED ORDER — FUROSEMIDE 80 MG PO TABS
80.0000 mg | ORAL_TABLET | Freq: Every day | ORAL | Status: DC
  Administered 2019-04-03 – 2019-04-05 (×3): 80 mg via ORAL
  Filled 2019-04-03 (×3): qty 1

## 2019-04-03 NOTE — Care Management Important Message (Signed)
Important Message  Patient Details  Name: David Nguyen MRN: [id number] Date of Birth: 03/25/1941   Medicare Important Message Given:  Yes     Shelda Altes 04/03/2019, 1:21 PM

## 2019-04-03 NOTE — Progress Notes (Signed)
Pt HR converted from AFIB to ST. Paged MD. No new orders has been placed.

## 2019-04-03 NOTE — Progress Notes (Signed)
PROGRESS NOTE    David Nguyen  UXL:[id number] DOB: 1941-05-05 DOA: 03/31/2019 PCP: [name], MD   Brief Narrative:  78 y.o. BM PMHx HTN, HLD, CKD stage IV/V, hypothyroidism, DM type II controlled with complication, OSA on CPAP, and iron deficiency anemia;   Presented with complaints of generalized weakness.  He complains of feeling generalized weakness and dizziness for the last several days.  He was seen by his primary care provider for further evaluation of symptoms and was found to be bradycardic into the 40s.  He is on medications of Coreg for treatment of high blood pressure which he takes as prescribed.  Associated symptoms include some leg swelling.  Denies having any focal weakness, fever, chest pain, loss of consciousness, falls, dysuria, or shortness of breath.  In outpatient setting patient reports that he is followed by Dr. Jimmy Footman of [facility].  He has a left forearm fistula in place, but is not on dialysis.  Patient manages his own medications.     Subjective: 6/25 A/O x4, negative CP, negative abdominal pain, negative S OB.  Patient ambulating without dizziness or fatigue.   Assessment & Plan:   Principal Problem:   Bradycardia Active Problems:   Essential hypertension   Sleep apnea   CKD stage 4 due to type 2 diabetes mellitus (HCC)   History of anemia due to CKD   Hypothyroidism   Hyperkalemia   [place] kidney   Bradycardia - Seen by EP Dr. Curt Bears - Per Dr. Curt Bears may require pacer in the future. - Coreg has been on hold now patient's heart rate 90s to 118, SBP now elevated 150s. - Currently sinus tachycardia asymptomatic  Chronic diastolic dysfunction/Pulmonary HTN/sinus tachycardia -Strict in and out -130.10ml -Daily weight Filed Weights   04/01/19 0328 04/02/19 0331 04/03/19 0541  Weight: 120.4 kg 118 kg 117 kg  -Amlodipine 10 mg daily - Hold ACE/ARB secondary to renal function: Renal function is improving but not back to  baseline see below - Will restart Coreg 3.125 mg BID (previous dose 12.5 mg BID).  Given patient's sinus tachycardia will need a nodal blocking agent. -Hydralazine PRN -Schedule follow-up with Dr. Quay Burow in 3 weeks chronic diastolic dysfunction/pulmonary HTN/sinus tachycardia   Essential HTN -See diastolic dysfunction -Schedule follow-up appointment in 1 week Dr. Bradly Chris hospitalization CKD stage V vs ESRD, chronic diastolic dysfunction  Hyperkalemia - Monitor daily - Most likely secondary to ACE inhibitor and end-stage renal disease  -Resolved  CKD stage V vs ESRD - LEFT upper extremity fistula but currently not on HD -Per nephrology note patient would want HD if required - 6/25 per nephrology secondary to patient's continue improving renal function no HD required at this time. Recent Labs  Lab 03/31/19 1330 03/31/19 1723 04/01/19 0511 04/02/19 0639 04/03/19 0637  CREATININE 4.57* 4.39* 4.70* 4.59* 3.89*  -Schedule follow-up appointment in 2 weeks with Dr. Jeneen Rinks [place]  CKD stage V vs ESRD   Diabrcntip[creatinine:5]abetes type 2  controlled with complication - 5/36 hemoglobin A1c= 7 - Lipid panel pending -Patient on 70/30 Humulin 20 units every morning/16 units every afternoon at home.  Not requiring this coverage while hospitalized - Moderate SSI  Left basilar opacity - Patient asymptomatic, negative fever, negative leukocytosis will monitor for now    Anemia of chronic disease - Hemoglobin on admission 11.5 g/dL  Hypothyroidism -  Synthroid 75 mcg daily   Metabolic acidosis -Resolving   OSA on CPAP -CPAP per respiratory therapy     Obesity:  BMI elevated 35. to 6 kg/m    DVT prophylaxis: Heparin Code Status: Full Family Communication: None Disposition Plan: TBD   Consultants:  Nephrology EP Cardiology   Procedures/Significant Events:  6/23 echocardiogram:Left Ventricle:-Hyperdynamic systolic function, with LVEF >65%.-Moderate  concentric left ventricular hypertrophy.  -Left ventricular diastolic Doppler parameters are consistent with restrictive filling.  -Right ventricular systolic pressure moderate to severely elevated. Left Atrium: severely dilated. Right Atrium: moderately dilated. Interatrial Septum: right bowing of the interatrial septum, suggestive of elevated left atrial pressure.   I have personally reviewed and interpreted all radiology studies and my findings are as above.  VENTILATOR SETTINGS:    Cultures   Antimicrobials:    Devices    LINES / TUBES:      Continuous Infusions:    Objective: Vitals:   04/02/19 2003 04/02/19 2314 04/03/19 0504 04/03/19 0541  BP: (!) 140/55 (!) 158/56 (!) 120/55   Pulse: (!) 105 (!) 110 (!) 121   Resp: 20  20   Temp: 98.3 F (36.8 C)  97.7 F (36.5 C)   TempSrc: Oral  Oral   SpO2: 93% 93% 94%   Weight:    117 kg  Height:        Intake/Output Summary (Last 24 hours) at 04/03/2019 0843 Last data filed at 04/03/2019 1540 Gross per 24 hour  Intake 1859.1 ml  Output 400 ml  Net 1459.1 ml   Filed Weights   04/01/19 0328 04/02/19 0331 04/03/19 0541  Weight: 120.4 kg 118 kg 117 kg   Physical Exam:  General: A/O x4 no acute respiratory distress Eyes: negative scleral hemorrhage, negative anisocoria, negative icterus ENT: Negative Runny nose, negative gingival bleeding, Neck:  Negative scars, masses, torticollis, lymphadenopathy, JVD Lungs: Clear to auscultation bilaterally without wheezes or crackles Cardiovascular: Sinus tachycardia, without murmur gallop or rub normal S1 and S2 Abdomen: negative abdominal pain, nondistended, positive soft, bowel sounds, no rebound, no ascites, no appreciable mass Extremities: No significant cyanosis, clubbing, or edema bilateral lower extremities Skin: Negative rashes, lesions, ulcers Psychiatric:  Negative depression, negative anxiety, negative fatigue, negative mania Central nervous system:  Cranial  nerves II through XII intact, tongue/uvula midline, all extremities muscle strength 5/5, sensation intact throughout, negative dysarthria, negative expressive aphasia, negative receptive aphasia.       Data Reviewed: Care during the described time interval was provided by me .  I have reviewed this patient's available data, including medical history, events of note, physical examination, and all test results as part of my evaluation.   CBC: Recent Labs  Lab 03/31/19 1330 04/01/19 0511  WBC 8.9 8.1  NEUTROABS 6.3  --   HGB 11.5* 11.0*  HCT 36.5* 34.2*  MCV 85.9 83.2  PLT 181 086   Basic Metabolic Panel: Recent Labs  Lab 03/31/19 1330 03/31/19 1723 04/01/19 0511 04/02/19 0639 04/03/19 0637  NA 130* 130* 132* 136 138  K 6.5* 4.7 4.8 4.9 4.4  CL 99 100 101 106 109  CO2 18* 17* 20* 21* 20*  GLUCOSE 112* 109* 121* 152* 136*  BUN 113* 113* 118* 120* 101*  CREATININE 4.57* 4.39* 4.70* 4.59* 3.89*  CALCIUM 8.9 8.9 8.9 9.1 8.8*  MG  --   --   --   --  2.8*   GFR: Estimated Creatinine Clearance: 20 mL/min (A) (by C-G formula based on SCr of 3.89 mg/dL (H)). Liver Function Tests: Recent Labs  Lab 03/31/19 1330  AST 47*  ALT 51*  ALKPHOS 71  BILITOT 1.1  PROT  6.1*  ALBUMIN 3.4*   No results for input(s): LIPASE, AMYLASE in the last 168 hours. No results for input(s): AMMONIA in the last 168 hours. Coagulation Profile: No results for input(s): INR, PROTIME in the last 168 hours. Cardiac Enzymes: No results for input(s): CKTOTAL, CKMB, CKMBINDEX, TROPONINI in the last 168 hours. BNP (last 3 results) No results for input(s): PROBNP in the last 8760 hours. HbA1C: Recent Labs    03/31/19 1930  HGBA1C 7.0*   CBG: Recent Labs  Lab 04/02/19 0558 04/02/19 1139 04/02/19 1608 04/02/19 2117 04/03/19 0557  GLUCAP 155* 134* 131* 161* 135*   Lipid Profile: Recent Labs    04/03/19 0637  CHOL 103  HDL 41  LDLCALC 53  TRIG 45  CHOLHDL 2.5   Thyroid Function  Tests: Recent Labs    03/31/19 1930  TSH 3.602   Anemia Panel: No results for input(s): VITAMINB12, FOLATE, FERRITIN, TIBC, IRON, RETICCTPCT in the last 72 hours. Urine analysis: No results found for: COLORURINE, APPEARANCEUR, LABSPEC, PHURINE, GLUCOSEU, HGBUR, BILIRUBINUR, KETONESUR, PROTEINUR, UROBILINOGEN, NITRITE, LEUKOCYTESUR Sepsis Labs: @LABRCNTIP (procalcitonin:4,lacticidven:4)  ) Recent Results (from the past 240 hour(s))  SARS Coronavirus 2 (CEPHEID - Performed in [facility] hospital lab), Hosp Order     Status: None   Collection Time: 03/31/19  1:57 PM   Specimen: Nasopharyngeal Swab  Result Value Ref Range Status   SARS Coronavirus 2 NEGATIVE NEGATIVE Final    Comment: (NOTE) If result is NEGATIVE SARS-CoV-2 target nucleic acids are NOT DETECTED. The SARS-CoV-2 RNA is generally detectable in upper and lower  respiratory specimens during the acute phase of infection. The lowest  concentration of SARS-CoV-2 viral copies this assay can detect is 250  copies / mL. A negative result does not preclude SARS-CoV-2 infection  and should not be used as the sole basis for treatment or other  patient management decisions.  A negative result may occur with  improper specimen collection / handling, submission of specimen other  than nasopharyngeal swab, presence of viral mutation(s) within the  areas targeted by this assay, and inadequate number of viral copies  (<250 copies / mL). A negative result must be combined with clinical  observations, patient history, and epidemiological information. If result is POSITIVE SARS-CoV-2 target nucleic acids are DETECTED. The SARS-CoV-2 RNA is generally detectable in upper and lower  respiratory specimens dur ing the acute phase of infection.  Positive  results are indicative of active infection with SARS-CoV-2.  Clinical  correlation with patient history and other diagnostic information is  necessary to determine patient infection status.   Positive results do  not rule out bacterial infection or co-infection with other viruses. If result is PRESUMPTIVE POSTIVE SARS-CoV-2 nucleic acids MAY BE PRESENT.   A presumptive positive result was obtained on the submitted specimen  and confirmed on repeat testing.  While 2019 novel coronavirus  (SARS-CoV-2) nucleic acids may be present in the submitted sample  additional confirmatory testing may be necessary for epidemiological  and / or clinical management purposes  to differentiate between  SARS-CoV-2 and other Sarbecovirus currently known to infect humans.  If clinically indicated additional testing with an alternate test  methodology [id number]) is advised. The SARS-CoV-2 RNA is generally  detectable in upper and lower respiratory sp ecimens during the acute  phase of infection. The expected result is Negative. Fact Sheet for Patients:  StrictlyIdeas.no Fact Sheet for Healthcare Providers: BankingDealers.co.za This test is not yet approved or cleared by the [place] FDA and has been  authorized for detection and/or diagnosis of SARS-CoV-2 by FDA under an Emergency Use Authorization (EUA).  This EUA will remain in effect (meaning this test can be used) for the duration of the COVID-19 declaration under Section 564(b)(1) of the Act, 21 U.S.C. section 360bbb-3(b)(1), unless the authorization is terminated or revoked sooner. Performed at [facility], [place] [address], [place], [place] [address]   MRSA PCR Screening     Status: None   Collection Time: 04/01/19  7:00 AM   Specimen: Nasopharyngeal  Result Value Ref Range Status   MRSA by PCR NEGATIVE NEGATIVE Final    Comment:        The GeneXpert MRSA Assay (FDA approved for NASAL specimens only), is one component of a comprehensive MRSA colonization surveillance program. It is not intended to diagnose MRSA infection nor to guide or monitor treatment for MRSA  infections. Performed at [facility], [place] [address] [place] [address] [place], [place] [address]          Radiology Studies: No results found.      Scheduled Meds: . allopurinol  100 mg Oral BID  . amLODipine  10 mg Oral QHS  . aspirin EC  81 mg Oral Daily  . atorvastatin  40 mg Oral QHS  . calcitRIOL  0.25 mcg Oral Daily  . doxazosin  8 mg Oral QHS  . fluticasone  2 spray Each Nare Daily  . heparin  5,000 Units Subcutaneous Q8H  . insulin aspart  0-15 Units Subcutaneous TID WC  . insulin aspart  0-5 Units Subcutaneous QHS  . levothyroxine  75 mcg Oral QAC breakfast  . montelukast  10 mg Oral Daily  . sodium chloride flush  3 mL Intravenous Q12H   Continuous Infusions:    LOS: 2 days   The patient is critically ill with multiple organ systems failure and requires high complexity decision making for assessment and support, frequent evaluation and titration of therapies, application of advanced monitoring technologies and extensive interpretation of multiple databases. Critical Care Time devoted to patient care services described in this note  Time spent: 40 minutes    , Geraldo Docker, MD [facility] Pager [phone]  If 7PM-7AM, please contact night-coverage [email or web address] Password [facility] 04/03/2019, 8:43 AM

## 2019-04-03 NOTE — Progress Notes (Signed)
Progress Note  Patient Name: David Nguyen Date of Encounter: 04/03/2019  Primary Cardiologist: Dr. Quay Burow Cardiology electrophysiologist- Dr. Allegra Lai (new)  Subjective   [name] feels clinically improved today after discontinuing his beta-blocker.  Heart rate has increased from the mid to low 40s up to the 80 range.  Inpatient Medications    Scheduled Meds: . allopurinol  100 mg Oral BID  . amLODipine  10 mg Oral QHS  . aspirin EC  81 mg Oral Daily  . atorvastatin  40 mg Oral QHS  . calcitRIOL  0.25 mcg Oral Daily  . doxazosin  8 mg Oral QHS  . fluticasone  2 spray Each Nare Daily  . furosemide  80 mg Oral Daily  . heparin  5,000 Units Subcutaneous Q8H  . insulin aspart  0-15 Units Subcutaneous TID WC  . insulin aspart  0-5 Units Subcutaneous QHS  . levothyroxine  75 mcg Oral QAC breakfast  . montelukast  10 mg Oral Daily  . sodium chloride flush  3 mL Intravenous Q12H   Continuous Infusions:  PRN Meds: acetaminophen **OR** acetaminophen, albuterol, hydrALAZINE, ondansetron **OR** ondansetron (ZOFRAN) IV   Vital Signs    Vitals:   04/03/19 0504 04/03/19 0541 04/03/19 0905 04/03/19 0906  BP: (!) 120/55  (!) 128/50   Pulse: (!) 121  96 (!) 118  Resp: 20     Temp: 97.7 F (36.5 C)     TempSrc: Oral     SpO2: 94%     Weight:  117 kg    Height:        Intake/Output Summary (Last 24 hours) at 04/03/2019 1008 Last data filed at 04/03/2019 0803 Gross per 24 hour  Intake 1799.1 ml  Output 400 ml  Net 1399.1 ml   Last 3 Weights 04/03/2019 04/02/2019 04/01/2019  Weight (lbs) 257 lb 14.4 oz 260 lb 3.2 oz 265 lb 8 oz  Weight (kg) 116.983 kg 118.026 kg 120.43 kg      Telemetry    Sinus rhythm/sinus tachycardia- Personally Reviewed  ECG    Not performed today- Personally Reviewed  Physical Exam   GEN: No acute distress.   Neck: No JVD Cardiac: RRR, no murmurs, rubs, or gallops.  Respiratory: Clear to auscultation bilaterally. GI: Soft,  nontender, non-distended  MS: No edema; No deformity. Neuro:  Nonfocal  Psych: Normal affect   Labs    High Sensitivity Troponin:  No results for input(s): TROPONINIHS in the last 720 hours.    Cardiac EnzymesNo results for input(s): TROPONINI in the last 168 hours.  Recent Labs  Lab 03/31/19 1339  TROPIPOC 0.02     Chemistry Recent Labs  Lab 03/31/19 1330  04/01/19 0511 04/02/19 0639 04/03/19 0637  NA 130*   < > 132* 136 138  K 6.5*   < > 4.8 4.9 4.4  CL 99   < > 101 106 109  CO2 18*   < > 20* 21* 20*  GLUCOSE 112*   < > 121* 152* 136*  BUN 113*   < > 118* 120* 101*  CREATININE 4.57*   < > 4.70* 4.59* 3.89*  CALCIUM 8.9   < > 8.9 9.1 8.8*  PROT 6.1*  --   --   --   --   ALBUMIN 3.4*  --   --   --   --   AST 47*  --   --   --   --   ALT 51*  --   --   --   --  ALKPHOS 71  --   --   --   --   BILITOT 1.1  --   --   --   --   GFRNONAA 11*   < > 11* 11* 14*  GFRAA 13*   < > 13* 13* 16*  ANIONGAP 13   < > 11 9 9    < > = values in this interval not displayed.     Hematology Recent Labs  Lab 03/31/19 1330 04/01/19 0511  WBC 8.9 8.1  RBC 4.25 4.11*  HGB 11.5* 11.0*  HCT 36.5* 34.2*  MCV 85.9 83.2  MCH 27.1 26.8  MCHC 31.5 32.2  RDW 17.5* 16.8*  PLT 181 171    BNPNo results for input(s): BNP, PROBNP in the last 168 hours.   DDimer No results for input(s): DDIMER in the last 168 hours.   Radiology    No results found.  Cardiac Studies   2D echocardiogram (04/01/2019)  IMPRESSIONS    1. The left ventricle has hyperdynamic systolic function, with an ejection fraction of >65%. The cavity size was decreased. There is moderate concentric left ventricular hypertrophy. Left ventricular diastolic Doppler parameters are consistent with  restrictive filling. Elevated left atrial and left ventricular end-diastolic pressures.  2. The right ventricle has normal systolic function. The cavity was normal. There is no increase in right ventricular wall thickness.  Right ventricular systolic pressure moderate to severely elevated.  3. Left atrial size was severely dilated.  4. Right atrial size was moderately dilated.  5. Small pericardial effusion.  6. The pericardial effusion is posterior to the left ventricle.  7. Mild thickening of the mitral valve leaflet. There is moderate mitral annular calcification present. No evidence of mitral valve stenosis.  8. The aortic valve is tricuspid. Mild calcification of the aortic valve. Aortic valve regurgitation is trivial by color flow Doppler. Mild stenosis of the aortic valve.  9. The aortic arch and aortic root are normal in size and structure. 10. The inferior vena cava was dilated in size with <50% respiratory variability. 11. There is right bowing of the interatrial septum, suggestive of elevated left atrial pressure.  Patient Profile     David Nguyen is a 78 y.o. male with a hx of hypertension, hyperlipidemia, OSA on CPAP, diabetes and chronic kidney disease stage V (not on dialysis) who we were asked to see for evaluation of symptomatic bradycardia.   Assessment & Plan    1: Bradycardia- initial heart rates were in the 40s/sinus bradycardia/junctional bradycardia.  His beta-blocker was discontinued and his heart rate has appropriately increased to the 80-100 range.  He feels clinically improved  2: Chronic diastolic dysfunction- normal LV function with grade 3 diastolic dysfunction and elevated filling pressures  3: Moderate to severe pulmonary hypertension- probably related to obstructive sleep apnea on CPAP  4: Chronic renal insufficiency- CKD 5 currently with a fistula but not yet started dialysis   [facility] will sign off.   Medication Recommendations: Continue to hold beta-blocker Other recommendations (labs, testing, etc): None Follow up as an outpatient: TOC 7 follow-up and return office visit with me in 3 to 4 weeks.  For questions or updates, please contact [facility] Please  consult [email or web address] for contact info under        Signed, Quay Burow, MD  04/03/2019, 10:08 AM

## 2019-04-03 NOTE — Progress Notes (Signed)
Patient refused to ambulate at this time

## 2019-04-03 NOTE — Progress Notes (Signed)
[facility] Progress Note  Name: David Nguyen MRN: [id number] DOB: 07/10/1941  Chief Complaint:  Weakness   Subjective:   Strict ins/outs are not available.  5 voids as well as 400 mL charted.  He received a short trial of IV fluids yesterday.  Note TTE with pulmonary HTN.  Reports that his appetite is improving.  HR has been 121 as last charted from 504 and bedside vitals obtained during my exam with HR 90's to 110's.    Review of systems:  Denies shortness of breath  Denies n/v No cp  No dizziness   Intake/Output Summary (Last 24 hours) at 04/03/2019 0846 Last data filed at 04/03/2019 0803 Gross per 24 hour  Intake 1859.1 ml  Output 400 ml  Net 1459.1 ml    Vitals:  Vitals:   04/02/19 2003 04/02/19 2314 04/03/19 0504 04/03/19 0541  BP: (!) 140/55 (!) 158/56 (!) 120/55   Pulse: (!) 105 (!) 110 (!) 121   Resp: 20  20   Temp: 98.3 F (36.8 C)  97.7 F (36.5 C)   TempSrc: Oral  Oral   SpO2: 93% 93% 94%   Weight:    117 kg  Height:         Physical Exam:  General adult male in bed in no acute distress  HEENT normocephalic atraumatic extraocular movements intact sclera anicteric Neck supple trachea midline Lungs clear to auscultation bilaterally normal work of breathing at rest  Heart tachycardia  Abdomen soft nontender nondistended Extremities no lower extremity edema  Psych normal mood and affect Access: LUE AVF with bruit and thrill   Medications reviewed   Labs:  BMP Latest Ref Rng & Units 04/03/2019 04/02/2019 04/01/2019  Glucose 70 - 99 mg/dL 136(H) 152(H) 121(H)  BUN 8 - 23 mg/dL 101(H) 120(H) 118(H)  Creatinine 0.61 - 1.24 mg/dL 3.89(H) 4.59(H) 4.70(H)  Sodium 135 - 145 mmol/L 138 136 132(L)  Potassium 3.5 - 5.1 mmol/L 4.4 4.9 4.8  Chloride 98 - 111 mmol/L 109 106 101  CO2 22 - 32 mmol/L 20(L) 21(L) 20(L)  Calcium 8.9 - 10.3 mg/dL 8.8(L) 9.1 8.9     Assessment/Plan:   78 year old male with CKD 4/5, presenting with weakness and found  to have bradycardia with junctional rhythm on EKG, hyperkalemia at 6.5.  1. Hyperkalemia: secondary to CKD as well as ACE inhibitor.  Would not resume ACE inhibitor 2. CKD stage 4 - last Cr 3.9 from 10/2018 - no other recent Cr.  Renal US without obstruction.  Solitary kidney right pelvis.  Has LUE AVF in place        - We have held lasix up to this point now as well as lisinopril.  Would remain off of lisinopril.      - Clearance is improving with supportive measures and resolution of bradycardia      - No acute indication for dialysis and creatinine is back at most recent baseline with BUN downtrending  3. Junctional bradycardia: Per admitting team: Beta-blocker on hold and not to be resumed.  EP has evaluated and no plans for pacemaker.  Note hyperkalemia resolved  4. Mild metabolic acidosis secondary to CKD.  Improving.  Restarting lasix.  5. Anemia, on ESA as outpatient, at goal on last check  6. Left forearm radiocephalic AV fistula: Stable and mature 7. Secondary hyperparathyroidism on calcitriol  8. Pulmonary HTN - can resume home lasix 80mg  daily   No acute indication for dialysis and patient has returned to his  most recent baseline.  From a strictly nephrology standpoint, can resume his daily lasix and should follow-up with Dr. Jimmy Footman in 2 weeks in clinic for reassessment.  However, patient now with tachycardia off of nodal blocking agents and this would need to be controlled at discharge - note 90's to 110's on bedside vitals during my exam.  Per primary team   Claudia Desanctis, MD 04/03/2019 8:46 AM

## 2019-04-04 ENCOUNTER — Telehealth: Payer: Self-pay | Admitting: Cardiovascular Disease

## 2019-04-04 DIAGNOSIS — R000 Tachycardia, unspecified: Secondary | ICD-10-CM | POA: Diagnosis present

## 2019-04-04 DIAGNOSIS — I272 Pulmonary hypertension, unspecified: Secondary | ICD-10-CM | POA: Diagnosis present

## 2019-04-04 DIAGNOSIS — E1169 Type 2 diabetes mellitus with other specified complication: Secondary | ICD-10-CM

## 2019-04-04 DIAGNOSIS — E1159 Type 2 diabetes mellitus with other circulatory complications: Secondary | ICD-10-CM

## 2019-04-04 DIAGNOSIS — I5032 Chronic diastolic (congestive) heart failure: Secondary | ICD-10-CM | POA: Diagnosis present

## 2019-04-04 DIAGNOSIS — E669 Obesity, unspecified: Secondary | ICD-10-CM

## 2019-04-04 DIAGNOSIS — E118 Type 2 diabetes mellitus with unspecified complications: Secondary | ICD-10-CM | POA: Diagnosis present

## 2019-04-04 DIAGNOSIS — D638 Anemia in other chronic diseases classified elsewhere: Secondary | ICD-10-CM | POA: Diagnosis present

## 2019-04-04 DIAGNOSIS — I152 Hypertension secondary to endocrine disorders: Secondary | ICD-10-CM | POA: Diagnosis present

## 2019-04-04 DIAGNOSIS — G4730 Sleep apnea, unspecified: Secondary | ICD-10-CM

## 2019-04-04 DIAGNOSIS — N185 Chronic kidney disease, stage 5: Secondary | ICD-10-CM | POA: Diagnosis present

## 2019-04-04 LAB — BASIC METABOLIC PANEL
Anion gap: 12 (ref 5–15)
BUN: 80 mg/dL — ABNORMAL HIGH (ref 8–23)
CO2: 22 mmol/L (ref 22–32)
Calcium: 9.2 mg/dL (ref 8.9–10.3)
Chloride: 105 mmol/L (ref 98–111)
Creatinine, Ser: 3.56 mg/dL — ABNORMAL HIGH (ref 0.61–1.24)
GFR calc Af Amer: 18 mL/min — ABNORMAL LOW (ref >60)
GFR calc non Af Amer: 15 mL/min — ABNORMAL LOW (ref >60)
Glucose, Bld: 142 mg/dL — ABNORMAL HIGH (ref 70–99)
Potassium: 4.1 mmol/L (ref 3.5–5.1)
Sodium: 139 mmol/L (ref 135–145)

## 2019-04-04 LAB — CBC
HCT: 37.4 % — ABNORMAL LOW (ref 39.0–52.0)
Hemoglobin: 12 g/dL — ABNORMAL LOW (ref 13.0–17.0)
MCH: 26.7 pg (ref 26.0–34.0)
MCHC: 32.1 g/dL (ref 30.0–36.0)
MCV: 83.3 fL (ref 80.0–100.0)
Platelets: 188 10*3/uL (ref 150–400)
RBC: 4.49 MIL/uL (ref 4.22–5.81)
RDW: 17.1 % — ABNORMAL HIGH (ref 11.5–15.5)
WBC: 7.6 10*3/uL (ref 4.0–10.5)
nRBC: 0 % (ref 0.0–0.2)

## 2019-04-04 LAB — GLUCOSE, CAPILLARY
Glucose-Capillary: 134 mg/dL — ABNORMAL HIGH (ref 70–99)
Glucose-Capillary: 177 mg/dL — ABNORMAL HIGH (ref 70–99)
Glucose-Capillary: 141 mg/dL — ABNORMAL HIGH (ref 70–99)
Glucose-Capillary: 157 mg/dL — ABNORMAL HIGH (ref 70–99)

## 2019-04-04 LAB — MAGNESIUM: Magnesium: 2.5 mg/dL — ABNORMAL HIGH (ref 1.7–2.4)

## 2019-04-04 MED ORDER — INSULIN ASPART PROT & ASPART (70-30 MIX) 100 UNIT/ML ~~LOC~~ SUSP
10.0000 [IU] | Freq: Two times a day (BID) | SUBCUTANEOUS | Status: DC
  Administered 2019-04-04 – 2019-04-05 (×2): 10 [IU] via SUBCUTANEOUS
  Filled 2019-04-04 (×2): qty 10

## 2019-04-04 MED ORDER — INSULIN ASPART PROT & ASPART (70-30 MIX) 100 UNIT/ML ~~LOC~~ SUSP: 10.0000 [IU] | Freq: Two times a day (BID) | SUBCUTANEOUS | Status: DC

## 2019-04-04 NOTE — Telephone Encounter (Signed)
LVM for patient to call back and schedule hospital followup with one of Dr. Kennon Holter PAs.

## 2019-04-04 NOTE — Plan of Care (Signed)
  Problem: Education: Goal: Knowledge of General Education information will improve Description Including pain rating scale, medication(s)/side effects and non-pharmacologic comfort measures Outcome: Progressing   

## 2019-04-04 NOTE — Plan of Care (Signed)

## 2019-04-04 NOTE — Progress Notes (Signed)
RT offered pt CPAP dream station for the night and pt declined stated he does not want to wear. Pt respiratory status stable at this time. RT will continue to monitor.

## 2019-04-04 NOTE — Progress Notes (Signed)
Patient states that he spoke to his wife and don't need me to call and give an update. she will be doing a sleep test today .

## 2019-04-04 NOTE — Plan of Care (Signed)
  Problem: Health Behavior/Discharge Planning: Goal: Ability to manage health-related needs will improve Outcome: Progressing   Problem: Education: Goal: Knowledge of General Education information will improve Description: Including pain rating scale, medication(s)/side effects and non-pharmacologic comfort measures Outcome: Progressing   

## 2019-04-04 NOTE — Progress Notes (Signed)
David Nguyen heart rate has rebounded to mild tachycardia.  He originally was admitted with bradycardia and his beta-blockers were held.  I think start him back on low-dose beta-blocker (carvedilol 3.125 mg p.o. twice daily) would be acceptable.  Lorretta Harp, M.D., [facility], [facility], [place] [address] [place], [place]  [address]  [phone] 04/04/2019 10:36 AM

## 2019-04-04 NOTE — Progress Notes (Signed)
Pt offered CPAP dream station for the night and pt declined. Pt respiratory status stable at this time on RA SpO2 96%. RT will continue to monitor.

## 2019-04-04 NOTE — Progress Notes (Signed)
PROGRESS NOTE    David Nguyen  QQI:[id number] DOB: 31-Dec-1940 DOA: 03/31/2019 PCP: [name], MD   Brief Narrative:  78 y.o. BM PMHx HTN, HLD, CKD stage IV/V, hypothyroidism, DM type II controlled with complication, OSA on CPAP, and iron deficiency anemia;   Presented with complaints of generalized weakness.  He complains of feeling generalized weakness and dizziness for the last several days.  He was seen by his primary care provider for further evaluation of symptoms and was found to be bradycardic into the 40s.  He is on medications of Coreg for treatment of high blood pressure which he takes as prescribed.  Associated symptoms include some leg swelling.  Denies having any focal weakness, fever, chest pain, loss of consciousness, falls, dysuria, or shortness of breath.  In outpatient setting patient reports that he is followed by Dr. Jimmy Footman of [facility].  He has a left forearm fistula in place, but is not on dialysis.  Patient manages his own medications.     Subjective: 6/26 A/O x4, negative CP, negative abdominal pain negative S OB.  Assessment & Plan:   Principal Problem:   Bradycardia Active Problems:   Essential hypertension   Sleep apnea   CKD stage 4 due to type 2 diabetes mellitus (HCC)   History of anemia due to CKD   Hypothyroidism   Hyperkalemia   [place] kidney   Chronic diastolic CHF (congestive heart failure) (HCC)   Pulmonary hypertension (HCC)   Sinus tachycardia   CKD (chronic kidney disease), stage V (HCC)   Diabetes mellitus type 2, controlled, with complications (HCC)   Anemia of chronic disease   Obesity, diabetes, and hypertension syndrome (HCC)   Bradycardia - Seen by EP Dr. Curt Bears - Per Dr. Curt Bears may require pacer in the future. - Coreg has been on hold now patient's heart rate 90s to 118, SBP now elevated 150s. - Currently NSR after starting low-dose Coreg.  Discussed case with Dr. Candee Furbish cardiology and agrees  with plan.  Chronic diastolic CHF/Pulmonary HTN/sinus tachycardia -Strict in and out -130.56ml -Daily weight Filed Weights   04/02/19 0331 04/03/19 0541 04/04/19 0548  Weight: 118 kg 117 kg 114.1 kg  -Amlodipine 10 mg daily - Hold ACE/ARB secondary to renal function: Renal function is improving but not back to baseline see below - Will restart Coreg 3.125 mg BID (previous dose 12.5 mg BID).  Given patient's sinus tachycardia will need a nodal blocking agent. -Hydralazine PRN -Schedule follow-up with Dr. Quay Burow in 3 weeks chronic diastolic dysfunction/pulmonary HTN/sinus tachycardia   Essential HTN -See diastolic dysfunction -Schedule follow-up appointment in 1 week Dr. Bradly Chris hospitalization CKD stage V vs ESRD, chronic diastolic dysfunction -Patient's BP still elevated after starting low-dose Coreg however has only received 3 doses.  Given patient's age and previous experience with bradycardia, if still slightly elevated in the a.m. will allow permissive HTN and allow cardiology to make adjustments when he can be followed closely.  Hyperkalemia - Monitor daily - Most likely secondary to ACE inhibitor and end-stage renal disease  -Resolved  CKD stage V vs ESRD - LEFT upper extremity fistula but currently not on HD -Per nephrology note patient would want HD if required - 6/25 per nephrology secondary to patient's continue improving renal function no HD required at this time. Recent Labs  Lab 03/31/19 1723 04/01/19 0511 04/02/19 0639 04/03/19 0637 04/04/19 0621  CREATININE 4.39* 4.70* 4.59* 3.89* 3.56*  -Schedule follow-up appointment in 2 weeks with Dr. Jeneen Rinks  Deterding  CKD stage V vs ESRD   Diabetes type 2  controlled with complication - 5/46 hemoglobin A1c= 7 - Lipid panel pending  - Discontinue moderate SSI -on 70/30 Humulin 20 units every morning/16 units every afternoon at home -Restart 70/30 Humulin at lower dose 10 units BID  Left basilar opacity -  Patient asymptomatic, negative fever, negative leukocytosis will monitor for now    Anemia of chronic disease - Hemoglobin on admission 11.5 g/dL  Hypothyroidism -  Synthroid 75 mcg daily   Metabolic acidosis -Resolving   OSA on CPAP -CPAP per respiratory therapy     Obesity: BMI elevated 35. to 6 kg/m    DVT prophylaxis: Heparin Code Status: Full Family Communication: None Disposition Plan: TBD   Consultants:  Nephrology EP Cardiology   Procedures/Significant Events:  6/23 echocardiogram:Left Ventricle:-Hyperdynamic systolic function, with LVEF >65%.-Moderate concentric left ventricular hypertrophy.  -Left ventricular diastolic Doppler parameters are consistent with restrictive filling.  -Right ventricular systolic pressure moderate to severely elevated. Left Atrium: severely dilated. Right Atrium: moderately dilated. Interatrial Septum: right bowing of the interatrial septum, suggestive of elevated left atrial pressure.   I have personally reviewed and interpreted all radiology studies and my findings are as above.  VENTILATOR SETTINGS:    Cultures   Antimicrobials:    Devices    LINES / TUBES:      Continuous Infusions:    Objective: Vitals:   04/03/19 2134 04/04/19 0343 04/04/19 0548 04/04/19 1141  BP: (!) 122/36 (!) 156/58  (!) 157/59  Pulse: 84 87  79  Resp:  20  17  Temp:  98 F (36.7 C)  97.8 F (36.6 C)  TempSrc:  Oral  Oral  SpO2:  98%  98%  Weight:   114.1 kg   Height:        Intake/Output Summary (Last 24 hours) at 04/04/2019 1903 Last data filed at 04/04/2019 1730 Gross per 24 hour  Intake 1884 ml  Output 2750 ml  Net -866 ml   Filed Weights   04/02/19 0331 04/03/19 0541 04/04/19 0548  Weight: 118 kg 117 kg 114.1 kg   Physical Exam:  General: A/O x4 no acute respiratory distress Eyes: negative scleral hemorrhage, negative anisocoria, negative icterus ENT: Negative Runny nose, negative gingival bleeding, Neck:   Negative scars, masses, torticollis, lymphadenopathy, JVD Lungs: Clear to auscultation bilaterally without wheezes or crackles Cardiovascular: Regular rate and rhythm without murmur gallop or rub normal S1 and S2 Abdomen: negative abdominal pain, nondistended, positive soft, bowel sounds, no rebound, no ascites, no appreciable mass Extremities: No significant cyanosis, clubbing, or edema bilateral lower extremities Skin: Negative rashes, lesions, ulcers Psychiatric:  Negative depression, negative anxiety, negative fatigue, negative mania  Central nervous system:  Cranial nerves II through XII intact, tongue/uvula midline, all extremities muscle strength 5/5, sensation intact throughout,, negative dysarthria, negative expressive aphasia, negative receptive aphasia.        Data Reviewed: Care during the described time interval was provided by me .  I have reviewed this patient's available data, including medical history, events of note, physical examination, and all test results as part of my evaluation.   CBC: Recent Labs  Lab 03/31/19 1330 04/01/19 0511 04/03/19 0637 04/04/19 0621  WBC 8.9 8.1 8.6 7.6  NEUTROABS 6.3  --   --   --   HGB 11.5* 11.0* 11.7* 12.0*  HCT 36.5* 34.2* 36.7* 37.4*  MCV 85.9 83.2 84.8 83.3  PLT 181 171 165 568   Basic Metabolic Panel:  Recent Labs  Lab 03/31/19 1723 04/01/19 0511 04/02/19 0639 04/03/19 0637 04/04/19 0621  NA 130* 132* 136 138 139  K 4.7 4.8 4.9 4.4 4.1  CL 100 101 106 109 105  CO2 17* 20* 21* 20* 22  GLUCOSE 109* 121* 152* 136* 142*  BUN 113* 118* 120* 101* 80*  CREATININE 4.39* 4.70* 4.59* 3.89* 3.56*  CALCIUM 8.9 8.9 9.1 8.8* 9.2  MG  --   --   --  2.8* 2.5*   GFR: Estimated Creatinine Clearance: 21.6 mL/min (A) (by C-G formula based on SCr of 3.56 mg/dL (H)). Liver Function Tests: Recent Labs  Lab 03/31/19 1330  AST 47*  ALT 51*  ALKPHOS 71  BILITOT 1.1  PROT 6.1*  ALBUMIN 3.4*   No results for input(s): LIPASE,  AMYLASE in the last 168 hours. No results for input(s): AMMONIA in the last 168 hours. Coagulation Profile: No results for input(s): INR, PROTIME in the last 168 hours. Cardiac Enzymes: No results for input(s): CKTOTAL, CKMB, CKMBINDEX, TROPONINI in the last 168 hours. BNP (last 3 results) No results for input(s): PROBNP in the last 8760 hours. HbA1C: No results for input(s): HGBA1C in the last 72 hours. CBG: Recent Labs  Lab 04/03/19 1626 04/03/19 2105 04/04/19 0601 04/04/19 1142 04/04/19 1615  GLUCAP 92 160* 134* 177* 141*   Lipid Profile: Recent Labs    04/03/19 0637  CHOL 103  HDL 41  LDLCALC 53  TRIG 45  CHOLHDL 2.5   Thyroid Function Tests: No results for input(s): TSH, T4TOTAL, FREET4, T3FREE, THYROIDAB in the last 72 hours. Anemia Panel: No results for input(s): VITAMINB12, FOLATE, FERRITIN, TIBC, IRON, RETICCTPCT in the last 72 hours. Urine analysis: No results found for: COLORURINE, APPEARANCEUR, LABSPEC, PHURINE, GLUCOSEU, HGBUR, BILIRUBINUR, KETONESUR, PROTEINUR, UROBILINOGEN, NITRITE, LEUKOCYTESUR Sepsis Labs: @LABRCNTIP (procalcitonin:4,lacticidven:4)  ) Recent Results (from the past 240 hour(s))  SARS Coronavirus 2 (CEPHEID - Performed in [place] hospital lab), Hosp Order     Status: None   Collection Time: 03/31/19  1:57 PM   Specimen: Nasopharyngeal Swab  Result Value Ref Range Status   SARS Coronavirus 2 NEGATIVE NEGATIVE Final    Comment: (NOTE) If result is NEGATIVE SARS-CoV-2 target nucleic acids are NOT DETECTED. The SARS-CoV-2 RNA is generally detectable in upper and lower  respiratory specimens during the acute phase of infection. The lowest  concentration of SARS-CoV-2 viral copies this assay can detect is 250  copies / mL. A negative result does not preclude SARS-CoV-2 infection  and should not be used as the sole basis for treatment or other  patient management decisions.  A negative result may occur with  improper specimen  collection / handling, submission of specimen other  than nasopharyngeal swab, presence of viral mutation(s) within the  areas targeted by this assay, and inadequate number of viral copies  (<250 copies / mL). A negative result must be combined with clinical  observations, patient history, and epidemiological information. If result is POSITIVE SARS-CoV-2 target nucleic acids are DETECTED. The SARS-CoV-2 RNA is generally detectable in upper and lower  respiratory specimens dur ing the acute phase of infection.  Positive  results are indicative of active infection with SARS-CoV-2.  Clinical  correlation with patient history and other diagnostic information is  necessary to determine patient infection status.  Positive results do  not rule out bacterial infection or co-infection with other viruses. If result is PRESUMPTIVE POSTIVE SARS-CoV-2 nucleic acids MAY BE PRESENT.   A presumptive positive result was obtained  on the submitted specimen  and confirmed on repeat testing.  While 2019 novel coronavirus  (SARS-CoV-2) nucleic acids may be present in the submitted sample  additional confirmatory testing may be necessary for epidemiological  and / or clinical management purposes  to differentiate between  SARS-CoV-2 and other Sarbecovirus currently known to infect humans.  If clinically indicated additional testing with an alternate test  methodology [phone]) is advised. The SARS-CoV-2 RNA is generally  detectable in upper and lower respiratory sp ecimens during the acute  phase of infection. The expected result is Negative. Fact Sheet for Patients:  StrictlyIdeas.no Fact Sheet for Healthcare Providers: BankingDealers.co.za This test is not yet approved or cleared by the [place] FDA and has been authorized for detection and/or diagnosis of SARS-CoV-2 by FDA under an Emergency Use Authorization (EUA).  This EUA will remain in effect  (meaning this test can be used) for the duration of the COVID-19 declaration under Section 564(b)(1) of the Act, 21 U.S.C. section 360bbb-3(b)(1), unless the authorization is terminated or revoked sooner. Performed at [facility], [place] [address] [place], [place] [address]   MRSA PCR Screening     Status: None   Collection Time: 04/01/19  7:00 AM   Specimen: Nasopharyngeal  Result Value Ref Range Status   MRSA by PCR NEGATIVE NEGATIVE Final    Comment:        The GeneXpert MRSA Assay (FDA approved for NASAL specimens only), is one component of a comprehensive MRSA colonization surveillance program. It is not intended to diagnose MRSA infection nor to guide or monitor treatment for MRSA infections. Performed at [facility], [place] [address] [place], [place] [address]          Radiology Studies: No results found.      Scheduled Meds: . allopurinol  100 mg Oral BID  . amLODipine  10 mg Oral QHS  . aspirin EC  81 mg Oral Daily  . atorvastatin  40 mg Oral QHS  . calcitRIOL  0.25 mcg Oral Daily  . carvedilol  3.125 mg Oral BID WC  . doxazosin  8 mg Oral QHS  . fluticasone  2 spray Each Nare Daily  . furosemide  80 mg Oral Daily  . heparin  5,000 Units Subcutaneous Q8H  . insulin aspart protamine- aspart  10 Units Subcutaneous BID WC  . levothyroxine  75 mcg Oral QAC breakfast  . montelukast  10 mg Oral Daily  . sodium chloride flush  3 mL Intravenous Q12H   Continuous Infusions:    LOS: 3 days   The patient is critically ill with multiple organ systems failure and requires high complexity decision making for assessment and support, frequent evaluation and titration of therapies, application of advanced monitoring technologies and extensive interpretation of multiple databases. Critical Care Time devoted to patient care services described in this note  Time spent: 40 minutes    , Geraldo Docker, MD [facility] Pager [phone]  If  7PM-7AM, please contact night-coverage [email or web address] Password [facility] 04/04/2019, 7:03 PM

## 2019-04-05 DIAGNOSIS — Z794 Long term (current) use of insulin: Secondary | ICD-10-CM

## 2019-04-05 LAB — BASIC METABOLIC PANEL
Anion gap: 10 (ref 5–15)
BUN: 71 mg/dL — ABNORMAL HIGH (ref 8–23)
CO2: 23 mmol/L (ref 22–32)
Calcium: 9.5 mg/dL (ref 8.9–10.3)
Chloride: 107 mmol/L (ref 98–111)
Creatinine, Ser: 3.22 mg/dL — ABNORMAL HIGH (ref 0.61–1.24)
GFR calc Af Amer: 20 mL/min — ABNORMAL LOW (ref >60)
GFR calc non Af Amer: 17 mL/min — ABNORMAL LOW (ref >60)
Glucose, Bld: 132 mg/dL — ABNORMAL HIGH (ref 70–99)
Potassium: 4 mmol/L (ref 3.5–5.1)
Sodium: 140 mmol/L (ref 135–145)

## 2019-04-05 LAB — CBC
HCT: 38.8 % — ABNORMAL LOW (ref 39.0–52.0)
Hemoglobin: 12.3 g/dL — ABNORMAL LOW (ref 13.0–17.0)
MCH: 26.5 pg (ref 26.0–34.0)
MCHC: 31.7 g/dL (ref 30.0–36.0)
MCV: 83.6 fL (ref 80.0–100.0)
Platelets: 183 10*3/uL (ref 150–400)
RBC: 4.64 MIL/uL (ref 4.22–5.81)
RDW: 16.8 % — ABNORMAL HIGH (ref 11.5–15.5)
WBC: 8 10*3/uL (ref 4.0–10.5)
nRBC: 0 % (ref 0.0–0.2)

## 2019-04-05 LAB — MAGNESIUM: Magnesium: 2.2 mg/dL (ref 1.7–2.4)

## 2019-04-05 LAB — GLUCOSE, CAPILLARY: Glucose-Capillary: 107 mg/dL — ABNORMAL HIGH (ref 70–99)

## 2019-04-05 MED ORDER — CARVEDILOL 3.125 MG PO TABS: 3.1250 mg | ORAL_TABLET | Freq: Two times a day (BID) | ORAL | 0 refills | Status: DC

## 2019-04-05 MED ORDER — FUROSEMIDE 80 MG PO TABS: 80.0000 mg | ORAL_TABLET | Freq: Every day | ORAL | 0 refills | Status: DC

## 2019-04-05 MED ORDER — INSULIN ISOPHANE & REGULAR (HUMAN 70-30)100 UNIT/ML KWIKPEN: PEN_INJECTOR | SUBCUTANEOUS | 11 refills | Status: DC

## 2019-04-05 NOTE — Progress Notes (Signed)
         From chart review Dr [name] signed off 04/03/19. Restarted on low dose coreg 3.125mg  bid after bradycardia resolved and developed some tachycardia, rates look fine over night on low dose coreg. No additional recs  Please call with questions    Signed, Carlyle Dolly, MD  04/05/2019, 8:06 AM

## 2019-04-05 NOTE — Progress Notes (Signed)
RN explained fluid restriction, as pt is constantly requesting water.

## 2019-04-05 NOTE — Progress Notes (Signed)
Discussed discharge instructions with patient, including medications and follow up appointments.   Discussed importance of monitoring daily weights and Heart rate.

## 2019-04-05 NOTE — Discharge Summary (Signed)
Physician Discharge Summary  David Nguyen DZH:[id number] DOB: 01/01/1941 DOA: 03/31/2019  PCP: [name], MD  Admit date: 03/31/2019 Discharge date: 04/05/2019  Time spent: 35 minutes  Recommendations for Outpatient Follow-up:   Bradycardia - Seen by EP Dr. Curt Bears - Per Dr. Curt Bears may require pacer in the future. - Coreg has been on hold now patient's heart rate 90s to 118, SBP now elevated 150s. - Currently NSR after starting low-dose Coreg.  Discussed case with Dr. Candee Furbish cardiology and agrees with plan.  Chronic diastolic CHF/Pulmonary HTN/sinus tachycardia -Strict in and out -1.8 L -Daily weight Filed Weights   04/03/19 0541 04/04/19 0548 04/05/19 0429  Weight: 117 kg 114.1 kg 112 kg  -Amlodipine 10 mg daily - Hold ACE/ARB secondary to renal function: Renal function is improving but not back to baseline see below -  Coreg 3.125 mg BID (previous dose 12.5 mg BID).  Given patient's sinus tachycardia will need a nodal blocking agent. -Schedule follow-up with Dr. Quay Burow in 3 weeks chronic diastolic dysfunction/pulmonary HTN/sinus tachycardia  Essential HTN -See diastolic dysfunction -Schedule follow-up appointment in 1 week Dr. Bradly Chris hospitalization CKD stage V vs ESRD, chronic diastolic dysfunction -Patient's BP improved stable for discharge any further changes in BP medication will be made by his cardiologist or PCP.   Hyperkalemia - Monitor daily - Most likely secondary to ACE inhibitor and end-stage renal disease  -Resolved  CKD stage V vs ESRD - LEFT upper extremity fistula but currently not on HD -Per nephrology note patient would want HD if required - 6/25 per nephrology secondary to patient's continue improving renal function no HD required at this time. Recent Labs  Lab 04/01/19 0511 04/02/19 0639 04/03/19 0637 04/04/19 0621 04/05/19 0316  CREATININE 4.70* 4.59* 3.89* 3.56* 3.22*  -Continues to improve -Schedule follow-up  appointment in 2 weeks with Dr. Jeneen Rinks [place]  CKD stage V vs ESRD  Diabetes type 2 controlled with complication - 4/19 hemoglobin A1c= 7 - Lipid panel; LDL within AHA/ADA guidelines.  LDL goal<70 -on 70/30 Humulin 20 units every morning/16 units every afternoon at home -Continue patient on reduced dose Humulin.  70/30 Humulin 10 units BID, if patient maintains his appropriate diet will not require any additional insulin.  However if he returns to previous diet will need to titrate up his insulin.  We will leave that to his PCP to monitor.  Left basilar opacity - Patient asymptomatic, negative fever, negative leukocytosis will monitor for now.  PCP to monitor  Anemia of chronic disease - Hemoglobin on admission 11.5 g/dL--> 12.3  Hypothyroidism - Synthroid 75 mcg daily  Metabolic acidosis -Resolving  OSA on CPAP -CPAP per respiratory therapy   Obesity: BMI elevated 35. to 6 kg/m    Discharge Diagnoses:  Principal Problem:   Bradycardia Active Problems:   Essential hypertension   Sleep apnea   CKD stage 4 due to type 2 diabetes mellitus ([place])   History of anemia due to CKD   Hypothyroidism   Hyperkalemia   [place] kidney   Chronic diastolic CHF (congestive heart failure) (HCC)   Pulmonary hypertension (HCC)   Sinus tachycardia   CKD (chronic kidney disease), stage V ([place])   Diabetes mellitus type 2, controlled, with complications ([place])   Anemia of chronic disease   Obesity, diabetes, and hypertension syndrome ([place])   Discharge Condition: Stable  Diet recommendation: Heart healthy/carb modified  Filed Weights   04/03/19 0541 04/04/19 0548 04/05/19 0429  Weight: 117 kg 114.1 kg 112  kg    History of present illness:  78 y.o.BM PMHx HTN, HLD, CKD stage IV/V,hypothyroidism, DM type II controlled with complication,OSA on CPAP, and iron deficiency anemia;   Presented with complaints of generalized weakness. He complains of feeling generalized  weakness and dizziness for the last several days. He was seen by his primary care provider for further evaluation of symptoms and was found to be bradycardic into the 40s. He is on medications of Coreg for treatment of high blood pressure which he takes as prescribed. Associated symptoms include some leg swelling. Denies having any focal weakness, fever, chest pain, loss of consciousness, falls, dysuria, or shortness of breath. In outpatient setting patient reports that he is followed [facility]. He has a left forearm fistula in place, but is not on dialysis. Patient manages his own medications.   Hospital Course:  During his hospitalization patient was treated for symptomatic bradycardia.  Allowed patient's beta-blocker to washout and symptoms resolved, in fact patient became sinus tachycardic.  Restarted beta-blocker at a much reduced dose and patient's HR now appropriately controlled along with his BP.  Stable for discharge.    Procedures: 6/23 echocardiogram:Left Ventricle:-Hyperdynamic systolic function, with LVEF >65%.-Moderate concentric left ventricular hypertrophy.  -Left ventricular diastolic Doppler parameters are consistent with restrictive filling.  -Right ventricular systolic pressure moderate to severely elevated. Left Atrium: severely dilated. Right Atrium: moderately dilated. Interatrial Septum: right bowing of the interatrial septum, suggestive of elevated left atrial pressure.  Consultations: Nephrology EP Cardiology     Discharge Exam: Vitals:   04/04/19 1141 04/04/19 2015 04/04/19 2120 04/05/19 0429  BP: (!) 157/59 (!) 140/55 138/68 (!) 134/58  Pulse: 79 84 84 82  Resp: 17 18  18   Temp: 97.8 F (36.6 C) 98 F (36.7 C)  98.4 F (36.9 C)  TempSrc: Oral Oral  Oral  SpO2: 98% 96%  92%  Weight:    112 kg  Height:        General: A/O x4 no acute respiratory distress Eyes: negative scleral hemorrhage, negative  anisocoria, negative icterus ENT: Negative Runny nose, negative gingival bleeding, Neck:  Negative scars, masses, torticollis, lymphadenopathy, JVD Lungs: Clear to auscultation bilaterally without wheezes or crackles Cardiovascular: Regular rate and rhythm without murmur gallop or rub normal S1 and S2   Discharge Instructions   Allergies as of 04/05/2019      Reactions   Penicillins Other (See Comments)   UNSPECIFIED REACTION  Has patient had a PCN reaction causing immediate rash, facial/tongue/throat swelling, SOB or lightheadedness with hypotension: Unknown Has patient had a PCN reaction causing severe rash involving mucus membranes or skin necrosis: Unknown Has patient had a PCN reaction that required hospitalization: Unknown Has patient had a PCN reaction occurring within the last 10 years: No If all of the above answers are "NO", then may proceed with Cephalosporin use.      Medication List    STOP taking these medications   lisinopril 40 MG tablet Commonly known as: ZESTRIL   oxyCODONE-acetaminophen 5-325 MG tablet Commonly known as: PERCOCET/ROXICET     TAKE these medications   allopurinol 100 MG tablet Commonly known as: ZYLOPRIM Take 100 mg by mouth 2 (two) times daily.   amLODipine 10 MG tablet Commonly known as: NORVASC Take 10 mg by mouth at bedtime.   aspirin EC 81 MG tablet Take 81 mg by mouth daily.   atorvastatin 40 MG tablet Commonly known as: LIPITOR Take 40 mg by mouth at bedtime.  calcitRIOL 0.25 MCG capsule Commonly known as: ROCALTROL Take 0.25 mcg by mouth daily.   carvedilol 3.125 MG tablet Commonly known as: COREG Take 1 tablet (3.125 mg total) by mouth 2 (two) times daily with a meal. What changed:   medication strength  how much to take   doxazosin 8 MG tablet Commonly known as: CARDURA Take 8 mg by mouth at bedtime.   fluticasone 50 MCG/ACT nasal spray Commonly known as: FLONASE Place 2 sprays into both nostrils daily.    furosemide 80 MG tablet Commonly known as: LASIX Take 1 tablet (80 mg total) by mouth daily. What changed: how much to take   Insulin Isophane & Regular Human (70-30) 100 UNIT/ML PEN Commonly known as: HUMULIN 70/30 MIX Inject 10 units subcutaneously before breakfast and inject 10 units subcutaneously before supper What changed:   how much to take  how to take this  when to take this  additional instructions   levothyroxine 75 MCG tablet Commonly known as: SYNTHROID Take 75 mcg by mouth daily before breakfast.   montelukast 10 MG tablet Commonly known as: SINGULAIR Take 10 mg by mouth daily.   multivitamin with minerals Tabs tablet Take 1 tablet by mouth daily.   PRESCRIPTION MEDICATION Inhale into the lungs at bedtime. CPAP   Similasan Allergy Eye Relief Soln Place 1-2 drops into both eyes daily as needed (for dry eyes).   testosterone cypionate 200 MG/ML injection Commonly known as: DEPOTESTOSTERONE CYPIONATE Inject 200 mg into the muscle every 21 ( twenty-one) days.      Allergies  Allergen Reactions  . Penicillins Other (See Comments)    UNSPECIFIED REACTION  Has patient had a PCN reaction causing immediate rash, facial/tongue/throat swelling, SOB or lightheadedness with hypotension: Unknown Has patient had a PCN reaction causing severe rash involving mucus membranes or skin necrosis: Unknown Has patient had a PCN reaction that required hospitalization: Unknown Has patient had a PCN reaction occurring within the last 10 years: No If all of the above answers are "NO", then may proceed with Cephalosporin use.    Follow-up Information    Avva, Ravisankar, MD. Schedule an appointment as soon as possible for a visit in 1 week.   Specialty: Internal Medicine Why: Scheduler will call the patient and schedule appt. Contact information: [address] [place] [address] [phone]        Lorretta Harp, MD In 3 weeks.   Specialties: Cardiology,  Radiology Why: GO: July 17 at [facility] information: [address] [place] [address] [phone]        Mauricia Area, MD. Schedule an appointment as soon as possible for a visit in 2 weeks.   Specialty: Nephrology Why: PLEASE CALL AND SCHEDULE YOUR APPT. OFFICE IS CLOSED  Contact information: [place] [address] [phone]        [name], MD.   Specialty: Internal Medicine Contact information: [address] [place] [address] [phone]            The results of significant diagnostics from this hospitalization (including imaging, microbiology, ancillary and laboratory) are listed below for reference.    Significant Diagnostic Studies: US Renal  Result Date: 04/01/2019 CLINICAL DATA:  Acute kidney injury. EXAM: RENAL / URINARY TRACT ULTRASOUND COMPLETE COMPARISON:  None. FINDINGS: Right Kidney: Pelvic kidney located in the right lower quadrant. Renal measurements: 13 x 4.8 x 4.5 cm = volume: 145 mL. Renal collecting system is duplicated. Echogenicity within normal limits. Two subcentimeter  cysts. No solid mass or hydronephrosis visualized. Left Kidney: Not visualized in the renal fossa or abdomen. Bladder: Appears normal for degree of bladder distention. IMPRESSION: 1. 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 2. No evidence of renal obstruction.  Two subcentimeter renal cysts. Electronically Signed   By: Keith Rake M.D.   On: 04/01/2019 02:39   Dg Chest Port 1 View  Result Date: 03/31/2019 CLINICAL DATA:  Chest pain and shortness of breath EXAM: PORTABLE CHEST 1 VIEW COMPARISON:  Portable exam 1334 hours without priors for comparison FINDINGS: Enlargement of cardiac silhouette with pulmonary vascular congestion. Atherosclerotic calcification aorta. Hazy LEFT basilar infiltrate. Remaining lungs clear. No pleural  effusion or pneumothorax. Osseous structures unremarkable. IMPRESSION: Enlargement of cardiac silhouette with pulmonary vascular congestion. Hazy LEFT basilar infiltrate question pneumonia. Electronically Signed   By: Lavonia Dana M.D.   On: 03/31/2019 13:45    Microbiology: Recent Results (from the past 240 hour(s))  SARS Coronavirus 2 (CEPHEID - Performed in [facility] hospital lab), Hosp Order     Status: None   Collection Time: 03/31/19  1:57 PM   Specimen: Nasopharyngeal Swab  Result Value Ref Range Status   SARS Coronavirus 2 NEGATIVE NEGATIVE Final    Comment: (NOTE) If result is NEGATIVE SARS-CoV-2 target nucleic acids are NOT DETECTED. The SARS-CoV-2 RNA is generally detectable in upper and lower  respiratory specimens during the acute phase of infection. The lowest  concentration of SARS-CoV-2 viral copies this assay can detect is 250  copies / mL. A negative result does not preclude SARS-CoV-2 infection  and should not be used as the sole basis for treatment or other  patient management decisions.  A negative result may occur with  improper specimen collection / handling, submission of specimen other  than nasopharyngeal swab, presence of viral mutation(s) within the  areas targeted by this assay, and inadequate number of viral copies  (<250 copies / mL). A negative result must be combined with clinical  observations, patient history, and epidemiological information. If result is POSITIVE SARS-CoV-2 target nucleic acids are DETECTED. The SARS-CoV-2 RNA is generally detectable in upper and lower  respiratory specimens dur ing the acute phase of infection.  Positive  results are indicative of active infection with SARS-CoV-2.  Clinical  correlation with patient history and other diagnostic information is  necessary to determine patient infection status.  Positive results do  not rule out bacterial infection or co-infection with other viruses. If result is PRESUMPTIVE  POSTIVE SARS-CoV-2 nucleic acids MAY BE PRESENT.   A presumptive positive result was obtained on the submitted specimen  and confirmed on repeat testing.  While 2019 novel coronavirus  (SARS-CoV-2) nucleic acids may be present in the submitted sample  additional confirmatory testing may be necessary for epidemiological  and / or clinical management purposes  to differentiate between  SARS-CoV-2 and other Sarbecovirus currently known to infect humans.  If clinically indicated additional testing with an alternate test  methodology [phone]) is advised. The SARS-CoV-2 RNA is generally  detectable in upper and lower respiratory sp ecimens during the acute  phase of infection. The expected result is Negative. Fact Sheet for Patients:  StrictlyIdeas.no Fact Sheet for Healthcare Providers: BankingDealers.co.za This test is not yet approved or cleared by the [place] FDA and has been authorized for detection and/or diagnosis of SARS-CoV-2 by FDA under an Emergency Use Authorization (EUA).  This EUA will remain in  effect (meaning this test can be used) for the duration of the COVID-19 declaration under Section 564(b)(1) of the Act, 21 U.S.C. section 360bbb-3(b)(1), unless the authorization is terminated or revoked sooner. Performed at [facility], [place] [address] [place], [place] [address]   MRSA PCR Screening     Status: None   Collection Time: 04/01/19  7:00 AM   Specimen: Nasopharyngeal  Result Value Ref Range Status   MRSA by PCR NEGATIVE NEGATIVE Final    Comment:        The GeneXpert MRSA Assay (FDA approved for NASAL specimens only), is one component of a comprehensive MRSA colonization surveillance program. It is not intended to diagnose MRSA infection nor to guide or monitor treatment for MRSA infections. Performed at [facility], [place] [address], [place], [place] [address]      Labs: Basic Metabolic  Panel: Recent Labs  Lab 04/01/19 [phone] 04/02/19 0639 04/03/19 0637 04/04/19 0621 04/05/19 0316  NA 132* 136 138 139 140  K 4.8 4.9 4.4 4.1 4.0  CL 101 106 109 105 107  CO2 20* 21* 20* 22 23  GLUCOSE 121* 152* 136* 142* 132*  BUN 118* 120* 101* 80* 71*  CREATININE 4.70* 4.59* 3.89* 3.56* 3.22*  CALCIUM 8.9 9.1 8.8* 9.2 9.5  MG  --   --  2.8* 2.5* 2.2   Liver Function Tests: Recent Labs  Lab 03/31/19 1330  AST 47*  ALT 51*  ALKPHOS 71  BILITOT 1.1  PROT 6.1*  ALBUMIN 3.4*   No results for input(s): LIPASE, AMYLASE in the last 168 hours. No results for input(s): AMMONIA in the last 168 hours. CBC: Recent Labs  Lab 03/31/19 1330 04/01/19 0511 04/03/19 0637 04/04/19 0621 04/05/19 0316  WBC 8.9 8.1 8.6 7.6 8.0  NEUTROABS 6.3  --   --   --   --   HGB 11.5* 11.0* 11.7* 12.0* 12.3*  HCT 36.5* 34.2* 36.7* 37.4* 38.8*  MCV 85.9 83.2 84.8 83.3 83.6  PLT 181 171 165 188 183   Cardiac Enzymes: No results for input(s): CKTOTAL, CKMB, CKMBINDEX, TROPONINI in the last 168 hours. BNP: BNP (last 3 results) No results for input(s): BNP in the last 8760 hours.  ProBNP (last 3 results) No results for input(s): PROBNP in the last 8760 hours.  CBG: Recent Labs  Lab 04/04/19 0601 04/04/19 1142 04/04/19 1615 04/04/19 2046 04/05/19 0527  GLUCAP 134* 177* 141* 157* 107*       Signed:  Dia Crawford, MD [facility] [phone] pager

## 2019-04-10 ENCOUNTER — Encounter (HOSPITAL_COMMUNITY): Payer: Medicare HMO

## 2019-04-14 DIAGNOSIS — N184 Chronic kidney disease, stage 4 (severe): Secondary | ICD-10-CM | POA: Diagnosis not present

## 2019-04-16 DIAGNOSIS — I272 Pulmonary hypertension, unspecified: Secondary | ICD-10-CM | POA: Diagnosis not present

## 2019-04-16 DIAGNOSIS — D631 Anemia in chronic kidney disease: Secondary | ICD-10-CM | POA: Diagnosis not present

## 2019-04-16 DIAGNOSIS — E1129 Type 2 diabetes mellitus with other diabetic kidney complication: Secondary | ICD-10-CM | POA: Diagnosis not present

## 2019-04-16 DIAGNOSIS — I129 Hypertensive chronic kidney disease with stage 1 through stage 4 chronic kidney disease, or unspecified chronic kidney disease: Secondary | ICD-10-CM | POA: Diagnosis not present

## 2019-04-16 DIAGNOSIS — R001 Bradycardia, unspecified: Secondary | ICD-10-CM | POA: Diagnosis not present

## 2019-04-16 DIAGNOSIS — N184 Chronic kidney disease, stage 4 (severe): Secondary | ICD-10-CM | POA: Diagnosis not present

## 2019-04-16 DIAGNOSIS — I5032 Chronic diastolic (congestive) heart failure: Secondary | ICD-10-CM | POA: Diagnosis not present

## 2019-04-23 DIAGNOSIS — Z1159 Encounter for screening for other viral diseases: Secondary | ICD-10-CM | POA: Diagnosis not present

## 2019-04-23 DIAGNOSIS — D631 Anemia in chronic kidney disease: Secondary | ICD-10-CM | POA: Diagnosis not present

## 2019-04-23 DIAGNOSIS — N184 Chronic kidney disease, stage 4 (severe): Secondary | ICD-10-CM | POA: Diagnosis not present

## 2019-04-23 DIAGNOSIS — Q600 Renal agenesis, unilateral: Secondary | ICD-10-CM | POA: Diagnosis not present

## 2019-04-23 DIAGNOSIS — N2581 Secondary hyperparathyroidism of renal origin: Secondary | ICD-10-CM | POA: Diagnosis not present

## 2019-04-23 DIAGNOSIS — I770 Arteriovenous fistula, acquired: Secondary | ICD-10-CM | POA: Diagnosis not present

## 2019-04-23 DIAGNOSIS — E875 Hyperkalemia: Secondary | ICD-10-CM | POA: Diagnosis not present

## 2019-04-23 DIAGNOSIS — I129 Hypertensive chronic kidney disease with stage 1 through stage 4 chronic kidney disease, or unspecified chronic kidney disease: Secondary | ICD-10-CM | POA: Diagnosis not present

## 2019-04-24 ENCOUNTER — Telehealth: Payer: Self-pay | Admitting: Cardiovascular Disease

## 2019-04-24 NOTE — Telephone Encounter (Signed)
I called pt to confirm his appt with Dr [name] on 04-25-19.        COVID-19 Pre-Screening Questions:   In the past 7 to 10 days have you had a cough,  shortness of breath, headache, congestion, fever (100 or greater) body aches, chills, sore throat, or sudden loss of taste or sense of smell? no  Have you been around anyone with known Covid 19.  Have you been around anyone who is awaiting Covid 19 test results in the past 7 to 10 days? no  Have you been around anyone who has been exposed to Covid 19, or has mentioned symptoms of Covid 19 within the past 7 to 10 days? no  If you have any concerns/questions about symptoms patients report during screening (either on the phone or at threshold). Contact the provider seeing the patient or DOD for further guidance.  If neither are available contact a member of the leadership team.

## 2019-04-25 ENCOUNTER — Other Ambulatory Visit: Payer: Self-pay

## 2019-04-25 ENCOUNTER — Encounter: Payer: Self-pay | Admitting: Cardiovascular Disease

## 2019-04-25 ENCOUNTER — Ambulatory Visit (INDEPENDENT_AMBULATORY_CARE_PROVIDER_SITE_OTHER): Payer: Medicare HMO | Admitting: Cardiovascular Disease

## 2019-04-25 DIAGNOSIS — I5032 Chronic diastolic (congestive) heart failure: Secondary | ICD-10-CM | POA: Diagnosis not present

## 2019-04-25 DIAGNOSIS — R001 Bradycardia, unspecified: Secondary | ICD-10-CM

## 2019-04-25 DIAGNOSIS — I10 Essential (primary) hypertension: Secondary | ICD-10-CM

## 2019-04-25 DIAGNOSIS — N184 Chronic kidney disease, stage 4 (severe): Secondary | ICD-10-CM

## 2019-04-25 DIAGNOSIS — I272 Pulmonary hypertension, unspecified: Secondary | ICD-10-CM

## 2019-04-25 DIAGNOSIS — G4733 Obstructive sleep apnea (adult) (pediatric): Secondary | ICD-10-CM | POA: Diagnosis not present

## 2019-04-25 DIAGNOSIS — E1122 Type 2 diabetes mellitus with diabetic chronic kidney disease: Secondary | ICD-10-CM

## 2019-04-25 NOTE — Assessment & Plan Note (Signed)
History of essential hypertension with blood pressure measured today at 141/73.  He is on amlodipine and low-dose carvedilol.

## 2019-04-25 NOTE — Assessment & Plan Note (Signed)
History of obstructive sleep apnea on CPAP. 

## 2019-04-25 NOTE — Assessment & Plan Note (Signed)
History of hyperlipidemia on statin therapy with lipid profile performed 04/03/2019 revealing total cholesterol 103, LDL 53 and HDL 41

## 2019-04-25 NOTE — Assessment & Plan Note (Signed)
2D echo revealed normal LV systolic function with severe diastolic dysfunction on oral diuretics.

## 2019-04-25 NOTE — Assessment & Plan Note (Signed)
Patient was bradycardic on admission in the 63s.  We held his beta-blocker which resulted in rebound tachycardia.  I reinstituted low-dose carvedilol and his heart rate today 71.

## 2019-04-25 NOTE — Assessment & Plan Note (Signed)
History of moderate to severe pulmonary hypertension with an RV systolic pressure of 57 by 2D echo sick/23/20 probably related to obstructive sleep apnea.

## 2019-04-25 NOTE — Progress Notes (Signed)
04/25/2019 David Nguyen   [birth year]  [id number]  Primary Physician [name], MD Primary Cardiologist: Lorretta Harp MD [name], [place]  HPI:  David Nguyen is a 78 y.o. severely overweight married African-American male father of 92 children, grandfather of 48 grandchildren who still does long-term care in the mornings.  His primary care physician is Dr. Daleen Bo.  He was admitted to [facility] 03/31/2019 with bradycardia and discharged 5 days later.  During his hospitalization 2D echo revealed normal LV systolic function, diastolic dysfunction and moderate to severe pulmonary hypertension.  He does have stage III CKD with serum creatinine in the low 3 range though not yet on dialysis.  His other problems include treated hypertension, diabetes and hyperlipidemia.  His beta-blockers were adjusted, initially held and then reinstituted at a lower dose.  He feels clinically improved denying chest pain or shortness of breath.   Current Meds  Medication Sig  . allopurinol (ZYLOPRIM) 100 MG tablet Take 100 mg by mouth 2 (two) times daily.   Marland Kitchen amLODipine (NORVASC) 10 MG tablet Take 10 mg by mouth at bedtime.   Marland Kitchen aspirin EC 81 MG tablet Take 81 mg by mouth daily.  Marland Kitchen atorvastatin (LIPITOR) 40 MG tablet Take 40 mg by mouth at bedtime.   . calcitRIOL (ROCALTROL) 0.25 MCG capsule Take 0.25 mcg by mouth daily.   . carvedilol (COREG) 3.125 MG tablet Take 1 tablet (3.125 mg total) by mouth 2 (two) times daily with a meal.  . doxazosin (CARDURA) 8 MG tablet Take 8 mg by mouth at bedtime.    . fluticasone (FLONASE) 50 MCG/ACT nasal spray Place 2 sprays into both nostrils daily.   . furosemide (LASIX) 80 MG tablet Take 1 tablet (80 mg total) by mouth daily.  . Homeopathic Products [place] ALLERGY EYE RELIEF) SOLN Place 1-2 drops into both eyes daily as needed (for dry eyes).  . Insulin Isophane & Regular Human (HUMULIN 70/30 MIX) (70-30) 100 UNIT/ML PEN Inject 10 units  subcutaneously before breakfast and inject 10 units subcutaneously before supper  . levothyroxine (SYNTHROID, LEVOTHROID) 75 MCG tablet Take 75 mcg by mouth daily before breakfast.  . montelukast (SINGULAIR) 10 MG tablet Take 10 mg by mouth daily.   . Multiple Vitamin (MULTIVITAMIN WITH MINERALS) TABS tablet Take 1 tablet by mouth daily.  Marland Kitchen PRESCRIPTION MEDICATION Inhale into the lungs at bedtime. CPAP  . testosterone cypionate (DEPOTESTOSTERONE CYPIONATE) 200 MG/ML injection Inject 200 mg into the muscle every 21 ( twenty-one) days.     Allergies  Allergen Reactions  . Penicillins Other (See Comments)    UNSPECIFIED REACTION  Has patient had a PCN reaction causing immediate rash, facial/tongue/throat swelling, SOB or lightheadedness with hypotension: Unknown Has patient had a PCN reaction causing severe rash involving mucus membranes or skin necrosis: Unknown Has patient had a PCN reaction that required hospitalization: Unknown Has patient had a PCN reaction occurring within the last 10 years: No If all of the above answers are "NO", then may proceed with Cephalosporin use.     Social History   Socioeconomic History  . Marital status: Divorced    Spouse name: Not on file  . Number of children: 5  . Years of education: Not on file  . Highest education level: Not on file  Occupational History    Comment: [employer]  Social Needs  . Financial resource strain: Not on file  . Food insecurity    Worry: Not on file  Inability: Not on file  . Transportation needs    Medical: Not on file    Non-medical: Not on file  Tobacco Use  . Smoking status: Former Smoker    Packs/day: 0.30    Years: 3.00    Pack years: 0.90    Types: Cigarettes    Quit date: 10/09/1968    Years since quitting: 50.5  . Smokeless tobacco: Never Used  Substance and Sexual Activity  . Alcohol use: No  . Drug use: No  . Sexual activity: Not on file  Lifestyle  . Physical activity    Days per week: Not on  file    Minutes per session: Not on file  . Stress: Not on file  Relationships  . Social Herbalist on phone: Not on file    Gets together: Not on file    Attends religious service: Not on file    Active member of club or organization: Not on file    Attends meetings of clubs or organizations: Not on file    Relationship status: Not on file  . Intimate partner violence    Fear of current or ex partner: Not on file    Emotionally abused: Not on file    Physically abused: Not on file    Forced sexual activity: Not on file  Other Topics Concern  . Not on file  Social History Narrative  . Not on file     Review of Systems: General: negative for chills, fever, night sweats or weight changes.  Cardiovascular: negative for chest pain, dyspnea on exertion, edema, orthopnea, palpitations, paroxysmal nocturnal dyspnea or shortness of breath Dermatological: negative for rash Respiratory: negative for cough or wheezing Urologic: negative for hematuria Abdominal: negative for nausea, vomiting, diarrhea, bright red blood per rectum, melena, or hematemesis Neurologic: negative for visual changes, syncope, or dizziness All other systems reviewed and are otherwise negative except as noted above.    Blood pressure (!) 141/73, pulse 71, height 6' (1.829 m), weight 260 lb 12.8 oz (118.3 kg), SpO2 99 %.  General appearance: alert and no distress Neck: no adenopathy, no carotid bruit, no JVD, supple, symmetrical, trachea midline and thyroid not enlarged, symmetric, no tenderness/mass/nodules Lungs: clear to auscultation bilaterally Heart: regular rate and rhythm, S1, S2 normal, no murmur, click, rub or gallop Extremities: extremities normal, atraumatic, no cyanosis or edema Pulses: 2+ and symmetric Skin: Skin color, texture, turgor normal. No rashes or lesions Neurologic: Alert and oriented X 3, normal strength and tone. Normal symmetric reflexes. Normal coordination and gait  EKG not  performed today  ASSESSMENT AND PLAN:   CKD stage 4 due to type 2 diabetes mellitus (HCC) Serum creatinines in the low 3 range this of hypertension and diabetes.  Not yet on dialysis  HYPERLIPIDEMIA History of hyperlipidemia on statin therapy with lipid profile performed 04/03/2019 revealing total cholesterol 103, LDL 53 and HDL 41  Essential hypertension History of essential hypertension with blood pressure measured today at 141/73.  He is on amlodipine and low-dose carvedilol.  Sleep apnea History of obstructive sleep apnea on CPAP.  Bradycardia Patient was bradycardic on admission in the 40s.  We held his beta-blocker which resulted in rebound tachycardia.  I reinstituted low-dose carvedilol and his heart rate today 71.  Pulmonary hypertension (HCC) History of moderate to severe pulmonary hypertension with an RV systolic pressure of 57 by 2D echo sick/23/20 probably related to obstructive sleep apnea.  Chronic diastolic CHF (congestive heart failure) ([place]) 2D  echo revealed normal LV systolic function with severe diastolic dysfunction on oral diuretics.      Lorretta Harp MD FACP,FACC,FAHA, [facility] 04/25/2019 8:17 AM

## 2019-04-25 NOTE — Patient Instructions (Signed)
Medication Instructions:  Your physician recommends that you continue on your current medications as directed. Please refer to the Current Medication list given to you today.  If you need a refill on your cardiac medications before your next appointment, please call your pharmacy.   Lab work: none If you have labs (blood work) drawn today and your tests are completely normal, you will receive your results only by: Marland Kitchen MyChart Message (if you have MyChart) OR . A paper copy in the mail If you have any lab test that is abnormal or we need to change your treatment, we will call you to review the results.  Testing/Procedures: none  Follow-Up: At [facility], you and your health needs are our priority.  As part of our continuing mission to provide you with exceptional heart care, we have created designated Provider Care Teams.  These Care Teams include your primary Cardiologist (physician) and Advanced Practice Providers (APPs -  Physician Assistants and Nurse Practitioners) who all work together to provide you with the care you need, when you need it. You will need a follow up appointment in 12 months with Dr. [name].  Please call our office 2 months in advance to schedule this appointment.

## 2019-04-25 NOTE — Assessment & Plan Note (Signed)
Serum creatinines in the low 3 range this of hypertension and diabetes.  Not yet on dialysis

## 2019-05-06 ENCOUNTER — Other Ambulatory Visit: Payer: Self-pay

## 2019-05-06 ENCOUNTER — Ambulatory Visit (HOSPITAL_COMMUNITY)
Admission: RE | Admit: 2019-05-06 | Discharge: 2019-05-06 | Disposition: A | Discharge status: Home / Self Care | Payer: Medicare HMO | Source: Ambulatory Visit | Attending: Nephrology | Admitting: Nephrology

## 2019-05-06 VITALS — BP 155/74 | HR 77 | Temp 97.6°F | Resp 20

## 2019-05-06 DIAGNOSIS — Z862 Personal history of diseases of the blood and blood-forming organs and certain disorders involving the immune mechanism: Secondary | ICD-10-CM | POA: Diagnosis not present

## 2019-05-06 DIAGNOSIS — N189 Chronic kidney disease, unspecified: Secondary | ICD-10-CM | POA: Diagnosis not present

## 2019-05-06 LAB — FERRITIN: Ferritin: 538 ng/mL — ABNORMAL HIGH (ref 24–336)

## 2019-05-06 LAB — IRON AND TIBC
Iron: 107 ug/dL (ref 45–182)
Saturation Ratios: 42 % — ABNORMAL HIGH (ref 17.9–39.5)
TIBC: 255 ug/dL (ref 250–450)
UIBC: 148 ug/dL

## 2019-05-06 LAB — POCT HEMOGLOBIN-HEMACUE: Hemoglobin: 11.6 g/dL — ABNORMAL LOW (ref 13.0–17.0)

## 2019-05-06 MED ORDER — EPOETIN ALFA-EPBX 40000 UNIT/ML IJ SOLN
30000.0000 [IU] | INTRAMUSCULAR | Status: DC
  Administered 2019-05-06: 30000 [IU] via SUBCUTANEOUS
  Filled 2019-05-06: qty 3

## 2019-05-27 ENCOUNTER — Encounter (HOSPITAL_COMMUNITY)
Admission: RE | Admit: 2019-05-27 | Discharge: 2019-05-27 | Disposition: A | Discharge status: Home / Self Care | Payer: Medicare HMO | Source: Ambulatory Visit | Attending: Nephrology | Admitting: Nephrology

## 2019-05-27 ENCOUNTER — Other Ambulatory Visit: Payer: Self-pay

## 2019-05-27 VITALS — BP 158/67 | HR 73 | Temp 97.3°F | Resp 20

## 2019-05-27 DIAGNOSIS — N189 Chronic kidney disease, unspecified: Secondary | ICD-10-CM | POA: Insufficient documentation

## 2019-05-27 DIAGNOSIS — Z862 Personal history of diseases of the blood and blood-forming organs and certain disorders involving the immune mechanism: Secondary | ICD-10-CM

## 2019-05-27 LAB — POCT HEMOGLOBIN-HEMACUE: Hemoglobin: 11.2 g/dL — ABNORMAL LOW (ref 13.0–17.0)

## 2019-05-27 MED ORDER — EPOETIN ALFA-EPBX 40000 UNIT/ML IJ SOLN
30000.0000 [IU] | INTRAMUSCULAR | Status: DC
  Administered 2019-05-27: 30000 [IU] via SUBCUTANEOUS
  Filled 2019-05-27: qty 1

## 2019-06-03 DIAGNOSIS — G4733 Obstructive sleep apnea (adult) (pediatric): Secondary | ICD-10-CM | POA: Diagnosis not present

## 2019-06-17 ENCOUNTER — Ambulatory Visit (HOSPITAL_COMMUNITY)
Admission: RE | Admit: 2019-06-17 | Discharge: 2019-06-17 | Disposition: A | Discharge status: Home / Self Care | Payer: Medicare HMO | Source: Ambulatory Visit | Attending: Nephrology | Admitting: Nephrology

## 2019-06-17 ENCOUNTER — Other Ambulatory Visit: Payer: Self-pay

## 2019-06-17 VITALS — BP 154/67 | HR 67 | Temp 97.0°F | Resp 20

## 2019-06-17 DIAGNOSIS — Z862 Personal history of diseases of the blood and blood-forming organs and certain disorders involving the immune mechanism: Secondary | ICD-10-CM | POA: Diagnosis not present

## 2019-06-17 DIAGNOSIS — N189 Chronic kidney disease, unspecified: Secondary | ICD-10-CM | POA: Diagnosis not present

## 2019-06-17 LAB — FERRITIN: Ferritin: 436 ng/mL — ABNORMAL HIGH (ref 24–336)

## 2019-06-17 LAB — IRON AND TIBC
Iron: 98 ug/dL (ref 45–182)
Saturation Ratios: 39 % (ref 17.9–39.5)
TIBC: 249 ug/dL — ABNORMAL LOW (ref 250–450)
UIBC: 151 ug/dL

## 2019-06-17 LAB — POCT HEMOGLOBIN-HEMACUE: Hemoglobin: 11.6 g/dL — ABNORMAL LOW (ref 13.0–17.0)

## 2019-06-17 MED ORDER — EPOETIN ALFA-EPBX 40000 UNIT/ML IJ SOLN
30000.0000 [IU] | INTRAMUSCULAR | Status: DC
  Administered 2019-06-17: 30000 [IU] via SUBCUTANEOUS
  Filled 2019-06-17: qty 1

## 2019-06-19 DIAGNOSIS — E7849 Other hyperlipidemia: Secondary | ICD-10-CM | POA: Diagnosis not present

## 2019-06-19 DIAGNOSIS — M109 Gout, unspecified: Secondary | ICD-10-CM | POA: Diagnosis not present

## 2019-06-19 DIAGNOSIS — E1129 Type 2 diabetes mellitus with other diabetic kidney complication: Secondary | ICD-10-CM | POA: Diagnosis not present

## 2019-06-19 DIAGNOSIS — E038 Other specified hypothyroidism: Secondary | ICD-10-CM | POA: Diagnosis not present

## 2019-06-25 DIAGNOSIS — D631 Anemia in chronic kidney disease: Secondary | ICD-10-CM | POA: Diagnosis not present

## 2019-06-25 DIAGNOSIS — I130 Hypertensive heart and chronic kidney disease with heart failure and stage 1 through stage 4 chronic kidney disease, or unspecified chronic kidney disease: Secondary | ICD-10-CM | POA: Diagnosis not present

## 2019-06-25 DIAGNOSIS — N184 Chronic kidney disease, stage 4 (severe): Secondary | ICD-10-CM | POA: Diagnosis not present

## 2019-06-25 DIAGNOSIS — Q600 Renal agenesis, unilateral: Secondary | ICD-10-CM | POA: Diagnosis not present

## 2019-06-25 DIAGNOSIS — I129 Hypertensive chronic kidney disease with stage 1 through stage 4 chronic kidney disease, or unspecified chronic kidney disease: Secondary | ICD-10-CM | POA: Diagnosis not present

## 2019-06-25 DIAGNOSIS — Z23 Encounter for immunization: Secondary | ICD-10-CM | POA: Diagnosis not present

## 2019-06-25 DIAGNOSIS — N2581 Secondary hyperparathyroidism of renal origin: Secondary | ICD-10-CM | POA: Diagnosis not present

## 2019-06-25 DIAGNOSIS — R82998 Other abnormal findings in urine: Secondary | ICD-10-CM | POA: Diagnosis not present

## 2019-06-25 DIAGNOSIS — I770 Arteriovenous fistula, acquired: Secondary | ICD-10-CM | POA: Diagnosis not present

## 2019-06-26 DIAGNOSIS — D631 Anemia in chronic kidney disease: Secondary | ICD-10-CM | POA: Diagnosis not present

## 2019-06-26 DIAGNOSIS — I5032 Chronic diastolic (congestive) heart failure: Secondary | ICD-10-CM | POA: Diagnosis not present

## 2019-06-26 DIAGNOSIS — N184 Chronic kidney disease, stage 4 (severe): Secondary | ICD-10-CM | POA: Diagnosis not present

## 2019-06-26 DIAGNOSIS — I272 Pulmonary hypertension, unspecified: Secondary | ICD-10-CM | POA: Diagnosis not present

## 2019-06-26 DIAGNOSIS — E1129 Type 2 diabetes mellitus with other diabetic kidney complication: Secondary | ICD-10-CM | POA: Diagnosis not present

## 2019-06-26 DIAGNOSIS — Z0000 Encounter for general adult medical examination without abnormal findings: Secondary | ICD-10-CM | POA: Diagnosis not present

## 2019-06-26 DIAGNOSIS — I130 Hypertensive heart and chronic kidney disease with heart failure and stage 1 through stage 4 chronic kidney disease, or unspecified chronic kidney disease: Secondary | ICD-10-CM | POA: Diagnosis not present

## 2019-06-26 DIAGNOSIS — E039 Hypothyroidism, unspecified: Secondary | ICD-10-CM | POA: Diagnosis not present

## 2019-07-02 DIAGNOSIS — Z1212 Encounter for screening for malignant neoplasm of rectum: Secondary | ICD-10-CM | POA: Diagnosis not present

## 2019-07-03 ENCOUNTER — Ambulatory Visit (INDEPENDENT_AMBULATORY_CARE_PROVIDER_SITE_OTHER): Payer: Medicare HMO

## 2019-07-03 ENCOUNTER — Ambulatory Visit (INDEPENDENT_AMBULATORY_CARE_PROVIDER_SITE_OTHER): Payer: Medicare HMO | Admitting: Orthopaedic Surgery

## 2019-07-03 ENCOUNTER — Encounter: Payer: Self-pay | Admitting: Orthopaedic Surgery

## 2019-07-03 DIAGNOSIS — M79605 Pain in left leg: Secondary | ICD-10-CM

## 2019-07-03 NOTE — Progress Notes (Signed)
Office Visit Note   Patient: David Nguyen           Date of Birth: 03/05/1941           MRN: [id number] Visit Date: 07/03/2019              Requested by: [name], MD [address] [place],  [place] [address] PCP: [name], MD   Assessment & Plan: Visit Diagnoses:  1. Pain in left leg     Plan: Impression is chronic venous stasis left leg and left knee mass.  I have given him a prescription to [facility] for TED hose for compression treatment of the venous stasis.  We will obtain an MRI of the left knee to evaluate the mass.   Follow-up after the MRI.  Follow-Up Instructions: Return in about 2 weeks (around 07/17/2019).   Orders:  Orders Placed This Encounter  Procedures  . XR Tibia/Fibula Left  . XR Knee 1-2 Views Left  . MR Knee Left w/ contrast   No orders of the defined types were placed in this encounter.     Procedures: No procedures performed   Clinical Data: No additional findings.   Subjective: Chief Complaint  Patient presents with  . Left Leg - Pain    David Nguyen is a 77 year old gentleman comes in for evaluation of left leg pain and left knee mass.  He denies any injuries.  He states that the left leg pain is worse with standing and walking.  He is also complaining of a painless mass on the medial side of his left knee.  Denies any injuries.  He does endorse swelling in his left leg.   Review of Systems  Constitutional: Negative.   All other systems reviewed and are negative.    Objective: Vital Signs: There were no vitals taken for this visit.  Physical Exam Vitals signs and nursing note reviewed.  Constitutional:      Appearance: He is well-developed.  HENT:     Head: Normocephalic and atraumatic.  Eyes:     Pupils: Pupils are equal, round, and reactive to light.  Neck:     Musculoskeletal: Neck supple.  Pulmonary:     Effort: Pulmonary effort is normal.  Abdominal:     Palpations: Abdomen is soft.   Musculoskeletal: Normal range of motion.  Skin:    General: Skin is warm.  Neurological:     Mental Status: He is alert and oriented to person, place, and time.  Psychiatric:        Behavior: Behavior normal.        Thought Content: Thought content normal.        Judgment: Judgment normal.     Ortho Exam Left knee exam shows no joint effusion.  There is a palpable firm mass on the medial aspect of the knee.  Questionable bruise versus skin discoloration.  Left lower leg exam shows skin changes consistent with chronic venous stasis.  He has decent pulses distally.  There is no drainage from this leg. Specialty Comments:  No specialty comments available.  Imaging: Xr Knee 1-2 Views Left  Result Date: 07/03/2019 Mild to moderate osteoarthritis  Xr Tibia/fibula Left  Result Date: 07/03/2019 No acute or structural abnormalities.     PMFS History: Patient Active Problem List   Diagnosis Date Noted  . Chronic diastolic CHF (congestive heart failure) ([place]) 04/04/2019  . Pulmonary hypertension ([place]) 04/04/2019  . Sinus tachycardia 04/04/2019  . CKD (chronic kidney disease),  stage V ([place]) 04/04/2019  . Diabetes mellitus type 2, controlled, with complications ([place]) 56/38/7564  . Anemia of chronic disease 04/04/2019  . Obesity, diabetes, and hypertension syndrome ([place]) 04/04/2019  . Horseshoe kidney   . Bradycardia 03/31/2019  . Hypothyroidism 03/31/2019  . Hyperkalemia 03/31/2019  . CKD stage 4 due to type 2 diabetes mellitus ([place]) 04/15/2018  . History of anemia due to CKD 04/15/2018  . Morbid obesity due to excess calories ([place]) 07/13/2017  . DIABETES, TYPE 2 05/26/2008  . HYPERLIPIDEMIA 05/26/2008  . Essential hypertension 05/26/2008  . Allergic rhinitis 05/26/2008  . Sleep apnea 05/26/2008   Past Medical History:  Diagnosis Date  . Allergic rhinitis   . Anemia    low iron  . Bradycardia 03/2019  . Chronic kidney disease    Followed by Dr Deterding    . Diabetes  mellitus    Type II  . Horseshoe kidney   . Hyperlipidemia   . Hypertension   . Hypothyroidism   . Sleep apnea    uses a cpap    Family History  Problem Relation Age of Onset  . Prostate cancer Father   . Kidney failure Mother   . Lung cancer Sister     Past Surgical History:  Procedure Laterality Date  . ADRENALECTOMY  1993   left  . AV FISTULA PLACEMENT Left 05/06/2018   Procedure: ARTERIOVENOUS (AV) FISTULA CREATION RADIOCEPHALIC;  Surgeon: [name], MD;  Location: [facility];  Service: Vascular;  Laterality: Left;  . COLONOSCOPY W/ POLYPECTOMY    . REVISION OF ARTERIOVENOUS GORETEX GRAFT Left 07/11/2018   Procedure: TRANSPOSITION OF RADIOCEPHALIC  ARTERIOVENOUS FISTULA LEFT ARM;  Surgeon: [name], MD;  Location: [facility];  Service: Vascular;  Laterality: Left;   Social History   Occupational History    Comment: [employer]  Tobacco Use  . Smoking status: Former Smoker    Packs/day: 0.30    Years: 3.00    Pack years: 0.90    Types: Cigarettes    Quit date: 10/09/1968    Years since quitting: 50.7  . Smokeless tobacco: Never Used  Substance and Sexual Activity  . Alcohol use: No  . Drug use: No  . Sexual activity: Not on file

## 2019-07-08 ENCOUNTER — Other Ambulatory Visit: Payer: Self-pay

## 2019-07-08 ENCOUNTER — Ambulatory Visit (HOSPITAL_COMMUNITY)
Admission: RE | Admit: 2019-07-08 | Discharge: 2019-07-08 | Disposition: A | Discharge status: Home / Self Care | Payer: Medicare HMO | Source: Ambulatory Visit | Attending: Nephrology | Admitting: Nephrology

## 2019-07-08 VITALS — BP 141/57 | HR 67 | Temp 97.0°F | Resp 20

## 2019-07-08 DIAGNOSIS — Z862 Personal history of diseases of the blood and blood-forming organs and certain disorders involving the immune mechanism: Secondary | ICD-10-CM

## 2019-07-08 DIAGNOSIS — N189 Chronic kidney disease, unspecified: Secondary | ICD-10-CM | POA: Diagnosis not present

## 2019-07-08 LAB — POCT HEMOGLOBIN-HEMACUE: Hemoglobin: 12.1 g/dL — ABNORMAL LOW (ref 13.0–17.0)

## 2019-07-08 MED ORDER — EPOETIN ALFA-EPBX 40000 UNIT/ML IJ SOLN: 30000.0000 [IU] | INTRAMUSCULAR | Status: DC

## 2019-07-14 ENCOUNTER — Other Ambulatory Visit: Payer: Self-pay

## 2019-07-14 DIAGNOSIS — N184 Chronic kidney disease, stage 4 (severe): Secondary | ICD-10-CM

## 2019-07-14 DIAGNOSIS — E1122 Type 2 diabetes mellitus with diabetic chronic kidney disease: Secondary | ICD-10-CM

## 2019-07-16 ENCOUNTER — Other Ambulatory Visit: Payer: Self-pay

## 2019-07-16 ENCOUNTER — Telehealth (HOSPITAL_COMMUNITY): Payer: Self-pay

## 2019-07-16 DIAGNOSIS — N185 Chronic kidney disease, stage 5: Secondary | ICD-10-CM

## 2019-07-16 NOTE — Telephone Encounter (Signed)
The above patient or their representative was contacted and gave the following answers to these questions:         Do you have any of the following symptoms?no  Fever                    Cough                   Shortness of breath  Do  you have any of the following other symptoms?    muscle pain         vomiting,        diarrhea        rash         weakness        red eye        abdominal pain         bruising          bruising or bleeding              joint pain           severe headache    Have you been in contact with someone who was or has been sick in the past 2 weeks?no  Yes                 Unsure                         Unable to assess   Does the person that you were in contact with have any of the following symptoms?   Cough         shortness of breath           muscle pain         vomiting,            diarrhea            rash            weakness           fever            red eye           abdominal pain           bruising  or  bleeding                joint pain                severe headache                COMMENTS OR ACTION PLAN FOR THIS PATIENT:

## 2019-07-17 ENCOUNTER — Ambulatory Visit: Payer: Medicare HMO | Admitting: Orthopaedic Surgery

## 2019-07-17 ENCOUNTER — Ambulatory Visit (INDEPENDENT_AMBULATORY_CARE_PROVIDER_SITE_OTHER): Payer: Medicare HMO | Admitting: Physician Assistant

## 2019-07-17 ENCOUNTER — Ambulatory Visit (HOSPITAL_COMMUNITY)
Admission: RE | Admit: 2019-07-17 | Discharge: 2019-07-17 | Disposition: A | Discharge status: Home / Self Care | Payer: Medicare HMO | Source: Ambulatory Visit | Attending: Vascular Surgery | Admitting: Vascular Surgery

## 2019-07-17 ENCOUNTER — Other Ambulatory Visit: Payer: Self-pay

## 2019-07-17 VITALS — BP 133/69 | HR 70 | Resp 17 | Ht 72.0 in | Wt 260.0 lb

## 2019-07-17 DIAGNOSIS — N185 Chronic kidney disease, stage 5: Secondary | ICD-10-CM | POA: Diagnosis not present

## 2019-07-17 DIAGNOSIS — J309 Allergic rhinitis, unspecified: Secondary | ICD-10-CM | POA: Diagnosis not present

## 2019-07-17 NOTE — Progress Notes (Signed)
[facility] [place] HISTORY AND PHYSICAL   History of Present Illness:  Patient is a 78 y.o. year old male who presents for placement for examination of his  permanent hemodialysis access. He is not yet on HD.  He denise pain, numbness or loss of motor.    Dr. Donnetta Hutching performed RC fistula superficialiization on 07/11/2018.  Past Medical History:  Diagnosis Date  . Allergic rhinitis   . Anemia    low iron  . Bradycardia 03/2019  . Chronic kidney disease    Followed by Dr Deterding    . Diabetes mellitus    Type II  . Horseshoe kidney   . Hyperlipidemia   . Hypertension   . Hypothyroidism   . Sleep apnea    uses a cpap    Past Surgical History:  Procedure Laterality Date  . ADRENALECTOMY  1993   left  . AV FISTULA PLACEMENT Left 05/06/2018   Procedure: ARTERIOVENOUS (AV) FISTULA CREATION RADIOCEPHALIC;  Surgeon: [name], MD;  Location: [facility];  Service: Vascular;  Laterality: Left;  . COLONOSCOPY W/ POLYPECTOMY    . REVISION OF ARTERIOVENOUS GORETEX GRAFT Left 07/11/2018   Procedure: TRANSPOSITION OF RADIOCEPHALIC  ARTERIOVENOUS FISTULA LEFT ARM;  Surgeon: [name], MD;  Location[facility];  Service: Vascular;  Laterality: Left;     Social History Social History   Tobacco Use  . Smoking status: Former Smoker    Packs/day: 0.30    Years: 3.00    Pack years: 0.90    Types: Cigarettes    Quit date: 10/09/1968    Years since quitting: 50.8  . Smokeless tobacco: Never Used  Substance Use Topics  . Alcohol use: No  . Drug use: No    Family History Family History  Problem Relation Age of Onset  . Prostate cancer Father   . Kidney failure Mother   . Lung cancer Sister     Allergies  Allergies  Allergen Reactions  . Penicillins Other (See Comments)    UNSPECIFIED REACTION  Has patient had a PCN reaction causing immediate rash, facial/tongue/throat swelling, SOB or lightheadedness with hypotension: Unknown Has patient had a PCN reaction  causing severe rash involving mucus membranes or skin necrosis: Unknown Has patient had a PCN reaction that required hospitalization: Unknown Has patient had a PCN reaction occurring within the last 10 years: No If all of the above answers are "NO", then may proceed with Cephalosporin use.      Current Outpatient Medications  Medication Sig Dispense Refill  . allopurinol (ZYLOPRIM) 100 MG tablet Take 100 mg by mouth 2 (two) times daily.     Marland Kitchen amLODipine (NORVASC) 10 MG tablet Take 10 mg by mouth at bedtime.     Marland Kitchen aspirin EC 81 MG tablet Take 81 mg by mouth daily.    Marland Kitchen atorvastatin (LIPITOR) 40 MG tablet Take 40 mg by mouth at bedtime.     . calcitRIOL (ROCALTROL) 0.25 MCG capsule Take 0.25 mcg by mouth daily.     . carvedilol (COREG) 3.125 MG tablet Take 1 tablet (3.125 mg total) by mouth 2 (two) times daily with a meal. 60 tablet 0  . doxazosin (CARDURA) 8 MG tablet Take 8 mg by mouth at bedtime.      . fluticasone (FLONASE) 50 MCG/ACT nasal spray Place 2 sprays into both nostrils daily.   11  . furosemide (LASIX) 80 MG tablet Take 1 tablet (80 mg total) by mouth daily. 30 tablet 0  .  Homeopathic Products [facility] ALLERGY EYE RELIEF) SOLN Place 1-2 drops into both eyes daily as needed (for dry eyes).    . Insulin Isophane & Regular Human (HUMULIN 70/30 MIX) (70-30) 100 UNIT/ML PEN Inject 10 units subcutaneously before breakfast and inject 10 units subcutaneously before supper 15 mL 11  . levothyroxine (SYNTHROID, LEVOTHROID) 75 MCG tablet Take 75 mcg by mouth daily before breakfast.    . montelukast (SINGULAIR) 10 MG tablet Take 10 mg by mouth daily.     . Multiple Vitamin (MULTIVITAMIN WITH MINERALS) TABS tablet Take 1 tablet by mouth daily.    Marland Kitchen PRESCRIPTION MEDICATION Inhale into the lungs at bedtime. CPAP    . testosterone cypionate (DEPOTESTOSTERONE CYPIONATE) 200 MG/ML injection Inject 200 mg into the muscle every 21 ( twenty-one) days.     No current facility-administered  medications for this visit.     ROS:   General:  No weight loss, Fever, chills  HEENT: No recent headaches, no nasal bleeding, no visual changes, no sore throat  Neurologic: No dizziness, blackouts, seizures. No recent symptoms of stroke or mini- stroke. No recent episodes of slurred speech, or temporary blindness.  Cardiac: No recent episodes of chest pain/pressure, no shortness of breath at rest.  No shortness of breath with exertion.  Denies history of atrial fibrillation or irregular heartbeat  Vascular: No history of rest pain in feet.  No history of claudication.  No history of non-healing ulcer, No history of DVT   Pulmonary: No home oxygen, no productive cough, no hemoptysis,  No asthma or wheezing  Musculoskeletal:  [ ]  Arthritis, [ ]  Low back pain,  [ ]  Joint pain  Hematologic:No history of hypercoagulable state.  No history of easy bleeding.  No history of anemia  Gastrointestinal: No hematochezia or melena,  No gastroesophageal reflux, no trouble swallowing  Urinary: [x ] chronic Kidney disease, [ ]  on HD - [ ]  MWF or [ ]  TTHS, [ ]  Burning with urination, [ ]  Frequent urination, [ ]  Difficulty urinating;   Skin: No rashes  Psychological: No history of anxiety,  No history of depression   Physical Examination  Vitals:   07/17/19 1548  BP: 133/69  Pulse: 70  Resp: 17  SpO2: 99%  Weight: 260 lb (117.9 kg)  Height: 6' (1.829 m)    Body mass index is 35.26 kg/m.  General:  Alert and oriented, no acute distress HEENT: Normal Neck: No bruit or JVD Pulmonary: Clear to auscultation bilaterally Cardiac: Regular Rate and Rhythm without murmur Gastrointestinal: Soft, non-tender, non-distended, no mass, no scars Skin: No rash Extremity Pulses:  2+ radial, brachial pulses bilaterally. Visible palpable left RC AV fistula. Musculoskeletal: No deformity or edema  Neurologic: Upper and lower extremity motor 5/5 and symmetric  DATA:   +------------+---------------+----------------------+----------+------------+ OUTFLOW VEIN  PSV (cm/s)       Diameter (cm)     Depth (cm)  Describe   +------------+---------------+----------------------+----------+------------+ AC Fossa          62                0.55            0.42   branch 0.415 +------------+---------------+----------------------+----------+------------+ Prox Forearm      62                0.61            0.56                +------------+---------------+----------------------+----------+------------+ Mid Forearm  90                0.57            0.22                +------------+---------------+----------------------+----------+------------+ Dist Forearm358>155>426>2810.32>0.475>0.197>0.525   0.28                +------------+---------------+----------------------+----------+------------+    ASSESSMENT:  CKD    PLAN: The diameter is acceptable and the depth over all as well.  I reviewed the study with Dr. Oneida Alar.  He advised him to exercise the left hand to help increase the flow in the fistula on a daily basis.  He is not yet on HD.    Dr. Oneida Alar states that if he needs HD in the future it can be accessed.  If there are problems once HD is started we will address those issue.  Roxy Horseman PA-C [facility] [place] Office: [phone]  MD in clinic [place]

## 2019-07-22 ENCOUNTER — Other Ambulatory Visit: Payer: Self-pay

## 2019-07-22 ENCOUNTER — Ambulatory Visit (HOSPITAL_COMMUNITY)
Admission: RE | Admit: 2019-07-22 | Discharge: 2019-07-22 | Disposition: A | Discharge status: Home / Self Care | Payer: Medicare HMO | Source: Ambulatory Visit | Attending: Nephrology | Admitting: Nephrology

## 2019-07-22 VITALS — BP 160/64 | HR 77 | Temp 97.1°F | Resp 18

## 2019-07-22 DIAGNOSIS — Z862 Personal history of diseases of the blood and blood-forming organs and certain disorders involving the immune mechanism: Secondary | ICD-10-CM | POA: Insufficient documentation

## 2019-07-22 DIAGNOSIS — N189 Chronic kidney disease, unspecified: Secondary | ICD-10-CM | POA: Diagnosis not present

## 2019-07-22 LAB — IRON AND TIBC
Iron: 108 ug/dL (ref 45–182)
Saturation Ratios: 40 % — ABNORMAL HIGH (ref 17.9–39.5)
TIBC: 272 ug/dL (ref 250–450)
UIBC: 164 ug/dL

## 2019-07-22 LAB — POCT HEMOGLOBIN-HEMACUE: Hemoglobin: 11.6 g/dL — ABNORMAL LOW (ref 13.0–17.0)

## 2019-07-22 LAB — FERRITIN: Ferritin: 388 ng/mL — ABNORMAL HIGH (ref 24–336)

## 2019-07-22 MED ORDER — EPOETIN ALFA-EPBX 40000 UNIT/ML IJ SOLN
30000.0000 [IU] | INTRAMUSCULAR | Status: DC
  Administered 2019-07-22: 30000 [IU] via SUBCUTANEOUS
  Filled 2019-07-22: qty 1

## 2019-07-29 ENCOUNTER — Encounter (HOSPITAL_COMMUNITY): Payer: Medicare HMO

## 2019-08-03 ENCOUNTER — Other Ambulatory Visit: Payer: Self-pay

## 2019-08-03 ENCOUNTER — Other Ambulatory Visit: Payer: Self-pay | Admitting: Orthopaedic Surgery

## 2019-08-03 ENCOUNTER — Ambulatory Visit
Admission: RE | Admit: 2019-08-03 | Discharge: 2019-08-03 | Disposition: A | Discharge status: Home / Self Care | Payer: Medicare HMO | Source: Ambulatory Visit | Attending: Orthopaedic Surgery | Admitting: Orthopaedic Surgery

## 2019-08-03 DIAGNOSIS — M79605 Pain in left leg: Secondary | ICD-10-CM

## 2019-08-03 DIAGNOSIS — D1724 Benign lipomatous neoplasm of skin and subcutaneous tissue of left leg: Secondary | ICD-10-CM | POA: Diagnosis not present

## 2019-08-03 DIAGNOSIS — M23362 Other meniscus derangements, other lateral meniscus, left knee: Secondary | ICD-10-CM | POA: Diagnosis not present

## 2019-08-06 ENCOUNTER — Ambulatory Visit: Payer: Medicare HMO | Admitting: Orthopaedic Surgery

## 2019-08-08 DIAGNOSIS — I770 Arteriovenous fistula, acquired: Secondary | ICD-10-CM | POA: Diagnosis not present

## 2019-08-08 DIAGNOSIS — N184 Chronic kidney disease, stage 4 (severe): Secondary | ICD-10-CM | POA: Diagnosis not present

## 2019-08-08 DIAGNOSIS — I129 Hypertensive chronic kidney disease with stage 1 through stage 4 chronic kidney disease, or unspecified chronic kidney disease: Secondary | ICD-10-CM | POA: Diagnosis not present

## 2019-08-08 DIAGNOSIS — M109 Gout, unspecified: Secondary | ICD-10-CM | POA: Diagnosis not present

## 2019-08-08 DIAGNOSIS — N2581 Secondary hyperparathyroidism of renal origin: Secondary | ICD-10-CM | POA: Diagnosis not present

## 2019-08-08 DIAGNOSIS — D631 Anemia in chronic kidney disease: Secondary | ICD-10-CM | POA: Diagnosis not present

## 2019-08-12 ENCOUNTER — Encounter (HOSPITAL_COMMUNITY)
Admission: RE | Admit: 2019-08-12 | Discharge: 2019-08-12 | Disposition: A | Discharge status: Home / Self Care | Payer: Medicare HMO | Source: Ambulatory Visit | Attending: Nephrology | Admitting: Nephrology

## 2019-08-12 ENCOUNTER — Other Ambulatory Visit: Payer: Self-pay

## 2019-08-12 VITALS — BP 155/77 | HR 72 | Temp 96.7°F | Resp 20

## 2019-08-12 DIAGNOSIS — N189 Chronic kidney disease, unspecified: Secondary | ICD-10-CM | POA: Insufficient documentation

## 2019-08-12 DIAGNOSIS — Z862 Personal history of diseases of the blood and blood-forming organs and certain disorders involving the immune mechanism: Secondary | ICD-10-CM | POA: Diagnosis not present

## 2019-08-12 LAB — POCT HEMOGLOBIN-HEMACUE: Hemoglobin: 12.2 g/dL — ABNORMAL LOW (ref 13.0–17.0)

## 2019-08-12 MED ORDER — EPOETIN ALFA-EPBX 40000 UNIT/ML IJ SOLN
30000.0000 [IU] | INTRAMUSCULAR | Status: DC
  Filled 2019-08-12: qty 1

## 2019-08-14 ENCOUNTER — Ambulatory Visit (INDEPENDENT_AMBULATORY_CARE_PROVIDER_SITE_OTHER): Payer: Medicare HMO | Admitting: Orthopaedic Surgery

## 2019-08-14 ENCOUNTER — Other Ambulatory Visit: Payer: Self-pay

## 2019-08-14 ENCOUNTER — Encounter: Payer: Self-pay | Admitting: Orthopaedic Surgery

## 2019-08-14 DIAGNOSIS — M1712 Unilateral primary osteoarthritis, left knee: Secondary | ICD-10-CM | POA: Diagnosis not present

## 2019-08-14 MED ORDER — LIDOCAINE HCL 1 % IJ SOLN
2.0000 mL | INTRAMUSCULAR | Status: AC | PRN
  Administered 2019-08-14: 09:00:00 2 mL

## 2019-08-14 MED ORDER — METHYLPREDNISOLONE ACETATE 40 MG/ML IJ SUSP
40.0000 mg | INTRAMUSCULAR | Status: AC | PRN
  Administered 2019-08-14: 40 mg via INTRA_ARTICULAR

## 2019-08-14 MED ORDER — BUPIVACAINE HCL 0.5 % IJ SOLN
2.0000 mL | INTRAMUSCULAR | Status: AC | PRN
  Administered 2019-08-14: 2 mL via INTRA_ARTICULAR

## 2019-08-14 NOTE — Progress Notes (Signed)
Office Visit Note   Patient: David Nguyen           Date of Birth: 03/27/1941           MRN: [id number] Visit Date: 08/14/2019              Requested by: [name], MD [address] [place],  [place] [address] PCP: [name], MD   Assessment & Plan: Visit Diagnoses:  1. Primary osteoarthritis of left knee     Plan: MRI of the left knee shows advanced tricompartmental degenerative joint changes worst in the lateral compartment with a complex tear of the lateral meniscus.  Based on discussion today we decided to proceed with a cortisone injection to see if this will help with his symptoms.  Patient will follow up if he does not notice any improvement.  Questions encouraged and answered.  Follow-Up Instructions: Return if symptoms worsen or fail to improve.   Orders:  No orders of the defined types were placed in this encounter.  No orders of the defined types were placed in this encounter.     Procedures: Large Joint Inj: L knee on 08/14/2019 8:32 AM Details: 22 G needle Medications: 2 mL bupivacaine 0.5 %; 2 mL lidocaine 1 %; 40 mg methylPREDNISolone acetate 40 MG/ML Outcome: tolerated well, no immediate complications Patient was prepped and draped in the usual sterile fashion.       Clinical Data: No additional findings.   Subjective: Chief Complaint  Patient presents with  . Left Knee - Pain    Mr. David Nguyen returns today for MRI review of his left knee.  His pain is no different.  He radiates down into the shin area.  He is a relatively poor historian.   Review of Systems   Objective: Vital Signs: There were no vitals taken for this visit.  Physical Exam  Ortho Exam Left knee exam is unchanged. Specialty Comments:  No specialty comments available.  Imaging: No results found.   PMFS History: Patient Active Problem List   Diagnosis Date Noted  . Primary osteoarthritis of left knee 08/14/2019  . Chronic diastolic CHF (congestive  heart failure) ([place]) 04/04/2019  . Pulmonary hypertension ([place]) 04/04/2019  . Sinus tachycardia 04/04/2019  . CKD (chronic kidney disease), stage V ([place]) 04/04/2019  . Diabetes mellitus type 2, controlled, with complications ([place]) 53/29/9242  . Anemia of chronic disease 04/04/2019  . Obesity, diabetes, and hypertension syndrome ([place]) 04/04/2019  . Horseshoe kidney   . Bradycardia 03/31/2019  . Hypothyroidism 03/31/2019  . Hyperkalemia 03/31/2019  . CKD stage 4 due to type 2 diabetes mellitus ([place]) 04/15/2018  . History of anemia due to CKD 04/15/2018  . Morbid obesity due to excess calories ([place]) 07/13/2017  . DIABETES, TYPE 2 05/26/2008  . HYPERLIPIDEMIA 05/26/2008  . Essential hypertension 05/26/2008  . Allergic rhinitis 05/26/2008  . Sleep apnea 05/26/2008   Past Medical History:  Diagnosis Date  . Allergic rhinitis   . Anemia    low iron  . Bradycardia 03/2019  . Chronic kidney disease    Followed by Dr Deterding    . Diabetes mellitus    Type II  . Horseshoe kidney   . Hyperlipidemia   . Hypertension   . Hypothyroidism   . Sleep apnea    uses a cpap    Family History  Problem Relation Age of Onset  . Prostate cancer Father   . Kidney failure Mother   . Lung cancer Sister  Past Surgical History:  Procedure Laterality Date  . ADRENALECTOMY  1993   left  . AV FISTULA PLACEMENT Left 05/06/2018   Procedure: ARTERIOVENOUS (AV) FISTULA CREATION RADIOCEPHALIC;  Surgeon: [name], MD;  Location: [facility];  Service: Vascular;  Laterality: Left;  . COLONOSCOPY W/ POLYPECTOMY    . REVISION OF ARTERIOVENOUS GORETEX GRAFT Left 07/11/2018   Procedure: TRANSPOSITION OF RADIOCEPHALIC  ARTERIOVENOUS FISTULA LEFT ARM;  Surgeon: [name], MD;  Location[facility];  Service: Vascular;  Laterality: Left;   Social History   Occupational History    Comment: [employer]  Tobacco Use  . Smoking status: Former Smoker    Packs/day: 0.30    Years: 3.00    Pack years: 0.90     Types: Cigarettes    Quit date: 10/09/1968    Years since quitting: 50.8  . Smokeless tobacco: Never Used  Substance and Sexual Activity  . Alcohol use: No  . Drug use: No  . Sexual activity: Not on file

## 2019-08-26 ENCOUNTER — Other Ambulatory Visit: Payer: Self-pay

## 2019-08-26 ENCOUNTER — Encounter (HOSPITAL_COMMUNITY)
Admission: RE | Admit: 2019-08-26 | Discharge: 2019-08-26 | Disposition: A | Discharge status: Home / Self Care | Payer: Medicare HMO | Source: Ambulatory Visit | Attending: Nephrology | Admitting: Nephrology

## 2019-08-26 VITALS — BP 163/71 | HR 69 | Temp 97.3°F | Resp 20

## 2019-08-26 DIAGNOSIS — Z862 Personal history of diseases of the blood and blood-forming organs and certain disorders involving the immune mechanism: Secondary | ICD-10-CM

## 2019-08-26 DIAGNOSIS — N189 Chronic kidney disease, unspecified: Secondary | ICD-10-CM

## 2019-08-26 LAB — POCT HEMOGLOBIN-HEMACUE: Hemoglobin: 12.4 g/dL — ABNORMAL LOW (ref 13.0–17.0)

## 2019-08-26 LAB — IRON AND TIBC
Iron: 118 ug/dL (ref 45–182)
Saturation Ratios: 43 % — ABNORMAL HIGH (ref 17.9–39.5)
TIBC: 272 ug/dL (ref 250–450)
UIBC: 154 ug/dL

## 2019-08-26 LAB — FERRITIN: Ferritin: 353 ng/mL — ABNORMAL HIGH (ref 24–336)

## 2019-08-26 MED ORDER — EPOETIN ALFA-EPBX 10000 UNIT/ML IJ SOLN
30000.0000 [IU] | Freq: Once | INTRAMUSCULAR | Status: DC
  Filled 2019-08-26: qty 3

## 2019-08-26 MED ORDER — EPOETIN ALFA-EPBX 40000 UNIT/ML IJ SOLN: 30000.0000 [IU] | INTRAMUSCULAR | Status: DC

## 2019-09-02 ENCOUNTER — Encounter (HOSPITAL_COMMUNITY): Payer: Medicare HMO

## 2019-09-09 ENCOUNTER — Other Ambulatory Visit: Payer: Self-pay

## 2019-09-09 ENCOUNTER — Encounter (HOSPITAL_COMMUNITY)
Admission: RE | Admit: 2019-09-09 | Discharge: 2019-09-09 | Disposition: A | Discharge status: Home / Self Care | Payer: Medicare HMO | Source: Ambulatory Visit | Attending: Nephrology | Admitting: Nephrology

## 2019-09-09 VITALS — BP 144/61 | HR 68 | Temp 96.5°F | Resp 18 | Ht 72.0 in | Wt 260.0 lb

## 2019-09-09 DIAGNOSIS — N189 Chronic kidney disease, unspecified: Secondary | ICD-10-CM | POA: Insufficient documentation

## 2019-09-09 DIAGNOSIS — Z862 Personal history of diseases of the blood and blood-forming organs and certain disorders involving the immune mechanism: Secondary | ICD-10-CM | POA: Insufficient documentation

## 2019-09-09 LAB — POCT HEMOGLOBIN-HEMACUE: Hemoglobin: 13 g/dL (ref 13.0–17.0)

## 2019-09-09 MED ORDER — EPOETIN ALFA-EPBX 10000 UNIT/ML IJ SOLN
30000.0000 [IU] | INTRAMUSCULAR | Status: DC
  Filled 2019-09-09: qty 3

## 2019-09-16 ENCOUNTER — Encounter (HOSPITAL_COMMUNITY): Payer: Medicare HMO

## 2019-09-17 ENCOUNTER — Encounter: Payer: Self-pay | Admitting: Adult Health

## 2019-09-17 ENCOUNTER — Ambulatory Visit (INDEPENDENT_AMBULATORY_CARE_PROVIDER_SITE_OTHER): Payer: Medicare HMO | Admitting: Adult Health

## 2019-09-17 DIAGNOSIS — G4733 Obstructive sleep apnea (adult) (pediatric): Secondary | ICD-10-CM

## 2019-09-17 NOTE — Progress Notes (Signed)
Virtual Visit via Telephone Note  I connected with David Nguyen on 09/17/19 at  9:00 AM EST by telephone and verified that I am speaking with the correct person using two identifiers.  Location: Patient: Home  Provider: Office   I discussed the limitations, risks, security and privacy concerns of performing an evaluation and management service by telephone and the availability of in person appointments. I also discussed with the patient that there may be a patient responsible charge related to this service. The patient expressed understanding and agreed to proceed.   History of Present Illness: 78 year old African-American male followed for obstructive sleep apnea Medical history significant CHF, HTN , CKD , DM   Today's televisit is a 1 year follow-up for sleep apnea.  Patient is on nocturnal CPAP.  Patient says he is doing well.  Wears his CPAP around 6 to 7 hours each night.  He feels rested when he awakens.  No significant daytime sleepiness.  Has been using a nasal mask but wants to change back to his full facemask.  CPAP download shows excellent compliance with daily average usage at 6 hours.  Patient is on CPAP 15 cm H2O.  AHI 2.2.  Positive leaks.     Observations/Objective:  PSG 04/2008 >>severe obstructive sleep apnea with  hypopneas , AHI 63/h, lowest desatn of 78%, longest event 52s. nocturnal cough noted. Improved with CPAP 15 cm    Assessment and Plan: Obstructive sleep apnea well-controlled with good compliance on CPAP Changed back to full facemask per request  Plan  Patient Instructions  Continue on C Pap at bedtime Work on healthy weight loss.  Do not drive if  sleepy Follow up with Dr. Elsworth Soho  In 1 year and As needed         Follow Up Instructions: Follow-up in 1 year with Dr. Elsworth Soho and as needed   I discussed the assessment and treatment plan with the patient. The patient was provided an opportunity to ask questions and all were answered. The patient agreed  with the plan and demonstrated an understanding of the instructions.   The patient was advised to call back or seek an in-person evaluation if the symptoms worsen or if the condition fails to improve as anticipated.  I provided 22 minutes of non-face-to-face time during this encounter.   Rexene Edison, NP

## 2019-09-17 NOTE — Addendum Note (Signed)
Addended by: Parke Poisson E on: 09/17/2019 09:54 AM   Modules accepted: Orders

## 2019-09-17 NOTE — Patient Instructions (Addendum)
Continue on C Pap at bedtime Work on healthy weight loss.  Do not drive if  sleepy Follow up with Dr. Elsworth Soho  In 1 year and As needed    Prescription for new air fit F 30 fullface mask

## 2019-09-25 DIAGNOSIS — N184 Chronic kidney disease, stage 4 (severe): Secondary | ICD-10-CM | POA: Diagnosis not present

## 2019-09-25 DIAGNOSIS — I129 Hypertensive chronic kidney disease with stage 1 through stage 4 chronic kidney disease, or unspecified chronic kidney disease: Secondary | ICD-10-CM | POA: Diagnosis not present

## 2019-09-25 DIAGNOSIS — E1129 Type 2 diabetes mellitus with other diabetic kidney complication: Secondary | ICD-10-CM | POA: Diagnosis not present

## 2019-09-30 ENCOUNTER — Other Ambulatory Visit: Payer: Self-pay

## 2019-09-30 ENCOUNTER — Ambulatory Visit (HOSPITAL_COMMUNITY)
Admission: RE | Admit: 2019-09-30 | Discharge: 2019-09-30 | Disposition: A | Discharge status: Home / Self Care | Payer: Medicare HMO | Source: Ambulatory Visit | Attending: Nephrology | Admitting: Nephrology

## 2019-09-30 VITALS — BP 163/70 | HR 78 | Temp 96.8°F | Resp 20

## 2019-09-30 DIAGNOSIS — Z862 Personal history of diseases of the blood and blood-forming organs and certain disorders involving the immune mechanism: Secondary | ICD-10-CM | POA: Diagnosis not present

## 2019-09-30 DIAGNOSIS — N189 Chronic kidney disease, unspecified: Secondary | ICD-10-CM | POA: Insufficient documentation

## 2019-09-30 LAB — POCT HEMOGLOBIN-HEMACUE: Hemoglobin: 10.9 g/dL — ABNORMAL LOW (ref 13.0–17.0)

## 2019-09-30 LAB — FERRITIN: Ferritin: 446 ng/mL — ABNORMAL HIGH (ref 24–336)

## 2019-09-30 LAB — IRON AND TIBC
Iron: 98 ug/dL (ref 45–182)
Saturation Ratios: 38 % (ref 17.9–39.5)
TIBC: 256 ug/dL (ref 250–450)
UIBC: 158 ug/dL

## 2019-09-30 MED ORDER — EPOETIN ALFA-EPBX 10000 UNIT/ML IJ SOLN
INTRAMUSCULAR | Status: AC
  Filled 2019-09-30: qty 3

## 2019-09-30 MED ORDER — EPOETIN ALFA-EPBX 10000 UNIT/ML IJ SOLN
30000.0000 [IU] | INTRAMUSCULAR | Status: DC
  Administered 2019-09-30: 30000 [IU] via SUBCUTANEOUS

## 2019-10-17 DIAGNOSIS — G4733 Obstructive sleep apnea (adult) (pediatric): Secondary | ICD-10-CM | POA: Diagnosis not present

## 2019-10-21 ENCOUNTER — Ambulatory Visit (HOSPITAL_COMMUNITY)
Admission: RE | Admit: 2019-10-21 | Discharge: 2019-10-21 | Disposition: A | Discharge status: Home / Self Care | Payer: Medicare HMO | Source: Ambulatory Visit | Attending: Nephrology | Admitting: Nephrology

## 2019-10-21 ENCOUNTER — Other Ambulatory Visit: Payer: Self-pay

## 2019-10-21 VITALS — BP 148/78 | HR 75 | Temp 97.1°F | Resp 20

## 2019-10-21 DIAGNOSIS — N189 Chronic kidney disease, unspecified: Secondary | ICD-10-CM | POA: Diagnosis not present

## 2019-10-21 DIAGNOSIS — Z862 Personal history of diseases of the blood and blood-forming organs and certain disorders involving the immune mechanism: Secondary | ICD-10-CM

## 2019-10-21 LAB — IRON AND TIBC
Iron: 93 ug/dL (ref 45–182)
Saturation Ratios: 36 % (ref 17.9–39.5)
TIBC: 260 ug/dL (ref 250–450)
UIBC: 167 ug/dL

## 2019-10-21 LAB — RENAL FUNCTION PANEL
Albumin: 3 g/dL — ABNORMAL LOW (ref 3.5–5.0)
Anion gap: 11 (ref 5–15)
BUN: 63 mg/dL — ABNORMAL HIGH (ref 8–23)
CO2: 21 mmol/L — ABNORMAL LOW (ref 22–32)
Calcium: 9.1 mg/dL (ref 8.9–10.3)
Chloride: 105 mmol/L (ref 98–111)
Creatinine, Ser: 3.67 mg/dL — ABNORMAL HIGH (ref 0.61–1.24)
GFR calc Af Amer: 17 mL/min — ABNORMAL LOW (ref >60)
GFR calc non Af Amer: 15 mL/min — ABNORMAL LOW (ref >60)
Glucose, Bld: 133 mg/dL — ABNORMAL HIGH (ref 70–99)
Phosphorus: 5.1 mg/dL — ABNORMAL HIGH (ref 2.5–4.6)
Potassium: 4.3 mmol/L (ref 3.5–5.1)
Sodium: 137 mmol/L (ref 135–145)

## 2019-10-21 LAB — POCT HEMOGLOBIN-HEMACUE: Hemoglobin: 11.2 g/dL — ABNORMAL LOW (ref 13.0–17.0)

## 2019-10-21 LAB — FERRITIN: Ferritin: 436 ng/mL — ABNORMAL HIGH (ref 24–336)

## 2019-10-21 MED ORDER — EPOETIN ALFA-EPBX 10000 UNIT/ML IJ SOLN
INTRAMUSCULAR | Status: AC
  Filled 2019-10-21: qty 3

## 2019-10-21 MED ORDER — EPOETIN ALFA-EPBX 40000 UNIT/ML IJ SOLN
30000.0000 [IU] | INTRAMUSCULAR | Status: DC
  Administered 2019-10-21: 30000 [IU] via SUBCUTANEOUS

## 2019-10-23 LAB — PTH, INTACT AND CALCIUM
Calcium, Total (PTH): 9.4 mg/dL (ref 8.6–10.2)
PTH: 77 pg/mL — ABNORMAL HIGH (ref 15–65)

## 2019-11-07 DIAGNOSIS — D631 Anemia in chronic kidney disease: Secondary | ICD-10-CM | POA: Diagnosis not present

## 2019-11-07 DIAGNOSIS — I770 Arteriovenous fistula, acquired: Secondary | ICD-10-CM | POA: Diagnosis not present

## 2019-11-07 DIAGNOSIS — M109 Gout, unspecified: Secondary | ICD-10-CM | POA: Diagnosis not present

## 2019-11-07 DIAGNOSIS — Q600 Renal agenesis, unilateral: Secondary | ICD-10-CM | POA: Diagnosis not present

## 2019-11-07 DIAGNOSIS — N2581 Secondary hyperparathyroidism of renal origin: Secondary | ICD-10-CM | POA: Diagnosis not present

## 2019-11-07 DIAGNOSIS — N184 Chronic kidney disease, stage 4 (severe): Secondary | ICD-10-CM | POA: Diagnosis not present

## 2019-11-07 DIAGNOSIS — I129 Hypertensive chronic kidney disease with stage 1 through stage 4 chronic kidney disease, or unspecified chronic kidney disease: Secondary | ICD-10-CM | POA: Diagnosis not present

## 2019-11-11 ENCOUNTER — Ambulatory Visit (HOSPITAL_COMMUNITY)
Admission: RE | Admit: 2019-11-11 | Discharge: 2019-11-11 | Disposition: A | Discharge status: Home / Self Care | Payer: Medicare HMO | Source: Ambulatory Visit | Attending: Nephrology | Admitting: Nephrology

## 2019-11-11 ENCOUNTER — Other Ambulatory Visit: Payer: Self-pay

## 2019-11-11 VITALS — BP 144/63 | HR 66 | Temp 97.2°F | Resp 20

## 2019-11-11 DIAGNOSIS — Z862 Personal history of diseases of the blood and blood-forming organs and certain disorders involving the immune mechanism: Secondary | ICD-10-CM | POA: Diagnosis not present

## 2019-11-11 DIAGNOSIS — N189 Chronic kidney disease, unspecified: Secondary | ICD-10-CM | POA: Insufficient documentation

## 2019-11-11 LAB — POCT HEMOGLOBIN-HEMACUE: Hemoglobin: 10.6 g/dL — ABNORMAL LOW (ref 13.0–17.0)

## 2019-11-11 MED ORDER — EPOETIN ALFA-EPBX 10000 UNIT/ML IJ SOLN
INTRAMUSCULAR | Status: AC
  Administered 2019-11-11: 30000 [IU] via SUBCUTANEOUS
  Filled 2019-11-11: qty 3

## 2019-11-11 MED ORDER — EPOETIN ALFA-EPBX 40000 UNIT/ML IJ SOLN: 30000.0000 [IU] | INTRAMUSCULAR | Status: DC

## 2019-11-26 DIAGNOSIS — E119 Type 2 diabetes mellitus without complications: Secondary | ICD-10-CM | POA: Diagnosis not present

## 2019-12-02 ENCOUNTER — Encounter (HOSPITAL_COMMUNITY): Payer: Medicare HMO

## 2019-12-02 ENCOUNTER — Other Ambulatory Visit: Payer: Self-pay

## 2019-12-02 ENCOUNTER — Ambulatory Visit (HOSPITAL_COMMUNITY)
Admission: RE | Admit: 2019-12-02 | Discharge: 2019-12-02 | Disposition: A | Discharge status: Home / Self Care | Payer: Medicare HMO | Source: Ambulatory Visit | Attending: Nephrology | Admitting: Nephrology

## 2019-12-02 VITALS — BP 140/58 | HR 76 | Temp 96.2°F | Resp 20

## 2019-12-02 DIAGNOSIS — J309 Allergic rhinitis, unspecified: Secondary | ICD-10-CM | POA: Diagnosis not present

## 2019-12-02 DIAGNOSIS — E1129 Type 2 diabetes mellitus with other diabetic kidney complication: Secondary | ICD-10-CM | POA: Diagnosis not present

## 2019-12-02 DIAGNOSIS — N184 Chronic kidney disease, stage 4 (severe): Secondary | ICD-10-CM | POA: Diagnosis not present

## 2019-12-02 DIAGNOSIS — Z862 Personal history of diseases of the blood and blood-forming organs and certain disorders involving the immune mechanism: Secondary | ICD-10-CM | POA: Diagnosis not present

## 2019-12-02 DIAGNOSIS — R001 Bradycardia, unspecified: Secondary | ICD-10-CM | POA: Diagnosis not present

## 2019-12-02 DIAGNOSIS — E611 Iron deficiency: Secondary | ICD-10-CM | POA: Diagnosis not present

## 2019-12-02 DIAGNOSIS — I519 Heart disease, unspecified: Secondary | ICD-10-CM | POA: Diagnosis not present

## 2019-12-02 DIAGNOSIS — N189 Chronic kidney disease, unspecified: Secondary | ICD-10-CM | POA: Diagnosis not present

## 2019-12-02 DIAGNOSIS — R5383 Other fatigue: Secondary | ICD-10-CM | POA: Diagnosis not present

## 2019-12-02 DIAGNOSIS — I4510 Unspecified right bundle-branch block: Secondary | ICD-10-CM | POA: Diagnosis not present

## 2019-12-02 DIAGNOSIS — I130 Hypertensive heart and chronic kidney disease with heart failure and stage 1 through stage 4 chronic kidney disease, or unspecified chronic kidney disease: Secondary | ICD-10-CM | POA: Diagnosis not present

## 2019-12-02 LAB — IRON AND TIBC
Iron: 79 ug/dL (ref 45–182)
Saturation Ratios: 31 % (ref 17.9–39.5)
TIBC: 255 ug/dL (ref 250–450)
UIBC: 176 ug/dL

## 2019-12-02 LAB — FERRITIN: Ferritin: 404 ng/mL — ABNORMAL HIGH (ref 24–336)

## 2019-12-02 LAB — POCT HEMOGLOBIN-HEMACUE: Hemoglobin: 10.8 g/dL — ABNORMAL LOW (ref 13.0–17.0)

## 2019-12-02 MED ORDER — EPOETIN ALFA-EPBX 10000 UNIT/ML IJ SOLN: 30000.0000 [IU] | INTRAMUSCULAR | Status: DC

## 2019-12-02 MED ORDER — EPOETIN ALFA-EPBX 10000 UNIT/ML IJ SOLN
INTRAMUSCULAR | Status: AC
  Administered 2019-12-02: 09:00:00 30000 [IU] via SUBCUTANEOUS
  Filled 2019-12-02: qty 3

## 2019-12-05 ENCOUNTER — Ambulatory Visit: Payer: Medicare HMO | Admitting: Cardiovascular Disease

## 2019-12-05 ENCOUNTER — Encounter: Payer: Self-pay | Admitting: Cardiovascular Disease

## 2019-12-05 ENCOUNTER — Other Ambulatory Visit: Payer: Self-pay | Admitting: Cardiovascular Disease

## 2019-12-05 ENCOUNTER — Other Ambulatory Visit: Payer: Self-pay

## 2019-12-05 VITALS — BP 136/62 | HR 70 | Ht 72.0 in | Wt 273.0 lb

## 2019-12-05 DIAGNOSIS — R001 Bradycardia, unspecified: Secondary | ICD-10-CM | POA: Diagnosis not present

## 2019-12-05 DIAGNOSIS — R0989 Other specified symptoms and signs involving the circulatory and respiratory systems: Secondary | ICD-10-CM

## 2019-12-05 DIAGNOSIS — M79605 Pain in left leg: Secondary | ICD-10-CM | POA: Diagnosis not present

## 2019-12-05 DIAGNOSIS — I10 Essential (primary) hypertension: Secondary | ICD-10-CM

## 2019-12-05 DIAGNOSIS — M79604 Pain in right leg: Secondary | ICD-10-CM

## 2019-12-05 DIAGNOSIS — I739 Peripheral vascular disease, unspecified: Secondary | ICD-10-CM

## 2019-12-05 NOTE — Patient Instructions (Signed)
Medication Instructions:  The current medical regimen is effective;  continue present plan and medications.  *If you need a refill on your cardiac medications before your next appointment, please call your pharmacy*  Testing/Procedures: Your physician has requested that you have an ankle brachial index (ABI). During this test an ultrasound and blood pressure cuff are used to evaluate the arteries that supply the arms and legs with blood. Allow thirty minutes for this exam. There are no restrictions or special instructions.  Your physician has requested that you have a lower extremity arterial duplex. This test is an ultrasound of the arteries in the legs. It looks at arterial blood flow in the legs and arms. Allow one hour for Lower Arterial scans. There are no restrictions or special instructions   Follow-Up: At [facility], you and your health needs are our priority.  As part of our continuing mission to provide you with exceptional heart care, we have created designated Provider Care Teams.  These Care Teams include your primary Cardiologist (physician) and Advanced Practice Providers (APPs -  Physician Assistants and Nurse Practitioners) who all work together to provide you with the care you need, when you need it.  We recommend signing up for the patient portal called "MyChart".  Sign up information is provided on this After Visit Summary.  MyChart is used to connect with patients for Virtual Visits (Telemedicine).  Patients are able to view lab/test results, encounter notes, upcoming appointments, etc.  Non-urgent messages can be sent to your provider as well.   To learn more about what you can do with MyChart, go to [email or web address].    Your next appointment:   F/U as planned  The format for your next appointment:   In Person  Provider:   Quay Burow, MD

## 2019-12-05 NOTE — Progress Notes (Signed)
Cardiology Office Note:   Date:  12/05/2019  NAME:  David Nguyen    MRN: [id number] DOB:  26-Dec-1940   PCP:  [name], MD  Cardiologist:  No primary care provider on file.   Referring MD: [name], MD   Chief Complaint  Patient presents with  . Bradycardia    History of Present Illness:   David Nguyen is a 79 y.o. male with a hx of hypertension, CKD, diabetes who is being seen today for the evaluation of bradycardia.  He was admitted to the hospital in June 2020 with concern for symptomatic bradycardia.  He was found to have heart rate in the 60s with a junctional rhythm.  His beta-blocker was held and he was seen by cardiology on outpatient basis.  They did reinstitute his beta-blocker.  He presents for follow-up today.  He reports he was evaluated by his primary care physician 2 days ago.  He states he did not fill the day prior to being evaluated by his primary care physician.  He reports symptoms of fatigue and seasonal allergies.  He denies any shortness of breath or chest pain.  He reports that he does not exercise routinely but is able to get around the house without any limitations such as dizziness or lightheadedness.  He has no alarming symptoms such as syncope or presyncopal symptoms.  He denies any chest pain today.  He reports that he is wearing his compression stockings in his lower extremity edema is under control.  He really has no major complaints.  He states he is starting to feel little better.  He does report occasionally he gets some pain in his legs when he walks.  He is concerned he may have PAD.  He reports he would like to have this evaluated.  Blood pressures well controlled today.  Volume status is acceptable.  Review of laboratory data shows an A1c of 6.8.  His cholesterol levels are at goal.  No major changes since he was last seen by Dr. Alvester Chou a few months ago.  Past Medical History: Past Medical History:  Diagnosis Date  . Allergic rhinitis   .  Anemia    low iron  . Bradycardia 03/2019  . Chronic kidney disease    Followed by Dr Deterding    . Diabetes mellitus    Type II  . Horseshoe kidney   . Hyperlipidemia   . Hypertension   . Hypothyroidism   . Sleep apnea    uses a cpap    Past Surgical History: Past Surgical History:  Procedure Laterality Date  . ADRENALECTOMY  1993   left  . AV FISTULA PLACEMENT Left 05/06/2018   Procedure: ARTERIOVENOUS (AV) FISTULA CREATION RADIOCEPHALIC;  Surgeon: [name], MD;  Location: [facility];  Service: Vascular;  Laterality: Left;  . COLONOSCOPY W/ POLYPECTOMY    . REVISION OF ARTERIOVENOUS GORETEX GRAFT Left 07/11/2018   Procedure: TRANSPOSITION OF RADIOCEPHALIC  ARTERIOVENOUS FISTULA LEFT ARM;  Surgeon: [name], MD;  [facility];  Service: Vascular;  Laterality: Left;    Current Medications: Current Meds  Medication Sig  . allopurinol (ZYLOPRIM) 100 MG tablet Take 100 mg by mouth 2 (two) times daily.   Marland Kitchen amLODipine (NORVASC) 10 MG tablet Take 10 mg by mouth at bedtime.   Marland Kitchen aspirin EC 81 MG tablet Take 81 mg by mouth daily.  Marland Kitchen atorvastatin (LIPITOR) 40 MG tablet Take 40 mg by mouth at bedtime.   . calcitRIOL (ROCALTROL)  0.25 MCG capsule Take 0.25 mcg by mouth daily.   . carvedilol (COREG) 3.125 MG tablet Take 1 tablet (3.125 mg total) by mouth 2 (two) times daily with a meal.  . doxazosin (CARDURA) 8 MG tablet Take 8 mg by mouth at bedtime.    . fluticasone (FLONASE) 50 MCG/ACT nasal spray Place 2 sprays into both nostrils daily.   . furosemide (LASIX) 80 MG tablet Take 1 tablet (80 mg total) by mouth daily. (Patient taking differently: Take 80 mg by mouth daily. TAKE 1 TABLET MORNING AND 1/2 TABLET IN THE AFTERNOON.)  . Homeopathic Products [facility] ALLERGY EYE RELIEF) SOLN Place 1-2 drops into both eyes daily as needed (for dry eyes).  . Insulin Isophane & Regular Human (HUMULIN 70/30 MIX) (70-30) 100 UNIT/ML PEN Inject 10 units subcutaneously before breakfast and  inject 10 units subcutaneously before supper  . levothyroxine (SYNTHROID, LEVOTHROID) 75 MCG tablet Take 75 mcg by mouth daily before breakfast.  . lisinopril (ZESTRIL) 40 MG tablet Take 40 mg by mouth daily.  Marland Kitchen loratadine (CLARITIN) 10 MG tablet Take 10 mg by mouth daily.  . Misc Natural Products (GLUCOSAMINE CHOND CMP TRIPLE PO) Take 1 tablet by mouth daily.  . montelukast (SINGULAIR) 10 MG tablet Take 10 mg by mouth daily.   . Multiple Vitamin (MULTIVITAMIN WITH MINERALS) TABS tablet Take 1 tablet by mouth daily.  Marland Kitchen PRESCRIPTION MEDICATION Inhale into the lungs at bedtime. CPAP  . testosterone cypionate (DEPOTESTOSTERONE CYPIONATE) 200 MG/ML injection Inject 200 mg into the muscle every 21 ( twenty-one) days.     Allergies:    Penicillins   Social History: Social History   Socioeconomic History  . Marital status: Divorced    Spouse name: Not on file  . Number of children: 5  . Years of education: Not on file  . Highest education level: Not on file  Occupational History    Comment: [employer]  Tobacco Use  . Smoking status: Former Smoker    Packs/day: 0.30    Years: 3.00    Pack years: 0.90    Types: Cigarettes    Quit date: 10/09/1968    Years since quitting: 51.1  . Smokeless tobacco: Never Used  Substance and Sexual Activity  . Alcohol use: No  . Drug use: No  . Sexual activity: Not on file  Other Topics Concern  . Not on file  Social History Narrative  . Not on file   Social Determinants of Health   Financial Resource Strain:   . Difficulty of Paying Living Expenses: Not on file  Food Insecurity:   . Worried About Charity fundraiser in the Last Year: Not on file  . Ran Out of Food in the Last Year: Not on file  Transportation Needs:   . Lack of Transportation (Medical): Not on file  . Lack of Transportation (Non-Medical): Not on file  Physical Activity:   . Days of Exercise per Week: Not on file  . Minutes of Exercise per Session: Not on file  Stress:   .  Feeling of Stress : Not on file  Social Connections:   . Frequency of Communication with Friends and Family: Not on file  . Frequency of Social Gatherings with Friends and Family: Not on file  . Attends Religious Services: Not on file  . Active Member of Clubs or Organizations: Not on file  . Attends Archivist Meetings: Not on file  . Marital Status: Not on file     Family  History: The patient's family history includes Kidney failure in his mother; Lung cancer in his sister; Prostate cancer in his father.  ROS:   All other ROS reviewed and negative. Pertinent positives noted in the HPI.     EKGs/Labs/Other Studies Reviewed:   The following studies were personally reviewed by me today:  EKG:  EKG is  ordered today.  The ekg ordered today demonstrates normal sinus rhythm, heart rate 66, right bundle branch block with left anterior fascicular block (bifascicular block), no acute ST-T changes, no evidence of prior infarction, and was personally reviewed by me.   Recent Labs: 03/31/2019: ALT 51; TSH 3.602 04/05/2019: Magnesium 2.2; Platelets 183 10/21/2019: BUN 63; Creatinine, Ser 3.67; Potassium 4.3; Sodium 137 12/02/2019: Hemoglobin 10.8   Recent Lipid Panel    Component Value Date/Time   CHOL 103 04/03/2019 0637   TRIG 45 04/03/2019 0637   HDL 41 04/03/2019 0637   CHOLHDL 2.5 04/03/2019 0637   VLDL 9 04/03/2019 0637   LDLCALC 53 04/03/2019 0637    Physical Exam:   VS:  BP 136/62 (BP Location: Left Arm, Patient Position: Sitting, Cuff Size: Large)   Pulse 70   Ht 6' (1.829 m)   Wt 273 lb (123.8 kg)   BMI 37.03 kg/m    Wt Readings from Last 3 Encounters:  12/05/19 273 lb (123.8 kg)  09/09/19 260 lb (117.9 kg)  07/17/19 260 lb (117.9 kg)    General: Well nourished, well developed, in no acute distress Heart: Atraumatic, normal size  Eyes: PEERLA, EOMI  Neck: Supple, no JVD Endocrine: No thryomegaly Cardiac: Normal S1, S2; RRR; no murmurs, rubs, or  gallops Lungs: Clear to auscultation bilaterally, no wheezing, rhonchi or rales  Abd: Soft, nontender, no hepatomegaly  Ext: No edema, faint pulses Musculoskeletal: No deformities, BUE and BLE strength normal and equal Skin: Warm and dry, no rashes   Neuro: Alert and oriented to person, place, time, and situation, CNII-XII grossly intact, no focal deficits  Psych: Normal mood and affect   ASSESSMENT:   David Nguyen is a 79 y.o. male who presents for the following: 1. Bradycardia   2. Essential hypertension   3. Pain in both lower extremities     PLAN:   1. Bradycardia -He has a baseline right bundle branch block with left anterior fascicular block.  The PR interval is normal.  Heart rate today 66.  He apparently had a junctional rhythm in June 2020 during an admission.  His beta-blocker was washed out and then rechallenged.  He seems to be doing well with this.  He has no symptoms concerning for chronotropic incompetence or worsening bradycardia.  He reports the fatigue was likely related to allergies.  Symptoms have resolved.  I really have low suspicion for worsening bradycardia or symptomatic bradycardia.  We do know that he had an echocardiogram with normal LV function.  He does have some pulmonary hypertension but overall today his volume status is acceptable.  I would not pursue a monitor or any other further testing at this time.  We will continue his current medications and he will follow with Dr. Alvester Chou in 1 year.  2. Essential hypertension -Well-controlled today.  No change to medications.  3. Pain in both lower extremities -He does report intermittent cramping in the lower extremities with exertion.  It does resolve with cessation of activity.  We will go ahead and order lower extremity ABIs as well as ultrasounds to exclude significant PAD.  He does have  poor pulses on examination.  Disposition: Return if symptoms worsen or fail to improve.  Medication Adjustments/Labs and  Tests Ordered: Current medicines are reviewed at length with the patient today.  Concerns regarding medicines are outlined above.  Orders Placed This Encounter  Procedures  . EKG 12-Lead  . VAS [place] ABI WITH/WO TBI  . VAS [place] LOWER EXTREMITY ARTERIAL DUPLEX   No orders of the defined types were placed in this encounter.   Patient Instructions  Medication Instructions:  The current medical regimen is effective;  continue present plan and medications.  *If you need a refill on your cardiac medications before your next appointment, please call your pharmacy*  Testing/Procedures: Your physician has requested that you have an ankle brachial index (ABI). During this test an ultrasound and blood pressure cuff are used to evaluate the arteries that supply the arms and legs with blood. Allow thirty minutes for this exam. There are no restrictions or special instructions.  Your physician has requested that you have a lower extremity arterial duplex. This test is an ultrasound of the arteries in the legs. It looks at arterial blood flow in the legs and arms. Allow one hour for Lower Arterial scans. There are no restrictions or special instructions   Follow-Up: At [facility], you and your health needs are our priority.  As part of our continuing mission to provide you with exceptional heart care, we have created designated Provider Care Teams.  These Care Teams include your primary Cardiologist (physician) and Advanced Practice Providers (APPs -  Physician Assistants and Nurse Practitioners) who all work together to provide you with the care you need, when you need it.  We recommend signing up for the patient portal called "MyChart".  Sign up information is provided on this After Visit Summary.  MyChart is used to connect with patients for Virtual Visits (Telemedicine).  Patients are able to view lab/test results, encounter notes, upcoming appointments, etc.  Non-urgent messages can be sent to your  provider as well.   To learn more about what you can do with MyChart, go to [email or web address].    Your next appointment:   F/U as planned  The format for your next appointment:   In Person  Provider:   Quay Burow, MD        Signed, Addison Naegeli. Audie Box, [place]  [address] [place], [place] [address] [phone]  12/05/2019 11:55 AM

## 2019-12-09 ENCOUNTER — Other Ambulatory Visit: Payer: Self-pay

## 2019-12-09 ENCOUNTER — Ambulatory Visit (HOSPITAL_COMMUNITY)
Admission: RE | Admit: 2019-12-09 | Discharge: 2019-12-09 | Disposition: A | Discharge status: Home / Self Care | Payer: Medicare HMO | Source: Ambulatory Visit | Attending: Cardiology | Admitting: Cardiology

## 2019-12-09 DIAGNOSIS — R0989 Other specified symptoms and signs involving the circulatory and respiratory systems: Secondary | ICD-10-CM | POA: Diagnosis not present

## 2019-12-09 DIAGNOSIS — I739 Peripheral vascular disease, unspecified: Secondary | ICD-10-CM | POA: Insufficient documentation

## 2019-12-09 DIAGNOSIS — G4733 Obstructive sleep apnea (adult) (pediatric): Secondary | ICD-10-CM | POA: Diagnosis not present

## 2019-12-23 ENCOUNTER — Other Ambulatory Visit: Payer: Self-pay

## 2019-12-23 ENCOUNTER — Ambulatory Visit (HOSPITAL_COMMUNITY)
Admission: RE | Admit: 2019-12-23 | Discharge: 2019-12-23 | Disposition: A | Discharge status: Home / Self Care | Payer: Medicare HMO | Source: Ambulatory Visit | Attending: Nephrology | Admitting: Nephrology

## 2019-12-23 VITALS — BP 147/63 | HR 71 | Temp 95.7°F | Resp 20

## 2019-12-23 DIAGNOSIS — N189 Chronic kidney disease, unspecified: Secondary | ICD-10-CM | POA: Insufficient documentation

## 2019-12-23 DIAGNOSIS — Z862 Personal history of diseases of the blood and blood-forming organs and certain disorders involving the immune mechanism: Secondary | ICD-10-CM | POA: Insufficient documentation

## 2019-12-23 LAB — POCT HEMOGLOBIN-HEMACUE: Hemoglobin: 11.1 g/dL — ABNORMAL LOW (ref 13.0–17.0)

## 2019-12-23 MED ORDER — EPOETIN ALFA-EPBX 10000 UNIT/ML IJ SOLN
INTRAMUSCULAR | Status: AC
  Administered 2019-12-23: 30000 [IU] via SUBCUTANEOUS
  Filled 2019-12-23: qty 3

## 2019-12-23 MED ORDER — EPOETIN ALFA-EPBX 40000 UNIT/ML IJ SOLN: 30000.0000 [IU] | INTRAMUSCULAR | Status: DC

## 2019-12-29 DIAGNOSIS — N184 Chronic kidney disease, stage 4 (severe): Secondary | ICD-10-CM | POA: Diagnosis not present

## 2019-12-29 DIAGNOSIS — M79605 Pain in left leg: Secondary | ICD-10-CM | POA: Diagnosis not present

## 2019-12-29 DIAGNOSIS — E1129 Type 2 diabetes mellitus with other diabetic kidney complication: Secondary | ICD-10-CM | POA: Diagnosis not present

## 2019-12-29 DIAGNOSIS — G4733 Obstructive sleep apnea (adult) (pediatric): Secondary | ICD-10-CM | POA: Diagnosis not present

## 2019-12-29 DIAGNOSIS — R001 Bradycardia, unspecified: Secondary | ICD-10-CM | POA: Diagnosis not present

## 2019-12-29 DIAGNOSIS — I130 Hypertensive heart and chronic kidney disease with heart failure and stage 1 through stage 4 chronic kidney disease, or unspecified chronic kidney disease: Secondary | ICD-10-CM | POA: Diagnosis not present

## 2019-12-29 DIAGNOSIS — D631 Anemia in chronic kidney disease: Secondary | ICD-10-CM | POA: Diagnosis not present

## 2019-12-29 DIAGNOSIS — I5032 Chronic diastolic (congestive) heart failure: Secondary | ICD-10-CM | POA: Diagnosis not present

## 2019-12-30 ENCOUNTER — Other Ambulatory Visit: Payer: Self-pay

## 2019-12-30 ENCOUNTER — Ambulatory Visit: Payer: Medicare HMO | Admitting: Cardiovascular Disease

## 2019-12-30 ENCOUNTER — Encounter: Payer: Self-pay | Admitting: Cardiovascular Disease

## 2019-12-30 DIAGNOSIS — I272 Pulmonary hypertension, unspecified: Secondary | ICD-10-CM | POA: Diagnosis not present

## 2019-12-30 DIAGNOSIS — I10 Essential (primary) hypertension: Secondary | ICD-10-CM | POA: Diagnosis not present

## 2019-12-30 DIAGNOSIS — R001 Bradycardia, unspecified: Secondary | ICD-10-CM

## 2019-12-30 NOTE — Patient Instructions (Signed)

## 2019-12-30 NOTE — Progress Notes (Signed)
12/30/2019 David Nguyen   [birth year]  [id number]  Primary Physician David Solian, MD Primary Cardiologist: David Harp MD David Nguyen, [place]  HPI:  David Nguyen is a 79 y.o.  severely overweight married African-American male father of 51 children, grandfather of 43 grandchildren who still does long-term care in the mornings.  His primary care physician is Dr. Dagmar Nguyen.  I last saw him in the office 04/25/2019.  He did see Dr. Davina Nguyen in the office 12/05/2019. He was admitted to [facility] 03/31/2019 with bradycardia and discharged 5 days later.  During his hospitalization 2D echo revealed normal LV systolic function, diastolic dysfunction and moderate to severe pulmonary hypertension.  He does have stage III CKD with serum creatinine in the low 3 range though not yet on dialysis.  His other problems include treated hypertension, diabetes and hyperlipidemia.  His beta-blockers were adjusted, initially held and then reinstituted at a lower dose.    Since I saw him in the office 9 months ago he continues to do well.  He has had no further episodes of symptomatic bradycardia.  His serum creatinine is in the mid 3 range and he has yet to be referred for hemodialysis.  He denies chest pain or shortness of breath.  He did have lower extremity arterial Doppler studies that did not show significant obstructive disease.  Current Meds  Medication Sig  . ACCU-CHEK GUIDE test strip   . allopurinol (ZYLOPRIM) 100 MG tablet Take 100 mg by mouth 2 (two) times daily.   Marland Kitchen amLODipine (NORVASC) 10 MG tablet Take 10 mg by mouth at bedtime.   Marland Kitchen aspirin EC 81 MG tablet Take 81 mg by mouth daily.  Marland Kitchen atorvastatin (LIPITOR) 40 MG tablet Take 40 mg by mouth at bedtime.   . calcitRIOL (ROCALTROL) 0.25 MCG capsule Take 0.25 mcg by mouth daily.   . carvedilol (COREG) 3.125 MG tablet Take 1 tablet (3.125 mg total) by mouth 2 (two) times daily with a meal.  . doxazosin (CARDURA) 8 MG tablet Take 8 mg by  mouth at bedtime.    . DROPLET PEN NEEDLES 31G X 8 MM MISC   . fluticasone (FLONASE) 50 MCG/ACT nasal spray Place 2 sprays into both nostrils daily.   . furosemide (LASIX) 80 MG tablet Take 1 tablet (80 mg total) by mouth daily. (Patient taking differently: Take 80 mg by mouth daily. TAKE 1 TABLET MORNING AND 1/2 TABLET IN THE AFTERNOON.)  . Homeopathic Products [facility] ALLERGY EYE RELIEF) SOLN Place 1-2 drops into both eyes daily as needed (for dry eyes).  . Insulin Isophane & Regular Human (HUMULIN 70/30 MIX) (70-30) 100 UNIT/ML PEN Inject 10 units subcutaneously before breakfast and inject 10 units subcutaneously before supper  . levothyroxine (SYNTHROID, LEVOTHROID) 75 MCG tablet Take 75 mcg by mouth daily before breakfast.  . lisinopril (ZESTRIL) 40 MG tablet Take 40 mg by mouth daily.  Marland Kitchen loratadine (CLARITIN) 10 MG tablet Take 10 mg by mouth daily.  . Misc Natural Products (GLUCOSAMINE CHOND CMP TRIPLE PO) Take 1 tablet by mouth daily.  . montelukast (SINGULAIR) 10 MG tablet Take 10 mg by mouth daily.   . Multiple Vitamin (MULTIVITAMIN WITH MINERALS) TABS tablet Take 1 tablet by mouth daily.  Marland Kitchen PRESCRIPTION MEDICATION Inhale into the lungs at bedtime. CPAP  . testosterone cypionate (DEPOTESTOSTERONE CYPIONATE) 200 MG/ML injection Inject 200 mg into the muscle every 21 ( twenty-one) days.     Allergies  Allergen Reactions  .  Penicillins Other (See Comments)    UNSPECIFIED REACTION  Has patient had a PCN reaction causing immediate rash, facial/tongue/throat swelling, SOB or lightheadedness with hypotension: Unknown Has patient had a PCN reaction causing severe rash involving mucus membranes or skin necrosis: Unknown Has patient had a PCN reaction that required hospitalization: Unknown Has patient had a PCN reaction occurring within the last 10 years: No If all of the above answers are "NO", then may proceed with Cephalosporin use.     Social History   Socioeconomic History  .  Marital status: Divorced    Spouse name: Not on file  . Number of children: 5  . Years of education: Not on file  . Highest education level: Not on file  Occupational History    Comment: [employer]  Tobacco Use  . Smoking status: Former Smoker    Packs/day: 0.30    Years: 3.00    Pack years: 0.90    Types: Cigarettes    Quit date: 10/09/1968    Years since quitting: 51.2  . Smokeless tobacco: Never Used  Substance and Sexual Activity  . Alcohol use: No  . Drug use: No  . Sexual activity: Not on file  Other Topics Concern  . Not on file  Social History Narrative  . Not on file   Social Determinants of Health   Financial Resource Strain:   . Difficulty of Paying Living Expenses:   Food Insecurity:   . Worried About Charity fundraiser in the Last Year:   . Arboriculturist in the Last Year:   Transportation Needs:   . Film/video editor (Medical):   Marland Kitchen Lack of Transportation (Non-Medical):   Physical Activity:   . Days of Exercise per Week:   . Minutes of Exercise per Session:   Stress:   . Feeling of Stress :   Social Connections:   . Frequency of Communication with Friends and Family:   . Frequency of Social Gatherings with Friends and Family:   . Attends Religious Services:   . Active Member of Clubs or Organizations:   . Attends Archivist Meetings:   Marland Kitchen Marital Status:   Intimate Partner Violence:   . Fear of Current or Ex-Partner:   . Emotionally Abused:   Marland Kitchen Physically Abused:   . Sexually Abused:      Review of Systems: General: negative for chills, fever, night sweats or weight changes.  Cardiovascular: negative for chest pain, dyspnea on exertion, edema, orthopnea, palpitations, paroxysmal nocturnal dyspnea or shortness of breath Dermatological: negative for rash Respiratory: negative for cough or wheezing Urologic: negative for hematuria Abdominal: negative for nausea, vomiting, diarrhea, bright red blood per rectum, melena, or  hematemesis Neurologic: negative for visual changes, syncope, or dizziness All other systems reviewed and are otherwise negative except as noted above.    Blood pressure 120/60, pulse 63, height 6' (1.829 m), weight 283 lb 9.6 oz (128.6 kg), SpO2 97 %.  General appearance: alert and no distress Neck: no adenopathy, no carotid bruit, no JVD, supple, symmetrical, trachea midline and thyroid not enlarged, symmetric, no tenderness/mass/nodules Lungs: clear to auscultation bilaterally Heart: regular rate and rhythm, S1, S2 normal, no murmur, click, rub or gallop Extremities: extremities normal, atraumatic, no cyanosis or edema Pulses: 2+ and symmetric Skin: Skin color, texture, turgor normal. No rashes or lesions Neurologic: Alert and oriented X 3, normal strength and tone. Normal symmetric reflexes. Normal coordination and gait  EKG not performed today  ASSESSMENT AND PLAN:  HYPERLIPIDEMIA History of hyperlipidemia on statin therapy with lipid profile performed 06/19/2019 revealing total cholesterol 133, LDL 75 and HDL of 43.  Essential hypertension History of essential hypertension blood pressure measured today 120/60.  He is on amlodipine, and carvedilol, and lisinopril..  Bradycardia History of symptomatic bradycardia in the past no longer a problem with heart rates in the 60s on low-dose carvedilol.  Pulmonary hypertension ([place]) History of moderate pulmonary hypertension 2D echo performed 04/01/2019 revealing right ventricular systolic pressure of 57.      David Harp MD [facility] 12/30/2019 8:53 AM

## 2019-12-30 NOTE — Assessment & Plan Note (Addendum)
History of essential hypertension blood pressure measured today 120/60.  He is on amlodipine, and carvedilol, and lisinopril.David Nguyen

## 2019-12-30 NOTE — Assessment & Plan Note (Signed)
History of moderate pulmonary hypertension 2D echo performed 04/01/2019 revealing right ventricular systolic pressure of 57.

## 2019-12-30 NOTE — Assessment & Plan Note (Signed)
History of symptomatic bradycardia in the past no longer a problem with heart rates in the 60s on low-dose carvedilol.

## 2019-12-30 NOTE — Assessment & Plan Note (Signed)
History of hyperlipidemia on statin therapy with lipid profile performed 06/19/2019 revealing total cholesterol 133, LDL 75 and HDL of 43.

## 2020-01-13 ENCOUNTER — Other Ambulatory Visit: Payer: Self-pay

## 2020-01-13 ENCOUNTER — Encounter (HOSPITAL_COMMUNITY)
Admission: RE | Admit: 2020-01-13 | Discharge: 2020-01-13 | Disposition: A | Discharge status: Home / Self Care | Payer: Medicare HMO | Source: Ambulatory Visit | Attending: Nephrology | Admitting: Nephrology

## 2020-01-13 VITALS — BP 164/66 | HR 67 | Temp 97.3°F | Resp 20

## 2020-01-13 DIAGNOSIS — Z862 Personal history of diseases of the blood and blood-forming organs and certain disorders involving the immune mechanism: Secondary | ICD-10-CM | POA: Insufficient documentation

## 2020-01-13 DIAGNOSIS — N189 Chronic kidney disease, unspecified: Secondary | ICD-10-CM | POA: Insufficient documentation

## 2020-01-13 LAB — IRON AND TIBC
Iron: 78 ug/dL (ref 45–182)
Saturation Ratios: 32 % (ref 17.9–39.5)
TIBC: 245 ug/dL — ABNORMAL LOW (ref 250–450)
UIBC: 167 ug/dL

## 2020-01-13 LAB — POCT HEMOGLOBIN-HEMACUE: Hemoglobin: 11.7 g/dL — ABNORMAL LOW (ref 13.0–17.0)

## 2020-01-13 LAB — FERRITIN: Ferritin: 343 ng/mL — ABNORMAL HIGH (ref 24–336)

## 2020-01-13 MED ORDER — EPOETIN ALFA-EPBX 10000 UNIT/ML IJ SOLN
30000.0000 [IU] | INTRAMUSCULAR | Status: DC
  Administered 2020-01-13: 30000 [IU] via SUBCUTANEOUS

## 2020-01-13 MED ORDER — EPOETIN ALFA-EPBX 10000 UNIT/ML IJ SOLN
INTRAMUSCULAR | Status: AC
  Filled 2020-01-13: qty 3

## 2020-02-03 ENCOUNTER — Encounter (HOSPITAL_COMMUNITY)
Admission: RE | Admit: 2020-02-03 | Discharge: 2020-02-03 | Disposition: A | Discharge status: Home / Self Care | Payer: Medicare HMO | Source: Ambulatory Visit | Attending: Nephrology | Admitting: Nephrology

## 2020-02-03 ENCOUNTER — Other Ambulatory Visit: Payer: Self-pay

## 2020-02-03 VITALS — BP 177/79 | HR 70 | Temp 97.9°F | Resp 20

## 2020-02-03 DIAGNOSIS — Z862 Personal history of diseases of the blood and blood-forming organs and certain disorders involving the immune mechanism: Secondary | ICD-10-CM

## 2020-02-03 DIAGNOSIS — N189 Chronic kidney disease, unspecified: Secondary | ICD-10-CM | POA: Diagnosis not present

## 2020-02-03 LAB — POCT HEMOGLOBIN-HEMACUE: Hemoglobin: 11.3 g/dL — ABNORMAL LOW (ref 13.0–17.0)

## 2020-02-03 MED ORDER — EPOETIN ALFA-EPBX 10000 UNIT/ML IJ SOLN
INTRAMUSCULAR | Status: AC
  Administered 2020-02-03: 30000 [IU] via SUBCUTANEOUS
  Filled 2020-02-03: qty 3

## 2020-02-03 MED ORDER — EPOETIN ALFA-EPBX 10000 UNIT/ML IJ SOLN: 30000.0000 [IU] | INTRAMUSCULAR | Status: DC

## 2020-02-05 DIAGNOSIS — D631 Anemia in chronic kidney disease: Secondary | ICD-10-CM | POA: Diagnosis not present

## 2020-02-05 DIAGNOSIS — N184 Chronic kidney disease, stage 4 (severe): Secondary | ICD-10-CM | POA: Diagnosis not present

## 2020-02-05 DIAGNOSIS — Q600 Renal agenesis, unilateral: Secondary | ICD-10-CM | POA: Diagnosis not present

## 2020-02-05 DIAGNOSIS — N2581 Secondary hyperparathyroidism of renal origin: Secondary | ICD-10-CM | POA: Diagnosis not present

## 2020-02-05 DIAGNOSIS — I129 Hypertensive chronic kidney disease with stage 1 through stage 4 chronic kidney disease, or unspecified chronic kidney disease: Secondary | ICD-10-CM | POA: Diagnosis not present

## 2020-02-05 DIAGNOSIS — I770 Arteriovenous fistula, acquired: Secondary | ICD-10-CM | POA: Diagnosis not present

## 2020-02-05 DIAGNOSIS — M109 Gout, unspecified: Secondary | ICD-10-CM | POA: Diagnosis not present

## 2020-02-24 ENCOUNTER — Ambulatory Visit (HOSPITAL_COMMUNITY)
Admission: RE | Admit: 2020-02-24 | Discharge: 2020-02-24 | Disposition: A | Discharge status: Home / Self Care | Payer: Medicare HMO | Source: Ambulatory Visit | Attending: Nephrology | Admitting: Nephrology

## 2020-02-24 ENCOUNTER — Other Ambulatory Visit: Payer: Self-pay

## 2020-02-24 VITALS — BP 171/68 | HR 64 | Temp 97.1°F | Resp 20

## 2020-02-24 DIAGNOSIS — Z862 Personal history of diseases of the blood and blood-forming organs and certain disorders involving the immune mechanism: Secondary | ICD-10-CM | POA: Insufficient documentation

## 2020-02-24 DIAGNOSIS — N189 Chronic kidney disease, unspecified: Secondary | ICD-10-CM | POA: Diagnosis not present

## 2020-02-24 LAB — IRON AND TIBC
Iron: 75 ug/dL (ref 45–182)
Saturation Ratios: 29 % (ref 17.9–39.5)
TIBC: 262 ug/dL (ref 250–450)
UIBC: 187 ug/dL

## 2020-02-24 LAB — POCT HEMOGLOBIN-HEMACUE: Hemoglobin: 11.6 g/dL — ABNORMAL LOW (ref 13.0–17.0)

## 2020-02-24 LAB — FERRITIN: Ferritin: 276 ng/mL (ref 24–336)

## 2020-02-24 MED ORDER — EPOETIN ALFA-EPBX 40000 UNIT/ML IJ SOLN: 30000.0000 [IU] | INTRAMUSCULAR | Status: DC

## 2020-02-24 MED ORDER — EPOETIN ALFA-EPBX 10000 UNIT/ML IJ SOLN
INTRAMUSCULAR | Status: AC
  Administered 2020-02-24: 30000 [IU] via SUBCUTANEOUS
  Filled 2020-02-24: qty 3

## 2020-02-27 ENCOUNTER — Telehealth: Payer: Self-pay

## 2020-02-27 NOTE — Telephone Encounter (Signed)
rec'vd referral from Dr. Carolin Sicks - poor thrill on exam - ok to schedule fistulagram per Dr. Donzetta Matters

## 2020-03-01 ENCOUNTER — Other Ambulatory Visit: Payer: Self-pay

## 2020-03-02 DIAGNOSIS — S39012A Strain of muscle, fascia and tendon of lower back, initial encounter: Secondary | ICD-10-CM | POA: Diagnosis not present

## 2020-03-02 DIAGNOSIS — N184 Chronic kidney disease, stage 4 (severe): Secondary | ICD-10-CM | POA: Diagnosis not present

## 2020-03-02 DIAGNOSIS — N509 Disorder of male genital organs, unspecified: Secondary | ICD-10-CM | POA: Diagnosis not present

## 2020-03-02 DIAGNOSIS — E1129 Type 2 diabetes mellitus with other diabetic kidney complication: Secondary | ICD-10-CM | POA: Diagnosis not present

## 2020-03-02 DIAGNOSIS — I272 Pulmonary hypertension, unspecified: Secondary | ICD-10-CM | POA: Diagnosis not present

## 2020-03-02 DIAGNOSIS — E291 Testicular hypofunction: Secondary | ICD-10-CM | POA: Diagnosis not present

## 2020-03-02 DIAGNOSIS — Z1331 Encounter for screening for depression: Secondary | ICD-10-CM | POA: Diagnosis not present

## 2020-03-03 ENCOUNTER — Other Ambulatory Visit (HOSPITAL_COMMUNITY)
Admission: RE | Admit: 2020-03-03 | Discharge: 2020-03-03 | Disposition: A | Discharge status: Home / Self Care | Payer: Medicare HMO | Source: Ambulatory Visit | Attending: Vascular Surgery | Admitting: Vascular Surgery

## 2020-03-03 DIAGNOSIS — Z20822 Contact with and (suspected) exposure to covid-19: Secondary | ICD-10-CM | POA: Insufficient documentation

## 2020-03-03 DIAGNOSIS — Z01812 Encounter for preprocedural laboratory examination: Secondary | ICD-10-CM | POA: Insufficient documentation

## 2020-03-04 LAB — SARS CORONAVIRUS 2 (TAT 6-24 HRS): SARS Coronavirus 2: NEGATIVE

## 2020-03-05 ENCOUNTER — Other Ambulatory Visit: Payer: Self-pay

## 2020-03-05 ENCOUNTER — Encounter (HOSPITAL_COMMUNITY)
Admission: RE | Disposition: A | Discharge status: Home / Self Care | Payer: Self-pay | Source: Home / Self Care | Attending: Vascular Surgery

## 2020-03-05 ENCOUNTER — Ambulatory Visit (HOSPITAL_COMMUNITY)
Admission: RE | Admit: 2020-03-05 | Discharge: 2020-03-05 | Disposition: A | Discharge status: Home / Self Care | Payer: Medicare HMO | Attending: Vascular Surgery | Admitting: Vascular Surgery

## 2020-03-05 DIAGNOSIS — Y832 Surgical operation with anastomosis, bypass or graft as the cause of abnormal reaction of the patient, or of later complication, without mention of misadventure at the time of the procedure: Secondary | ICD-10-CM | POA: Insufficient documentation

## 2020-03-05 DIAGNOSIS — G4730 Sleep apnea, unspecified: Secondary | ICD-10-CM | POA: Diagnosis not present

## 2020-03-05 DIAGNOSIS — T82510A Breakdown (mechanical) of surgically created arteriovenous fistula, initial encounter: Secondary | ICD-10-CM | POA: Diagnosis not present

## 2020-03-05 DIAGNOSIS — Z880 Allergy status to penicillin: Secondary | ICD-10-CM | POA: Diagnosis not present

## 2020-03-05 DIAGNOSIS — T82898A Other specified complication of vascular prosthetic devices, implants and grafts, initial encounter: Secondary | ICD-10-CM | POA: Diagnosis not present

## 2020-03-05 DIAGNOSIS — E785 Hyperlipidemia, unspecified: Secondary | ICD-10-CM | POA: Insufficient documentation

## 2020-03-05 DIAGNOSIS — E039 Hypothyroidism, unspecified: Secondary | ICD-10-CM | POA: Insufficient documentation

## 2020-03-05 DIAGNOSIS — E1122 Type 2 diabetes mellitus with diabetic chronic kidney disease: Secondary | ICD-10-CM | POA: Diagnosis not present

## 2020-03-05 DIAGNOSIS — I120 Hypertensive chronic kidney disease with stage 5 chronic kidney disease or end stage renal disease: Secondary | ICD-10-CM | POA: Diagnosis not present

## 2020-03-05 DIAGNOSIS — D509 Iron deficiency anemia, unspecified: Secondary | ICD-10-CM | POA: Diagnosis not present

## 2020-03-05 DIAGNOSIS — N185 Chronic kidney disease, stage 5: Secondary | ICD-10-CM | POA: Diagnosis not present

## 2020-03-05 DIAGNOSIS — N186 End stage renal disease: Secondary | ICD-10-CM | POA: Diagnosis present

## 2020-03-05 DIAGNOSIS — Z87891 Personal history of nicotine dependence: Secondary | ICD-10-CM | POA: Diagnosis not present

## 2020-03-05 HISTORY — PX: A/V FISTULAGRAM: CATH118298

## 2020-03-05 LAB — POCT I-STAT, CHEM 8
BUN: 77 mg/dL — ABNORMAL HIGH (ref 8–23)
Calcium, Ion: 1.34 mmol/L (ref 1.15–1.40)
Chloride: 103 mmol/L (ref 98–111)
Creatinine, Ser: 4.9 mg/dL — ABNORMAL HIGH (ref 0.61–1.24)
Glucose, Bld: 74 mg/dL (ref 70–99)
HCT: 38 % — ABNORMAL LOW (ref 39.0–52.0)
Hemoglobin: 12.9 g/dL — ABNORMAL LOW (ref 13.0–17.0)
Potassium: 4 mmol/L (ref 3.5–5.1)
Sodium: 141 mmol/L (ref 135–145)
TCO2: 23 mmol/L (ref 22–32)

## 2020-03-05 LAB — GLUCOSE, CAPILLARY: Glucose-Capillary: 72 mg/dL (ref 70–99)

## 2020-03-05 SURGERY — A/V FISTULAGRAM
Anesthesia: LOCAL | Laterality: Left

## 2020-03-05 MED ORDER — SODIUM CHLORIDE 0.9 % IV SOLN: 250.0000 mL | INTRAVENOUS | Status: DC | PRN

## 2020-03-05 MED ORDER — LIDOCAINE HCL (PF) 1 % IJ SOLN
INTRAMUSCULAR | Status: DC | PRN
  Administered 2020-03-05: 2 mL via INTRADERMAL

## 2020-03-05 MED ORDER — HEPARIN (PORCINE) IN NACL 1000-0.9 UT/500ML-% IV SOLN
INTRAVENOUS | Status: AC
  Filled 2020-03-05: qty 500

## 2020-03-05 MED ORDER — IODIXANOL 320 MG/ML IV SOLN
INTRAVENOUS | Status: DC | PRN
  Administered 2020-03-05: 15 mL

## 2020-03-05 MED ORDER — SODIUM CHLORIDE 0.9% FLUSH: 3.0000 mL | Freq: Two times a day (BID) | INTRAVENOUS | Status: DC

## 2020-03-05 MED ORDER — LIDOCAINE HCL (PF) 1 % IJ SOLN
INTRAMUSCULAR | Status: AC
  Filled 2020-03-05: qty 30

## 2020-03-05 MED ORDER — SODIUM CHLORIDE 0.9% FLUSH: 3.0000 mL | INTRAVENOUS | Status: DC | PRN

## 2020-03-05 MED ORDER — HEPARIN (PORCINE) IN NACL 1000-0.9 UT/500ML-% IV SOLN
INTRAVENOUS | Status: DC | PRN
  Administered 2020-03-05: 500 mL

## 2020-03-05 SURGICAL SUPPLY — 9 items
BAG SNAP BAND KOVER 36X36 (MISCELLANEOUS) ×2 IMPLANT
COVER DOME SNAP 22 D (MISCELLANEOUS) ×2 IMPLANT
KIT MICROPUNCTURE NIT STIFF (SHEATH) ×2 IMPLANT
PROTECTION STATION PRESSURIZED (MISCELLANEOUS) ×2
SHEATH PROBE COVER 6X72 (BAG) ×4 IMPLANT
STATION PROTECTION PRESSURIZED (MISCELLANEOUS) ×1 IMPLANT
STOPCOCK MORSE 400PSI 3WAY (MISCELLANEOUS) ×2 IMPLANT
TRAY PV CATH (CUSTOM PROCEDURE TRAY) ×2 IMPLANT
TUBING CIL FLEX 10 FLL-RA (TUBING) ×2 IMPLANT

## 2020-03-05 NOTE — Op Note (Signed)
   PATIENT: David Nguyen      MRN: [id number] DOB: [birth year]    DATE OF PROCEDURE: 03/05/2020  INDICATIONS:    David Nguyen is a 79 y.o. male who is not yet on dialysis.  He has a poor thrill in his fistula.  PROCEDURE:    1.  Ultrasound-guided access to left radiocephalic AV fistula 2.  Fistulogram left radiocephalic AV fistula  SURGEON: Judeth Cornfield. Scot Dock, MD, FACS  ANESTHESIA: Local  EBL: Minimal  TECHNIQUE: The left arm was prepped and draped in the usual sterile fashion.  Under ultrasound guidance, after the skin was anesthetized, I cannulated the proximal fistula with a micropuncture needle and a micropuncture sheath was introduced over a wire.  By ultrasound the vein was patent here.  A real-time image was sent to the server.  Contrast was injected through the sheath.  This was half-strength contrast.  Next the blood pressure was inflated on the proximal forearm and a retrograde shot performed to evaluate the arterial anastomosis.  Next one further shot obtained further up the arm was obtained.  In order to limit contrast a central venogram was not obtained.  FINDINGS:   1.  Patent left radiocephalic AV fistula.  Diffuse intimal hyperplasia in the proximal fistula.  The forearm cephalic vein is patent and empties into the basilic system.  The upper arm cephalic vein is occluded. 2.  A total of 15 cc of contrast was used.  CLINICAL NOTE: The patient will be scheduled for revision of the fistula.  I would recommend reanastomosing the fistula to the radial artery above the area of intimal hyperplasia in the fistula.    Deitra Mayo, MD, FACS [facility]  DATE OF DICTATION:   03/05/2020

## 2020-03-05 NOTE — Discharge Instructions (Signed)
° °  [facility] ° °Discharge Instructions ° °AV Fistula or Graft Surgery for Dialysis Access ° °Please refer to the following instructions for your post-procedure care. Your surgeon or physician assistant will discuss any changes with you. ° °Activity ° °You may drive the day following your surgery, if you are comfortable and no longer taking prescription pain medication. Resume full activity as the soreness in your incision resolves. ° °Bathing/Showering ° °You may shower after you go home. Keep your incision dry for 48 hours. Do not soak in a bathtub, hot tub, or swim until the incision heals completely. You may not shower if you have a hemodialysis catheter. ° °Incision Care ° °Clean your incision with mild soap and water after 48 hours. Pat the area dry with a clean towel. You do not need a bandage unless otherwise instructed. Do not apply any ointments or creams to your incision. You may have skin glue on your incision. Do not peel it off. It will come off on its own in about one week. Your arm may swell a bit after surgery. To reduce swelling use pillows to elevate your arm so it is above your heart. Your doctor will tell you if you need to lightly wrap your arm with an ACE bandage. ° °Diet ° °Resume your normal diet. There are not special food restrictions following this procedure. In order to heal from your surgery, it is CRITICAL to get adequate nutrition. Your body requires vitamins, minerals, and protein. Vegetables are the best source of vitamins and minerals. Vegetables also provide the perfect balance of protein. Processed food has little nutritional value, so try to avoid this. ° °Medications ° °Resume taking all of your medications. If your incision is causing pain, you may take over-the counter pain relievers such as acetaminophen (Tylenol). If you were prescribed a stronger pain medication, please be aware these medications can cause nausea and constipation. Prevent  nausea by taking the medication with a snack or meal. Avoid constipation by drinking plenty of fluids and eating foods with high amount of fiber, such as fruits, vegetables, and grains. Do not take Tylenol if you are taking prescription pain medications. ° ° ° ° °Follow up °Your surgeon may want to see you in the office following your access surgery. If so, this will be arranged at the time of your surgery. ° °Please call us immediately for any of the following conditions: ° °Increased pain, redness, drainage (pus) from your incision site °Fever of 101 degrees or higher °Severe or worsening pain at your incision site °Hand pain or numbness. ° °Reduce your risk of vascular disease: ° °Stop smoking. If you would like help, call [facility] at 1-800-QUIT-NOW (1-800-784-8669) or Center at 336-586-4000 ° °Manage your cholesterol °Maintain a desired weight °Control your diabetes °Keep your blood pressure down ° °Dialysis ° °It will take several weeks to several months for your new dialysis access to be ready for use. Your surgeon will determine when it is OK to use it. Your nephrologist will continue to direct your dialysis. You can continue to use your Permcath until your new access is ready for use. ° °If you have any questions, please call the office at [phone]. ° °

## 2020-03-05 NOTE — H&P (Signed)
ASSESSMENT & PLAN   STAGE V CHRONIC KIDNEY DISEASE: This patient is not on dialysis.  He has a weak thrill in his fistula and presents for a fistulogram.  I discussed the indications for the procedure and the potential complications with the patient and he is agreeable to proceed.  We will obviously try to limit contrast.  REASON FOR CONSULT:    Weak thrill in left AV fistula.  The consult is requested by nephrology.  HPI:   David Nguyen is a 79 y.o. male who is not yet on dialysis.  He had a left radiocephalic fistula superficialization on 07/11/2018.  The original fistula was placed on 05/06/2018.  He is not on dialysis.  He does not have a pacemaker.  He is not on blood thinners.  Past Medical History:  Diagnosis Date  . Allergic rhinitis   . Anemia    low iron  . Bradycardia 03/2019  . Chronic kidney disease    Followed by Dr Deterding    . Diabetes mellitus    Type II  . Horseshoe kidney   . Hyperlipidemia   . Hypertension   . Hypothyroidism   . Sleep apnea    uses a cpap    Family History  Problem Relation Age of Onset  . Prostate cancer Father   . Kidney failure Mother   . Lung cancer Sister     SOCIAL HISTORY: Social History   Tobacco Use  . Smoking status: Former Smoker    Packs/day: 0.30    Years: 3.00    Pack years: 0.90    Types: Cigarettes    Quit date: 10/09/1968    Years since quitting: 51.4  . Smokeless tobacco: Never Used  Substance Use Topics  . Alcohol use: No    Allergies  Allergen Reactions  . Penicillins Rash and Other (See Comments)    Childhood allergy       Current Facility-Administered Medications  Medication Dose Route Frequency Provider Last Rate Last Admin  . 0.9 %  sodium chloride infusion  250 mL Intravenous PRN Angelia Mould, MD      . sodium chloride flush (NS) 0.9 % injection 3 mL  3 mL Intravenous Q12H Angelia Mould, MD      . sodium chloride flush (NS) 0.9 % injection 3 mL  3 mL Intravenous PRN  Angelia Mould, MD        REVIEW OF SYSTEMS:  [X]  denotes positive finding, [ ]  denotes negative finding Cardiac  Comments:  Chest pain or chest pressure:    Shortness of breath upon exertion:    Short of breath when lying flat:    Irregular heart rhythm:        Vascular    Pain in calf, thigh, or hip brought on by ambulation:    Pain in feet at night that wakes you up from your sleep:     Blood clot in your veins:    Leg swelling:         Pulmonary    Oxygen at home:    Productive cough:     Wheezing:         Neurologic    Sudden weakness in arms or legs:     Sudden numbness in arms or legs:     Sudden onset of difficulty speaking or slurred speech:    Temporary loss of vision in one eye:     Problems with dizziness:  Gastrointestinal    Blood in stool:     Vomited blood:         Genitourinary    Burning when urinating:     Blood in urine:        Psychiatric    Major depression:         Hematologic    Bleeding problems:    Problems with blood clotting too easily:        Skin    Rashes or ulcers:        Constitutional    Fever or chills:    -  PHYSICAL EXAM:   Vitals:   03/05/20 0544  BP: (!) 161/60  Pulse: 68  Resp: 16  Temp: (!) 97.5 F (36.4 C)  TempSrc: Skin  SpO2: 99%  Weight: 122.5 kg  Height: 6' (1.829 m)   Body mass index is 36.62 kg/m. GENERAL: The patient is a well-nourished male, in no acute distress. The vital signs are documented above. CARDIAC: There is a regular rate and rhythm.  VASCULAR: He has a palpable thrill in his left radiocephalic AV fistula. PULMONARY: There is good air exchange bilaterally without wheezing or rales. ABDOMEN: Soft and non-tender with normal pitched bowel sounds.  MUSCULOSKELETAL: There are no major deformities. NEUROLOGIC: No focal weakness or paresthesias are detected. SKIN: There are no ulcers or rashes noted. PSYCHIATRIC: The patient has a normal affect.  DATA:    Lab Results    Component Value Date   WBC 8.0 04/05/2019   HGB 12.9 (L) 03/05/2020   HCT 38.0 (L) 03/05/2020   MCV 83.6 04/05/2019   PLT 183 04/05/2019   Lab Results  Component Value Date   NA 141 03/05/2020   K 4.0 03/05/2020   CL 103 03/05/2020   CO2 21 (L) 10/21/2019   Lab Results  Component Value Date   CREATININE 4.90 (H) 03/05/2020    Deitra Mayo [facility] [place]: [phone] Office: [phone]

## 2020-03-05 NOTE — H&P (View-Only) (Signed)
ASSESSMENT & PLAN   STAGE V CHRONIC KIDNEY DISEASE: This patient is not on dialysis.  He has a weak thrill in his fistula and presents for a fistulogram.  I discussed the indications for the procedure and the potential complications with the patient and he is agreeable to proceed.  We will obviously try to limit contrast.  REASON FOR CONSULT:    Weak thrill in left AV fistula.  The consult is requested by nephrology.  HPI:   David Nguyen is a 79 y.o. male who is not yet on dialysis.  He had a left radiocephalic fistula superficialization on 07/11/2018.  The original fistula was placed on 05/06/2018.  He is not on dialysis.  He does not have a pacemaker.  He is not on blood thinners.  Past Medical History:  Diagnosis Date  . Allergic rhinitis   . Anemia    low iron  . Bradycardia 03/2019  . Chronic kidney disease    Followed by Dr Deterding    . Diabetes mellitus    Type II  . Horseshoe kidney   . Hyperlipidemia   . Hypertension   . Hypothyroidism   . Sleep apnea    uses a cpap    Family History  Problem Relation Age of Onset  . Prostate cancer Father   . Kidney failure Mother   . Lung cancer Sister     SOCIAL HISTORY: Social History   Tobacco Use  . Smoking status: Former Smoker    Packs/day: 0.30    Years: 3.00    Pack years: 0.90    Types: Cigarettes    Quit date: 10/09/1968    Years since quitting: 51.4  . Smokeless tobacco: Never Used  Substance Use Topics  . Alcohol use: No    Allergies  Allergen Reactions  . Penicillins Rash and Other (See Comments)    Childhood allergy       Current Facility-Administered Medications  Medication Dose Route Frequency Provider Last Rate Last Admin  . 0.9 %  sodium chloride infusion  250 mL Intravenous PRN Angelia Mould, MD      . sodium chloride flush (NS) 0.9 % injection 3 mL  3 mL Intravenous Q12H Angelia Mould, MD      . sodium chloride flush (NS) 0.9 % injection 3 mL  3 mL Intravenous PRN  Angelia Mould, MD        REVIEW OF SYSTEMS:  [X]  denotes positive finding, [ ]  denotes negative finding Cardiac  Comments:  Chest pain or chest pressure:    Shortness of breath upon exertion:    Short of breath when lying flat:    Irregular heart rhythm:        Vascular    Pain in calf, thigh, or hip brought on by ambulation:    Pain in feet at night that wakes you up from your sleep:     Blood clot in your veins:    Leg swelling:         Pulmonary    Oxygen at home:    Productive cough:     Wheezing:         Neurologic    Sudden weakness in arms or legs:     Sudden numbness in arms or legs:     Sudden onset of difficulty speaking or slurred speech:    Temporary loss of vision in one eye:     Problems with dizziness:  Gastrointestinal    Blood in stool:     Vomited blood:         Genitourinary    Burning when urinating:     Blood in urine:        Psychiatric    Major depression:         Hematologic    Bleeding problems:    Problems with blood clotting too easily:        Skin    Rashes or ulcers:        Constitutional    Fever or chills:    -  PHYSICAL EXAM:   Vitals:   03/05/20 0544  BP: (!) 161/60  Pulse: 68  Resp: 16  Temp: (!) 97.5 F (36.4 C)  TempSrc: Skin  SpO2: 99%  Weight: 122.5 kg  Height: 6' (1.829 m)   Body mass index is 36.62 kg/m. GENERAL: The patient is a well-nourished male, in no acute distress. The vital signs are documented above. CARDIAC: There is a regular rate and rhythm.  VASCULAR: He has a palpable thrill in his left radiocephalic AV fistula. PULMONARY: There is good air exchange bilaterally without wheezing or rales. ABDOMEN: Soft and non-tender with normal pitched bowel sounds.  MUSCULOSKELETAL: There are no major deformities. NEUROLOGIC: No focal weakness or paresthesias are detected. SKIN: There are no ulcers or rashes noted. PSYCHIATRIC: The patient has a normal affect.  DATA:    Lab Results    Component Value Date   WBC 8.0 04/05/2019   HGB 12.9 (L) 03/05/2020   HCT 38.0 (L) 03/05/2020   MCV 83.6 04/05/2019   PLT 183 04/05/2019   Lab Results  Component Value Date   NA 141 03/05/2020   K 4.0 03/05/2020   CL 103 03/05/2020   CO2 21 (L) 10/21/2019   Lab Results  Component Value Date   CREATININE 4.90 (H) 03/05/2020    Deitra Mayo [facility] [place]: [phone] Office: [phone]

## 2020-03-11 ENCOUNTER — Other Ambulatory Visit: Payer: Self-pay

## 2020-03-11 ENCOUNTER — Encounter (HOSPITAL_COMMUNITY): Payer: Self-pay | Admitting: Vascular Surgery

## 2020-03-11 ENCOUNTER — Other Ambulatory Visit (HOSPITAL_COMMUNITY)
Admission: RE | Admit: 2020-03-11 | Discharge: 2020-03-11 | Disposition: A | Discharge status: Home / Self Care | Payer: Medicare HMO | Source: Ambulatory Visit | Attending: Vascular Surgery | Admitting: Vascular Surgery

## 2020-03-11 DIAGNOSIS — Z20822 Contact with and (suspected) exposure to covid-19: Secondary | ICD-10-CM | POA: Insufficient documentation

## 2020-03-11 DIAGNOSIS — Z01812 Encounter for preprocedural laboratory examination: Secondary | ICD-10-CM | POA: Diagnosis not present

## 2020-03-11 LAB — SARS CORONAVIRUS 2 (TAT 6-24 HRS): SARS Coronavirus 2: NEGATIVE

## 2020-03-11 NOTE — Progress Notes (Signed)
Spoke with pt for pre-op call. Pt sees Dr. [name] for hx of symptomatic bradycardia and pulmonary HTN. Pt denies any recent chest pain or sob. Pt is a type 2 diabetic. States Dr. Dagmar Hait checked his A1C in the last couple of months and it was "6 something". Pt states his blood sugar is usually in the 70's but can drop into the 30's. Pt had 2 different Humulin 70/30 listed on his med list. He states he takes the Humulin N and takes 18 units in the AM and 16 units at night. Instructed him to take 1/2 of his regular dose tonight (will take 8 units) and 1/2 of his regular dose in the AM (will take 9 units). Instructed pt to check his blood sugar in the AM when he gets up. If blood sugar is 70 or below, treat with 1/2 cup of clear juice (apple or cranberry) and recheck blood sugar 15 minutes after drinking juice. Pt voiced understanding.  Covid test done today and it's negative. Pt states he's been in quarantine since the test was done and understands that he stays in quarantine until he comes to the hospital in the morning.

## 2020-03-11 NOTE — Anesthesia Preprocedure Evaluation (Addendum)
Anesthesia Evaluation  Patient identified by MRN, date of birth, ID band Patient awake    Reviewed: Allergy & Precautions, H&P , NPO status , Patient's Chart, lab work & pertinent test results  Airway Mallampati: II   Neck ROM: full    Dental   Pulmonary sleep apnea , former smoker,    breath sounds clear to auscultation       Cardiovascular hypertension, +CHF   Rhythm:regular Rate:Normal     Neuro/Psych    GI/Hepatic   Endo/Other  diabetesHypothyroidism   Renal/GU ESRFRenal disease     Musculoskeletal  (+) Arthritis ,   Abdominal   Peds  Hematology   Anesthesia Other Findings   Reproductive/Obstetrics                            Anesthesia Physical Anesthesia Plan  ASA: III  Anesthesia Plan: MAC   Post-op Pain Management:    Induction: Intravenous  PONV Risk Score and Plan: 1 and Propofol infusion, Ondansetron and Treatment may vary due to age or medical condition  Airway Management Planned: Simple Face Mask  Additional Equipment:   Intra-op Plan:   Post-operative Plan:   Informed Consent: I have reviewed the patients History and Physical, chart, labs and discussed the procedure including the risks, benefits and alternatives for the proposed anesthesia with the patient or authorized representative who has indicated his/her understanding and acceptance.       Plan Discussed with: CRNA, Anesthesiologist and Surgeon  Anesthesia Plan Comments: (PAT note written 03/11/2020 by Myra Gianotti, PA-C. )       Anesthesia Quick Evaluation

## 2020-03-11 NOTE — Progress Notes (Signed)
Anesthesia Chart Review: David Nguyen   Case: [id number] Date/Time: 03/12/20 0715   Procedure: REVISION OF LEFT RADIOCEPHALIC FISTULA (Left )   Anesthesia type: Monitor Anesthesia Care   Pre-op diagnosis: COMPLICATION OF ARTERIOVENOUS FISTULA   Location: [facility] OR ROOM 11 / [place] [facility]   Surgeons: [name], MD      DISCUSSION: Patient is an 79 year old male scheduled for the above procedure. He has a left radiocepahalic AVF (created 3875) with a poor thrill. He is not yet on hemodialysis. 03/05/20 fistulogram showed patent AVF, but with diffuse intimal hyperplasia in the proximal fistula; The forearm cephalic vein is patent and empties into the basilic system; The upper arm cephalic vein is occluded. Revision recommended.   History includes former smoker (quit 1970), HLD, HTN, CKD, horseshoe kidney, OSA (CPAP), hypothyroidism, anemia, junctional bradycardia (03/2019), adrenalectomy (1993), moderate pulmonary hypertension (by 2020 echo, RVSP 57).   Last evaluation by cardiologist Dr. [name] on 12/30/19.  No longer having symptomatic bradycardia.  Heart rate in the 60s on low-dose carvedilol.  1 year follow-up recommended.  Reported A1c in the 6 range a few months ago at Dr. Danna Hefty office.  He is a same-day work-up, so he will get labs and anesthesia team evaluation on the day of surgery.  03/11/2020 presurgical COVID-19 test negative.  VS:  BP Readings from Last 3 Encounters:  03/05/20 (!) 148/57  02/24/20 (!) 171/68  02/03/20 (!) 177/79   Pulse Readings from Last 3 Encounters:  03/05/20 72  02/24/20 64  02/03/20 70    PROVIDERS: [name], MD is PCP Quay Burow, MD is cardiologist Kara Mead, MD is pulmonologist. Last evaluation 09/17/18 for OSA follow-up.  Mauricia Area, MD is nephrologist   LABS: For day of surgery.    IMAGES: Renal US 04/01/19: IMPRESSION: 1. 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 2. No evidence of renal obstruction.  Two subcentimeter renal cysts.    EKG: 12/05/19 ([facility]): Normal sinus rhythm Right bundle branch block Left anterior fascicular block Bifascicular block Possible lateral infarct, age undetermined   CV: Echo 04/01/19: IMPRESSIONS   1. The left ventricle has hyperdynamic systolic function, with an  ejection fraction of >65%. The cavity size was decreased. There is  moderate concentric left ventricular hypertrophy. Left ventricular  diastolic Doppler parameters are consistent with  restrictive filling. Elevated left atrial and left ventricular  end-diastolic pressures.  2. The right ventricle has normal systolic function. The cavity was  normal. There is no increase in right ventricular wall thickness. Right  ventricular systolic pressure moderate to severely elevated.  3. Left atrial size was severely dilated.  4. Right atrial size was moderately dilated.  5. Small pericardial effusion.  6. The pericardial effusion is posterior to the left ventricle.  7. Mild thickening of the mitral valve leaflet. There is moderate mitral  annular calcification present. No evidence of mitral valve stenosis.  8. The aortic valve is tricuspid. Mild calcification of the aortic valve.  Aortic valve regurgitation is trivial by color flow Doppler. Mild stenosis  of the aortic valve.  9. The aortic arch and aortic root are normal in size and structure.  10. The inferior vena cava was dilated in size with <50% respiratory  variability.  11. There is right bowing of the interatrial septum, suggestive of  elevated left atrial pressure.  SUMMARY  Hyperdynamic LV, with at least moderate LVH and grade 3 diastolic  dysfunction. Biatrial enlargement, elevated LAP and LVEDP. Moderate  to severe pulmonary hypertension. Small pericardial effusion without  echo evidence of tamponade, best seen posterior to LV.    Past  Medical History:  Diagnosis Date  . Allergic rhinitis   . Anemia    low iron  . Bradycardia 03/2019  . Chronic kidney disease    Followed by Dr Deterding    . Diabetes mellitus    Type II  . Horseshoe kidney   . Hyperlipidemia   . Hypertension   . Hypothyroidism   . Sleep apnea    uses a cpap    Past Surgical History:  Procedure Laterality Date  . A/V FISTULAGRAM Left 03/05/2020   Procedure: A/V FISTULAGRAM - Left Arm;  Surgeon: Angelia Mould, MD;  Location: [place] [facility];  Service: Cardiovascular;  Laterality: Left;  . ADRENALECTOMY  1993   left  . AV FISTULA PLACEMENT Left 05/06/2018   Procedure: ARTERIOVENOUS (AV) FISTULA CREATION RADIOCEPHALIC;  Surgeon: [name], MD;  Location: [place];  Service: Vascular;  Laterality: Left;  . COLONOSCOPY W/ POLYPECTOMY    . REVISION OF ARTERIOVENOUS GORETEX GRAFT Left 07/11/2018   Procedure: TRANSPOSITION OF RADIOCEPHALIC  ARTERIOVENOUS FISTULA LEFT ARM;  Surgeon: [name], MD;  Location: [facility];  Service: Vascular;  Laterality: Left;    MEDICATIONS: No current facility-administered medications for this encounter.   Marland Kitchen ACCU-CHEK GUIDE test strip  . allopurinol (ZYLOPRIM) 100 MG tablet  . amLODipine (NORVASC) 10 MG tablet  . aspirin EC 81 MG tablet  . atorvastatin (LIPITOR) 40 MG tablet  . calcitRIOL (ROCALTROL) 0.25 MCG capsule  . carvedilol (COREG) 3.125 MG tablet  . doxazosin (CARDURA) 8 MG tablet  . DROPLET PEN NEEDLES 31G X 8 MM MISC  . fluticasone (FLONASE) 50 MCG/ACT nasal spray  . furosemide (LASIX) 80 MG tablet  . Homeopathic Products [facility] ALLERGY EYE RELIEF) SOLN  . Insulin Isophane & Regular Human (HUMULIN 70/30 MIX) (70-30) 100 UNIT/ML PEN  . insulin NPH-regular Human (70-30) 100 UNIT/ML injection  . levothyroxine (SYNTHROID, LEVOTHROID) 75 MCG tablet  . lisinopril (ZESTRIL) 40 MG tablet  . loratadine (CLARITIN) 10 MG tablet  . Multiple Vitamin (MULTIVITAMIN WITH MINERALS) TABS tablet  .  PRESCRIPTION MEDICATION    Myra Gianotti, PA-C Surgical Short Stay/Anesthesiology [facility] Phone [phone] [facility] Phone [phone] 03/11/2020 6:54 PM

## 2020-03-12 ENCOUNTER — Encounter (HOSPITAL_COMMUNITY): Payer: Self-pay | Admitting: Vascular Surgery

## 2020-03-12 ENCOUNTER — Ambulatory Visit (HOSPITAL_COMMUNITY)
Admission: RE | Admit: 2020-03-12 | Discharge: 2020-03-12 | Disposition: A | Discharge status: Home / Self Care | Payer: Medicare HMO | Attending: Vascular Surgery | Admitting: Vascular Surgery

## 2020-03-12 ENCOUNTER — Other Ambulatory Visit: Payer: Self-pay

## 2020-03-12 ENCOUNTER — Ambulatory Visit (HOSPITAL_COMMUNITY): Payer: Medicare HMO | Admitting: Vascular Surgery

## 2020-03-12 ENCOUNTER — Encounter (HOSPITAL_COMMUNITY)
Admission: RE | Disposition: A | Discharge status: Home / Self Care | Payer: Self-pay | Source: Home / Self Care | Attending: Vascular Surgery

## 2020-03-12 DIAGNOSIS — E785 Hyperlipidemia, unspecified: Secondary | ICD-10-CM | POA: Diagnosis not present

## 2020-03-12 DIAGNOSIS — E1122 Type 2 diabetes mellitus with diabetic chronic kidney disease: Secondary | ICD-10-CM | POA: Diagnosis not present

## 2020-03-12 DIAGNOSIS — N185 Chronic kidney disease, stage 5: Secondary | ICD-10-CM | POA: Insufficient documentation

## 2020-03-12 DIAGNOSIS — I5032 Chronic diastolic (congestive) heart failure: Secondary | ICD-10-CM | POA: Diagnosis not present

## 2020-03-12 DIAGNOSIS — Z79899 Other long term (current) drug therapy: Secondary | ICD-10-CM | POA: Diagnosis not present

## 2020-03-12 DIAGNOSIS — Y832 Surgical operation with anastomosis, bypass or graft as the cause of abnormal reaction of the patient, or of later complication, without mention of misadventure at the time of the procedure: Secondary | ICD-10-CM | POA: Diagnosis not present

## 2020-03-12 DIAGNOSIS — Z7982 Long term (current) use of aspirin: Secondary | ICD-10-CM | POA: Diagnosis not present

## 2020-03-12 DIAGNOSIS — G4730 Sleep apnea, unspecified: Secondary | ICD-10-CM | POA: Diagnosis not present

## 2020-03-12 DIAGNOSIS — I132 Hypertensive heart and chronic kidney disease with heart failure and with stage 5 chronic kidney disease, or end stage renal disease: Secondary | ICD-10-CM | POA: Diagnosis not present

## 2020-03-12 DIAGNOSIS — Z87891 Personal history of nicotine dependence: Secondary | ICD-10-CM | POA: Insufficient documentation

## 2020-03-12 DIAGNOSIS — E039 Hypothyroidism, unspecified: Secondary | ICD-10-CM | POA: Diagnosis not present

## 2020-03-12 DIAGNOSIS — Z880 Allergy status to penicillin: Secondary | ICD-10-CM | POA: Diagnosis not present

## 2020-03-12 DIAGNOSIS — Z794 Long term (current) use of insulin: Secondary | ICD-10-CM | POA: Insufficient documentation

## 2020-03-12 DIAGNOSIS — I272 Pulmonary hypertension, unspecified: Secondary | ICD-10-CM | POA: Insufficient documentation

## 2020-03-12 DIAGNOSIS — T82598A Other mechanical complication of other cardiac and vascular devices and implants, initial encounter: Secondary | ICD-10-CM | POA: Diagnosis not present

## 2020-03-12 DIAGNOSIS — T8241XA Breakdown (mechanical) of vascular dialysis catheter, initial encounter: Secondary | ICD-10-CM | POA: Insufficient documentation

## 2020-03-12 DIAGNOSIS — T82898A Other specified complication of vascular prosthetic devices, implants and grafts, initial encounter: Secondary | ICD-10-CM | POA: Diagnosis not present

## 2020-03-12 DIAGNOSIS — M1990 Unspecified osteoarthritis, unspecified site: Secondary | ICD-10-CM | POA: Diagnosis not present

## 2020-03-12 DIAGNOSIS — N186 End stage renal disease: Secondary | ICD-10-CM | POA: Diagnosis not present

## 2020-03-12 HISTORY — PX: REVISION OF ARTERIOVENOUS GORETEX GRAFT: SHX6073

## 2020-03-12 LAB — GLUCOSE, CAPILLARY
Glucose-Capillary: 81 mg/dL (ref 70–99)
Glucose-Capillary: 58 mg/dL — ABNORMAL LOW (ref 70–99)
Glucose-Capillary: 61 mg/dL — ABNORMAL LOW (ref 70–99)
Glucose-Capillary: 101 mg/dL — ABNORMAL HIGH (ref 70–99)
Glucose-Capillary: 65 mg/dL — ABNORMAL LOW (ref 70–99)

## 2020-03-12 LAB — POCT I-STAT, CHEM 8
BUN: 58 mg/dL — ABNORMAL HIGH (ref 8–23)
Calcium, Ion: 1.28 mmol/L (ref 1.15–1.40)
Chloride: 105 mmol/L (ref 98–111)
Creatinine, Ser: 5.1 mg/dL — ABNORMAL HIGH (ref 0.61–1.24)
Glucose, Bld: 83 mg/dL (ref 70–99)
HCT: 36 % — ABNORMAL LOW (ref 39.0–52.0)
Hemoglobin: 12.2 g/dL — ABNORMAL LOW (ref 13.0–17.0)
Potassium: 3.7 mmol/L (ref 3.5–5.1)
Sodium: 139 mmol/L (ref 135–145)
TCO2: 22 mmol/L (ref 22–32)

## 2020-03-12 SURGERY — REVISION OF ARTERIOVENOUS GORETEX GRAFT
Anesthesia: Monitor Anesthesia Care | Laterality: Left

## 2020-03-12 MED ORDER — PROPOFOL 500 MG/50ML IV EMUL
INTRAVENOUS | Status: DC | PRN
  Administered 2020-03-12: 85 ug/kg/min via INTRAVENOUS

## 2020-03-12 MED ORDER — CHLORHEXIDINE GLUCONATE 4 % EX LIQD: 60.0000 mL | Freq: Once | CUTANEOUS | Status: DC

## 2020-03-12 MED ORDER — PROPOFOL 10 MG/ML IV BOLUS
INTRAVENOUS | Status: AC
  Filled 2020-03-12: qty 20

## 2020-03-12 MED ORDER — ONDANSETRON HCL 4 MG/2ML IJ SOLN: 4.0000 mg | Freq: Four times a day (QID) | INTRAMUSCULAR | Status: DC | PRN

## 2020-03-12 MED ORDER — OXYCODONE HCL 5 MG/5ML PO SOLN: 5.0000 mg | Freq: Once | ORAL | Status: DC | PRN

## 2020-03-12 MED ORDER — LIDOCAINE 2% (20 MG/ML) 5 ML SYRINGE
INTRAMUSCULAR | Status: AC
  Filled 2020-03-12: qty 5

## 2020-03-12 MED ORDER — FENTANYL CITRATE (PF) 250 MCG/5ML IJ SOLN
INTRAMUSCULAR | Status: AC
  Filled 2020-03-12: qty 5

## 2020-03-12 MED ORDER — VANCOMYCIN HCL 1500 MG/300ML IV SOLN
1500.0000 mg | INTRAVENOUS | Status: AC
  Administered 2020-03-12: 1500 mg via INTRAVENOUS
  Filled 2020-03-12 (×2): qty 300

## 2020-03-12 MED ORDER — OXYCODONE HCL 5 MG PO TABS: 5.0000 mg | ORAL_TABLET | Freq: Once | ORAL | Status: DC | PRN

## 2020-03-12 MED ORDER — SODIUM CHLORIDE 0.9 % IV SOLN: INTRAVENOUS | Status: DC | PRN

## 2020-03-12 MED ORDER — ONDANSETRON HCL 4 MG/2ML IJ SOLN
INTRAMUSCULAR | Status: DC | PRN
  Administered 2020-03-12: 4 mg via INTRAVENOUS

## 2020-03-12 MED ORDER — FENTANYL CITRATE (PF) 100 MCG/2ML IJ SOLN: 25.0000 ug | INTRAMUSCULAR | Status: DC | PRN

## 2020-03-12 MED ORDER — ONDANSETRON HCL 4 MG/2ML IJ SOLN
INTRAMUSCULAR | Status: AC
  Filled 2020-03-12: qty 2

## 2020-03-12 MED ORDER — LIDOCAINE-EPINEPHRINE 0.5 %-1:200000 IJ SOLN
INTRAMUSCULAR | Status: AC
  Filled 2020-03-12: qty 1

## 2020-03-12 MED ORDER — CHLORHEXIDINE GLUCONATE 0.12 % MT SOLN
OROMUCOSAL | Status: AC
  Administered 2020-03-12: 15 mL
  Filled 2020-03-12: qty 15

## 2020-03-12 MED ORDER — LIDOCAINE-EPINEPHRINE 0.5 %-1:200000 IJ SOLN
INTRAMUSCULAR | Status: DC | PRN
  Administered 2020-03-12: 10 mL

## 2020-03-12 MED ORDER — SODIUM CHLORIDE 0.9 % IV SOLN
INTRAVENOUS | Status: DC | PRN
  Administered 2020-03-12: 500 mL

## 2020-03-12 MED ORDER — DEXAMETHASONE SODIUM PHOSPHATE 10 MG/ML IJ SOLN
INTRAMUSCULAR | Status: AC
  Filled 2020-03-12: qty 1

## 2020-03-12 MED ORDER — OXYCODONE-ACETAMINOPHEN 5-325 MG PO TABS: 1.0000 | ORAL_TABLET | Freq: Four times a day (QID) | ORAL | 0 refills | Status: DC | PRN

## 2020-03-12 MED ORDER — PROPOFOL 1000 MG/100ML IV EMUL
INTRAVENOUS | Status: AC
  Filled 2020-03-12: qty 100

## 2020-03-12 MED ORDER — SODIUM CHLORIDE 0.9 % IV SOLN
INTRAVENOUS | Status: AC
  Filled 2020-03-12: qty 1.2

## 2020-03-12 MED ORDER — SODIUM CHLORIDE 0.9 % IV SOLN: INTRAVENOUS | Status: DC

## 2020-03-12 MED ORDER — FENTANYL CITRATE (PF) 250 MCG/5ML IJ SOLN
INTRAMUSCULAR | Status: DC | PRN
  Administered 2020-03-12: 50 ug via INTRAVENOUS

## 2020-03-12 SURGICAL SUPPLY — 27 items
CANISTER SUCT 3000ML PPV (MISCELLANEOUS) ×3 IMPLANT
CANNULA VESSEL 3MM 2 BLNT TIP (CANNULA) ×3 IMPLANT
CLIP LIGATING EXTRA MED SLVR (CLIP) ×3 IMPLANT
CLIP LIGATING EXTRA SM BLUE (MISCELLANEOUS) ×3 IMPLANT
COVER WAND RF STERILE (DRAPES) ×3 IMPLANT
DECANTER SPIKE VIAL GLASS SM (MISCELLANEOUS) ×3 IMPLANT
DERMABOND ADVANCED (GAUZE/BANDAGES/DRESSINGS) ×2
DERMABOND ADVANCED .7 DNX12 (GAUZE/BANDAGES/DRESSINGS) ×1 IMPLANT
ELECT REM PT RETURN 9FT ADLT (ELECTROSURGICAL) ×3
ELECTRODE REM PT RTRN 9FT ADLT (ELECTROSURGICAL) ×1 IMPLANT
GLOVE SS BIOGEL STRL SZ 7.5 (GLOVE) ×1 IMPLANT
GLOVE SUPERSENSE BIOGEL SZ 7.5 (GLOVE) ×2
GOWN STRL REUS W/ TWL LRG LVL3 (GOWN DISPOSABLE) ×3 IMPLANT
GOWN STRL REUS W/TWL LRG LVL3 (GOWN DISPOSABLE) ×9
KIT BASIN OR (CUSTOM PROCEDURE TRAY) ×3 IMPLANT
KIT TURNOVER KIT B (KITS) ×3 IMPLANT
NEEDLE HYPO 25GX1X1/2 BEV (NEEDLE) ×3 IMPLANT
NS IRRIG 1000ML POUR BTL (IV SOLUTION) ×3 IMPLANT
PACK CV ACCESS (CUSTOM PROCEDURE TRAY) ×3 IMPLANT
PAD ARMBOARD 7.5X6 YLW CONV (MISCELLANEOUS) ×6 IMPLANT
SUT PROLENE 6 0 CC (SUTURE) ×9 IMPLANT
SUT SILK 2 0 PERMA HAND 18 BK (SUTURE) IMPLANT
SUT VIC AB 3-0 SH 27 (SUTURE) ×6
SUT VIC AB 3-0 SH 27X BRD (SUTURE) ×2 IMPLANT
TOWEL GREEN STERILE (TOWEL DISPOSABLE) ×3 IMPLANT
UNDERPAD 30X36 HEAVY ABSORB (UNDERPADS AND DIAPERS) ×3 IMPLANT
WATER STERILE IRR 1000ML POUR (IV SOLUTION) ×3 IMPLANT

## 2020-03-12 NOTE — Anesthesia Postprocedure Evaluation (Signed)
Anesthesia Post Note  Patient: David Nguyen  Procedure(s) Performed: REVISION OF LEFT RADIOCEPHALIC FISTULA (Left )     Patient location during evaluation: PACU Anesthesia Type: MAC Level of consciousness: awake and alert Pain management: pain level controlled Vital Signs Assessment: post-procedure vital signs reviewed and stable Respiratory status: spontaneous breathing, nonlabored ventilation, respiratory function stable and patient connected to nasal cannula oxygen Cardiovascular status: stable and blood pressure returned to baseline Postop Assessment: no apparent nausea or vomiting Anesthetic complications: no    Last Vitals:  Vitals:   03/12/20 0847 03/12/20 0900  BP: 139/78 (!) 145/71  Pulse: 68 68  Resp: 18 16  Temp: 36.4 C   SpO2: 96% 99%    Last Pain:  Vitals:   03/12/20 0900  TempSrc:   PainSc: 0-No pain                 Silvia Markuson S

## 2020-03-12 NOTE — Discharge Instructions (Signed)
   [facility]  Discharge Instructions  AV Fistula or Graft Surgery for Dialysis Access  Please refer to the following instructions for your post-procedure care. Your surgeon or physician assistant will discuss any changes with you.  Activity  You may drive the day following your surgery, if you are comfortable and no longer taking prescription pain medication. Resume full activity as the soreness in your incision resolves.  Bathing/Showering  You may shower after you go home. Keep your incision dry for 48 hours. Do not soak in a bathtub, hot tub, or swim until the incision heals completely. You may not shower if you have a hemodialysis catheter.  Incision Care  Clean your incision with mild soap and water after 48 hours. Pat the area dry with a clean towel. You do not need a bandage unless otherwise instructed. Do not apply any ointments or creams to your incision. You may have skin glue on your incision. Do not peel it off. It will come off on its own in about one week. Your arm may swell a bit after surgery. To reduce swelling use pillows to elevate your arm so it is above your heart. Your doctor will tell you if you need to lightly wrap your arm with an ACE bandage.  Diet  Resume your normal diet. There are not special food restrictions following this procedure. In order to heal from your surgery, it is CRITICAL to get adequate nutrition. Your body requires vitamins, minerals, and protein. Vegetables are the best source of vitamins and minerals. Vegetables also provide the perfect balance of protein. Processed food has little nutritional value, so try to avoid this.  Medications  Resume taking all of your medications. If your incision is causing pain, you may take over-the counter pain relievers such as acetaminophen (Tylenol). If you were prescribed a stronger pain medication, please be aware these medications can cause nausea and constipation. Prevent  nausea by taking the medication with a snack or meal. Avoid constipation by drinking plenty of fluids and eating foods with high amount of fiber, such as fruits, vegetables, and grains.  Do not take Tylenol if you are taking prescription pain medications.  Follow up Your surgeon may want to see you in the office following your access surgery. If so, this will be arranged at the time of your surgery.  Please call us immediately for any of the following conditions:  . Increased pain, redness, drainage (pus) from your incision site . Fever of 101 degrees or higher . Severe or worsening pain at your incision site . Hand pain or numbness. .  Reduce your risk of vascular disease:  . Stop smoking. If you would like help, call [facility] at 1-800-QUIT-NOW 743-749-4102) or [place] at 563-600-7033  . Manage your cholesterol . Maintain a desired weight . Control your diabetes . Keep your blood pressure down  Dialysis  It will take several weeks to several months for your new dialysis access to be ready for use. Your surgeon will determine when it is okay to use it. Your nephrologist will continue to direct your dialysis. You can continue to use your Permcath until your new access is ready for use.   03/12/2020 RAMAJ FRANGOS [id number] 1940/12/05  Surgeon(s): Early, Arvilla Meres, MD  Procedure(s): REVISION OF LEFT RADIOCEPHALIC FISTULA  x Do not stick fistula for 6 weeks    If you have any questions, please call the office at [phone].

## 2020-03-12 NOTE — Interval H&P Note (Signed)
History and Physical Interval Note:  03/12/2020 7:15 AM  David Nguyen  has presented today for surgery, with the diagnosis of COMPLICATION OF ARTERIOVENOUS FISTULA.  The various methods of treatment have been discussed with the patient and family. After consideration of risks, benefits and other options for treatment, the patient has consented to  Procedure(s): REVISION OF LEFT RADIOCEPHALIC FISTULA (Left) as a surgical intervention.  The patient's history has been reviewed, patient examined, no change in status, stable for surgery.  I have reviewed the patient's chart and labs.  Questions were answered to the patient's satisfaction.     Curt Jews

## 2020-03-12 NOTE — Transfer of Care (Signed)
Immediate Anesthesia Transfer of Care Note  Patient: David Nguyen  Procedure(s) Performed: REVISION OF LEFT RADIOCEPHALIC FISTULA (Left )  Patient Location: PACU  Anesthesia Type:MAC  Level of Consciousness: awake, alert  and oriented  Airway & Oxygen Therapy: Patient Spontanous Breathing  Post-op Assessment: Report given to RN, Post -op Vital signs reviewed and stable and Patient moving all extremities X 4  Post vital signs: Reviewed and stable  Last Vitals:  Vitals Value Taken Time  BP    Temp    Pulse 69 03/12/20 0846  Resp 43 03/12/20 0846  SpO2 96 % 03/12/20 0846  Vitals shown include unvalidated device data.  Last Pain:  Vitals:   03/12/20 0633  TempSrc:   PainSc: 0-No pain         Complications: No apparent anesthesia complications

## 2020-03-12 NOTE — Anesthesia Procedure Notes (Signed)
Procedure Name: MAC Date/Time: 03/12/2020 7:35 AM Performed by: Harden Mo, CRNA Pre-anesthesia Checklist: Patient identified, Emergency Drugs available, Suction available and Patient being monitored Patient Re-evaluated:Patient Re-evaluated prior to induction Oxygen Delivery Method: Simple face mask Preoxygenation: Pre-oxygenation with 100% oxygen Induction Type: IV induction Placement Confirmation: positive ETCO2 and breath sounds checked- equal and bilateral Dental Injury: Teeth and Oropharynx as per pre-operative assessment

## 2020-03-12 NOTE — Op Note (Signed)
    OPERATIVE REPORT  DATE OF SURGERY: 03/12/2020  PATIENT: David Nguyen, 79 y.o. male MRN: [id number]  DOB: January 14, 1941  PRE-OPERATIVE DIAGNOSIS: Chronic renal insufficiency with poorly functioning left radiocephalic AV fistula  POST-OPERATIVE DIAGNOSIS:  Same  PROCEDURE: Revision of left radiocephalic fistula with ligation of the prior anastomosis and reanastomosis of the cephalic vein to more proximal radial artery  SURGEON:  Curt Jews, M.D.  PHYSICIAN ASSISTANT: Liana Crocker, PA-C  ANESTHESIA: Local with sedation  EBL: per anesthesia record  Total I/O In: 150 [I.V.:150] Out: 10 [Blood:10]  BLOOD ADMINISTERED: none  DRAINS: none  SPECIMEN: none  COUNTS CORRECT:  YES  PATIENT DISPOSITION:  PACU - hemodynamically stable  PROCEDURE DETAILS: Patient taken operating placed supine position under the left arm prepped draped in sterile fashion. Patient had a prior sonogram a week ago which showed a very atretic sclerotic area of the cephalic vein near the prior radiocephalic anastomosis. Local incision was used to make an more extension of the original incision more proximally. The vein was isolated at the prior radial artery anastomosis and above the sclerotic area. The vein was ligated at the radial artery anastomosis and divided. The sclerotic area was resected and the vein was of excellent caliber above this. The radial artery was exposed further proximally and was occluded proximally distally with a Serafin clamp. The artery was opened with 11 blade signal change Potts scissors. The vein was sewn end-to-side to the artery with a running 6-0 Prolene suture. Clamps removed and excellent thrill was noted. Wounds irrigated with saline. Hemostasis electrocautery. Wounds were closed with 3-0 Vicryl in the subcutaneous and subcuticular tissue. Sterile dressing was applied and the patient was transferred to the recovery room stable condition   [name], M.D.,  [facility] 03/12/2020 9:00 AM

## 2020-03-16 ENCOUNTER — Other Ambulatory Visit: Payer: Self-pay

## 2020-03-16 ENCOUNTER — Telehealth: Payer: Self-pay

## 2020-03-16 ENCOUNTER — Encounter (HOSPITAL_COMMUNITY)
Admission: RE | Admit: 2020-03-16 | Discharge: 2020-03-16 | Disposition: A | Discharge status: Home / Self Care | Payer: Medicare HMO | Source: Ambulatory Visit | Attending: Nephrology | Admitting: Nephrology

## 2020-03-16 VITALS — BP 156/73 | HR 83 | Temp 97.2°F | Resp 20

## 2020-03-16 DIAGNOSIS — N189 Chronic kidney disease, unspecified: Secondary | ICD-10-CM | POA: Diagnosis not present

## 2020-03-16 DIAGNOSIS — Z862 Personal history of diseases of the blood and blood-forming organs and certain disorders involving the immune mechanism: Secondary | ICD-10-CM | POA: Diagnosis not present

## 2020-03-16 LAB — POCT HEMOGLOBIN-HEMACUE: Hemoglobin: 11.8 g/dL — ABNORMAL LOW (ref 13.0–17.0)

## 2020-03-16 MED ORDER — EPOETIN ALFA-EPBX 10000 UNIT/ML IJ SOLN
INTRAMUSCULAR | Status: AC
  Administered 2020-03-16: 30000 [IU] via SUBCUTANEOUS
  Filled 2020-03-16: qty 3

## 2020-03-16 MED ORDER — EPOETIN ALFA-EPBX 40000 UNIT/ML IJ SOLN: 30000.0000 [IU] | INTRAMUSCULAR | Status: DC

## 2020-03-16 NOTE — Telephone Encounter (Signed)
Pts wife called regarding a dark spot on left arm near surgery site and itching. She denies hardness, raised area or bumps, numbness, fever. Reports good movement of hands and fingers. Advised could be bruising present. If develop any symptoms previously mentioned to contact office. Can also try Bendadryl 25mg  oral every 4-6 hours for itching. Voiced understanding. Minette Brine, RN

## 2020-04-05 ENCOUNTER — Other Ambulatory Visit: Payer: Self-pay | Admitting: *Deleted

## 2020-04-05 DIAGNOSIS — N185 Chronic kidney disease, stage 5: Secondary | ICD-10-CM

## 2020-04-06 ENCOUNTER — Other Ambulatory Visit: Payer: Self-pay

## 2020-04-06 ENCOUNTER — Encounter (HOSPITAL_COMMUNITY)
Admission: RE | Admit: 2020-04-06 | Discharge: 2020-04-06 | Disposition: A | Discharge status: Home / Self Care | Payer: Medicare HMO | Source: Ambulatory Visit | Attending: Nephrology | Admitting: Nephrology

## 2020-04-06 VITALS — BP 153/56 | HR 69 | Resp 16

## 2020-04-06 DIAGNOSIS — N189 Chronic kidney disease, unspecified: Secondary | ICD-10-CM

## 2020-04-06 DIAGNOSIS — Z862 Personal history of diseases of the blood and blood-forming organs and certain disorders involving the immune mechanism: Secondary | ICD-10-CM | POA: Diagnosis not present

## 2020-04-06 LAB — FERRITIN: Ferritin: 377 ng/mL — ABNORMAL HIGH (ref 24–336)

## 2020-04-06 LAB — IRON AND TIBC
Iron: 78 ug/dL (ref 45–182)
Saturation Ratios: 30 % (ref 17.9–39.5)
TIBC: 258 ug/dL (ref 250–450)
UIBC: 180 ug/dL

## 2020-04-06 LAB — POCT HEMOGLOBIN-HEMACUE: Hemoglobin: 12.7 g/dL — ABNORMAL LOW (ref 13.0–17.0)

## 2020-04-06 MED ORDER — EPOETIN ALFA-EPBX 10000 UNIT/ML IJ SOLN: 30000.0000 [IU] | INTRAMUSCULAR | Status: DC

## 2020-04-09 DIAGNOSIS — R630 Anorexia: Secondary | ICD-10-CM | POA: Diagnosis not present

## 2020-04-09 DIAGNOSIS — N184 Chronic kidney disease, stage 4 (severe): Secondary | ICD-10-CM | POA: Diagnosis not present

## 2020-04-09 DIAGNOSIS — I5032 Chronic diastolic (congestive) heart failure: Secondary | ICD-10-CM | POA: Diagnosis not present

## 2020-04-09 DIAGNOSIS — R5383 Other fatigue: Secondary | ICD-10-CM | POA: Diagnosis not present

## 2020-04-09 DIAGNOSIS — I130 Hypertensive heart and chronic kidney disease with heart failure and stage 1 through stage 4 chronic kidney disease, or unspecified chronic kidney disease: Secondary | ICD-10-CM | POA: Diagnosis not present

## 2020-04-13 ENCOUNTER — Ambulatory Visit (INDEPENDENT_AMBULATORY_CARE_PROVIDER_SITE_OTHER): Payer: Self-pay | Admitting: Physician Assistant

## 2020-04-13 ENCOUNTER — Ambulatory Visit (HOSPITAL_COMMUNITY)
Admission: RE | Admit: 2020-04-13 | Discharge: 2020-04-13 | Disposition: A | Discharge status: Home / Self Care | Payer: Medicare HMO | Source: Ambulatory Visit | Attending: Vascular Surgery | Admitting: Vascular Surgery

## 2020-04-13 ENCOUNTER — Encounter: Payer: Self-pay | Admitting: Physician Assistant

## 2020-04-13 ENCOUNTER — Other Ambulatory Visit: Payer: Self-pay

## 2020-04-13 VITALS — BP 158/71 | HR 73 | Temp 97.7°F | Resp 20 | Ht 72.0 in | Wt 271.5 lb

## 2020-04-13 DIAGNOSIS — N185 Chronic kidney disease, stage 5: Secondary | ICD-10-CM | POA: Diagnosis not present

## 2020-04-13 NOTE — Progress Notes (Signed)
POST OPERATIVE OFFICE NOTE    CC:  F/u for surgery  HPI:  This is a 79 y.o. male who is s/p Revision of left radiocephalic fistula with ligation of the prior anastomosis and reanastomosis of the cephalic vein to more proximal radial artery on 03/12/2020 by Dr. Donnetta Hutching due to poorly functioning left radiocephalic AV fistula. He is doing well post op. He is not having any steal symptoms. He denies any pain, numbness, coldness or tissue loss of left hand. Patient is not yet on HD. He has follow up with Nephrologist at [facility] in August.   Allergies  Allergen Reactions  . Penicillins Rash and Other (See Comments)    Childhood allergy       Current Outpatient Medications  Medication Sig Dispense Refill  . ACCU-CHEK GUIDE test strip     . allopurinol (ZYLOPRIM) 100 MG tablet Take 200 mg by mouth daily.     Marland Kitchen amLODipine (NORVASC) 10 MG tablet Take 10 mg by mouth daily in the afternoon.     Marland Kitchen aspirin EC 81 MG tablet Take 81 mg by mouth daily.    Marland Kitchen atorvastatin (LIPITOR) 40 MG tablet Take 40 mg by mouth daily in the afternoon.     . calcitRIOL (ROCALTROL) 0.25 MCG capsule Take 0.25 mcg by mouth daily.     . carvedilol (COREG) 3.125 MG tablet Take 1 tablet (3.125 mg total) by mouth 2 (two) times daily with a meal. 60 tablet 0  . doxazosin (CARDURA) 8 MG tablet Take 8 mg by mouth daily in the afternoon.     . DROPLET PEN NEEDLES 31G X 8 MM MISC     . fluticasone (FLONASE) 50 MCG/ACT nasal spray Place 1 spray into both nostrils daily.   11  . furosemide (LASIX) 80 MG tablet Take 1 tablet (80 mg total) by mouth daily. (Patient taking differently: Take 40-80 mg by mouth See admin instructions. Take 80 mg in the morning and 40 mg in the early afternoon) 30 tablet 0  . Homeopathic Products [facility] ALLERGY EYE RELIEF) SOLN Place 1-2 drops into both eyes daily as needed (for dry eyes).    . Insulin Isophane & Regular Human (HUMULIN 70/30 MIX) (70-30) 100 UNIT/ML PEN Inject 10 units  subcutaneously before breakfast and inject 10 units subcutaneously before supper 15 mL 11  . insulin NPH-regular Human (70-30) 100 UNIT/ML injection Inject 16-18 Units into the skin See admin instructions. Inject 18 units in the morning and 16 units at night    . levothyroxine (SYNTHROID, LEVOTHROID) 75 MCG tablet Take 75 mcg by mouth daily before breakfast.    . lisinopril (ZESTRIL) 40 MG tablet Take 40 mg by mouth daily.    Marland Kitchen loratadine (CLARITIN) 10 MG tablet Take 10 mg by mouth daily.    . Multiple Vitamin (MULTIVITAMIN WITH MINERALS) TABS tablet Take 1 tablet by mouth daily.    Marland Kitchen oxyCODONE-acetaminophen (PERCOCET) 5-325 MG tablet Take 1 tablet by mouth every 6 (six) hours as needed for severe pain. 8 tablet 0  . PRESCRIPTION MEDICATION Inhale into the lungs at bedtime. CPAP     No current facility-administered medications for this visit.     ROS:  See HPI  Physical Exam:  Vitals:   04/13/20 1518  BP: (!) 158/71  Pulse: 73  Resp: 20  Temp: 97.7 F (36.5 C)  SpO2: 96%     Incision:  Left forearm incision well healed. No swelling. No tenderness Extremities:  There is a 2+  palpable radial pulse.  Motor and sensory are in tact.  There a thrill/bruit present. The Left radiocephalic AV fistula is easily palpable in the left forearm  Dialysis Duplex on 04/13/2020:   Findings:  +--------------------+----------+-----------------+--------+  AVF         PSV (cm/s)Flow Vol (mL/min)Comments  +--------------------+----------+-----------------+--------+  Native artery inflow  295      293          +--------------------+----------+-----------------+--------+  AVF Anastomosis     328                 +--------------------+----------+-----------------+--------+   +------------+----------+-------------+----------+--------+  OUTFLOW VEINPSV (cm/s)Diameter (cm)Depth (cm)Describe    +------------+----------+-------------+----------+--------+  Prox Forearm  152    0.58     0.37        +------------+----------+-------------+----------+--------+  Mid Forearm        0.49     0.18        +------------+----------+-------------+----------+--------+  Dist Forearm  300    0.49     0.24        +------------+----------+-------------+----------+--------+  Retrograde radial artery distal to anastomosis.  Mid outflow vein velocity image was lost.    Summary:  Patent left radiocephalic AVF. Low volume flow. Retrograde flow in the radial artery distal to the anastomosis.    Assessment/Plan:  This is a 79 y.o. male who is s/p: Revision of left radiocephalic fistula with ligation of the prior anastomosis and reanastomosis of the cephalic vein to more proximal radial artery on 03/12/2020 by Dr. Donnetta Hutching. His left radiocephalic AV fistula incision is well healed. The non invasive studies today show functioning fistula. No yet as matured as we would like but it is of good depth in the left forearm.  I have encouraged him to continue to exercise left arm  -the pt does not have evidence of steal. -the fistula/graft can be used immediately when needed - advised him to follow up earlier if he develops any concerning steal symptoms or if he has any issues with fistula once he begins HD] -the pt will follow up as needed  Paulo Fruit, PA-C [facility] [phone]  Clinic MD:  Early

## 2020-04-15 DIAGNOSIS — N433 Hydrocele, unspecified: Secondary | ICD-10-CM | POA: Diagnosis not present

## 2020-04-20 ENCOUNTER — Inpatient Hospital Stay (HOSPITAL_COMMUNITY)
Admission: EM | Admit: 2020-04-20 | Discharge: 2020-04-22 | DRG: 291 | Disposition: A | Discharge status: Home Health | Payer: Medicare HMO | Attending: Internal Medicine | Admitting: Internal Medicine

## 2020-04-20 ENCOUNTER — Other Ambulatory Visit: Payer: Self-pay

## 2020-04-20 ENCOUNTER — Encounter (HOSPITAL_COMMUNITY): Payer: Medicare HMO

## 2020-04-20 ENCOUNTER — Telehealth: Payer: Self-pay | Admitting: Cardiovascular Disease

## 2020-04-20 ENCOUNTER — Emergency Department (HOSPITAL_COMMUNITY): Payer: Medicare HMO

## 2020-04-20 ENCOUNTER — Encounter (HOSPITAL_COMMUNITY): Payer: Self-pay | Admitting: Pediatrics

## 2020-04-20 ENCOUNTER — Inpatient Hospital Stay (HOSPITAL_COMMUNITY): Payer: Medicare HMO

## 2020-04-20 DIAGNOSIS — E875 Hyperkalemia: Secondary | ICD-10-CM | POA: Diagnosis not present

## 2020-04-20 DIAGNOSIS — Q631 Lobulated, fused and horseshoe kidney: Secondary | ICD-10-CM | POA: Diagnosis not present

## 2020-04-20 DIAGNOSIS — E1165 Type 2 diabetes mellitus with hyperglycemia: Secondary | ICD-10-CM | POA: Diagnosis present

## 2020-04-20 DIAGNOSIS — N186 End stage renal disease: Secondary | ICD-10-CM | POA: Diagnosis not present

## 2020-04-20 DIAGNOSIS — M1990 Unspecified osteoarthritis, unspecified site: Secondary | ICD-10-CM | POA: Diagnosis present

## 2020-04-20 DIAGNOSIS — I132 Hypertensive heart and chronic kidney disease with heart failure and with stage 5 chronic kidney disease, or end stage renal disease: Secondary | ICD-10-CM | POA: Diagnosis not present

## 2020-04-20 DIAGNOSIS — G4733 Obstructive sleep apnea (adult) (pediatric): Secondary | ICD-10-CM | POA: Diagnosis present

## 2020-04-20 DIAGNOSIS — J811 Chronic pulmonary edema: Secondary | ICD-10-CM | POA: Diagnosis not present

## 2020-04-20 DIAGNOSIS — Z6836 Body mass index (BMI) 36.0-36.9, adult: Secondary | ICD-10-CM

## 2020-04-20 DIAGNOSIS — E118 Type 2 diabetes mellitus with unspecified complications: Secondary | ICD-10-CM | POA: Diagnosis present

## 2020-04-20 DIAGNOSIS — K4090 Unilateral inguinal hernia, without obstruction or gangrene, not specified as recurrent: Secondary | ICD-10-CM | POA: Diagnosis present

## 2020-04-20 DIAGNOSIS — Z8042 Family history of malignant neoplasm of prostate: Secondary | ICD-10-CM

## 2020-04-20 DIAGNOSIS — Z841 Family history of disorders of kidney and ureter: Secondary | ICD-10-CM

## 2020-04-20 DIAGNOSIS — I5033 Acute on chronic diastolic (congestive) heart failure: Secondary | ICD-10-CM | POA: Diagnosis present

## 2020-04-20 DIAGNOSIS — Z880 Allergy status to penicillin: Secondary | ICD-10-CM

## 2020-04-20 DIAGNOSIS — E785 Hyperlipidemia, unspecified: Secondary | ICD-10-CM | POA: Diagnosis not present

## 2020-04-20 DIAGNOSIS — D638 Anemia in other chronic diseases classified elsewhere: Secondary | ICD-10-CM | POA: Diagnosis present

## 2020-04-20 DIAGNOSIS — I10 Essential (primary) hypertension: Secondary | ICD-10-CM | POA: Diagnosis present

## 2020-04-20 DIAGNOSIS — Z5329 Procedure and treatment not carried out because of patient's decision for other reasons: Secondary | ICD-10-CM | POA: Diagnosis not present

## 2020-04-20 DIAGNOSIS — I120 Hypertensive chronic kidney disease with stage 5 chronic kidney disease or end stage renal disease: Secondary | ICD-10-CM | POA: Diagnosis not present

## 2020-04-20 DIAGNOSIS — I5032 Chronic diastolic (congestive) heart failure: Secondary | ICD-10-CM | POA: Diagnosis present

## 2020-04-20 DIAGNOSIS — E1122 Type 2 diabetes mellitus with diabetic chronic kidney disease: Secondary | ICD-10-CM | POA: Diagnosis present

## 2020-04-20 DIAGNOSIS — D631 Anemia in chronic kidney disease: Secondary | ICD-10-CM | POA: Diagnosis not present

## 2020-04-20 DIAGNOSIS — E669 Obesity, unspecified: Secondary | ICD-10-CM | POA: Diagnosis present

## 2020-04-20 DIAGNOSIS — K429 Umbilical hernia without obstruction or gangrene: Secondary | ICD-10-CM | POA: Diagnosis not present

## 2020-04-20 DIAGNOSIS — Z794 Long term (current) use of insulin: Secondary | ICD-10-CM

## 2020-04-20 DIAGNOSIS — I5030 Unspecified diastolic (congestive) heart failure: Secondary | ICD-10-CM | POA: Diagnosis not present

## 2020-04-20 DIAGNOSIS — R001 Bradycardia, unspecified: Secondary | ICD-10-CM | POA: Diagnosis present

## 2020-04-20 DIAGNOSIS — M1712 Unilateral primary osteoarthritis, left knee: Secondary | ICD-10-CM | POA: Diagnosis not present

## 2020-04-20 DIAGNOSIS — Z801 Family history of malignant neoplasm of trachea, bronchus and lung: Secondary | ICD-10-CM

## 2020-04-20 DIAGNOSIS — Z87891 Personal history of nicotine dependence: Secondary | ICD-10-CM

## 2020-04-20 DIAGNOSIS — Z9989 Dependence on other enabling machines and devices: Secondary | ICD-10-CM | POA: Diagnosis present

## 2020-04-20 DIAGNOSIS — I700 Atherosclerosis of aorta: Secondary | ICD-10-CM | POA: Diagnosis not present

## 2020-04-20 DIAGNOSIS — M79605 Pain in left leg: Secondary | ICD-10-CM | POA: Diagnosis present

## 2020-04-20 DIAGNOSIS — K439 Ventral hernia without obstruction or gangrene: Secondary | ICD-10-CM | POA: Diagnosis not present

## 2020-04-20 DIAGNOSIS — E8770 Fluid overload, unspecified: Secondary | ICD-10-CM | POA: Diagnosis not present

## 2020-04-20 DIAGNOSIS — N5089 Other specified disorders of the male genital organs: Secondary | ICD-10-CM | POA: Diagnosis present

## 2020-04-20 DIAGNOSIS — Z79899 Other long term (current) drug therapy: Secondary | ICD-10-CM | POA: Diagnosis not present

## 2020-04-20 DIAGNOSIS — Z20822 Contact with and (suspected) exposure to covid-19: Secondary | ICD-10-CM | POA: Diagnosis present

## 2020-04-20 DIAGNOSIS — E039 Hypothyroidism, unspecified: Secondary | ICD-10-CM | POA: Diagnosis not present

## 2020-04-20 DIAGNOSIS — Z79890 Hormone replacement therapy (postmenopausal): Secondary | ICD-10-CM

## 2020-04-20 DIAGNOSIS — N185 Chronic kidney disease, stage 5: Secondary | ICD-10-CM | POA: Diagnosis present

## 2020-04-20 DIAGNOSIS — R112 Nausea with vomiting, unspecified: Secondary | ICD-10-CM | POA: Diagnosis not present

## 2020-04-20 DIAGNOSIS — N2581 Secondary hyperparathyroidism of renal origin: Secondary | ICD-10-CM | POA: Diagnosis present

## 2020-04-20 DIAGNOSIS — Z7982 Long term (current) use of aspirin: Secondary | ICD-10-CM

## 2020-04-20 DIAGNOSIS — I517 Cardiomegaly: Secondary | ICD-10-CM | POA: Diagnosis not present

## 2020-04-20 DIAGNOSIS — E1129 Type 2 diabetes mellitus with other diabetic kidney complication: Secondary | ICD-10-CM | POA: Diagnosis not present

## 2020-04-20 HISTORY — DX: Other specified disorders of the male genital organs: N50.89

## 2020-04-20 HISTORY — DX: Procedure and treatment not carried out because of patient's decision for reasons of belief and group pressure: Z53.1

## 2020-04-20 HISTORY — DX: Reserved for inherently not codable concepts without codable children: IMO0001

## 2020-04-20 LAB — CBC
HCT: 33.2 % — ABNORMAL LOW (ref 39.0–52.0)
Hemoglobin: 10.3 g/dL — ABNORMAL LOW (ref 13.0–17.0)
MCH: 25.9 pg — ABNORMAL LOW (ref 26.0–34.0)
MCHC: 31 g/dL (ref 30.0–36.0)
MCV: 83.6 fL (ref 80.0–100.0)
Platelets: 203 10*3/uL (ref 150–400)
RBC: 3.97 MIL/uL — ABNORMAL LOW (ref 4.22–5.81)
RDW: 19.5 % — ABNORMAL HIGH (ref 11.5–15.5)
WBC: 7.8 10*3/uL (ref 4.0–10.5)
nRBC: 0 % (ref 0.0–0.2)

## 2020-04-20 LAB — HEMOGLOBIN A1C
Hgb A1c MFr Bld: 6.5 % — ABNORMAL HIGH (ref 4.8–5.6)
Mean Plasma Glucose: 139.85 mg/dL

## 2020-04-20 LAB — SARS CORONAVIRUS 2 BY RT PCR (HOSPITAL ORDER, PERFORMED IN ~~LOC~~ HOSPITAL LAB): SARS Coronavirus 2: NEGATIVE

## 2020-04-20 LAB — TSH: TSH: 4.985 u[IU]/mL — ABNORMAL HIGH (ref 0.350–4.500)

## 2020-04-20 LAB — BASIC METABOLIC PANEL
Anion gap: 13 (ref 5–15)
BUN: 90 mg/dL — ABNORMAL HIGH (ref 8–23)
CO2: 19 mmol/L — ABNORMAL LOW (ref 22–32)
Calcium: 9.2 mg/dL (ref 8.9–10.3)
Chloride: 101 mmol/L (ref 98–111)
Creatinine, Ser: 5.25 mg/dL — ABNORMAL HIGH (ref 0.61–1.24)
GFR calc Af Amer: 11 mL/min — ABNORMAL LOW (ref >60)
GFR calc non Af Amer: 9 mL/min — ABNORMAL LOW (ref >60)
Glucose, Bld: 51 mg/dL — ABNORMAL LOW (ref 70–99)
Potassium: 5.2 mmol/L — ABNORMAL HIGH (ref 3.5–5.1)
Sodium: 133 mmol/L — ABNORMAL LOW (ref 135–145)

## 2020-04-20 LAB — CBG MONITORING, ED
Glucose-Capillary: 53 mg/dL — ABNORMAL LOW (ref 70–99)
Glucose-Capillary: 98 mg/dL (ref 70–99)
Glucose-Capillary: 106 mg/dL — ABNORMAL HIGH (ref 70–99)
Glucose-Capillary: 112 mg/dL — ABNORMAL HIGH (ref 70–99)

## 2020-04-20 LAB — TROPONIN I (HIGH SENSITIVITY)
Troponin I (High Sensitivity): 21 ng/L — ABNORMAL HIGH (ref <18)
Troponin I (High Sensitivity): 21 ng/L — ABNORMAL HIGH (ref <18)

## 2020-04-20 LAB — FERRITIN: Ferritin: 321 ng/mL (ref 24–336)

## 2020-04-20 LAB — IRON AND TIBC
Iron: 68 ug/dL (ref 45–182)
Saturation Ratios: 27 % (ref 17.9–39.5)
TIBC: 251 ug/dL (ref 250–450)
UIBC: 183 ug/dL

## 2020-04-20 MED ORDER — DOXAZOSIN MESYLATE 8 MG PO TABS
8.0000 mg | ORAL_TABLET | Freq: Every day | ORAL | Status: DC
  Administered 2020-04-21 – 2020-04-22 (×2): 8 mg via ORAL
  Filled 2020-04-20 (×2): qty 1

## 2020-04-20 MED ORDER — ATORVASTATIN CALCIUM 40 MG PO TABS
40.0000 mg | ORAL_TABLET | Freq: Every day | ORAL | Status: DC
  Administered 2020-04-21 – 2020-04-22 (×2): 40 mg via ORAL
  Filled 2020-04-20 (×2): qty 1

## 2020-04-20 MED ORDER — SODIUM CHLORIDE 0.9% FLUSH
3.0000 mL | Freq: Two times a day (BID) | INTRAVENOUS | Status: DC
  Administered 2020-04-20 – 2020-04-22 (×4): 3 mL via INTRAVENOUS

## 2020-04-20 MED ORDER — HEPARIN SODIUM (PORCINE) 5000 UNIT/ML IJ SOLN
5000.0000 [IU] | Freq: Three times a day (TID) | INTRAMUSCULAR | Status: DC
  Administered 2020-04-20 – 2020-04-22 (×6): 5000 [IU] via SUBCUTANEOUS
  Filled 2020-04-20 (×6): qty 1

## 2020-04-20 MED ORDER — INSULIN ASPART 100 UNIT/ML ~~LOC~~ SOLN
0.0000 [IU] | Freq: Three times a day (TID) | SUBCUTANEOUS | Status: DC
  Administered 2020-04-21: 1 [IU] via SUBCUTANEOUS
  Administered 2020-04-22: 0 [IU] via SUBCUTANEOUS
  Administered 2020-04-22: 1 [IU] via SUBCUTANEOUS

## 2020-04-20 MED ORDER — FLUTICASONE PROPIONATE 50 MCG/ACT NA SUSP
2.0000 | Freq: Every day | NASAL | Status: DC
  Administered 2020-04-22: 2 via NASAL
  Filled 2020-04-20: qty 16

## 2020-04-20 MED ORDER — ONDANSETRON HCL 4 MG PO TABS: 4.0000 mg | ORAL_TABLET | Freq: Four times a day (QID) | ORAL | Status: DC | PRN

## 2020-04-20 MED ORDER — CAMPHOR-MENTHOL 0.5-0.5 % EX LOTN
1.0000 "application " | TOPICAL_LOTION | Freq: Three times a day (TID) | CUTANEOUS | Status: DC | PRN
  Filled 2020-04-20: qty 222

## 2020-04-20 MED ORDER — INSULIN ASPART PROT & ASPART (70-30 MIX) 100 UNIT/ML ~~LOC~~ SUSP
16.0000 [IU] | Freq: Two times a day (BID) | SUBCUTANEOUS | Status: DC
  Administered 2020-04-22: 16 [IU] via SUBCUTANEOUS
  Filled 2020-04-20: qty 10

## 2020-04-20 MED ORDER — LORATADINE 10 MG PO TABS
10.0000 mg | ORAL_TABLET | Freq: Every day | ORAL | Status: DC
  Administered 2020-04-21 – 2020-04-22 (×2): 10 mg via ORAL
  Filled 2020-04-20 (×2): qty 1

## 2020-04-20 MED ORDER — SORBITOL 70 % SOLN
30.0000 mL | Status: DC | PRN
  Filled 2020-04-20 (×2): qty 30

## 2020-04-20 MED ORDER — ONDANSETRON HCL 4 MG/2ML IJ SOLN
4.0000 mg | Freq: Four times a day (QID) | INTRAMUSCULAR | Status: DC | PRN
  Administered 2020-04-21: 4 mg via INTRAVENOUS
  Filled 2020-04-20: qty 2

## 2020-04-20 MED ORDER — HYDROXYZINE HCL 25 MG PO TABS: 25.0000 mg | ORAL_TABLET | Freq: Three times a day (TID) | ORAL | Status: DC | PRN

## 2020-04-20 MED ORDER — ALLOPURINOL 100 MG PO TABS
200.0000 mg | ORAL_TABLET | Freq: Every day | ORAL | Status: DC
  Administered 2020-04-21 – 2020-04-22 (×2): 200 mg via ORAL
  Filled 2020-04-20 (×2): qty 2

## 2020-04-20 MED ORDER — ASPIRIN EC 81 MG PO TBEC
81.0000 mg | DELAYED_RELEASE_TABLET | Freq: Every day | ORAL | Status: DC
  Administered 2020-04-21 – 2020-04-22 (×2): 81 mg via ORAL
  Filled 2020-04-20 (×2): qty 1

## 2020-04-20 MED ORDER — FUROSEMIDE 10 MG/ML IJ SOLN
60.0000 mg | Freq: Two times a day (BID) | INTRAMUSCULAR | Status: DC
  Administered 2020-04-20 – 2020-04-22 (×4): 60 mg via INTRAVENOUS
  Filled 2020-04-20 (×4): qty 6

## 2020-04-20 MED ORDER — CALCIUM CARBONATE ANTACID 1250 MG/5ML PO SUSP
500.0000 mg | Freq: Four times a day (QID) | ORAL | Status: DC | PRN
  Filled 2020-04-20: qty 5

## 2020-04-20 MED ORDER — ZOLPIDEM TARTRATE 5 MG PO TABS: 5.0000 mg | ORAL_TABLET | Freq: Every evening | ORAL | Status: DC | PRN

## 2020-04-20 MED ORDER — ACETAMINOPHEN 650 MG RE SUPP: 650.0000 mg | Freq: Four times a day (QID) | RECTAL | Status: DC | PRN

## 2020-04-20 MED ORDER — LEVOTHYROXINE SODIUM 75 MCG PO TABS
75.0000 ug | ORAL_TABLET | Freq: Every day | ORAL | Status: DC
  Administered 2020-04-22: 75 ug via ORAL
  Filled 2020-04-20: qty 1

## 2020-04-20 MED ORDER — AMLODIPINE BESYLATE 10 MG PO TABS
10.0000 mg | ORAL_TABLET | Freq: Every day | ORAL | Status: DC
  Administered 2020-04-21 – 2020-04-22 (×2): 10 mg via ORAL
  Filled 2020-04-20: qty 2
  Filled 2020-04-20: qty 1

## 2020-04-20 MED ORDER — CALCITRIOL 0.25 MCG PO CAPS
0.2500 ug | ORAL_CAPSULE | Freq: Every day | ORAL | Status: DC
  Administered 2020-04-21 – 2020-04-22 (×2): 0.25 ug via ORAL
  Filled 2020-04-20 (×3): qty 1

## 2020-04-20 MED ORDER — DOCUSATE SODIUM 283 MG RE ENEM
1.0000 | ENEMA | RECTAL | Status: DC | PRN
  Filled 2020-04-20: qty 1

## 2020-04-20 MED ORDER — NEPRO/CARBSTEADY PO LIQD
237.0000 mL | Freq: Three times a day (TID) | ORAL | Status: DC | PRN
  Filled 2020-04-20: qty 237

## 2020-04-20 MED ORDER — ACETAMINOPHEN 325 MG PO TABS
650.0000 mg | ORAL_TABLET | Freq: Four times a day (QID) | ORAL | Status: DC | PRN
  Administered 2020-04-20: 650 mg via ORAL
  Filled 2020-04-20: qty 2

## 2020-04-20 NOTE — ED Triage Notes (Signed)
Patient arrived via EMS; c/o low HR (38 bpm). Wife at bedside stated she has a home pulse ox that read low HR last night and patient vomited this morning. Patient denies pain, weakness, dizziness and shortness when asked.

## 2020-04-20 NOTE — Telephone Encounter (Signed)
Called, LVM advising patient that I would notify Dr.O'Neal that he was at the hospital- and to call back with any other concerns.

## 2020-04-20 NOTE — ED Notes (Signed)
Requested hospital bed for pt.

## 2020-04-20 NOTE — Telephone Encounter (Signed)
I agree he should be evaluated in the hospital.   [place] T. Audie Box, [place]  [address] [place], [place] [address] [phone]  12:37 PM

## 2020-04-20 NOTE — Consult Note (Signed)
[facility]  INPATIENT CONSULTATION  Reason for Consultation: CKD Requesting Provider: Dr. Gilford Raid  HPI: David Nguyen is an 79 y.o. male with CKD 5, DM, HL, HTN, OSA, h/o bradycardia who is seen for evaluation and management of CKD.   He presented to ED with 1 episode of vomiting and HR of 38 last PM on home pulse ox.  Here HR is low 40s and HTN initially 160/50 now 135/80.  Labs show Na 133, K 5.2, Bicarb 19, BUN 90, Cr 5.25, Hb 10.3, WBC 7.8.  CXR with congestion.  He follows with Dr. Carolin Sicks at [facility] - last visit was 4/29. Had AVF placed last year but underwent revision with Dr. Donnetta Hutching - AA more proximal - on 03/12/20; f/u earlier this month with duplex showing small AVF - 2-59mm with depth 5-55mm.   PMH: Past Medical History:  Diagnosis Date  . Allergic rhinitis   . Anemia    low iron  . Bradycardia 03/2019  . Chronic kidney disease    Followed by Dr Deterding    . Diabetes mellitus    Type II  . Horseshoe kidney   . Hyperlipidemia   . Hypertension   . Hypothyroidism   . Sleep apnea    uses a cpap   PSH: Past Surgical History:  Procedure Laterality Date  . A/V FISTULAGRAM Left 03/05/2020   Procedure: A/V FISTULAGRAM - Left Arm;  Surgeon: Angelia Mould, MD;  Location: [facility];  Service: Cardiovascular;  Laterality: Left;  . ADRENALECTOMY  1993   left  . AV FISTULA PLACEMENT Left 05/06/2018   Procedure: ARTERIOVENOUS (AV) FISTULA CREATION RADIOCEPHALIC;  Surgeon: [name], MD;  Location: [facility];  Service: Vascular;  Laterality: Left;  . COLONOSCOPY W/ POLYPECTOMY    . REVISION OF ARTERIOVENOUS GORETEX GRAFT Left 07/11/2018   Procedure: TRANSPOSITION OF RADIOCEPHALIC  ARTERIOVENOUS FISTULA LEFT ARM;  Surgeon: [name], MD;  Location: [place];  Service: Vascular;  Laterality: Left;  . REVISION OF ARTERIOVENOUS GORETEX GRAFT Left 03/12/2020   Procedure: REVISION OF LEFT RADIOCEPHALIC FISTULA;  Surgeon: [name], MD;  Location: [facility];   Service: Vascular;  Laterality: Left;    Past Medical History:  Diagnosis Date  . Allergic rhinitis   . Anemia    low iron  . Bradycardia 03/2019  . Chronic kidney disease    Followed by Dr Deterding    . Diabetes mellitus    Type II  . Horseshoe kidney   . Hyperlipidemia   . Hypertension   . Hypothyroidism   . Sleep apnea    uses a cpap    Medications:  I have reviewed the patient's current medications.  (Not in a hospital admission)   ALLERGIES:   Allergies  Allergen Reactions  . Penicillins Rash and Other (See Comments)    Childhood allergy       FAM HX: Family History  Problem Relation Age of Onset  . Prostate cancer Father   . Kidney failure Mother   . Lung cancer Sister     Social History:   reports that he quit smoking about 51 years ago. His smoking use included cigarettes. He has a 0.90 pack-year smoking history. He has never used smokeless tobacco. He reports that he does not drink alcohol and does not use drugs.  ROS: 12 system ROS neg except per HPI above  Blood pressure (!) 170/54, pulse (!) 41, temperature 97.6 F (36.4 C), temperature source Oral, resp.  rate (!) 23, height 6' (1.829 m), weight 122.5 kg, SpO2 96 %. PHYSICAL EXAM: Gen: elderly man lying on stretcher in no distress  Eyes:  anicteric ENT: MMM Neck: supple, cant assess JVD with excess tissue CV:  Loletha Grayer, regular, no rub Abd:  Soft, obese Back: clear to bases lungs GU: no foley Extr:  2+ pitting edema Neuro: no asterixis, very hard of hearing, AOx3 but seems slow to respond Skin: cool and dry   Results for orders placed or performed during the hospital encounter of 04/20/20 (from the past 48 hour(s))  Basic metabolic panel     Status: Abnormal   Collection Time: 04/20/20 12:47 PM  Result Value Ref Range   Sodium 133 (L) 135 - 145 mmol/L   Potassium 5.2 (H) 3.5 - 5.1 mmol/L   Chloride 101 98 - 111 mmol/L   CO2 19 (L) 22 - 32 mmol/L   Glucose, Bld 51 (L) 70 - 99 mg/dL     Comment: Glucose reference range applies only to samples taken after fasting for at least 8 hours.   BUN 90 (H) 8 - 23 mg/dL   Creatinine, Ser 5.25 (H) 0.61 - 1.24 mg/dL   Calcium 9.2 8.9 - 10.3 mg/dL   GFR calc non Af Amer 9 (L) >60 mL/min   GFR calc Af Amer 11 (L) >60 mL/min   Anion gap 13 5 - 15    Comment: Performed at [facility] [address], [place], [place] [address]  CBC     Status: Abnormal   Collection Time: 04/20/20 12:47 PM  Result Value Ref Range   WBC 7.8 4.0 - 10.5 K/uL   RBC 3.97 (L) 4.22 - 5.81 MIL/uL   Hemoglobin 10.3 (L) 13.0 - 17.0 g/dL   HCT 33.2 (L) 39 - 52 %   MCV 83.6 80.0 - 100.0 fL   MCH 25.9 (L) 26.0 - 34.0 pg   MCHC 31.0 30.0 - 36.0 g/dL   RDW 19.5 (H) 11.5 - 15.5 %   Platelets 203 150 - 400 K/uL   nRBC 0.0 0.0 - 0.2 %    Comment: Performed at [facility] [address], [place], [place] [address]  Troponin I (High Sensitivity)     Status: Abnormal   Collection Time: 04/20/20 12:47 PM  Result Value Ref Range   Troponin I (High Sensitivity) 21 (H) <18 ng/L    Comment: (NOTE) Elevated high sensitivity troponin I (hsTnI) values and significant  changes across serial measurements may suggest ACS but many other  chronic and acute conditions are known to elevate hsTnI results.  Refer to the "Links" section for chest pain algorithms and additional  guidance. Performed at [facility], [place] [address] [place], [place] [address]   CBG monitoring, ED     Status: Abnormal   Collection Time: 04/20/20  2:16 PM  Result Value Ref Range   Glucose-Capillary 53 (L) 70 - 99 mg/dL    Comment: Glucose reference range applies only to samples taken after fasting for at least 8 hours.  CBG monitoring, ED     Status: None   Collection Time: 04/20/20  3:22 PM  Result Value Ref Range   Glucose-Capillary 98 70 - 99 mg/dL    Comment: Glucose reference range applies only to samples taken after fasting for at least 8 hours.    DG Chest Port 1  View  Result Date: 04/20/2020 CLINICAL DATA:  Episode of bradycardia last night. EXAM: PORTABLE  CHEST 1 VIEW COMPARISON:  Single-view of the chest 03/31/2019. FINDINGS: Cardiomegaly and vascular congestion. No consolidative process, pneumothorax or effusion. Atherosclerosis. No acute bony abnormality. IMPRESSION: Cardiomegaly and pulmonary vascular congestion. Aortic Atherosclerosis (ICD10-I70.0). Electronically Signed   By: Inge Rise M.D.   On: 04/20/2020 12:54    Assessment/Plan **Nausea/vomiting: unclear if this is progressive uremia or other etiology based on my history with him.  Will observe. Will d/w family for collateral history - not currently available.  **Bradycardia:  Cardiology aware, currently normotensive.  Monitor on telemetry.  ? If nausea could be low CO state related.   **CKD 5: BUN 90, cr 5.2 which is a bit worse than usual.  Some symptoms could be uremic symptoms but no urgent need for HD.  Will observe for now.  LAVF is very small so if needs dialysis would need TDC.  Will make him NPOpMN in case it's needed, but won't order the catheter yet.   **HFpEF: 03/2019 TTE with normal EF, grade 3 DD.  Evidence of volume overload on CXR.  Diuresis with lasix 60 IV BID for now.   **Secondary hyperparathyroidism:  Cont home calcitriol dose, check PTH and phos. Low phos diet.   **Anemia of CKD: on retacrit 30k q3wks - last 6/29 held due to Hb 12.7. Hb 10.3 today, monitor.   **DM: per primary  **HTN: resume home meds and diurese.  Justin Mend 04/20/2020, 3:23 PM

## 2020-04-20 NOTE — ED Notes (Signed)
Pt transitioned to hospital bed, pillow provided, pt repositioned, and new warm blankets given.

## 2020-04-20 NOTE — H&P (Signed)
History and Physical    David Nguyen:[id number] DOB: Jul 25, 1941 DOA: 04/20/2020  PCP: [name], MD Consultants:  Nephrology; Early - vascular; Winter - urology Patient coming from:  Home - lives with wife; NOK: Wife, [phone]  Chief Complaint: bradycardia  HPI: David Nguyen is a 79 y.o. male with medical history significant of OSA; hypothyroidism; HTN; HLD; DM; and CKD presenting with bradycardia.  He wasn't feeling good for the last few days.  He thought maybe it was the heat.  He has not had an appetite for the last couple of weeks.  When he goes into the sun, his heart rate goes down and he gets tired.  He is not sure if he has been drinking enough.  He is making urine well.  He has not been able to walk because of his arthritis - he is bent over with leg pain, primarily left leg.  His L calf hurts with walking.  Small smoking history, very remote.  No chest pain.   ED Course: HR in 40s, vomited this AM.  Cardiologist notes this is chronic.  On beta blocker, will hold.  Also with ESRD and getting ready for HD - increased BUN/Creatinine, Dr. Johnney Ou recommends observation to trend BUN/Creatinine.  Review of Systems: As per HPI; otherwise review of systems reviewed and negative.   Ambulatory Status:  Ambulates without assistance  COVID Vaccine Status:   Complete  Past Medical History:  Diagnosis Date  . Allergic rhinitis   . Anemia    low iron  . Bradycardia 03/2019  . Chronic kidney disease    Followed by Dr Deterding    . Diabetes mellitus    Type II  . Horseshoe kidney   . Hyperlipidemia   . Hypertension   . Hypothyroidism   . Refusal of blood transfusions as patient is Jehovah's Witness   . Scrotal edema   . Sleep apnea    uses a cpap    Past Surgical History:  Procedure Laterality Date  . A/V FISTULAGRAM Left 03/05/2020   Procedure: A/V FISTULAGRAM - Left Arm;  Surgeon: Angelia Mould, MD;  Location: [facility];  Service:  Cardiovascular;  Laterality: Left;  . ADRENALECTOMY  1993   left  . AV FISTULA PLACEMENT Left 05/06/2018   Procedure: ARTERIOVENOUS (AV) FISTULA CREATION RADIOCEPHALIC;  Surgeon: [name], MD;  Location: [facility];  Service: Vascular;  Laterality: Left;  . COLONOSCOPY W/ POLYPECTOMY    . REVISION OF ARTERIOVENOUS GORETEX GRAFT Left 07/11/2018   Procedure: TRANSPOSITION OF RADIOCEPHALIC  ARTERIOVENOUS FISTULA LEFT ARM;  Surgeon: [name], MD;  Location: [facility];  Service: Vascular;  Laterality: Left;  . REVISION OF ARTERIOVENOUS GORETEX GRAFT Left 03/12/2020   Procedure: REVISION OF LEFT RADIOCEPHALIC FISTULA;  Surgeon: [name], MD;  [facility];  Service: Vascular;  Laterality: Left;    Social History   Socioeconomic History  . Marital status: Divorced    Spouse name: Not on file  . Number of children: 5  . Years of education: Not on file  . Highest education level: Not on file  Occupational History  . Occupation: retired    Comment: [employer]  Tobacco Use  . Smoking status: Former Smoker    Packs/day: 0.30    Years: 3.00    Pack years: 0.90    Types: Cigarettes    Quit date: 10/09/1968    Years since quitting: 51.5  . Smokeless tobacco: Never Used  Vaping Use  .  Vaping Use: Never used  Substance and Sexual Activity  . Alcohol use: No  . Drug use: No  . Sexual activity: Not on file  Other Topics Concern  . Not on file  Social History Narrative  . Not on file   Social Determinants of Health   Financial Resource Strain:   . Difficulty of Paying Living Expenses:   Food Insecurity:   . Worried About Charity fundraiser in the Last Year:   . Arboriculturist in the Last Year:   Transportation Needs:   . Film/video editor (Medical):   Marland Kitchen Lack of Transportation (Non-Medical):   Physical Activity:   . Days of Exercise per Week:   . Minutes of Exercise per Session:   Stress:   . Feeling of Stress :   Social Connections:   . Frequency of Communication with  Friends and Family:   . Frequency of Social Gatherings with Friends and Family:   . Attends Religious Services:   . Active Member of Clubs or Organizations:   . Attends Archivist Meetings:   Marland Kitchen Marital Status:   Intimate Partner Violence:   . Fear of Current or Ex-Partner:   . Emotionally Abused:   Marland Kitchen Physically Abused:   . Sexually Abused:     Allergies  Allergen Reactions  . Penicillins Rash and Other (See Comments)    Childhood allergy       Family History  Problem Relation Age of Onset  . Prostate cancer Father   . Kidney failure Mother   . Lung cancer Sister     Prior to Admission medications   Medication Sig Start Date End Date Taking? Authorizing Provider  acetaminophen (TYLENOL) 650 MG CR tablet Take 650 mg by mouth in the morning, at noon, and at bedtime.   Yes [provider]  allopurinol (ZYLOPRIM) 100 MG tablet Take 200 mg by mouth daily.  12/07/15  Yes [provider]  amLODipine (NORVASC) 10 MG tablet Take 10 mg by mouth daily in the afternoon.    Yes [provider]  aspirin EC 81 MG tablet Take 81 mg by mouth daily.   Yes [provider]  atorvastatin (LIPITOR) 40 MG tablet Take 40 mg by mouth daily in the afternoon.    Yes [provider]  calcitRIOL (ROCALTROL) 0.25 MCG capsule Take 0.25 mcg by mouth daily.  12/07/15  Yes [provider]  carvedilol (COREG) 3.125 MG tablet Take 1 tablet (3.125 mg total) by mouth 2 (two) times daily with a meal. 04/05/19  Yes Allie Bossier, MD  doxazosin (CARDURA) 8 MG tablet Take 8 mg by mouth daily in the afternoon.    Yes [provider]  fluticasone (FLONASE) 50 MCG/ACT nasal spray Place 2 sprays into both nostrils daily.  03/31/18  Yes [provider]  furosemide (LASIX) 80 MG tablet Take 1 tablet (80 mg total) by mouth daily. Patient taking differently: Take 40-80 mg by mouth See admin instructions. Take 80 mg in the morning and 40 mg in the  early afternoon 04/05/19  Yes Allie Bossier, MD  Homeopathic Products [facility] ALLERGY EYE RELIEF) SOLN Place 1-2 drops into both eyes daily as needed (for dry eyes).   Yes [provider]  insulin NPH-regular Human (70-30) 100 UNIT/ML injection Inject 16-18 Units into the skin See admin instructions. Inject 18 units in the morning and 16 units at night   Yes [provider]  levothyroxine (SYNTHROID, [place])  75 MCG tablet Take 75 mcg by mouth daily before breakfast.   Yes [provider]  lisinopril (ZESTRIL) 40 MG tablet Take 40 mg by mouth daily.   Yes [provider]  loratadine (CLARITIN) 10 MG tablet Take 10 mg by mouth daily.   Yes [provider]  Multiple Vitamin (MULTIVITAMIN WITH MINERALS) TABS tablet Take 1 tablet by mouth daily.   Yes [provider]  ACCU-CHEK GUIDE test strip  12/06/19   [provider]  DROPLET PEN NEEDLES 31G X 8 MM [place]  11/18/19   [provider]  Insulin Isophane & Regular Human (HUMULIN 70/30 MIX) (70-30) 100 UNIT/ML PEN Inject 10 units subcutaneously before breakfast and inject 10 units subcutaneously before supper Patient not taking: Reported on 04/20/2020 04/05/19   Allie Bossier, MD  oxyCODONE-acetaminophen (PERCOCET) 5-325 MG tablet Take 1 tablet by mouth every 6 (six) hours as needed for severe pain. Patient not taking: Reported on 04/20/2020 03/12/20   Gabriel Earing, PA-C  PRESCRIPTION MEDICATION Inhale into the lungs at bedtime. CPAP    [provider]    Physical Exam: Vitals:   04/20/20 1445 04/20/20 1530 04/20/20 1830 04/20/20 1843  BP: (!) 170/54 135/80 (!) 145/45 (!) 145/45  Pulse: (!) 41 (!) 43 (!) 40 (!) 43  Resp: (!) 23 18 17  (!) 24  Temp:      TempSrc:      SpO2: 96% 97% 95% 93%  Weight:      Height:         . General:  Appears calm and comfortable and is NAD . Eyes:  PERRL, EOMI, normal lids, iris . ENT:  grossly normal hearing, lips & tongue,  mmm; appropriate dentition . Neck:  no LAD, masses or thyromegaly . Cardiovascular:  RRR, no m/r/g.  . Respiratory:   CTA bilaterally with no wheezes/rales/rhonchi.  Normal respiratory effort. . Abdomen:  soft, NT, ND, NABS; large pannus with scrotal edema . Skin:  no rash or induration seen on limited exam . Musculoskeletal:  LLE edema compared to R with tortuous superficial vessels but no erythema, calf tenderness . Psychiatric:  grossly normal mood and affect, speech fluent and appropriate, AOx3 . Neurologic:  CN 2-12 grossly intact, moves all extremities in coordinated fashion    Radiological Exams on Admission: DG Chest Port 1 View  Result Date: 04/20/2020 CLINICAL DATA:  Episode of bradycardia last night. EXAM: PORTABLE CHEST 1 VIEW COMPARISON:  Single-view of the chest 03/31/2019. FINDINGS: Cardiomegaly and vascular congestion. No consolidative process, pneumothorax or effusion. Atherosclerosis. No acute bony abnormality. IMPRESSION: Cardiomegaly and pulmonary vascular congestion. Aortic Atherosclerosis (ICD10-I70.0). Electronically Signed   By: Inge Rise M.D.   On: 04/20/2020 12:54    EKG: Independently reviewed.  Sinus bradycardia with rate 45; RBBB;  nonspecific ST changes with no evidence of acute ischemia   Labs on Admission: I have personally reviewed the available labs and imaging studies at the time of the admission.  Pertinent labs:   Na++ 133 K+ 5.2 CO2 19 Glucose 51, 52, 98 BUN 90/Creatinine 5.25/GFR 11 WBC 7.8 Hgb 10.3 HS troponin 21, 21 COVID negative   Assessment/Plan Principal Problem:   Symptomatic bradycardia Active Problems:   Essential hypertension   Sleep apnea   Morbid obesity due to excess calories (HCC)   Hypothyroidism   Chronic diastolic CHF (congestive heart failure) (HCC)   CKD (chronic kidney disease), stage V (HCC)   Diabetes mellitus type 2, controlled, with complications ([place])  Anemia of chronic disease   Scrotal edema     Symptomatic bradycardia -This appears to be a chronic issue -It is not entirely clear if this is the cause of all of his presenting symptoms but may be related -Cardiology (Dr. Audie Box) consulted by ER on admission and he recommends holding beta blocker and continuing to monitor -TSH is normal. -Will admit to progressive care unit for now  Stage V CKD -Possible AKI on CKD  -Still making urine but creatinine increased from prior -May be a component of ATN associated with decreased renal perfusion in the setting of bradycardia -Hold Lisinopril -If not improving, may need to initiate HD -Continue Calcitriol -Nephrology has changed Lasix to 60 mg IV BID  Scrotal edema -Has been seeing urology -Wife reports testicular US was unremarkable so CT is planned -Also with LLE ?lymphedema -will order CT A/P (no contrast due to renal dysfunction) to assess for obstructive process -Consider discussion with Dr. Lovena Neighbours tomorrow  HTN -Continue Norvasc, Cardura -Hold Coreg due to bradycardia -Hold Lisinopril due to renal dysfunction -Will add prn hydralazine  HLD -Continue Lipitor  Chronic diastolic CHF -Ungraded diastolic dysfunction with preserved EF on echo in 03/2019  -Pulmonary vascular congestion noted on CXR today without frank edema -Lasix as per nephrology  Hypothyroidism -Check TSH -Continue Synthroid at current dose for now  DM -Poor control based on A1c of 9.4 on 1/12 -Continue 70/30 -Cover with sensitive-scale SSI  Anemia -Patient is Jehovah's Witness and will not accept blood products -This does not appear to be an issue at this time but is something to keep in mind  Obesity Body mass index is 36.62 kg/m. -Weight loss should be encouraged -Outpatient PCP/bariatric medicine f/u encouraged   Note: This patient has been tested and is negative for the novel coronavirus COVID-19.  DVT prophylaxis: Heparin Code Status:  Full - confirmed with patient/family Family  Communication: Wife was present throughout evaluation. Disposition Plan:  The patient is from: home  Anticipated d/c is to: home without [facility] services  Anticipated d/c date will depend on clinical response to treatment, likely 2-3 days  Patient is currently: acutely ill Consults called: Cardiology (telephone only, it appears at this time); Nephrology Admission status:  Admit - It is my clinical opinion that admission to INPATIENT is reasonable and necessary because of the expectation that this patient will require hospital care that crosses at least 2 midnights to treat this condition based on the medical complexity of the problems presented.  Given the aforementioned information, the predictability of an adverse outcome is felt to be significant.    Karmen Bongo MD [facility]   How to contact the [facility] Attending or Consulting provider [place] or covering provider during after hours [place], for this patient?  1. Check the care team in [facility] and look for a) attending/consulting [facility] provider listed and b) the [facility] team listed 2. Log into [email or web address] and use [place]'s universal password to access. If you do not have the password, please contact the hospital operator. 3. Locate the [facility] provider you are looking for under [facility] and page to a number that you can be directly reached. 4. If you still have difficulty reaching the provider, please page the [facility] (Director on Call) for the Hospitalists listed on amion for assistance.   04/20/2020, 8:13 PM

## 2020-04-20 NOTE — ED Provider Notes (Signed)
[facility] EMERGENCY DEPARTMENT Provider Note   CSN: [id number] Arrival date & time: 04/20/20  1205     History Chief Complaint  Patient presents with  . Bradycardia    David Nguyen is a 79 y.o. male.  Pt presents to the ED today with a low HR.  HR has been in the 40s all night.  Pt did take his meds last night and today.  He did vomit 1 time this am.  He no longer feels nauseous.  He denies any cp.  He has some leg pain that has been there for about year.  It is so bad that he can't walk.  He has seen vascular for an AV fistula, but has not mentioned his leg pain.        Past Medical History:  Diagnosis Date  . Allergic rhinitis   . Anemia    low iron  . Bradycardia 03/2019  . Chronic kidney disease    Followed by Dr Deterding    . Diabetes mellitus    Type II  . Horseshoe kidney   . Hyperlipidemia   . Hypertension   . Hypothyroidism   . Sleep apnea    uses a cpap    Patient Active Problem List   Diagnosis Date Noted  . Primary osteoarthritis of left knee 08/14/2019  . Chronic diastolic CHF (congestive heart failure) ([place]) 04/04/2019  . Pulmonary hypertension ([place]) 04/04/2019  . Sinus tachycardia 04/04/2019  . CKD (chronic kidney disease), stage V ([place]) 04/04/2019  . Diabetes mellitus type 2, controlled, with complications ([place]) 84/13/2440  . Anemia of chronic disease 04/04/2019  . Obesity, diabetes, and hypertension syndrome ([place]) 04/04/2019  . Horseshoe kidney   . Bradycardia 03/31/2019  . Hypothyroidism 03/31/2019  . Hyperkalemia 03/31/2019  . CKD stage 4 due to type 2 diabetes mellitus ([place]) 04/15/2018  . History of anemia due to CKD 04/15/2018  . Morbid obesity due to excess calories ([place]) 07/13/2017  . DIABETES, TYPE 2 05/26/2008  . HYPERLIPIDEMIA 05/26/2008  . Essential hypertension 05/26/2008  . Allergic rhinitis 05/26/2008  . Sleep apnea 05/26/2008    Past Surgical History:  Procedure Laterality Date  . A/V FISTULAGRAM  Left 03/05/2020   Procedure: A/V FISTULAGRAM - Left Arm;  Surgeon: Angelia Mould, MD;  Location: [facility];  Service: Cardiovascular;  Laterality: Left;  . ADRENALECTOMY  1993   left  . AV FISTULA PLACEMENT Left 05/06/2018   Procedure: ARTERIOVENOUS (AV) FISTULA CREATION RADIOCEPHALIC;  Surgeon: [name], MD;  Location: [facility];  Service: Vascular;  Laterality: Left;  . COLONOSCOPY W/ POLYPECTOMY    . REVISION OF ARTERIOVENOUS GORETEX GRAFT Left 07/11/2018   Procedure: TRANSPOSITION OF RADIOCEPHALIC  ARTERIOVENOUS FISTULA LEFT ARM;  Surgeon: [name], MD;  Location: [facility];  Service: Vascular;  Laterality: Left;  . REVISION OF ARTERIOVENOUS GORETEX GRAFT Left 03/12/2020   Procedure: REVISION OF LEFT RADIOCEPHALIC FISTULA;  Surgeon: [name], MD;  Location: [facility];  Service: Vascular;  Laterality: Left;       Family History  Problem Relation Age of Onset  . Prostate cancer Father   . Kidney failure Mother   . Lung cancer Sister     Social History   Tobacco Use  . Smoking status: Former Smoker    Packs/day: 0.30    Years: 3.00    Pack years: 0.90    Types: Cigarettes    Quit date: 10/09/1968    Years since  quitting: 51.5  . Smokeless tobacco: Never Used  Vaping Use  . Vaping Use: Never used  Substance Use Topics  . Alcohol use: No  . Drug use: No    Home Medications Prior to Admission medications   Medication Sig Start Date End Date Taking? Authorizing Provider  acetaminophen (TYLENOL) 650 MG CR tablet Take 650 mg by mouth in the morning, at noon, and at bedtime.   Yes [provider]  allopurinol (ZYLOPRIM) 100 MG tablet Take 200 mg by mouth daily.  12/07/15  Yes [provider]  amLODipine (NORVASC) 10 MG tablet Take 10 mg by mouth daily in the afternoon.    Yes [provider]  aspirin EC 81 MG tablet Take 81 mg by mouth daily.   Yes [provider]  atorvastatin (LIPITOR) 40 MG tablet Take 40 mg by mouth daily  in the afternoon.    Yes [provider]  calcitRIOL (ROCALTROL) 0.25 MCG capsule Take 0.25 mcg by mouth daily.  12/07/15  Yes [provider]  carvedilol (COREG) 3.125 MG tablet Take 1 tablet (3.125 mg total) by mouth 2 (two) times daily with a meal. 04/05/19  Yes Allie Bossier, MD  doxazosin (CARDURA) 8 MG tablet Take 8 mg by mouth daily in the afternoon.    Yes [provider]  fluticasone (FLONASE) 50 MCG/ACT nasal spray Place 2 sprays into both nostrils daily.  03/31/18  Yes [provider]  furosemide (LASIX) 80 MG tablet Take 1 tablet (80 mg total) by mouth daily. Patient taking differently: Take 40-80 mg by mouth See admin instructions. Take 80 mg in the morning and 40 mg in the early afternoon 04/05/19  Yes Allie Bossier, MD  Homeopathic Products [facility] ALLERGY EYE RELIEF) SOLN Place 1-2 drops into both eyes daily as needed (for dry eyes).   Yes [provider]  insulin NPH-regular Human (70-30) 100 UNIT/ML injection Inject 16-18 Units into the skin See admin instructions. Inject 18 units in the morning and 16 units at night   Yes [provider]  levothyroxine (SYNTHROID, LEVOTHROID) 75 MCG tablet Take 75 mcg by mouth daily before breakfast.   Yes [provider]  lisinopril (ZESTRIL) 40 MG tablet Take 40 mg by mouth daily.   Yes [provider]  loratadine (CLARITIN) 10 MG tablet Take 10 mg by mouth daily.   Yes [provider]  Multiple Vitamin (MULTIVITAMIN WITH MINERALS) TABS tablet Take 1 tablet by mouth daily.   Yes [provider]  ACCU-CHEK GUIDE test strip  12/06/19   [provider]  DROPLET PEN NEEDLES 31G X 8 MM [place]  11/18/19   [provider]  Insulin Isophane & Regular Human (HUMULIN 70/30 MIX) (70-30) 100 UNIT/ML PEN Inject 10 units subcutaneously before breakfast and inject 10 units subcutaneously before supper Patient not taking: Reported on 04/20/2020 04/05/19    Allie Bossier, MD  oxyCODONE-acetaminophen (PERCOCET) 5-325 MG tablet Take 1 tablet by mouth every 6 (six) hours as needed for severe pain. Patient not taking: Reported on 04/20/2020 03/12/20   Gabriel Earing, PA-C  PRESCRIPTION MEDICATION Inhale into the lungs at bedtime. CPAP    [provider]    Allergies    Penicillins  Review of Systems   Review of Systems  Cardiovascular:       Low HR  All other systems reviewed and are negative.   Physical Exam Updated Vital Signs BP (!) 170/54   Pulse (!) 41  Temp 97.6 F (36.4 C) (Oral)   Resp (!) 23   Ht 6' (1.829 m)   Wt 122.5 kg   SpO2 96%   BMI 36.62 kg/m   Physical Exam Vitals and nursing note reviewed.  Constitutional:      Appearance: Normal appearance.  HENT:     Head: Normocephalic and atraumatic.     Right Ear: External ear normal.     Left Ear: External ear normal.     Nose: Nose normal.     Mouth/Throat:     Mouth: Mucous membranes are moist.     Pharynx: Oropharynx is clear.  Eyes:     Extraocular Movements: Extraocular movements intact.     Conjunctiva/sclera: Conjunctivae normal.     Pupils: Pupils are equal, round, and reactive to light.  Cardiovascular:     Rate and Rhythm: Regular rhythm. Bradycardia present.     Pulses: Normal pulses.     Heart sounds: Normal heart sounds.  Pulmonary:     Effort: Pulmonary effort is normal.     Breath sounds: Normal breath sounds.  Abdominal:     General: Abdomen is flat. Bowel sounds are normal.     Palpations: Abdomen is soft.  Musculoskeletal:        General: Normal range of motion.     Cervical back: Normal range of motion and neck supple.     Right lower leg: Edema present.     Left lower leg: Edema present.  Skin:    General: Skin is warm.     Capillary Refill: Capillary refill takes less than 2 seconds.  Neurological:     General: No focal deficit present.     Mental Status: He is alert and oriented to person, place, and time.   Psychiatric:        Mood and Affect: Mood normal.        Behavior: Behavior normal.        Thought Content: Thought content normal.        Judgment: Judgment normal.     ED Results / Procedures / Treatments   Labs (all labs ordered are listed, but only abnormal results are displayed) Labs Reviewed  BASIC METABOLIC PANEL - Abnormal; Notable for the following components:      Result Value   Sodium 133 (*)    Potassium 5.2 (*)    CO2 19 (*)    Glucose, Bld 51 (*)    BUN 90 (*)    Creatinine, Ser 5.25 (*)    GFR calc non Af Amer 9 (*)    GFR calc Af Amer 11 (*)    All other components within normal limits  CBC - Abnormal; Notable for the following components:   RBC 3.97 (*)    Hemoglobin 10.3 (*)    HCT 33.2 (*)    MCH 25.9 (*)    RDW 19.5 (*)    All other components within normal limits  CBG MONITORING, ED - Abnormal; Notable for the following components:   Glucose-Capillary 53 (*)    All other components within normal limits  TROPONIN I (HIGH SENSITIVITY) - Abnormal; Notable for the following components:   Troponin I (High Sensitivity) 21 (*)    All other components within normal limits  SARS CORONAVIRUS 2 BY RT PCR [facility] ORDER, [place] LAB)  CBG MONITORING, ED  TROPONIN I (HIGH SENSITIVITY)    EKG EKG Interpretation  Date/Time:  Tuesday April 20 2020 12:16:05 EDT Ventricular  Rate:  45 PR Interval:  112 QRS Duration: 138 QT Interval:  480 QTC Calculation: 415 R Axis:   -39 Text Interpretation: Sinus bradycardia Left axis deviation Right bundle branch block Anteroseptal infarct , age undetermined Abnormal ECG Since last tracing rate slower Confirmed by Isla Pence [phone]) on 04/20/2020 12:35:18 PM   Radiology DG Chest Port 1 View  Result Date: 04/20/2020 CLINICAL DATA:  Episode of bradycardia last night. EXAM: PORTABLE CHEST 1 VIEW COMPARISON:  Single-view of the chest 03/31/2019. FINDINGS: Cardiomegaly and vascular congestion.  No consolidative process, pneumothorax or effusion. Atherosclerosis. No acute bony abnormality. IMPRESSION: Cardiomegaly and pulmonary vascular congestion. Aortic Atherosclerosis (ICD10-I70.0). Electronically Signed   By: Inge Rise M.D.   On: 04/20/2020 12:54    Procedures Procedures (including critical care time)  Medications Ordered in ED Medications - No data to display  ED Course  I have reviewed the triage vital signs and the nursing notes.  Pertinent labs & imaging results that were available during my care of the patient were reviewed by me and considered in my medical decision making (see chart for details).    MDM Rules/Calculators/A&P                          Pt's bp has been stable while here.  HR has remained in the 40s.  Pt d/w Dr. Audie Box (cards) who said pt has been bradycardic for a long time.  He recommends holding the Coreg for now.  They will follow along.  Pt d/w Dr. Johnney Ou (nephrology) who does not feel pt needs emergent dialysis.  She would like pt admitted for observation to trend bun/cr.  Pt d/w Dr. Lorin Mercy for admission.   Final Clinical Impression(s) / ED Diagnoses Final diagnoses:  Bradycardia  ESRD (end stage renal disease) ([place])    Rx / DC Orders ED Discharge Orders    None       Isla Pence, MD 04/20/20 1606

## 2020-04-20 NOTE — Telephone Encounter (Signed)
New Message   STAT if HR is under 50 or over 120 (normal HR is 60-100 beats per minute)  1) What is your heart rate? Pts wife says HR has been in the 40's all night   2) Do you have a log of your heart rate readings (document readings)? Yes   3) Do you have any other symptoms? Pts wife says pt has had low heart rate all night in the 40's. She says patient woke up this morning and vomited. She says she called patients PCP and they advised her to take him to [facility]. Pt is at [facility] now.   Pts wife would like for Dr Debbe Mounts nurse to call her, she is also wondering if Dr Marisue Ivan will be able to see the patient in the ER    Please call

## 2020-04-21 LAB — RENAL FUNCTION PANEL
Albumin: 2.9 g/dL — ABNORMAL LOW (ref 3.5–5.0)
Anion gap: 13 (ref 5–15)
BUN: 92 mg/dL — ABNORMAL HIGH (ref 8–23)
CO2: 18 mmol/L — ABNORMAL LOW (ref 22–32)
Calcium: 9.2 mg/dL (ref 8.9–10.3)
Chloride: 104 mmol/L (ref 98–111)
Creatinine, Ser: 5.15 mg/dL — ABNORMAL HIGH (ref 0.61–1.24)
GFR calc Af Amer: 11 mL/min — ABNORMAL LOW (ref >60)
GFR calc non Af Amer: 10 mL/min — ABNORMAL LOW (ref >60)
Glucose, Bld: 127 mg/dL — ABNORMAL HIGH (ref 70–99)
Phosphorus: 6 mg/dL — ABNORMAL HIGH (ref 2.5–4.6)
Potassium: 5 mmol/L (ref 3.5–5.1)
Sodium: 135 mmol/L (ref 135–145)

## 2020-04-21 LAB — GLUCOSE, CAPILLARY: Glucose-Capillary: 122 mg/dL — ABNORMAL HIGH (ref 70–99)

## 2020-04-21 LAB — CBG MONITORING, ED
Glucose-Capillary: 105 mg/dL — ABNORMAL HIGH (ref 70–99)
Glucose-Capillary: 110 mg/dL — ABNORMAL HIGH (ref 70–99)
Glucose-Capillary: 147 mg/dL — ABNORMAL HIGH (ref 70–99)

## 2020-04-21 LAB — CBC
HCT: 31.8 % — ABNORMAL LOW (ref 39.0–52.0)
Hemoglobin: 9.5 g/dL — ABNORMAL LOW (ref 13.0–17.0)
MCH: 25.1 pg — ABNORMAL LOW (ref 26.0–34.0)
MCHC: 29.9 g/dL — ABNORMAL LOW (ref 30.0–36.0)
MCV: 84.1 fL (ref 80.0–100.0)
Platelets: 196 10*3/uL (ref 150–400)
RBC: 3.78 MIL/uL — ABNORMAL LOW (ref 4.22–5.81)
RDW: 19.4 % — ABNORMAL HIGH (ref 11.5–15.5)
WBC: 7.6 10*3/uL (ref 4.0–10.5)
nRBC: 0 % (ref 0.0–0.2)

## 2020-04-21 NOTE — Plan of Care (Signed)
Plan of care initiated.

## 2020-04-21 NOTE — ED Notes (Signed)
Pt able to void 300 mL. Post void residual 9 mL

## 2020-04-21 NOTE — Progress Notes (Signed)
PROGRESS NOTE    David Nguyen  IWP:[id number] DOB: 13-Mar-1941 DOA: 04/20/2020 PCP: [name], MD  Brief Narrative: HPI per Dr. Richardean Sale is a 78 y.o. male with medical history significant of OSA; hypothyroidism; HTN; HLD; DM; and CKD presenting with bradycardia.  He wasn't feeling good for the last few days.  He thought maybe it was the heat.  He has not had an appetite for the last couple of weeks.  When he goes into the sun, his heart rate goes down and he gets tired.  He is not sure if he has been drinking enough.  He is making urine well.  He has not been able to walk because of his arthritis - he is bent over with leg pain, primarily left leg.  His L calf hurts with walking.  Small smoking history, very remote.  No chest pain.   ED Course: HR in 40s, vomited this AM.  Cardiologist notes this is chronic.  On beta blocker, will hold.  Also with ESRD and getting ready for HD - increased BUN/Creatinine, Dr. Johnney Ou recommends observation to trend BUN/Creatinine.   Assessment & Plan:   Principal Problem:   Symptomatic bradycardia Active Problems:   Essential hypertension   Sleep apnea   Morbid obesity due to excess calories (HCC)   Hypothyroidism   Chronic diastolic CHF (congestive heart failure) (HCC)   CKD (chronic kidney disease), stage V (HCC)   Diabetes mellitus type 2, controlled, with complications ([place])   Anemia of chronic disease   Scrotal edema   #1 bradycardia unclear if his symptoms are caused by uremia or bradycardia.  ED physician discussed with Dr. Davina Poke and advised to stop the beta-blocker.  Heart rate still in the 40s.  Continue to monitor on telemetry off of beta-blocker.  #2 stage V CKD followed by nephrology-nephrology attempting  to diurese him with increased dose of Lasix if no improvement will initiate hemodialysis.  #3 history of essential hypertension on Norvasc [place].  Prior to admission he was also on Coreg and lisinopril which is  on hold.  Blood pressure 140/61  #4 history of hyperlipidemia on statin  #5 history of hypothyroidism continue Synthroid TSH 4.9  #6 type 2 diabetes poorly controlled hemoglobin A1c was 9.4 CBG (last 3)  Recent Labs    04/20/20 2212 04/21/20 0734 04/21/20 1206  GLUCAP 106* 105* 110*     #7 history of chronic diastolic heart failure last echo with ejection fraction normal 03/29/2019.  On admission his chest x-ray showed pulmonary vascular congestion.  Continue Lasix.  I's and O's.  #8 anemia chronic patient is Jehov[employer]'s Witness will not take blood products.  #9 scrotal edema followed by urology as an outpatient.  CT scrotum  CT - Moderate to large right indirect inguinal hernia with herniation of mesenteric fat into the scrotal sac, likely accounting for the patient's apparent scrotal swelling.  Additional small fat containing epigastric ventral and umbilical hernias.  Will need to follow-up with surgery as an outpatient.  Estimated body mass index is 36.62 kg/m as calculated from the following:   Height as of this encounter: 6' (1.829 m).   Weight as of this encounter: 122.5 kg.  DVT prophylaxis: Heparin Code Status: Full code Family Communication: None at bedside Disposition Plan:  Status is: Inpatient   Dispo: The patient is from: Home              Anticipated d/c is to: Home  Anticipated d/c date is: > 3 days              Patient currently is not medically stable to d/c.    Consultants: Nephrology ED physician discussed with cardiology but not formally consulted.  Procedures: None Antimicrobials: None  Subjective: Resting in bed he has no complaints no chest pain no shortness of breath no headaches nausea vomiting.  Objective: Vitals:   04/21/20 0534 04/21/20 0735 04/21/20 0919 04/21/20 1050  BP: (!) 154/45 136/66 (!) 139/53 (!) 160/45  Pulse: (!) 48 (!) 43 (!) 52 (!) 44  Resp: 17 16 (!) 21 14  Temp:  98 F (36.7 C)    TempSrc:  Oral     SpO2: 95% 94% 92% 95%  Weight:      Height:       No intake or output data in the 24 hours ending 04/21/20 1250 Filed Weights   04/20/20 1233  Weight: 122.5 kg    Examination:  General exam: Appears calm and comfortable  Respiratory system: Clear to auscultation. Respiratory effort normal. Cardiovascular system: S1 & S2 heard, bradycardic. No JVD, murmurs, rubs, gallops or clicks. No pedal edema. Gastrointestinal system: Abdomen is nondistended, soft and nontender. No organomegaly or masses felt. Normal bowel sounds heard. Central nervous system: Alert and oriented. No focal neurological deficits. Extremities: 1+ pitting edema Skin: No rashes, lesions or ulcers Psychiatry: Judgement and insight appear normal. Mood & affect appropriate.     Data Reviewed: I have personally reviewed following labs and imaging studies  CBC: Recent Labs  Lab 04/20/20 1247 04/21/20 0331  WBC 7.8 7.6  HGB 10.3* 9.5*  HCT 33.2* 31.8*  MCV 83.6 84.1  PLT 203 476   Basic Metabolic Panel: Recent Labs  Lab 04/20/20 1247 04/21/20 0331  NA 133* 135  K 5.2* 5.0  CL 101 104  CO2 19* 18*  GLUCOSE 51* 127*  BUN 90* 92*  CREATININE 5.25* 5.15*  CALCIUM 9.2 9.2  PHOS  --  6.0*   GFR: Estimated Creatinine Clearance: 15.2 mL/min (A) (by C-G formula based on SCr of 5.15 mg/dL (H)). Liver Function Tests: Recent Labs  Lab 04/21/20 0331  ALBUMIN 2.9*   No results for input(s): LIPASE, AMYLASE in the last 168 hours. No results for input(s): AMMONIA in the last 168 hours. Coagulation Profile: No results for input(s): INR, PROTIME in the last 168 hours. Cardiac Enzymes: No results for input(s): CKTOTAL, CKMB, CKMBINDEX, TROPONINI in the last 168 hours. BNP (last 3 results) No results for input(s): PROBNP in the last 8760 hours. HbA1C: Recent Labs    04/20/20 1247  HGBA1C 6.5*   CBG: Recent Labs  Lab 04/20/20 1522 04/20/20 2156 04/20/20 2212 04/21/20 0734 04/21/20 1206  GLUCAP 98  112* 106* 105* 110*   Lipid Profile: No results for input(s): CHOL, HDL, LDLCALC, TRIG, CHOLHDL, LDLDIRECT in the last 72 hours. Thyroid Function Tests: Recent Labs    04/20/20 2204  TSH 4.985*   Anemia Panel: Recent Labs    04/20/20 2204  FERRITIN 321  TIBC 251  IRON 68   Sepsis Labs: No results for input(s): PROCALCITON, LATICACIDVEN in the last 168 hours.  Recent Results (from the past 240 hour(s))  SARS Coronavirus 2 by RT PCR (hospital order, performed in [facility] hospital lab) Nasopharyngeal Nasopharyngeal Swab     Status: None   Collection Time: 04/20/20  4:46 PM   Specimen: Nasopharyngeal Swab  Result Value Ref Range Status   SARS Coronavirus 2 NEGATIVE  NEGATIVE Final    Comment: (NOTE) SARS-CoV-2 target nucleic acids are NOT DETECTED.  The SARS-CoV-2 RNA is generally detectable in upper and lower respiratory specimens during the acute phase of infection. The lowest concentration of SARS-CoV-2 viral copies this assay can detect is 250 copies / mL. A negative result does not preclude SARS-CoV-2 infection and should not be used as the sole basis for treatment or other patient management decisions.  A negative result may occur with improper specimen collection / handling, submission of specimen other than nasopharyngeal swab, presence of viral mutation(s) within the areas targeted by this assay, and inadequate number of viral copies (<250 copies / mL). A negative result must be combined with clinical observations, patient history, and epidemiological information.  Fact Sheet for Patients:   StrictlyIdeas.no  Fact Sheet for Healthcare Providers: BankingDealers.co.za  This test is not yet approved or  cleared by the [place] FDA and has been authorized for detection and/or diagnosis of SARS-CoV-2 by FDA under an Emergency Use Authorization (EUA).  This EUA will remain in effect (meaning this test can be used)  for the duration of the COVID-19 declaration under Section 564(b)(1) of the Act, 21 U.S.C. section 360bbb-3(b)(1), unless the authorization is terminated or revoked sooner.  Performed at [facility], [place] [address], [place], [place] [address]          Radiology Studies: CT ABDOMEN PELVIS WO CONTRAST  Result Date: 04/20/2020 CLINICAL DATA:  Scrotal swelling/edema EXAM: CT ABDOMEN AND PELVIS WITHOUT CONTRAST TECHNIQUE: Multidetector CT imaging of the abdomen and pelvis was performed following the standard protocol without IV contrast. COMPARISON:  None. FINDINGS: Lower chest: There is engorgement of the dependent pulmonary vasculature and interstitial thickening most in keeping with pulmonary vascular congestion and trace pulmonary edema, possibly cardiogenic in nature. There is extensive coronary artery calcification. Global cardiac size within normal limits. Small pericardial effusion is present small hiatal hernia noted. Hepatobiliary: Liver and gallbladder are unremarkable. Pancreas: Unremarkable Spleen: Unremarkable Adrenals/Urinary Tract: Left adrenalectomy has been performed. A right adrenal gland is unremarkable. Horseshoe kidney noted no hydronephrosis. No intrarenal or ureteral calculi. Accessory renal arteries arise from the a distal abdominal aorta as well as the right common iliac artery. The bladder is mildly distended, but is otherwise unremarkable. Stomach/Bowel: Stomach, small bowel, and large bowel are unremarkable. Appendix normal. No free intraperitoneal gas or fluid. Vascular/Lymphatic: No pathologic adenopathy within the abdomen and pelvis. Extensive aortoiliac atherosclerotic calcification is present without evidence of aneurysm. Particularly prominent calcification is seen within the proximal and mid superior mesenteric artery. Reproductive: Unremarkable. Other: A small fat containing ventral hernia is seen within the epigastrium. A small fat containing umbilical hernia  is present. A a moderate to large right indirect inguinal hernia is present with fat extending into the scrotal sac. Musculoskeletal: Osseous structures are age-appropriate. IMPRESSION: Moderate to large right indirect inguinal hernia with herniation of mesenteric fat into the scrotal sac, likely accounting for the patient's apparent scrotal swelling. Additional small fat containing epigastric ventral and umbilical hernias. Trace pulmonary edema likely related to mild cardiogenic failure. Aortic Atherosclerosis (ICD10-I70.0). Electronically Signed   By: Fidela Salisbury MD   On: 04/20/2020 21:20   DG Chest Port 1 View  Result Date: 04/20/2020 CLINICAL DATA:  Episode of bradycardia last night. EXAM: PORTABLE CHEST 1 VIEW COMPARISON:  Single-view of the chest 03/31/2019. FINDINGS: Cardiomegaly and vascular congestion. No consolidative process, pneumothorax or effusion. Atherosclerosis. No acute bony abnormality. IMPRESSION: Cardiomegaly and pulmonary vascular congestion. Aortic  Atherosclerosis (ICD10-I70.0). Electronically Signed   By: Inge Rise M.D.   On: 04/20/2020 12:54        Scheduled Meds: . allopurinol  200 mg Oral Daily  . amLODipine  10 mg Oral Q1500  . aspirin EC  81 mg Oral Daily  . atorvastatin  40 mg Oral Q1500  . calcitRIOL  0.25 mcg Oral Daily  . doxazosin  8 mg Oral Q1500  . fluticasone  2 spray Each Nare Daily  . furosemide  60 mg Intravenous BID  . heparin  5,000 Units Subcutaneous Q8H  . insulin aspart  0-9 Units Subcutaneous TID WC  . insulin aspart protamine- aspart  16 Units Subcutaneous BID WC  . levothyroxine  75 mcg Oral QAC breakfast  . loratadine  10 mg Oral Daily  . sodium chloride flush  3 mL Intravenous Q12H   Continuous Infusions:   LOS: 1 day     Georgette Shell, MD 04/21/2020, 12:50 PM

## 2020-04-21 NOTE — Progress Notes (Addendum)
[facility] Progress Note   Subjective:   No complaints - says feeling better.  Difficult historian on my exam.  Denies nausea, dysgeusia, pruritis, hiccups, confusion.  Wife not at bedside and not available by phone - called her phone number and their home number.   Objective Vitals:   04/21/20 0328 04/21/20 0400 04/21/20 0534 04/21/20 0735  BP: (!) 142/44 132/60 (!) 154/45 136/66  Pulse: (!) 48 (!) 45 (!) 48 (!) 43  Resp: (!) 21 (!) 23 17 16   Temp:    98 F (36.7 C)  TempSrc:    Oral  SpO2: 93% 92% 95% 94%  Weight:      Height:       Physical Exam General: lying comfortably in bed Heart: brady and regular Lungs: normal WOB on RA, clear ant and laterally Abdomen: soft Extremities: 1+ edema - improved c/w yesterday Dialysis Access:  LFA AVF +t/b, very small  Additional Objective Labs: Basic Metabolic Panel: Recent Labs  Lab 04/20/20 1247 04/21/20 0331  NA 133* 135  K 5.2* 5.0  CL 101 104  CO2 19* 18*  GLUCOSE 51* 127*  BUN 90* 92*  CREATININE 5.25* 5.15*  CALCIUM 9.2 9.2  PHOS  --  6.0*   Liver Function Tests: Recent Labs  Lab 04/21/20 0331  ALBUMIN 2.9*   No results for input(s): LIPASE, AMYLASE in the last 168 hours. CBC: Recent Labs  Lab 04/20/20 1247 04/21/20 0331  WBC 7.8 7.6  HGB 10.3* 9.5*  HCT 33.2* 31.8*  MCV 83.6 84.1  PLT 203 196   Blood Culture No results found for: SDES, SPECREQUEST, CULT, REPTSTATUS  Cardiac Enzymes: No results for input(s): CKTOTAL, CKMB, CKMBINDEX, TROPONINI in the last 168 hours. CBG: Recent Labs  Lab 04/20/20 1416 04/20/20 1522 04/20/20 2156 04/20/20 2212 04/21/20 0734  GLUCAP 53* 98 112* 106* 105*   Iron Studies:  Recent Labs    04/20/20 2204  IRON 68  TIBC 251  FERRITIN 321   @lablastinr3 @ Studies/Results: CT ABDOMEN PELVIS WO CONTRAST  Result Date: 04/20/2020 CLINICAL DATA:  Scrotal swelling/edema EXAM: CT ABDOMEN AND PELVIS WITHOUT CONTRAST TECHNIQUE: Multidetector CT  imaging of the abdomen and pelvis was performed following the standard protocol without IV contrast. COMPARISON:  None. FINDINGS: Lower chest: There is engorgement of the dependent pulmonary vasculature and interstitial thickening most in keeping with pulmonary vascular congestion and trace pulmonary edema, possibly cardiogenic in nature. There is extensive coronary artery calcification. Global cardiac size within normal limits. Small pericardial effusion is present small hiatal hernia noted. Hepatobiliary: Liver and gallbladder are unremarkable. Pancreas: Unremarkable Spleen: Unremarkable Adrenals/Urinary Tract: Left adrenalectomy has been performed. A right adrenal gland is unremarkable. Horseshoe kidney noted no hydronephrosis. No intrarenal or ureteral calculi. Accessory renal arteries arise from the a distal abdominal aorta as well as the right common iliac artery. The bladder is mildly distended, but is otherwise unremarkable. Stomach/Bowel: Stomach, small bowel, and large bowel are unremarkable. Appendix normal. No free intraperitoneal gas or fluid. Vascular/Lymphatic: No pathologic adenopathy within the abdomen and pelvis. Extensive aortoiliac atherosclerotic calcification is present without evidence of aneurysm. Particularly prominent calcification is seen within the proximal and mid superior mesenteric artery. Reproductive: Unremarkable. Other: A small fat containing ventral hernia is seen within the epigastrium. A small fat containing umbilical hernia is present. A a moderate to large right indirect inguinal hernia is present with fat extending into the scrotal sac. Musculoskeletal: Osseous structures are age-appropriate. IMPRESSION: Moderate to large right indirect inguinal hernia with  herniation of mesenteric fat into the scrotal sac, likely accounting for the patient's apparent scrotal swelling. Additional small fat containing epigastric ventral and umbilical hernias. Trace pulmonary edema likely  related to mild cardiogenic failure. Aortic Atherosclerosis (ICD10-I70.0). Electronically Signed   By: Fidela Salisbury MD   On: 04/20/2020 21:20   DG Chest Port 1 View  Result Date: 04/20/2020 CLINICAL DATA:  Episode of bradycardia last night. EXAM: PORTABLE CHEST 1 VIEW COMPARISON:  Single-view of the chest 03/31/2019. FINDINGS: Cardiomegaly and vascular congestion. No consolidative process, pneumothorax or effusion. Atherosclerosis. No acute bony abnormality. IMPRESSION: Cardiomegaly and pulmonary vascular congestion. Aortic Atherosclerosis (ICD10-I70.0). Electronically Signed   By: Inge Rise M.D.   On: 04/20/2020 12:54   Medications:  . allopurinol  200 mg Oral Daily  . amLODipine  10 mg Oral Q1500  . aspirin EC  81 mg Oral Daily  . atorvastatin  40 mg Oral Q1500  . calcitRIOL  0.25 mcg Oral Daily  . doxazosin  8 mg Oral Q1500  . fluticasone  2 spray Each Nare Daily  . furosemide  60 mg Intravenous BID  . heparin  5,000 Units Subcutaneous Q8H  . insulin aspart  0-9 Units Subcutaneous TID WC  . insulin aspart protamine- aspart  16 Units Subcutaneous BID WC  . levothyroxine  75 mcg Oral QAC breakfast  . loratadine  10 mg Oral Daily  . sodium chloride flush  3 mL Intravenous Q12H    Assessment/Plan **Nausea/vomiting:  x 1 prior to presentation - denies any further episode or issue with this or dysgeusia or nausea in weeks prior.    **Bradycardia:  Cardiology aware, currently normotensive.  Monitor on telemetry.  BB on hold.   **CKD 5: BUN 90, cr 5.2 which is a bit worse than usual.  Some symptoms could be uremic symptoms but no urgent need for HD.  Will observe for now.  LAVF is very small so if needs dialysis would need TDC.  If we see worsening while he's being diuresed I'd start dialysis so I've placed him NPOpMN again.  Really would like collateral history from family - not currently available at multiple phone numbers.    **HFpEF: 03/2019 TTE with normal EF, grade 3 DD.   Evidence of volume overload on CXR. Cont duresis with lasix 60 IV BID for now.  No I/Os or weights captured with him still in the ED but his edema is improving.    **Secondary hyperparathyroidism:  Cont home calcitriol dose, check PTH and phos. Low phos diet.   **Anemia of CKD: on retacrit 30k q3wks - last 6/29 held due to Hb 12.7. Hb 10.3 on admission, 9.5 today, monitor.   **DM: per primary  **HTN: BPs ok.   Jannifer Hick MD 04/21/2020, 8:05 AM  [facility] Pager: [phone]  ADDENDUM:  CT scan done last night due to scrotal swelling shows indirect hernia with mesenteric fat present.  Bladder distended.  He is scheduled to see urology already.  Given renal function worse we checked PVR to ensure retention not to blame -- PVR 59mL.   I was able to revisit patient's bedside when wife present.  He says he tried to eat the food here for lunch but it was so terrible he vomited.  We talked that in the last few weeks he hasn't been feeling great - fairly vague symptoms but fatigue, some loss of appetite and the vomiting he's had a few times now.  Reviewed labs with him. Taken together  I think he's getting uremic and should start dialysis.  I call his primary nephrologist Dr. Carolin Sicks at his request and discussed the case --> Dr. Carolin Sicks agreed he should start dialysis.  Rediscussed with Mr. Diltz and he insists to see Dr. Carolin Sicks in clinic again.  I expressed concerns several times re: this turning into an urgent start, him losing strength and mobility due to poor appetite from uremia and likely outcome will be to just return to the hospital to start dialysis at the time he's seen in clinic, but he declined to start dialysis at this time.

## 2020-04-21 NOTE — Progress Notes (Signed)
Patient refused CPAP tonight. Informed patient to let us know if he changed his mind.

## 2020-04-22 LAB — GLUCOSE, CAPILLARY
Glucose-Capillary: 145 mg/dL — ABNORMAL HIGH (ref 70–99)
Glucose-Capillary: 83 mg/dL (ref 70–99)

## 2020-04-22 LAB — RENAL FUNCTION PANEL
Albumin: 3.1 g/dL — ABNORMAL LOW (ref 3.5–5.0)
Anion gap: 13 (ref 5–15)
BUN: 90 mg/dL — ABNORMAL HIGH (ref 8–23)
CO2: 20 mmol/L — ABNORMAL LOW (ref 22–32)
Calcium: 9.4 mg/dL (ref 8.9–10.3)
Chloride: 105 mmol/L (ref 98–111)
Creatinine, Ser: 5.09 mg/dL — ABNORMAL HIGH (ref 0.61–1.24)
GFR calc Af Amer: 11 mL/min — ABNORMAL LOW (ref >60)
GFR calc non Af Amer: 10 mL/min — ABNORMAL LOW (ref >60)
Glucose, Bld: 160 mg/dL — ABNORMAL HIGH (ref 70–99)
Phosphorus: 5.2 mg/dL — ABNORMAL HIGH (ref 2.5–4.6)
Potassium: 4.8 mmol/L (ref 3.5–5.1)
Sodium: 138 mmol/L (ref 135–145)

## 2020-04-22 LAB — CBC
HCT: 36.2 % — ABNORMAL LOW (ref 39.0–52.0)
Hemoglobin: 11 g/dL — ABNORMAL LOW (ref 13.0–17.0)
MCH: 25.3 pg — ABNORMAL LOW (ref 26.0–34.0)
MCHC: 30.4 g/dL (ref 30.0–36.0)
MCV: 83.4 fL (ref 80.0–100.0)
Platelets: 212 10*3/uL (ref 150–400)
RBC: 4.34 MIL/uL (ref 4.22–5.81)
RDW: 19.3 % — ABNORMAL HIGH (ref 11.5–15.5)
WBC: 10.4 10*3/uL (ref 4.0–10.5)
nRBC: 0 % (ref 0.0–0.2)

## 2020-04-22 LAB — PTH, INTACT AND CALCIUM
Calcium, Total (PTH): 9.1 mg/dL (ref 8.6–10.2)
PTH: 60 pg/mL (ref 15–65)

## 2020-04-22 MED ORDER — MENTHOL 3 MG MT LOZG
1.0000 | LOZENGE | OROMUCOSAL | Status: DC | PRN
  Filled 2020-04-22: qty 9

## 2020-04-22 MED ORDER — FUROSEMIDE 80 MG PO TABS
80.0000 mg | ORAL_TABLET | Freq: Two times a day (BID) | ORAL | 11 refills | Status: DC
Start: 2020-04-22 — End: 2022-04-20

## 2020-04-22 MED ORDER — CEPASTAT 14.5 MG MT LOZG: 1.0000 | LOZENGE | OROMUCOSAL | Status: DC | PRN

## 2020-04-22 MED ORDER — AMLODIPINE BESYLATE 2.5 MG PO TABS
2.5000 mg | ORAL_TABLET | Freq: Every day | ORAL | 11 refills | Status: DC
Start: 2020-04-22 — End: 2020-07-02

## 2020-04-22 NOTE — Plan of Care (Signed)

## 2020-04-22 NOTE — TOC Transition Note (Signed)
Transition of Care [facility]) - CM/SW Discharge Note   Patient Details  Name: David Nguyen MRN: [id number] Date of Birth: 06/30/1941  [facility]) CM/SW Contact:  Zenon Mayo, RN Phone Number: 04/22/2020, 12:45 PM   Clinical Narrative:    Patient for dc today, he is set up with [facility] for [place] will deliver walker to room prior to dc.   Final next level of care: [place] Barriers to Discharge: No Barriers Identified   Patient Goals and CMS Choice Patient states their goals for this hospitalization and ongoing recovery are:: get better CMS Medicare.gov Compare Post Acute Care list provided to:: Patient Choice offered to / list presented to : Patient  Discharge Placement                       Discharge Plan and Services   Discharge Planning Services: CM Consult Post Acute Care Choice: Home Health          DME Arranged: Walker rolling DME Agency: [employer] Date DME Agency Contacted: 04/22/20 Time DME Agency Contacted: [id number] Representative spoke with at DME Agency: [place]: PT [place]: [place] Date [place]: 04/22/20 Time [place]: 1242 Representative spoke with at [place]: Tommi Rumps  Social Determinants of Health ([place]) Interventions Transportation Interventions: Intervention Not Indicated   Readmission Risk Interventions Readmission Risk Prevention Plan 04/22/2020  Transportation Screening Complete  PCP or Specialist Appt within 3-5 Days Complete  HRI or [place] Complete  Social Work Consult for [place] Planning/Counseling Complete  Palliative Care Screening Not Applicable  Medication Review Press photographer) Complete  Some recent data might be hidden

## 2020-04-22 NOTE — Discharge Summary (Addendum)
Physician Discharge Summary  David Nguyen PQZ:[id number] DOB: 04/14/1941 DOA: 04/20/2020  PCP: [name], MD  Admit date: 04/20/2020 Discharge date: 04/22/2020  Admitted From: Home Disposition: Home Recommendations for Outpatient Follow-up:  1. Follow up with PCP in 1-2 weeks 2. Please obtain BMP/CBC in one week 3. Please follow up with nephrologist   Home Health physical therapy Equipment/Devices none Discharge Condition: Stable CODE STATUS full code Diet recommendation: Cardiac Brief/Interim Summary:David L Johnsonis a 79 y.o.malewith medical history significant ofOSA; hypothyroidism; HTN; HLD; DM; and CKD presenting with bradycardia.He wasn't feeling good for the last few days. He thought maybe it was the heat. He has not had an appetite for the last couple of weeks. When he goes into the sun, his heart rate goes down and he gets tired. He is not sure if he has been drinking enough. He is making urine well. He has not been able to walk because of his arthritis - he is bent over with leg pain, primarily left leg. His L calf hurts with walking. Small smoking history, very remote. No chest pain.   ED Course:HR in 68s, vomited this AM. Cardiologist notes this is chronic. On beta blocker, will hold. Also with ESRD and getting ready for HD - increased BUN/Creatinine, Dr. Johnney Ou recommends observation to trend BUN/Creatinine.    Discharge Diagnoses:  Principal Problem:   Symptomatic bradycardia Active Problems:   Essential hypertension   Sleep apnea   Morbid obesity due to excess calories (HCC)   Hypothyroidism   Chronic diastolic CHF (congestive heart failure) (HCC)   CKD (chronic kidney disease), stage V (HCC)   Diabetes mellitus type 2, controlled, with complications ([place])   Anemia of chronic disease   Scrotal edema   #1 bradycardia unclear if his symptoms are caused by uremia or bradycardia.  ED physician discussed with Dr. Davina Poke and advised to  stop the beta-blocker.  Heart rate has improved off of beta-blocker.  Will restart Coreg 3.125 mg twice a day as prior to admission.  This was discussed with Dr. Davina Poke prior to discharge.  Follow-up with cardiology.  #2 stage V CKD followed by nephrology-patient does not want to be initiated on dialysis at this time.  He wants to follow-up with his primary nephrologist prior to deciding whether to go on dialysis or not.  We will discharge him on Lasix 80 mg twice a day.   #3 history of essential hypertension-we will discharge him on Norvasc, Lasix, Cardura.  Send message to Dr. Marisue Ivan  to see if we need to restart beta-blocker lower dose.    #4 history of hyperlipidemia on statin  #5 history of hypothyroidism continue Synthroid TSH 4.9  #6 type 2 diabetes poorly controlled hemoglobin A1c was 9.4.  Continue home meds.  #7 history of chronic diastolic heart failure last echo with ejection fraction normal 03/29/2019.  On admission his chest x-ray showed pulmonary vascular congestion.  Continue Lasix.   #8 anemia chronic patient is Jehovah's Witness will not take blood products.  #9 scrotal edema followed by urology as an outpatient.  CT scrotum  CT - Moderate to large right indirect inguinal hernia with herniation of mesenteric fat into the scrotal sac, likely accounting for the patient's apparent scrotal swelling.  Additional small fat containing epigastric ventral and umbilical hernias. Consider follow-up with surgery .Marland Kitchen  Estimated body mass index is 35.22 kg/m as calculated from the following:   Height as of this encounter: 6' (1.829 m).   Weight as of this encounter:  117.8 kg.  Discharge Instructions  Discharge Instructions    Call MD for:  difficulty breathing, headache or visual disturbances   Complete by: As directed    Call MD for:  extreme fatigue   Complete by: As directed    Call MD for:  persistant dizziness or light-headedness   Complete by: As directed    Call  MD for:  persistant nausea and vomiting   Complete by: As directed    Diet - low sodium heart healthy   Complete by: As directed    Increase activity slowly   Complete by: As directed      Allergies as of 04/22/2020      Reactions   Penicillins Rash, Other (See Comments)   Childhood allergy       Medication List    STOP taking these medications   lisinopril 40 MG tablet Commonly known as: ZESTRIL     TAKE these medications   Accu-Chek Guide test strip Generic drug: glucose blood   acetaminophen 650 MG CR tablet Commonly known as: TYLENOL Take 650 mg by mouth in the morning, at noon, and at bedtime.   allopurinol 100 MG tablet Commonly known as: ZYLOPRIM Take 200 mg by mouth daily.   amLODipine 2.5 MG tablet Commonly known as: NORVASC Take 1 tablet (2.5 mg total) by mouth daily. What changed:   medication strength  how much to take  when to take this   aspirin EC 81 MG tablet Take 81 mg by mouth daily.   atorvastatin 40 MG tablet Commonly known as: LIPITOR Take 40 mg by mouth daily in the afternoon.   calcitRIOL 0.25 MCG capsule Commonly known as: ROCALTROL Take 0.25 mcg by mouth daily.   carvedilol 3.125 MG tablet Commonly known as: COREG Take 1 tablet (3.125 mg total) by mouth 2 (two) times daily with a meal.   doxazosin 8 MG tablet Commonly known as: CARDURA Take 8 mg by mouth daily in the afternoon.   Droplet Pen Needles 31G X 8 MM Misc Generic drug: Insulin Pen Needle   fluticasone 50 MCG/ACT nasal spray Commonly known as: FLONASE Place 2 sprays into both nostrils daily.   furosemide 80 MG tablet Commonly known as: Lasix Take 1 tablet (80 mg total) by mouth 2 (two) times daily. What changed: when to take this   insulin NPH-regular Human (70-30) 100 UNIT/ML injection Inject 16-18 Units into the skin See admin instructions. Inject 18 units in the morning and 16 units at night   levothyroxine 75 MCG tablet Commonly known as:  SYNTHROID Take 75 mcg by mouth daily before breakfast.   loratadine 10 MG tablet Commonly known as: CLARITIN Take 10 mg by mouth daily.   multivitamin with minerals Tabs tablet Take 1 tablet by mouth daily.   PRESCRIPTION MEDICATION Inhale into the lungs at bedtime. CPAP   Similasan Allergy Eye Relief Soln Place 1-2 drops into both eyes daily as needed (for dry eyes).       Follow-up Information    Avva, Ravisankar, MD Follow up.   Specialty: Internal Medicine Contact information: [place] [address] [phone]        Rosita Fire, MD Follow up.   Specialties: Nephrology, Internal Medicine Contact information: [address] [place] [address] [phone]              Allergies  Allergen Reactions  . Penicillins Rash and Other (See Comments)    Childhood allergy  Consultations:  Nephrology   Procedures/Studies: CT ABDOMEN PELVIS WO CONTRAST  Result Date: 04/20/2020 CLINICAL DATA:  Scrotal swelling/edema EXAM: CT ABDOMEN AND PELVIS WITHOUT CONTRAST TECHNIQUE: Multidetector CT imaging of the abdomen and pelvis was performed following the standard protocol without IV contrast. COMPARISON:  None. FINDINGS: Lower chest: There is engorgement of the dependent pulmonary vasculature and interstitial thickening most in keeping with pulmonary vascular congestion and trace pulmonary edema, possibly cardiogenic in nature. There is extensive coronary artery calcification. Global cardiac size within normal limits. Small pericardial effusion is present small hiatal hernia noted. Hepatobiliary: Liver and gallbladder are unremarkable. Pancreas: Unremarkable Spleen: Unremarkable Adrenals/Urinary Tract: Left adrenalectomy has been performed. A right adrenal gland is unremarkable. Horseshoe kidney noted no hydronephrosis. No intrarenal or ureteral calculi. Accessory renal arteries arise from the a distal abdominal aorta as well as the right common  iliac artery. The bladder is mildly distended, but is otherwise unremarkable. Stomach/Bowel: Stomach, small bowel, and large bowel are unremarkable. Appendix normal. No free intraperitoneal gas or fluid. Vascular/Lymphatic: No pathologic adenopathy within the abdomen and pelvis. Extensive aortoiliac atherosclerotic calcification is present without evidence of aneurysm. Particularly prominent calcification is seen within the proximal and mid superior mesenteric artery. Reproductive: Unremarkable. Other: A small fat containing ventral hernia is seen within the epigastrium. A small fat containing umbilical hernia is present. A a moderate to large right indirect inguinal hernia is present with fat extending into the scrotal sac. Musculoskeletal: Osseous structures are age-appropriate. IMPRESSION: Moderate to large right indirect inguinal hernia with herniation of mesenteric fat into the scrotal sac, likely accounting for the patient's apparent scrotal swelling. Additional small fat containing epigastric ventral and umbilical hernias. Trace pulmonary edema likely related to mild cardiogenic failure. Aortic Atherosclerosis (ICD10-I70.0). Electronically Signed   By: Fidela Salisbury MD   On: 04/20/2020 21:20   DG Chest Port 1 View  Result Date: 04/20/2020 CLINICAL DATA:  Episode of bradycardia last night. EXAM: PORTABLE CHEST 1 VIEW COMPARISON:  Single-view of the chest 03/31/2019. FINDINGS: Cardiomegaly and vascular congestion. No consolidative process, pneumothorax or effusion. Atherosclerosis. No acute bony abnormality. IMPRESSION: Cardiomegaly and pulmonary vascular congestion. Aortic Atherosclerosis (ICD10-I70.0). Electronically Signed   By: Inge Rise M.D.   On: 04/20/2020 12:54   VAS US DUPLEX DIALYSIS ACCESS (AVF, AVG)  Result Date: 04/13/2020 DIALYSIS ACCESS Reason for Exam: Routine follow up. Access Site: Left Upper Extremity. Access Type: Radial-cephalic AVF. History: Right radiocephalic anastomosis  ligation with re-anastomosis to a more          proximal location of the radial artery. Performing Technologist: Ralene Cork RVT  Examination Guidelines: A complete evaluation includes B-mode imaging, spectral Doppler, color Doppler, and power Doppler as needed of all accessible portions of each vessel. Unilateral testing is considered an integral part of a complete examination. Limited examinations for reoccurring indications may be performed as noted.  Findings: +--------------------+----------+-----------------+--------+ AVF                 PSV (cm/s)Flow Vol (mL/min)Comments +--------------------+----------+-----------------+--------+ Native artery inflow   295           293                +--------------------+----------+-----------------+--------+ AVF Anastomosis        328                              +--------------------+----------+-----------------+--------+  +------------+----------+-------------+----------+--------+ OUTFLOW VEINPSV (cm/s)Diameter (cm)Depth (cm)Describe +------------+----------+-------------+----------+--------+ Prox Forearm  152        0.58        0.37            +------------+----------+-------------+----------+--------+ Mid Forearm               0.49        0.18            +------------+----------+-------------+----------+--------+ Dist Forearm   300        0.49        0.24            +------------+----------+-------------+----------+--------+ Retrograde radial artery distal to anastomosis. Mid outflow vein velocity image was lost.   Summary: Patent left radiocephalic AVF. Low volume flow. Retrograde flow in the radial artery distal to the anastomosis.  *See table(s) above for measurements and observations.  Diagnosing physician: Curt Jews MD Electronically signed by Curt Jews MD on 04/13/2020 at 4:06:36 PM.    --------------------------------------------------------------------------------   Final     (Echo, Carotid, EGD, Colonoscopy,  ERCP)    Subjective: Patient resting in bed he was asleep when I saw him this morning but he was easily arousable answers all my questions appropriately and wanted know if he can go home.  Discharge Exam: Vitals:   04/22/20 0413 04/22/20 1203  BP: (!) 139/52 111/65  Pulse: 97 96  Resp: 18 16  Temp: 98.2 F (36.8 C) (!) 97.2 F (36.2 C)  SpO2: 97% 90%   Vitals:   04/21/20 1832 04/21/20 2034 04/22/20 0413 04/22/20 1203  BP: (!) 163/42 101/65 (!) 139/52 111/65  Pulse: (!) 55 (!) 57 97 96  Resp: 17 18 18 16   Temp: 98.9 F (37.2 C) 98.5 F (36.9 C) 98.2 F (36.8 C) (!) 97.2 F (36.2 C)  TempSrc: Oral  Oral Oral  SpO2: 95% 92% 97% 90%  Weight:   117.8 kg   Height:        General: Pt is alert, awake, not in acute distress Cardiovascular: RRR, S1/S2 +, no rubs, no gallops Respiratory: CTA bilaterally, no wheezing, no rhonchi Abdominal: Soft, NT, ND, bowel sounds + Extremities: 1+ pitting edema  The results of significant diagnostics from this hospitalization (including imaging, microbiology, ancillary and laboratory) are listed below for reference.     Microbiology: Recent Results (from the past 240 hour(s))  SARS Coronavirus 2 by RT PCR (hospital order, performed in [facility] hospital lab) Nasopharyngeal Nasopharyngeal Swab     Status: None   Collection Time: 04/20/20  4:46 PM   Specimen: Nasopharyngeal Swab  Result Value Ref Range Status   SARS Coronavirus 2 NEGATIVE NEGATIVE Final    Comment: (NOTE) SARS-CoV-2 target nucleic acids are NOT DETECTED.  The SARS-CoV-2 RNA is generally detectable in upper and lower respiratory specimens during the acute phase of infection. The lowest concentration of SARS-CoV-2 viral copies this assay can detect is 250 copies / mL. A negative result does not preclude SARS-CoV-2 infection and should not be used as the sole basis for treatment or other patient management decisions.  A negative result may occur with improper specimen  collection / handling, submission of specimen other than nasopharyngeal swab, presence of viral mutation(s) within the areas targeted by this assay, and inadequate number of viral copies (<250 copies / mL). A negative result must be combined with clinical observations, patient history, and epidemiological information.  Fact Sheet for Patients:   StrictlyIdeas.no  Fact Sheet for Healthcare Providers: BankingDealers.co.za  This test is not yet approved or  cleared by the  [place] Architectural technologist and has been authorized for detection and/or diagnosis of SARS-CoV-2 by FDA under an Print production planner (EUA).  This EUA will remain in effect (meaning this test can be used) for the duration of the COVID-19 declaration under Section 564(b)(1) of the Act, 21 U.S.C. section 360bbb-3(b)(1), unless the authorization is terminated or revoked sooner.  Performed at [facility], [place] [address] [place], [place] [address]      Labs: BNP (last 3 results) No results for input(s): BNP in the last 8760 hours. Basic Metabolic Panel: Recent Labs  Lab 04/20/20 1247 04/21/20 0331 04/22/20 0630  NA 133* 135 138  K 5.2* 5.0 4.8  CL 101 104 105  CO2 19* 18* 20*  GLUCOSE 51* 127* 160*  BUN 90* 92* 90*  CREATININE 5.25* 5.15* 5.09*  CALCIUM 9.2 9.2  9.1 9.4  PHOS  --  6.0* 5.2*   Liver Function Tests: Recent Labs  Lab 04/21/20 0331 04/22/20 0630  ALBUMIN 2.9* 3.1*   No results for input(s): LIPASE, AMYLASE in the last 168 hours. No results for input(s): AMMONIA in the last 168 hours. CBC: Recent Labs  Lab 04/20/20 1247 04/21/20 0331 04/22/20 0630  WBC 7.8 7.6 10.4  HGB 10.3* 9.5* 11.0*  HCT 33.2* 31.8* 36.2*  MCV 83.6 84.1 83.4  PLT 203 196 212   Cardiac Enzymes: No results for input(s): CKTOTAL, CKMB, CKMBINDEX, TROPONINI in the last 168 hours. BNP: Invalid input(s): POCBNP CBG: Recent Labs  Lab 04/21/20 0734  04/21/20 1206 04/21/20 1716 04/21/20 2037 04/22/20 0554  GLUCAP 105* 110* 147* 122* 145*   D-Dimer No results for input(s): DDIMER in the last 72 hours. Hgb A1c Recent Labs    04/20/20 1247  HGBA1C 6.5*   Lipid Profile No results for input(s): CHOL, HDL, LDLCALC, TRIG, CHOLHDL, LDLDIRECT in the last 72 hours. Thyroid function studies Recent Labs    04/20/20 2204  TSH 4.985*   Anemia work up Recent Labs    04/20/20 2204  FERRITIN 321  TIBC 251  IRON 68   Urinalysis No results found for: COLORURINE, APPEARANCEUR, LABSPEC, [place], GLUCOSEU, HGBUR, BILIRUBINUR, KETONESUR, PROTEINUR, UROBILINOGEN, NITRITE, LEUKOCYTESUR Sepsis Labs Invalid input(s): PROCALCITONIN,  WBC,  LACTICIDVEN Microbiology Recent Results (from the past 240 hour(s))  SARS Coronavirus 2 by RT PCR (hospital order, performed in [facility] hospital lab) Nasopharyngeal Nasopharyngeal Swab     Status: None   Collection Time: 04/20/20  4:46 PM   Specimen: Nasopharyngeal Swab  Result Value Ref Range Status   SARS Coronavirus 2 NEGATIVE NEGATIVE Final    Comment: (NOTE) SARS-CoV-2 target nucleic acids are NOT DETECTED.  The SARS-CoV-2 RNA is generally detectable in upper and lower respiratory specimens during the acute phase of infection. The lowest concentration of SARS-CoV-2 viral copies this assay can detect is 250 copies / mL. A negative result does not preclude SARS-CoV-2 infection and should not be used as the sole basis for treatment or other patient management decisions.  A negative result may occur with improper specimen collection / handling, submission of specimen other than nasopharyngeal swab, presence of viral mutation(s) within the areas targeted by this assay, and inadequate number of viral copies (<250 copies / mL). A negative result must be combined with clinical observations, patient history, and epidemiological information.  Fact Sheet for Patients:    StrictlyIdeas.no  Fact Sheet for Healthcare Providers: BankingDealers.co.za  This test is not yet approved or  cleared by the [place] FDA and has been authorized  for detection and/or diagnosis of SARS-CoV-2 by FDA under an Emergency Use Authorization (EUA).  This EUA will remain in effect (meaning this test can be used) for the duration of the COVID-19 declaration under Section 564(b)(1) of the Act, 21 U.S.C. section 360bbb-3(b)(1), unless the authorization is terminated or revoked sooner.  Performed at [facility], [place] [address], [place], [place] [address]      Time coordinating discharge:  39 minutes  SIGNED:   Georgette Shell, MD  [facility] 04/22/2020, 12:20 PM Pager   If 7PM-7AM, please contact night-coverage [email or web address] Password [email or web address]

## 2020-04-22 NOTE — Evaluation (Signed)
Physical Therapy Evaluation Patient Details Name: David Nguyen MRN: [id number] DOB: 1941-07-25 Today's Date: 04/22/2020   History of Present Illness  Pt is a 79 y.o. male with medical history significant of OSA; hypothyroidism; HTN; HLD; DM; and CKD (nearing need for HD) presenting with bradycardia.  Clinical Impression  Pt admitted with above diagnosis. Pt able to ambulate 25' with RW and min guard.  Pt had c/o L shin pain that has been present for 5 years with increased pain in the last year.  Pain increases with walking.  Pt reports he has been told it is arthritis; however, pain is not in knee, located in shin (at admission H and P said calf pain - pt denied calf pain).  Of note pt with visual vascular changes in L LE compared to R LE.  Pt would benefit from use of RW at home for improved stability and decreased load on L LE.   Pt currently with functional limitations due to the deficits listed below (see PT Problem List). Pt will benefit from skilled PT to increase their independence and safety with mobility to allow discharge to the venue listed below.       Follow Up Recommendations Home health PT    Equipment Recommendations  Rolling walker with 5" wheels    Recommendations for Other Services       Precautions / Restrictions Precautions Precautions: Fall      Mobility  Bed Mobility Overal bed mobility: Needs Assistance Bed Mobility: Supine to Sit;Sit to Supine     Supine to sit: Min guard     General bed mobility comments: increased time and effort  Transfers Overall transfer level: Needs assistance Equipment used: Rolling walker (2 wheeled) Transfers: Sit to/from Stand Sit to Stand: Supervision         General transfer comment: cues for safe hand placement  Ambulation/Gait Ambulation/Gait assistance: Min guard Gait Distance (Feet): 80 Feet Assistive device: Rolling walker (2 wheeled) Gait Pattern/deviations: Step-through pattern;Decreased weight shift to  left Gait velocity: decreased   General Gait Details: educated on RW use to relief pressure from L LE due to pain; pt's pain increased with increased distance; pain is in shin  Stairs            Wheelchair Mobility    Modified Rankin (Stroke Patients Only)       Balance Overall balance assessment: Needs assistance Sitting-balance support: No upper extremity supported;Feet supported Sitting balance-Leahy Scale: Normal     Standing balance support: No upper extremity supported;During functional activity Standing balance-Leahy Scale: Good                               Pertinent Vitals/Pain Pain Assessment: 0-10 Pain Score:  (3/10 rest; 6/10 walking (increased with increased distance); back to 3/10 rest) Pain Location: L shin Pain Descriptors / Indicators: Other (Comment) ("just hurts") Pain Intervention(s): Limited activity within patient's tolerance;Monitored during session;Repositioned    Home Living Family/patient expects to be discharged to:: Private residence Living Arrangements: Spouse/significant other Available Help at Discharge: Family;Available 24 hours/day Type of Home: House Home Access: Stairs to enter Entrance Stairs-Rails: None Entrance Stairs-Number of Steps: reports 2 small steps Home Layout: One level Home Equipment: None      Prior Function Level of Independence: Needs assistance   Gait / Transfers Assistance Needed: Reports normally able to ambulate short community distances but with painful L knee due to arthritis; has not tried AD  ADL's / Homemaking Assistance Needed: reports independent with ADLs and IADLs        Hand Dominance        Extremity/Trunk Assessment   Upper Extremity Assessment Upper Extremity Assessment: Overall WFL for tasks assessed    Lower Extremity Assessment Lower Extremity Assessment: LLE deficits/detail;RLE deficits/detail RLE Deficits / Details: ROM WFL ; MMT 5/5 LLE Deficits / Details: ROM:  WFL but decreased DF compared to R; MMT 5/5; noted hemosiderin staining, edema, and vericose veins compared to R LE LLE Sensation: WNL    Cervical / Trunk Assessment Cervical / Trunk Assessment: Normal  Communication   Communication: HOH  Cognition Arousal/Alertness: Awake/alert Behavior During Therapy: WFL for tasks assessed/performed Overall Cognitive Status: Within Functional Limits for tasks assessed                                 General Comments: Able to recall month and year with increased time; aware of situation      General Comments General comments (skin integrity, edema, etc.): Pt with c/o chronic pain in L shin.  Reports present for 5 years and worst over the past year.  Hurts when walking.  He reports he has been told he has arthritis in knee; however, this pain is in his shin.  Did not hurt with palpation and was relieved with rest.  Reports hurts with walking.  Of note pt with increased hemosiderin staining, vericose veins, and edema in L LE compared to R LE. Reports he wears compression stockings at home and that helps pain some.   Educated on recommendations for use of RW and HHPT.    Exercises     Assessment/Plan    PT Assessment Patient needs continued PT services  PT Problem List Decreased strength;Decreased mobility;Decreased safety awareness;Decreased range of motion;Decreased coordination;Decreased knowledge of precautions;Decreased activity tolerance;Cardiopulmonary status limiting activity;Decreased balance;Decreased knowledge of use of DME;Pain       PT Treatment Interventions DME instruction;Therapeutic activities;Modalities;Gait training;Therapeutic exercise;Patient/family education;Stair training;Balance training;Functional mobility training    PT Goals (Current goals can be found in the Care Plan section)  Acute Rehab PT Goals Patient Stated Goal: return home; decrease pain PT Goal Formulation: With patient Time For Goal Achievement:  05/06/20 Potential to Achieve Goals: Good    Frequency Min 3X/week   Barriers to discharge        Co-evaluation               AM-PAC PT "6 Clicks" Mobility  Outcome Measure Help needed turning from your back to your side while in a flat bed without using bedrails?: A Little Help needed moving from lying on your back to sitting on the side of a flat bed without using bedrails?: A Little Help needed moving to and from a bed to a chair (including a wheelchair)?: None Help needed standing up from a chair using your arms (e.g., wheelchair or bedside chair)?: None Help needed to walk in hospital room?: None Help needed climbing 3-5 steps with a railing? : A Little 6 Click Score: 21    End of Session   Activity Tolerance: Patient tolerated treatment well Patient left: with chair alarm set;in chair;with call bell/phone within reach Nurse Communication: Mobility status PT Visit Diagnosis: Pain;Unsteadiness on feet (R26.81);Other abnormalities of gait and mobility (R26.89) Pain - Right/Left: Left Pain - part of body: Leg    Time: 1010-1047 PT Time Calculation (min) (ACUTE ONLY): 37 min  Charges:   PT Evaluation $PT Eval Low Complexity: 1 Low PT Treatments $Gait Training: 8-22 mins        Abran Richard, PT Acute Rehab Services Pager [phone] [facility] 04/22/2020, 11:02 AM

## 2020-04-22 NOTE — Progress Notes (Signed)
[facility] Progress Note   Subjective:   Seen in room this AM.  Tolerated some dinner w/o emesis last night but still endorsing anorexia.  Still set against HD until f/u with Dr. Carolin Sicks.    Objective Vitals:   04/21/20 1825 04/21/20 1832 04/21/20 2034 04/22/20 0413  BP:  (!) 163/42 101/65 (!) 139/52  Pulse:  (!) 55 (!) 57 97  Resp:  17 18 18   Temp:  98.9 F (37.2 C) 98.5 F (36.9 C) 98.2 F (36.8 C)  TempSrc:  Oral  Oral  SpO2:  95% 92% 97%  Weight: 120.5 kg   117.8 kg  Height: 6' (1.829 m)      Physical Exam General: lying comfortably in bed Heart: RRR Lungs: normal WOB on RA, clear ant and laterally Abdomen: soft Extremities: trace edema - improved c/w yesterday Dialysis Access:  LFA AVF +t/b, very small  Additional Objective Labs: Basic Metabolic Panel: Recent Labs  Lab 04/20/20 1247 04/21/20 0331 04/22/20 0630  NA 133* 135 138  K 5.2* 5.0 4.8  CL 101 104 105  CO2 19* 18* 20*  GLUCOSE 51* 127* 160*  BUN 90* 92* 90*  CREATININE 5.25* 5.15* 5.09*  CALCIUM 9.2 9.2  9.1 9.4  PHOS  --  6.0* 5.2*   Liver Function Tests: Recent Labs  Lab 04/21/20 0331 04/22/20 0630  ALBUMIN 2.9* 3.1*   No results for input(s): LIPASE, AMYLASE in the last 168 hours. CBC: Recent Labs  Lab 04/20/20 1247 04/21/20 0331 04/22/20 0630  WBC 7.8 7.6 10.4  HGB 10.3* 9.5* 11.0*  HCT 33.2* 31.8* 36.2*  MCV 83.6 84.1 83.4  PLT 203 196 212   Blood Culture No results found for: SDES, SPECREQUEST, CULT, REPTSTATUS  Cardiac Enzymes: No results for input(s): CKTOTAL, CKMB, CKMBINDEX, TROPONINI in the last 168 hours. CBG: Recent Labs  Lab 04/21/20 0734 04/21/20 1206 04/21/20 1716 04/21/20 2037 04/22/20 0554  GLUCAP 105* 110* 147* 122* 145*   Iron Studies:  Recent Labs    04/20/20 2204  IRON 68  TIBC 251  FERRITIN 321   @[email or web address] @ Studies/Results: CT ABDOMEN PELVIS WO CONTRAST  Result Date: 04/20/2020 CLINICAL DATA:  Scrotal swelling/edema  EXAM: CT ABDOMEN AND PELVIS WITHOUT CONTRAST TECHNIQUE: Multidetector CT imaging of the abdomen and pelvis was performed following the standard protocol without IV contrast. COMPARISON:  None. FINDINGS: Lower chest: There is engorgement of the dependent pulmonary vasculature and interstitial thickening most in keeping with pulmonary vascular congestion and trace pulmonary edema, possibly cardiogenic in nature. There is extensive coronary artery calcification. Global cardiac size within normal limits. Small pericardial effusion is present small hiatal hernia noted. Hepatobiliary: Liver and gallbladder are unremarkable. Pancreas: Unremarkable Spleen: Unremarkable Adrenals/Urinary Tract: Left adrenalectomy has been performed. A right adrenal gland is unremarkable. Horseshoe kidney noted no hydronephrosis. No intrarenal or ureteral calculi. Accessory renal arteries arise from the a distal abdominal aorta as well as the right common iliac artery. The bladder is mildly distended, but is otherwise unremarkable. Stomach/Bowel: Stomach, small bowel, and large bowel are unremarkable. Appendix normal. No free intraperitoneal gas or fluid. Vascular/Lymphatic: No pathologic adenopathy within the abdomen and pelvis. Extensive aortoiliac atherosclerotic calcification is present without evidence of aneurysm. Particularly prominent calcification is seen within the proximal and mid superior mesenteric artery. Reproductive: Unremarkable. Other: A small fat containing ventral hernia is seen within the epigastrium. A small fat containing umbilical hernia is present. A a moderate to large right indirect inguinal hernia is present with fat extending into  the scrotal sac. Musculoskeletal: Osseous structures are age-appropriate. IMPRESSION: Moderate to large right indirect inguinal hernia with herniation of mesenteric fat into the scrotal sac, likely accounting for the patient's apparent scrotal swelling. Additional small fat containing  epigastric ventral and umbilical hernias. Trace pulmonary edema likely related to mild cardiogenic failure. Aortic Atherosclerosis (ICD10-I70.0). Electronically Signed   By: Fidela Salisbury MD   On: 04/20/2020 21:20   DG Chest Port 1 View  Result Date: 04/20/2020 CLINICAL DATA:  Episode of bradycardia last night. EXAM: PORTABLE CHEST 1 VIEW COMPARISON:  Single-view of the chest 03/31/2019. FINDINGS: Cardiomegaly and vascular congestion. No consolidative process, pneumothorax or effusion. Atherosclerosis. No acute bony abnormality. IMPRESSION: Cardiomegaly and pulmonary vascular congestion. Aortic Atherosclerosis (ICD10-I70.0). Electronically Signed   By: Inge Rise M.D.   On: 04/20/2020 12:54   Medications:  . allopurinol  200 mg Oral Daily  . amLODipine  10 mg Oral Q1500  . aspirin EC  81 mg Oral Daily  . atorvastatin  40 mg Oral Q1500  . calcitRIOL  0.25 mcg Oral Daily  . doxazosin  8 mg Oral Q1500  . fluticasone  2 spray Each Nare Daily  . furosemide  60 mg Intravenous BID  . heparin  5,000 Units Subcutaneous Q8H  . insulin aspart  0-9 Units Subcutaneous TID WC  . insulin aspart protamine- aspart  16 Units Subcutaneous BID WC  . levothyroxine  75 mcg Oral QAC breakfast  . loratadine  10 mg Oral Daily  . sodium chloride flush  3 mL Intravenous Q12H    Assessment/Plan **Nausea/vomiting:  x 1 prior to presentation - also with anorexia.  I suspect this is uremia and have recommended HD, see below.  **Bradycardia:  Cardiology aware, currently normotensive.  Monitor on telemetry.  BB on hold.   **CKD 5:  BUN in 90s, Cr 5+.  Progressive decline over months.  Recommended initiate HD for uremic symptoms and d/w primary nephrologist who agreed.  Pt declines in favor of waiting to f/u outpt.  I've expressed several times risk for having a clinical decline in face of anorexia - malnutrition, loss of strength and independence, risk for decompensation.  He is still declining dialysis  initiation.  Wife was present for discussion yesterday.  Today I also reiterated he can choose conservative management as well should he change mind about HD.  He will consider options and f/u outpt.  **HFpEF: 03/2019 TTE with normal EF, grade 3 DD.  Evidence of volume overload on CXR. Diuresed with lasix 60 IV bid inpt, outpt dose was lasix 80 daily, increase to 80 BID on d/c.  **Secondary hyperparathyroidism:  Cont home calcitriol dose,  PTH and phos ok. Low phos diet.   **Anemia of CKD: on retacrit 30k q3wks - last 6/29 held due to Hb 12.7. Hb 10.3 on admission, 11 today, monitor.   **DM: per primary  **HTN: BPs ok.  Last 139/52.  **Inguinal hernia:  On CT at admission, says had urology appt already arranged.  OK for d/c and f/u with Dr. Carolin Sicks as already scheduled.  Jannifer Hick MD 04/22/2020, 11:12 AM  [facility] Pager: [phone]

## 2020-04-22 NOTE — TOC Initial Note (Signed)
Transition of Care [facility]) - Initial/Assessment Note    Patient Details  Name: David Nguyen MRN: [id number] Date of Birth: [birth year]  Transition of [facility]) CM/SW Contact:    Zenon Mayo, RN Phone Number: 04/22/2020, 12:43 PM  Clinical Narrative:                 NCM offered choice for HHPT, he states he has no preference, NCM made referral to [facility] with [facility] for HHPT, he is able to take referral. Soc to begin 24 to 48 hrs post dc.  He will also need a rolling walker, NCM made referral to [facility] with [employer], walker will be delivered to room prior to dc.   Expected Discharge Plan: [place] Barriers to Discharge: No Barriers Identified   Patient Goals and CMS Choice Patient states their goals for this hospitalization and ongoing recovery are:: get better CMS Medicare.gov Compare Post Acute Care list provided to:: Patient Choice offered to / list presented to : Patient  Expected Discharge Plan and Services Expected Discharge Plan: [place]   Discharge Planning Services: CM Consult Post Acute Care Choice: [place] arrangements for the past 2 months: Single Family Home Expected Discharge Date: 04/22/20               DME Arranged: [place] Rile rolling DME Agency: [employer] Date DME Agency Contacted: 04/22/20 Time DME Agency Contacted: [phone] Representative spoke with at DME Agency: [place]: PT [place]: [place] Date [place]: 04/22/20 Time [place]: 1242 Representative spoke with at [place]: Tommi Rumps  Prior Living Arrangements/Services Living arrangements for the past 2 months: [place] Lives with:: Spouse Patient language and need for interpreter reviewed:: Yes Do you feel safe going back to the place where you live?: Yes      Need for Family Participation in Patient Care: Yes (Comment) Care giver support system in place?: Yes (comment)   Criminal Activity/Legal  Involvement Pertinent to Current Situation/Hospitalization: No - Comment as needed  Activities of Daily Living Home Assistive Devices/Equipment: Dentures (specify type), CBG Meter, Eyeglasses, CPAP ADL Screening (condition at time of admission) Patient's cognitive ability adequate to safely complete daily activities?: Yes Is the patient deaf or have difficulty hearing?: No Does the patient have difficulty seeing, even when wearing glasses/contacts?: No Does the patient have difficulty concentrating, remembering, or making decisions?: No Patient able to express need for assistance with ADLs?: Yes Does the patient have difficulty dressing or bathing?: No Independently performs ADLs?: Yes (appropriate for developmental age) Does the patient have difficulty walking or climbing stairs?: Yes Weakness of Legs: Both Weakness of Arms/Hands: None  Permission Sought/Granted                  Emotional Assessment Appearance:: Appears stated age Attitude/Demeanor/Rapport: Engaged Affect (typically observed): Appropriate Orientation: : Oriented to Place, Oriented to Self, Oriented to  Time, Oriented to Situation Alcohol / Substance Use: Not Applicable Psych Involvement: No (comment)  Admission diagnosis:  Bradycardia [R00.1] ESRD (end stage renal disease) ([place]) [N18.6] Symptomatic bradycardia [R00.1] Patient Active Problem List   Diagnosis Date Noted  . Symptomatic bradycardia 04/20/2020  . Scrotal edema 04/20/2020  . Primary osteoarthritis of left knee 08/14/2019  . Chronic diastolic CHF (congestive heart failure) ([place]) 04/04/2019  . Pulmonary hypertension ([place]) 04/04/2019  . Sinus tachycardia 04/04/2019  . CKD (chronic kidney disease), stage V ([place]) 04/04/2019  . Diabetes mellitus type 2,  controlled, with complications ([place]) 46/96/2952  . Anemia of chronic disease 04/04/2019  . Obesity, diabetes, and hypertension syndrome ([place]) 04/04/2019  . Horseshoe kidney   . Bradycardia  03/31/2019  . Hypothyroidism 03/31/2019  . Hyperkalemia 03/31/2019  . CKD stage 4 due to type 2 diabetes mellitus ([place]) 04/15/2018  . History of anemia due to CKD 04/15/2018  . Morbid obesity due to excess calories ([place]) 07/13/2017  . DIABETES, TYPE 2 05/26/2008  . HYPERLIPIDEMIA 05/26/2008  . Essential hypertension 05/26/2008  . Allergic rhinitis 05/26/2008  . Sleep apnea 05/26/2008   PCP:  [name], MD Pharmacy:   [facility] [address] ([address]), [place] - [address] [place] [address] Phone: [phone] Fax: [phone]     Social Determinants of Health (SDOH) Interventions Transportation Interventions: Intervention Not Indicated  Readmission Risk Interventions Readmission Risk Prevention Plan 04/22/2020  Transportation Screening Complete  PCP or Specialist Appt within 3-5 Days Complete  HRI or [place] Complete  Social Work Consult for [place] Planning/Counseling Complete  Palliative Care Screening Not Applicable  Medication Review Press photographer) Complete  Some recent data might be hidden

## 2020-04-26 ENCOUNTER — Other Ambulatory Visit: Payer: Self-pay

## 2020-04-26 DIAGNOSIS — R001 Bradycardia, unspecified: Secondary | ICD-10-CM | POA: Diagnosis not present

## 2020-04-26 DIAGNOSIS — D631 Anemia in chronic kidney disease: Secondary | ICD-10-CM | POA: Diagnosis not present

## 2020-04-26 DIAGNOSIS — E1129 Type 2 diabetes mellitus with other diabetic kidney complication: Secondary | ICD-10-CM | POA: Diagnosis not present

## 2020-04-26 DIAGNOSIS — M79605 Pain in left leg: Secondary | ICD-10-CM | POA: Diagnosis not present

## 2020-04-26 DIAGNOSIS — N184 Chronic kidney disease, stage 4 (severe): Secondary | ICD-10-CM | POA: Diagnosis not present

## 2020-04-26 DIAGNOSIS — I130 Hypertensive heart and chronic kidney disease with heart failure and stage 1 through stage 4 chronic kidney disease, or unspecified chronic kidney disease: Secondary | ICD-10-CM | POA: Diagnosis not present

## 2020-04-26 DIAGNOSIS — N509 Disorder of male genital organs, unspecified: Secondary | ICD-10-CM | POA: Diagnosis not present

## 2020-04-26 NOTE — Patient Outreach (Signed)
[facility]) Care Management  04/26/2020  David Nguyen 1941-02-07 [id number]    EMMI-General Discharge RED ON EMMI ALERT Day # 1 Date: 04/24/2020 Red Alert Reason: "Know who to call about changes in condition? No"   Outreach attempt #1 to patient. Spoke with patient who denies any acute issues or concerns at present. Reviewed and addressed red alert with patient. He does not recall getting automated call or responding that way. He confirms that he has his MDs contact info and know when, how and why to contact them. He goes for PCP follow up appt today and no issues with transportation. Patient confirms he has all his meds in the home and no issues or concerns regarding them. He denies any RN CM needs or concerns at this time.       Plan: RN CM will close case at this time.  Enzo [place], RN,BSN,CCM [place] Management Telephonic Care Management Coordinator Direct Phone: [phone] Toll Free: [phone] Fax: [phone]

## 2020-04-27 ENCOUNTER — Encounter (HOSPITAL_COMMUNITY): Payer: Medicare HMO

## 2020-04-28 DIAGNOSIS — I5032 Chronic diastolic (congestive) heart failure: Secondary | ICD-10-CM | POA: Diagnosis not present

## 2020-04-28 DIAGNOSIS — I501 Left ventricular failure: Secondary | ICD-10-CM | POA: Diagnosis not present

## 2020-04-28 DIAGNOSIS — I700 Atherosclerosis of aorta: Secondary | ICD-10-CM | POA: Diagnosis not present

## 2020-04-28 DIAGNOSIS — I132 Hypertensive heart and chronic kidney disease with heart failure and with stage 5 chronic kidney disease, or end stage renal disease: Secondary | ICD-10-CM | POA: Diagnosis not present

## 2020-04-28 DIAGNOSIS — N186 End stage renal disease: Secondary | ICD-10-CM | POA: Diagnosis not present

## 2020-04-28 DIAGNOSIS — E1122 Type 2 diabetes mellitus with diabetic chronic kidney disease: Secondary | ICD-10-CM | POA: Diagnosis not present

## 2020-04-28 DIAGNOSIS — N5089 Other specified disorders of the male genital organs: Secondary | ICD-10-CM | POA: Diagnosis not present

## 2020-04-28 DIAGNOSIS — D638 Anemia in other chronic diseases classified elsewhere: Secondary | ICD-10-CM | POA: Diagnosis not present

## 2020-04-28 DIAGNOSIS — I10 Essential (primary) hypertension: Secondary | ICD-10-CM | POA: Diagnosis not present

## 2020-04-28 DIAGNOSIS — E1165 Type 2 diabetes mellitus with hyperglycemia: Secondary | ICD-10-CM | POA: Diagnosis not present

## 2020-04-28 DIAGNOSIS — R001 Bradycardia, unspecified: Secondary | ICD-10-CM | POA: Diagnosis not present

## 2020-04-28 DIAGNOSIS — D509 Iron deficiency anemia, unspecified: Secondary | ICD-10-CM | POA: Diagnosis not present

## 2020-04-28 DIAGNOSIS — K4090 Unilateral inguinal hernia, without obstruction or gangrene, not specified as recurrent: Secondary | ICD-10-CM | POA: Diagnosis not present

## 2020-04-29 DIAGNOSIS — K4090 Unilateral inguinal hernia, without obstruction or gangrene, not specified as recurrent: Secondary | ICD-10-CM | POA: Diagnosis not present

## 2020-04-30 DIAGNOSIS — I5032 Chronic diastolic (congestive) heart failure: Secondary | ICD-10-CM | POA: Diagnosis not present

## 2020-04-30 DIAGNOSIS — E1122 Type 2 diabetes mellitus with diabetic chronic kidney disease: Secondary | ICD-10-CM | POA: Diagnosis not present

## 2020-04-30 DIAGNOSIS — I132 Hypertensive heart and chronic kidney disease with heart failure and with stage 5 chronic kidney disease, or end stage renal disease: Secondary | ICD-10-CM | POA: Diagnosis not present

## 2020-04-30 DIAGNOSIS — E1165 Type 2 diabetes mellitus with hyperglycemia: Secondary | ICD-10-CM | POA: Diagnosis not present

## 2020-04-30 DIAGNOSIS — K4090 Unilateral inguinal hernia, without obstruction or gangrene, not specified as recurrent: Secondary | ICD-10-CM | POA: Diagnosis not present

## 2020-04-30 DIAGNOSIS — I501 Left ventricular failure: Secondary | ICD-10-CM | POA: Diagnosis not present

## 2020-04-30 DIAGNOSIS — N186 End stage renal disease: Secondary | ICD-10-CM | POA: Diagnosis not present

## 2020-04-30 DIAGNOSIS — R001 Bradycardia, unspecified: Secondary | ICD-10-CM | POA: Diagnosis not present

## 2020-04-30 DIAGNOSIS — N5089 Other specified disorders of the male genital organs: Secondary | ICD-10-CM | POA: Diagnosis not present

## 2020-05-03 DIAGNOSIS — I132 Hypertensive heart and chronic kidney disease with heart failure and with stage 5 chronic kidney disease, or end stage renal disease: Secondary | ICD-10-CM | POA: Diagnosis not present

## 2020-05-03 DIAGNOSIS — R001 Bradycardia, unspecified: Secondary | ICD-10-CM | POA: Diagnosis not present

## 2020-05-03 DIAGNOSIS — N186 End stage renal disease: Secondary | ICD-10-CM | POA: Diagnosis not present

## 2020-05-03 DIAGNOSIS — E1122 Type 2 diabetes mellitus with diabetic chronic kidney disease: Secondary | ICD-10-CM | POA: Diagnosis not present

## 2020-05-03 DIAGNOSIS — I5032 Chronic diastolic (congestive) heart failure: Secondary | ICD-10-CM | POA: Diagnosis not present

## 2020-05-03 DIAGNOSIS — I501 Left ventricular failure: Secondary | ICD-10-CM | POA: Diagnosis not present

## 2020-05-03 DIAGNOSIS — K4090 Unilateral inguinal hernia, without obstruction or gangrene, not specified as recurrent: Secondary | ICD-10-CM | POA: Diagnosis not present

## 2020-05-03 DIAGNOSIS — E1165 Type 2 diabetes mellitus with hyperglycemia: Secondary | ICD-10-CM | POA: Diagnosis not present

## 2020-05-03 DIAGNOSIS — N5089 Other specified disorders of the male genital organs: Secondary | ICD-10-CM | POA: Diagnosis not present

## 2020-05-06 DIAGNOSIS — D631 Anemia in chronic kidney disease: Secondary | ICD-10-CM | POA: Diagnosis not present

## 2020-05-06 DIAGNOSIS — Q600 Renal agenesis, unilateral: Secondary | ICD-10-CM | POA: Diagnosis not present

## 2020-05-06 DIAGNOSIS — R609 Edema, unspecified: Secondary | ICD-10-CM | POA: Diagnosis not present

## 2020-05-06 DIAGNOSIS — E1122 Type 2 diabetes mellitus with diabetic chronic kidney disease: Secondary | ICD-10-CM | POA: Diagnosis not present

## 2020-05-06 DIAGNOSIS — M109 Gout, unspecified: Secondary | ICD-10-CM | POA: Diagnosis not present

## 2020-05-06 DIAGNOSIS — I132 Hypertensive heart and chronic kidney disease with heart failure and with stage 5 chronic kidney disease, or end stage renal disease: Secondary | ICD-10-CM | POA: Diagnosis not present

## 2020-05-06 DIAGNOSIS — E1165 Type 2 diabetes mellitus with hyperglycemia: Secondary | ICD-10-CM | POA: Diagnosis not present

## 2020-05-06 DIAGNOSIS — N185 Chronic kidney disease, stage 5: Secondary | ICD-10-CM | POA: Diagnosis not present

## 2020-05-06 DIAGNOSIS — K4090 Unilateral inguinal hernia, without obstruction or gangrene, not specified as recurrent: Secondary | ICD-10-CM | POA: Diagnosis not present

## 2020-05-06 DIAGNOSIS — I501 Left ventricular failure: Secondary | ICD-10-CM | POA: Diagnosis not present

## 2020-05-06 DIAGNOSIS — N186 End stage renal disease: Secondary | ICD-10-CM | POA: Diagnosis not present

## 2020-05-06 DIAGNOSIS — I5032 Chronic diastolic (congestive) heart failure: Secondary | ICD-10-CM | POA: Diagnosis not present

## 2020-05-06 DIAGNOSIS — N2581 Secondary hyperparathyroidism of renal origin: Secondary | ICD-10-CM | POA: Diagnosis not present

## 2020-05-06 DIAGNOSIS — N5089 Other specified disorders of the male genital organs: Secondary | ICD-10-CM | POA: Diagnosis not present

## 2020-05-06 DIAGNOSIS — I770 Arteriovenous fistula, acquired: Secondary | ICD-10-CM | POA: Diagnosis not present

## 2020-05-06 DIAGNOSIS — R001 Bradycardia, unspecified: Secondary | ICD-10-CM | POA: Diagnosis not present

## 2020-05-07 ENCOUNTER — Other Ambulatory Visit: Payer: Self-pay

## 2020-05-07 ENCOUNTER — Encounter (HOSPITAL_COMMUNITY)
Admission: RE | Admit: 2020-05-07 | Discharge: 2020-05-07 | Disposition: A | Discharge status: Home / Self Care | Payer: Medicare HMO | Source: Ambulatory Visit | Attending: Nephrology | Admitting: Nephrology

## 2020-05-07 VITALS — BP 141/64 | HR 80 | Temp 96.4°F | Resp 20

## 2020-05-07 DIAGNOSIS — N186 End stage renal disease: Secondary | ICD-10-CM | POA: Diagnosis not present

## 2020-05-07 DIAGNOSIS — I132 Hypertensive heart and chronic kidney disease with heart failure and with stage 5 chronic kidney disease, or end stage renal disease: Secondary | ICD-10-CM | POA: Diagnosis not present

## 2020-05-07 DIAGNOSIS — I501 Left ventricular failure: Secondary | ICD-10-CM | POA: Diagnosis not present

## 2020-05-07 DIAGNOSIS — R001 Bradycardia, unspecified: Secondary | ICD-10-CM | POA: Diagnosis not present

## 2020-05-07 DIAGNOSIS — E1122 Type 2 diabetes mellitus with diabetic chronic kidney disease: Secondary | ICD-10-CM | POA: Diagnosis not present

## 2020-05-07 DIAGNOSIS — Z862 Personal history of diseases of the blood and blood-forming organs and certain disorders involving the immune mechanism: Secondary | ICD-10-CM | POA: Insufficient documentation

## 2020-05-07 DIAGNOSIS — I5032 Chronic diastolic (congestive) heart failure: Secondary | ICD-10-CM | POA: Diagnosis not present

## 2020-05-07 DIAGNOSIS — K4090 Unilateral inguinal hernia, without obstruction or gangrene, not specified as recurrent: Secondary | ICD-10-CM | POA: Diagnosis not present

## 2020-05-07 DIAGNOSIS — N189 Chronic kidney disease, unspecified: Secondary | ICD-10-CM | POA: Insufficient documentation

## 2020-05-07 DIAGNOSIS — N5089 Other specified disorders of the male genital organs: Secondary | ICD-10-CM | POA: Diagnosis not present

## 2020-05-07 DIAGNOSIS — E1165 Type 2 diabetes mellitus with hyperglycemia: Secondary | ICD-10-CM | POA: Diagnosis not present

## 2020-05-07 LAB — POCT HEMOGLOBIN-HEMACUE: Hemoglobin: 11.1 g/dL — ABNORMAL LOW (ref 13.0–17.0)

## 2020-05-07 MED ORDER — EPOETIN ALFA-EPBX 10000 UNIT/ML IJ SOLN
INTRAMUSCULAR | Status: AC
  Administered 2020-05-07: 30000 [IU] via SUBCUTANEOUS
  Filled 2020-05-07: qty 3

## 2020-05-07 MED ORDER — EPOETIN ALFA-EPBX 40000 UNIT/ML IJ SOLN: 30000.0000 [IU] | INTRAMUSCULAR | Status: DC

## 2020-05-11 ENCOUNTER — Encounter (HOSPITAL_COMMUNITY): Payer: Medicare HMO

## 2020-05-11 DIAGNOSIS — E1165 Type 2 diabetes mellitus with hyperglycemia: Secondary | ICD-10-CM | POA: Diagnosis not present

## 2020-05-11 DIAGNOSIS — N5089 Other specified disorders of the male genital organs: Secondary | ICD-10-CM | POA: Diagnosis not present

## 2020-05-11 DIAGNOSIS — I501 Left ventricular failure: Secondary | ICD-10-CM | POA: Diagnosis not present

## 2020-05-11 DIAGNOSIS — N186 End stage renal disease: Secondary | ICD-10-CM | POA: Diagnosis not present

## 2020-05-11 DIAGNOSIS — R001 Bradycardia, unspecified: Secondary | ICD-10-CM | POA: Diagnosis not present

## 2020-05-11 DIAGNOSIS — K4090 Unilateral inguinal hernia, without obstruction or gangrene, not specified as recurrent: Secondary | ICD-10-CM | POA: Diagnosis not present

## 2020-05-11 DIAGNOSIS — E1122 Type 2 diabetes mellitus with diabetic chronic kidney disease: Secondary | ICD-10-CM | POA: Diagnosis not present

## 2020-05-11 DIAGNOSIS — I5032 Chronic diastolic (congestive) heart failure: Secondary | ICD-10-CM | POA: Diagnosis not present

## 2020-05-11 DIAGNOSIS — I132 Hypertensive heart and chronic kidney disease with heart failure and with stage 5 chronic kidney disease, or end stage renal disease: Secondary | ICD-10-CM | POA: Diagnosis not present

## 2020-05-12 DIAGNOSIS — E1165 Type 2 diabetes mellitus with hyperglycemia: Secondary | ICD-10-CM | POA: Diagnosis not present

## 2020-05-12 DIAGNOSIS — N5089 Other specified disorders of the male genital organs: Secondary | ICD-10-CM | POA: Diagnosis not present

## 2020-05-12 DIAGNOSIS — I501 Left ventricular failure: Secondary | ICD-10-CM | POA: Diagnosis not present

## 2020-05-12 DIAGNOSIS — E1122 Type 2 diabetes mellitus with diabetic chronic kidney disease: Secondary | ICD-10-CM | POA: Diagnosis not present

## 2020-05-12 DIAGNOSIS — R001 Bradycardia, unspecified: Secondary | ICD-10-CM | POA: Diagnosis not present

## 2020-05-12 DIAGNOSIS — K4090 Unilateral inguinal hernia, without obstruction or gangrene, not specified as recurrent: Secondary | ICD-10-CM | POA: Diagnosis not present

## 2020-05-12 DIAGNOSIS — I132 Hypertensive heart and chronic kidney disease with heart failure and with stage 5 chronic kidney disease, or end stage renal disease: Secondary | ICD-10-CM | POA: Diagnosis not present

## 2020-05-12 DIAGNOSIS — I5032 Chronic diastolic (congestive) heart failure: Secondary | ICD-10-CM | POA: Diagnosis not present

## 2020-05-12 DIAGNOSIS — N186 End stage renal disease: Secondary | ICD-10-CM | POA: Diagnosis not present

## 2020-05-14 DIAGNOSIS — N186 End stage renal disease: Secondary | ICD-10-CM | POA: Diagnosis not present

## 2020-05-14 DIAGNOSIS — E1122 Type 2 diabetes mellitus with diabetic chronic kidney disease: Secondary | ICD-10-CM | POA: Diagnosis not present

## 2020-05-14 DIAGNOSIS — R001 Bradycardia, unspecified: Secondary | ICD-10-CM | POA: Diagnosis not present

## 2020-05-14 DIAGNOSIS — E1165 Type 2 diabetes mellitus with hyperglycemia: Secondary | ICD-10-CM | POA: Diagnosis not present

## 2020-05-14 DIAGNOSIS — N5089 Other specified disorders of the male genital organs: Secondary | ICD-10-CM | POA: Diagnosis not present

## 2020-05-14 DIAGNOSIS — I501 Left ventricular failure: Secondary | ICD-10-CM | POA: Diagnosis not present

## 2020-05-14 DIAGNOSIS — K4090 Unilateral inguinal hernia, without obstruction or gangrene, not specified as recurrent: Secondary | ICD-10-CM | POA: Diagnosis not present

## 2020-05-14 DIAGNOSIS — I5032 Chronic diastolic (congestive) heart failure: Secondary | ICD-10-CM | POA: Diagnosis not present

## 2020-05-14 DIAGNOSIS — I132 Hypertensive heart and chronic kidney disease with heart failure and with stage 5 chronic kidney disease, or end stage renal disease: Secondary | ICD-10-CM | POA: Diagnosis not present

## 2020-05-20 DIAGNOSIS — I501 Left ventricular failure: Secondary | ICD-10-CM | POA: Diagnosis not present

## 2020-05-20 DIAGNOSIS — E1122 Type 2 diabetes mellitus with diabetic chronic kidney disease: Secondary | ICD-10-CM | POA: Diagnosis not present

## 2020-05-20 DIAGNOSIS — N186 End stage renal disease: Secondary | ICD-10-CM | POA: Diagnosis not present

## 2020-05-20 DIAGNOSIS — I132 Hypertensive heart and chronic kidney disease with heart failure and with stage 5 chronic kidney disease, or end stage renal disease: Secondary | ICD-10-CM | POA: Diagnosis not present

## 2020-05-20 DIAGNOSIS — I5032 Chronic diastolic (congestive) heart failure: Secondary | ICD-10-CM | POA: Diagnosis not present

## 2020-05-20 DIAGNOSIS — N5089 Other specified disorders of the male genital organs: Secondary | ICD-10-CM | POA: Diagnosis not present

## 2020-05-20 DIAGNOSIS — R001 Bradycardia, unspecified: Secondary | ICD-10-CM | POA: Diagnosis not present

## 2020-05-20 DIAGNOSIS — E1165 Type 2 diabetes mellitus with hyperglycemia: Secondary | ICD-10-CM | POA: Diagnosis not present

## 2020-05-20 DIAGNOSIS — K4090 Unilateral inguinal hernia, without obstruction or gangrene, not specified as recurrent: Secondary | ICD-10-CM | POA: Diagnosis not present

## 2020-05-21 DIAGNOSIS — E1165 Type 2 diabetes mellitus with hyperglycemia: Secondary | ICD-10-CM | POA: Diagnosis not present

## 2020-05-21 DIAGNOSIS — I132 Hypertensive heart and chronic kidney disease with heart failure and with stage 5 chronic kidney disease, or end stage renal disease: Secondary | ICD-10-CM | POA: Diagnosis not present

## 2020-05-21 DIAGNOSIS — N186 End stage renal disease: Secondary | ICD-10-CM | POA: Diagnosis not present

## 2020-05-21 DIAGNOSIS — R001 Bradycardia, unspecified: Secondary | ICD-10-CM | POA: Diagnosis not present

## 2020-05-21 DIAGNOSIS — I5032 Chronic diastolic (congestive) heart failure: Secondary | ICD-10-CM | POA: Diagnosis not present

## 2020-05-21 DIAGNOSIS — N5089 Other specified disorders of the male genital organs: Secondary | ICD-10-CM | POA: Diagnosis not present

## 2020-05-21 DIAGNOSIS — K4090 Unilateral inguinal hernia, without obstruction or gangrene, not specified as recurrent: Secondary | ICD-10-CM | POA: Diagnosis not present

## 2020-05-21 DIAGNOSIS — I501 Left ventricular failure: Secondary | ICD-10-CM | POA: Diagnosis not present

## 2020-05-21 DIAGNOSIS — E1122 Type 2 diabetes mellitus with diabetic chronic kidney disease: Secondary | ICD-10-CM | POA: Diagnosis not present

## 2020-05-24 DIAGNOSIS — K4090 Unilateral inguinal hernia, without obstruction or gangrene, not specified as recurrent: Secondary | ICD-10-CM | POA: Diagnosis not present

## 2020-05-24 DIAGNOSIS — E1165 Type 2 diabetes mellitus with hyperglycemia: Secondary | ICD-10-CM | POA: Diagnosis not present

## 2020-05-24 DIAGNOSIS — I5032 Chronic diastolic (congestive) heart failure: Secondary | ICD-10-CM | POA: Diagnosis not present

## 2020-05-24 DIAGNOSIS — N5089 Other specified disorders of the male genital organs: Secondary | ICD-10-CM | POA: Diagnosis not present

## 2020-05-24 DIAGNOSIS — E1122 Type 2 diabetes mellitus with diabetic chronic kidney disease: Secondary | ICD-10-CM | POA: Diagnosis not present

## 2020-05-24 DIAGNOSIS — N186 End stage renal disease: Secondary | ICD-10-CM | POA: Diagnosis not present

## 2020-05-24 DIAGNOSIS — I132 Hypertensive heart and chronic kidney disease with heart failure and with stage 5 chronic kidney disease, or end stage renal disease: Secondary | ICD-10-CM | POA: Diagnosis not present

## 2020-05-24 DIAGNOSIS — I501 Left ventricular failure: Secondary | ICD-10-CM | POA: Diagnosis not present

## 2020-05-24 DIAGNOSIS — R001 Bradycardia, unspecified: Secondary | ICD-10-CM | POA: Diagnosis not present

## 2020-05-28 ENCOUNTER — Other Ambulatory Visit: Payer: Self-pay

## 2020-05-28 ENCOUNTER — Ambulatory Visit (HOSPITAL_COMMUNITY)
Admission: RE | Admit: 2020-05-28 | Discharge: 2020-05-28 | Disposition: A | Discharge status: Home / Self Care | Payer: Medicare HMO | Source: Ambulatory Visit | Attending: Nephrology | Admitting: Nephrology

## 2020-05-28 VITALS — BP 146/78 | HR 75 | Temp 97.3°F | Resp 20

## 2020-05-28 DIAGNOSIS — N189 Chronic kidney disease, unspecified: Secondary | ICD-10-CM | POA: Insufficient documentation

## 2020-05-28 DIAGNOSIS — N5089 Other specified disorders of the male genital organs: Secondary | ICD-10-CM | POA: Diagnosis not present

## 2020-05-28 DIAGNOSIS — E1165 Type 2 diabetes mellitus with hyperglycemia: Secondary | ICD-10-CM | POA: Diagnosis not present

## 2020-05-28 DIAGNOSIS — Z862 Personal history of diseases of the blood and blood-forming organs and certain disorders involving the immune mechanism: Secondary | ICD-10-CM | POA: Insufficient documentation

## 2020-05-28 DIAGNOSIS — I5032 Chronic diastolic (congestive) heart failure: Secondary | ICD-10-CM | POA: Diagnosis not present

## 2020-05-28 DIAGNOSIS — I132 Hypertensive heart and chronic kidney disease with heart failure and with stage 5 chronic kidney disease, or end stage renal disease: Secondary | ICD-10-CM | POA: Diagnosis not present

## 2020-05-28 DIAGNOSIS — K4090 Unilateral inguinal hernia, without obstruction or gangrene, not specified as recurrent: Secondary | ICD-10-CM | POA: Diagnosis not present

## 2020-05-28 DIAGNOSIS — I501 Left ventricular failure: Secondary | ICD-10-CM | POA: Diagnosis not present

## 2020-05-28 DIAGNOSIS — R001 Bradycardia, unspecified: Secondary | ICD-10-CM | POA: Diagnosis not present

## 2020-05-28 DIAGNOSIS — N186 End stage renal disease: Secondary | ICD-10-CM | POA: Diagnosis not present

## 2020-05-28 DIAGNOSIS — E1122 Type 2 diabetes mellitus with diabetic chronic kidney disease: Secondary | ICD-10-CM | POA: Diagnosis not present

## 2020-05-28 LAB — RENAL FUNCTION PANEL
Albumin: 3.3 g/dL — ABNORMAL LOW (ref 3.5–5.0)
Anion gap: 13 (ref 5–15)
BUN: 72 mg/dL — ABNORMAL HIGH (ref 8–23)
CO2: 26 mmol/L (ref 22–32)
Calcium: 9.6 mg/dL (ref 8.9–10.3)
Chloride: 100 mmol/L (ref 98–111)
Creatinine, Ser: 4.16 mg/dL — ABNORMAL HIGH (ref 0.61–1.24)
GFR calc Af Amer: 15 mL/min — ABNORMAL LOW (ref >60)
GFR calc non Af Amer: 13 mL/min — ABNORMAL LOW (ref >60)
Glucose, Bld: 149 mg/dL — ABNORMAL HIGH (ref 70–99)
Phosphorus: 5.1 mg/dL — ABNORMAL HIGH (ref 2.5–4.6)
Potassium: 3.5 mmol/L (ref 3.5–5.1)
Sodium: 139 mmol/L (ref 135–145)

## 2020-05-28 LAB — FERRITIN: Ferritin: 401 ng/mL — ABNORMAL HIGH (ref 24–336)

## 2020-05-28 LAB — IRON AND TIBC
Iron: 69 ug/dL (ref 45–182)
Saturation Ratios: 26 % (ref 17.9–39.5)
TIBC: 265 ug/dL (ref 250–450)
UIBC: 196 ug/dL

## 2020-05-28 LAB — POCT HEMOGLOBIN-HEMACUE: Hemoglobin: 10.5 g/dL — ABNORMAL LOW (ref 13.0–17.0)

## 2020-05-28 MED ORDER — EPOETIN ALFA-EPBX 10000 UNIT/ML IJ SOLN
INTRAMUSCULAR | Status: AC
  Administered 2020-05-28: 30000 [IU]
  Filled 2020-05-28: qty 3

## 2020-05-28 MED ORDER — EPOETIN ALFA-EPBX 10000 UNIT/ML IJ SOLN: 30000.0000 [IU] | INTRAMUSCULAR | Status: DC

## 2020-05-29 LAB — PTH, INTACT AND CALCIUM
Calcium, Total (PTH): 9.3 mg/dL (ref 8.6–10.2)
PTH: 63 pg/mL (ref 15–65)

## 2020-05-31 DIAGNOSIS — N5089 Other specified disorders of the male genital organs: Secondary | ICD-10-CM | POA: Diagnosis not present

## 2020-05-31 DIAGNOSIS — I501 Left ventricular failure: Secondary | ICD-10-CM | POA: Diagnosis not present

## 2020-05-31 DIAGNOSIS — K4090 Unilateral inguinal hernia, without obstruction or gangrene, not specified as recurrent: Secondary | ICD-10-CM | POA: Diagnosis not present

## 2020-05-31 DIAGNOSIS — I5032 Chronic diastolic (congestive) heart failure: Secondary | ICD-10-CM | POA: Diagnosis not present

## 2020-05-31 DIAGNOSIS — N186 End stage renal disease: Secondary | ICD-10-CM | POA: Diagnosis not present

## 2020-05-31 DIAGNOSIS — I132 Hypertensive heart and chronic kidney disease with heart failure and with stage 5 chronic kidney disease, or end stage renal disease: Secondary | ICD-10-CM | POA: Diagnosis not present

## 2020-05-31 DIAGNOSIS — R001 Bradycardia, unspecified: Secondary | ICD-10-CM | POA: Diagnosis not present

## 2020-05-31 DIAGNOSIS — E1122 Type 2 diabetes mellitus with diabetic chronic kidney disease: Secondary | ICD-10-CM | POA: Diagnosis not present

## 2020-05-31 DIAGNOSIS — E1165 Type 2 diabetes mellitus with hyperglycemia: Secondary | ICD-10-CM | POA: Diagnosis not present

## 2020-06-08 DIAGNOSIS — I5032 Chronic diastolic (congestive) heart failure: Secondary | ICD-10-CM | POA: Diagnosis not present

## 2020-06-08 DIAGNOSIS — E1122 Type 2 diabetes mellitus with diabetic chronic kidney disease: Secondary | ICD-10-CM | POA: Diagnosis not present

## 2020-06-08 DIAGNOSIS — N5089 Other specified disorders of the male genital organs: Secondary | ICD-10-CM | POA: Diagnosis not present

## 2020-06-08 DIAGNOSIS — R001 Bradycardia, unspecified: Secondary | ICD-10-CM | POA: Diagnosis not present

## 2020-06-08 DIAGNOSIS — E1165 Type 2 diabetes mellitus with hyperglycemia: Secondary | ICD-10-CM | POA: Diagnosis not present

## 2020-06-08 DIAGNOSIS — K4090 Unilateral inguinal hernia, without obstruction or gangrene, not specified as recurrent: Secondary | ICD-10-CM | POA: Diagnosis not present

## 2020-06-08 DIAGNOSIS — N186 End stage renal disease: Secondary | ICD-10-CM | POA: Diagnosis not present

## 2020-06-08 DIAGNOSIS — I132 Hypertensive heart and chronic kidney disease with heart failure and with stage 5 chronic kidney disease, or end stage renal disease: Secondary | ICD-10-CM | POA: Diagnosis not present

## 2020-06-08 DIAGNOSIS — I501 Left ventricular failure: Secondary | ICD-10-CM | POA: Diagnosis not present

## 2020-06-15 DIAGNOSIS — I770 Arteriovenous fistula, acquired: Secondary | ICD-10-CM | POA: Diagnosis not present

## 2020-06-15 DIAGNOSIS — N2581 Secondary hyperparathyroidism of renal origin: Secondary | ICD-10-CM | POA: Diagnosis not present

## 2020-06-15 DIAGNOSIS — I120 Hypertensive chronic kidney disease with stage 5 chronic kidney disease or end stage renal disease: Secondary | ICD-10-CM | POA: Diagnosis not present

## 2020-06-15 DIAGNOSIS — N185 Chronic kidney disease, stage 5: Secondary | ICD-10-CM | POA: Diagnosis not present

## 2020-06-15 DIAGNOSIS — D631 Anemia in chronic kidney disease: Secondary | ICD-10-CM | POA: Diagnosis not present

## 2020-06-15 DIAGNOSIS — Q600 Renal agenesis, unilateral: Secondary | ICD-10-CM | POA: Diagnosis not present

## 2020-06-15 DIAGNOSIS — R609 Edema, unspecified: Secondary | ICD-10-CM | POA: Diagnosis not present

## 2020-06-15 DIAGNOSIS — M109 Gout, unspecified: Secondary | ICD-10-CM | POA: Diagnosis not present

## 2020-06-16 ENCOUNTER — Other Ambulatory Visit: Payer: Self-pay

## 2020-06-16 ENCOUNTER — Encounter (HOSPITAL_COMMUNITY)
Admission: RE | Admit: 2020-06-16 | Discharge: 2020-06-16 | Disposition: A | Discharge status: Home / Self Care | Payer: Medicare HMO | Source: Ambulatory Visit | Attending: Nephrology | Admitting: Nephrology

## 2020-06-16 VITALS — BP 144/65 | HR 70 | Temp 97.4°F | Resp 20

## 2020-06-16 DIAGNOSIS — Z862 Personal history of diseases of the blood and blood-forming organs and certain disorders involving the immune mechanism: Secondary | ICD-10-CM | POA: Insufficient documentation

## 2020-06-16 DIAGNOSIS — N189 Chronic kidney disease, unspecified: Secondary | ICD-10-CM | POA: Diagnosis not present

## 2020-06-16 LAB — POCT HEMOGLOBIN-HEMACUE: Hemoglobin: 10.9 g/dL — ABNORMAL LOW (ref 13.0–17.0)

## 2020-06-16 MED ORDER — EPOETIN ALFA-EPBX 10000 UNIT/ML IJ SOLN
INTRAMUSCULAR | Status: AC
  Filled 2020-06-16: qty 3

## 2020-06-16 MED ORDER — EPOETIN ALFA-EPBX 10000 UNIT/ML IJ SOLN
30000.0000 [IU] | INTRAMUSCULAR | Status: DC
  Administered 2020-06-16: 30000 [IU] via SUBCUTANEOUS

## 2020-06-17 ENCOUNTER — Other Ambulatory Visit: Payer: Self-pay | Admitting: Surgery

## 2020-06-17 DIAGNOSIS — K4030 Unilateral inguinal hernia, with obstruction, without gangrene, not specified as recurrent: Secondary | ICD-10-CM | POA: Diagnosis not present

## 2020-06-18 ENCOUNTER — Encounter (HOSPITAL_COMMUNITY): Payer: Medicare HMO

## 2020-06-18 DIAGNOSIS — I132 Hypertensive heart and chronic kidney disease with heart failure and with stage 5 chronic kidney disease, or end stage renal disease: Secondary | ICD-10-CM | POA: Diagnosis not present

## 2020-06-18 DIAGNOSIS — E1122 Type 2 diabetes mellitus with diabetic chronic kidney disease: Secondary | ICD-10-CM | POA: Diagnosis not present

## 2020-06-18 DIAGNOSIS — E1165 Type 2 diabetes mellitus with hyperglycemia: Secondary | ICD-10-CM | POA: Diagnosis not present

## 2020-06-18 DIAGNOSIS — K4090 Unilateral inguinal hernia, without obstruction or gangrene, not specified as recurrent: Secondary | ICD-10-CM | POA: Diagnosis not present

## 2020-06-18 DIAGNOSIS — I501 Left ventricular failure: Secondary | ICD-10-CM | POA: Diagnosis not present

## 2020-06-18 DIAGNOSIS — N5089 Other specified disorders of the male genital organs: Secondary | ICD-10-CM | POA: Diagnosis not present

## 2020-06-18 DIAGNOSIS — R001 Bradycardia, unspecified: Secondary | ICD-10-CM | POA: Diagnosis not present

## 2020-06-18 DIAGNOSIS — I5032 Chronic diastolic (congestive) heart failure: Secondary | ICD-10-CM | POA: Diagnosis not present

## 2020-06-18 DIAGNOSIS — N186 End stage renal disease: Secondary | ICD-10-CM | POA: Diagnosis not present

## 2020-06-21 ENCOUNTER — Telehealth: Payer: Self-pay

## 2020-06-21 NOTE — Telephone Encounter (Signed)
   [facility] Pre-operative Risk Assessment    HEARTCARE STAFF: - Please ensure there is not already an duplicate clearance open for this procedure. - Under Visit Info/Reason for Call, type in Other and utilize the format Clearance MM/DD/YY or Clearance TBD. Do not use dashes or single digits. - If request is for dental extraction, please clarify the # of teeth to be extracted.  Request for surgical clearance:  1. What type of surgery is being performed? Hernia repair   2. When is this surgery scheduled? tbd  3. What type of clearance is required (medical clearance vs. Pharmacy clearance to hold med vs. Both)? both  4. Are there any medications that need to be held prior to surgery and how long? Not listed  5. Practice name and name of physician performing surgery? [facility]- Coralie Keens MD  6. What is the office phone number? [phone]   7.   What is the office fax number? [phone]  8.   Anesthesia type (None, local, MAC, general) ? general   David Nguyen 06/21/2020, 10:01 AM  _________________________________________________________________   (provider comments below)

## 2020-06-22 NOTE — Telephone Encounter (Signed)
° °  Primary Cardiologist:Jonathan [name], MD  Chart reviewed as part of pre-operative protocol coverage. Because of David Nguyen's past medical history and time since last visit, they will require a follow-up visit in order to better assess preoperative cardiovascular risk.  Pre-op covering staff: - Please schedule appointment and call patient to inform them. If patient already had an upcoming appointment within acceptable timeframe, please add "pre-op clearance" to the appointment notes so provider is aware. - Please contact requesting surgeon's office via preferred method (i.e, phone, fax) to inform them of need for appointment prior to surgery.  If applicable, this message will also be routed to pharmacy pool and/or primary cardiologist for input on holding anticoagulant/antiplatelet agent as requested below so that this information is available to the clearing provider at time of patient's appointment.   [place], [place]  06/22/2020, 3:34 PM

## 2020-06-23 ENCOUNTER — Ambulatory Visit: Payer: Medicare HMO | Admitting: Cardiovascular Disease

## 2020-06-23 NOTE — Telephone Encounter (Signed)
Pt has been scheduled to see Coletta Memos, NP, 06/25/20.  Will route back to requesting surgeon's office to make them aware.

## 2020-06-24 NOTE — Progress Notes (Signed)
Cardiology Clinic Note   Patient Name: David Nguyen Date of Encounter: 06/25/2020  Primary Care Provider:  [name], MD Primary Cardiologist:  Quay Burow, MD  Patient Profile    David Nguyen 79 year old male presents today for preoperative cardiac evaluation and to follow-up of his essential hypertension, and chronic diastolic CHF.  Past Medical History    Past Medical History:  Diagnosis Date   Allergic rhinitis    Anemia    low iron   Bradycardia 03/2019   Chronic kidney disease    Followed by Dr Deterding     Diabetes mellitus    Type II   Horseshoe kidney    Hyperlipidemia    Hypertension    Hypothyroidism    Refusal of blood transfusions as patient is Jehovah's Witness    Scrotal edema    Sleep apnea    uses a cpap   Past Surgical History:  Procedure Laterality Date   A/V FISTULAGRAM Left 03/05/2020   Procedure: A/V FISTULAGRAM - Left Arm;  Surgeon: Angelia Mould, MD;  Location: [place] [facility];  Service: Cardiovascular;  Laterality: Left;   ADRENALECTOMY  1993   left   AV FISTULA PLACEMENT Left 05/06/2018   Procedure: ARTERIOVENOUS (AV) FISTULA CREATION RADIOCEPHALIC;  Surgeon: [name], MD;  Location: [facility];  Service: Vascular;  Laterality: Left;   COLONOSCOPY W/ POLYPECTOMY     REVISION OF ARTERIOVENOUS GORETEX GRAFT Left 07/11/2018   Procedure: TRANSPOSITION OF RADIOCEPHALIC  ARTERIOVENOUS FISTULA LEFT ARM;  Surgeon: [name], MD;  Location: [facility];  Service: Vascular;  Laterality: Left;   REVISION OF ARTERIOVENOUS GORETEX GRAFT Left 03/12/2020   Procedure: REVISION OF LEFT RADIOCEPHALIC FISTULA;  Surgeon: [name], MD;  Location: [place];  Service: Vascular;  Laterality: Left;    Allergies  Allergies  Allergen Reactions   Penicillins Rash and Other (See Comments)    Childhood allergy       History of Present Illness    Mr. Weekly has a PMH of essential hypertension chronic diastolic CHF,  pulmonary hypertension, obesity, sleep apnea, CKD stage IV, diabetes mellitus type 2, hypothyroidism, hyperlipidemia, and symptomatic bradycardia.  He was admitted to the hospital 03/31/2019 with bradycardia discharged 5 days later.  During his hospitalization his echocardiogram showed normal LV function, diastolic dysfunction and moderate-severe pulmonary hypertension.  He has stage III CKD with serum creatinine in the low 3 range.  He is not on HD.  His last seen by Dr. [name] on 12/30/2019.  During that time he continued to do well.  He denied further episodes of symptomatic bradycardia.  His creatinine was in the mid 3 range and he continued to defer HD.  He denied chest pain, and shortness of breath.  He had lower extremity arterial Dopplers which did not show significant obstructive disease.  He presents to the clinic today for preoperative cardiac evaluation and states he is limited in his physical activity due to left leg pain.  He presents with his cane today.  He states that he feels he would be able to walk 2 city blocks and up 2 flights of stairs if he did not have leg pain.  He has had no further episodes of slow heart rate or any palpitations.  He follows a heart healthy low-sodium diet.  I will send his preop cardiac evaluation to [facility] [place] [facility] and have him follow-up with Dr. [name] in 6 months.  Today he denies chest pain, shortness of  breath, lower extremity edema, fatigue, palpitations, melena, hematuria, hemoptysis, diaphoresis, weakness, presyncope, syncope, orthopnea, and PND.   Home Medications    Prior to Admission medications   Medication Sig Start Date End Date Taking? Authorizing Provider  ACCU-CHEK GUIDE test strip  12/06/19   [provider]  acetaminophen (TYLENOL) 650 MG CR tablet Take 650 mg by mouth in the morning, at noon, and at bedtime.    [provider]  allopurinol (ZYLOPRIM) 100 MG tablet Take 200 mg by mouth daily.  12/07/15   [provider]  amLODipine (NORVASC) 2.5 MG tablet Take 1 tablet (2.5 mg total) by mouth daily. 04/22/20 04/22/21  Georgette Shell, MD  aspirin EC 81 MG tablet Take 81 mg by mouth daily.    [provider]  atorvastatin (LIPITOR) 40 MG tablet Take 40 mg by mouth daily in the afternoon.     [provider]  calcitRIOL (ROCALTROL) 0.25 MCG capsule Take 0.25 mcg by mouth daily.  12/07/15   [provider]  carvedilol (COREG) 3.125 MG tablet Take 1 tablet (3.125 mg total) by mouth 2 (two) times daily with a meal. 04/05/19   Allie Bossier, MD  doxazosin (CARDURA) 8 MG tablet Take 8 mg by mouth daily in the afternoon.     [provider]  DROPLET PEN NEEDLES 31G X 8 MM MISC  11/18/19   [provider]  fluticasone (FLONASE) 50 MCG/ACT nasal spray Place 2 sprays into both nostrils daily.  03/31/18   [provider]  furosemide (LASIX) 80 MG tablet Take 1 tablet (80 mg total) by mouth 2 (two) times daily. 04/22/20 04/22/21  Georgette Shell, MD  Homeopathic Products [facility] ALLERGY EYE RELIEF) SOLN Place 1-2 drops into both eyes daily as needed (for dry eyes).    [provider]  insulin NPH-regular Human (70-30) 100 UNIT/ML injection Inject 16-18 Units into the skin See admin instructions. Inject 18 units in the morning and 16 units at night    [provider]  levothyroxine (SYNTHROID, LEVOTHROID) 75 MCG tablet Take 75 mcg by mouth daily before breakfast.    [provider]  loratadine (CLARITIN) 10 MG tablet Take 10 mg by mouth daily.    [provider]  Multiple Vitamin (MULTIVITAMIN WITH MINERALS) TABS tablet Take 1 tablet by mouth daily.    [provider]  PRESCRIPTION MEDICATION Inhale into the lungs at bedtime. CPAP    [provider]    Family History    Family History  Problem Relation Age of Onset   Prostate cancer Father    Kidney failure Mother    Lung cancer Sister     He indicated that his mother is deceased. He indicated that his father is deceased. He indicated that the status of his sister is unknown.  Social History    Social History   Socioeconomic History   Marital status: Divorced    Spouse name: Not on file   Number of children: 5   Years of education: Not on file   Highest education level: Not on file  Occupational History   Occupation: retired    Comment: [employer]  Tobacco Use   Smoking status: Former Smoker    Packs/day: 0.30    Years: 3.00    Pack years: 0.90    Types: Cigarettes    Quit date: 10/09/1968    Years since quitting: 51.7   Smokeless tobacco: Never Used  Vaping Use   Vaping Use:  Never used  Substance and Sexual Activity   Alcohol use: No   Drug use: No   Sexual activity: Not on file  Other Topics Concern   Not on file  Social History Narrative   Not on file   Social Determinants of Health   Financial Resource Strain:    Difficulty of Paying Living Expenses: Not on file  Food Insecurity:    Worried About [place] in the Last Year: Not on file   Ran Out of Food in the Last Year: Not on file  Transportation Needs: No Transportation Needs   Lack of Transportation (Medical): No   Lack of Transportation (Non-Medical): No  Physical Activity:    Days of Exercise per Week: Not on file   Minutes of Exercise per Session: Not on file  Stress:    Feeling of Stress : Not on file  Social Connections:    Frequency of Communication with Friends and Family: Not on file   Frequency of Social Gatherings with Friends and Family: Not on file   Attends Religious Services: Not on file   Active Member of Clubs or Organizations: Not on file   Attends Archivist Meetings: Not on file   Marital Status: Not on file  Intimate Partner Violence:    Fear of Current or Ex-Partner: Not on file   Emotionally Abused: Not on file   Physically Abused: Not on file   Sexually  Abused: Not on file     Review of Systems    General:  No chills, fever, night sweats or weight changes.  Cardiovascular:  No chest pain, dyspnea on exertion, edema, orthopnea, palpitations, paroxysmal nocturnal dyspnea. Dermatological: No rash, lesions/masses Respiratory: No cough, dyspnea Urologic: No hematuria, dysuria Abdominal:   No nausea, vomiting, diarrhea, bright red blood per rectum, melena, or hematemesis Neurologic:  No visual changes, wkns, changes in mental status. All other systems reviewed and are otherwise negative except as noted above.  Physical Exam    VS:  BP (!) 148/70    Pulse 73    Ht 6' (1.829 m)    Wt 265 lb (120.2 kg)    BMI 35.94 kg/m  , BMI Body mass index is 35.94 kg/m. GEN: Well nourished, well developed, in no acute distress. HEENT: normal. Neck: Supple, no JVD, carotid bruits, or masses. Cardiac: RRR, no murmurs, rubs, or gallops. No clubbing, cyanosis, bilateral lower extremity generalized nonpitting edema.  Radials/DP/PT 2+ and equal bilaterally.  Respiratory:  Respirations regular and unlabored, clear to auscultation bilaterally. GI: Soft, nontender, nondistended, BS + x 4. MS: no deformity or atrophy. Skin: warm and dry, no rash. Neuro:  Strength and sensation are intact. Psych: Normal affect.  Accessory Clinical Findings    Recent Labs: 04/20/2020: TSH 4.985 04/22/2020: Platelets 212 05/28/2020: BUN 72; Creatinine, Ser 4.16; Potassium 3.5; Sodium 139 06/16/2020: Hemoglobin 10.9   Recent Lipid Panel    Component Value Date/Time   CHOL 103 04/03/2019 0637   TRIG 45 04/03/2019 0637   HDL 41 04/03/2019 0637   CHOLHDL 2.5 04/03/2019 0637   VLDL 9 04/03/2019 0637   LDLCALC 53 04/03/2019 0637    ECG personally reviewed by me today-normal sinus rhythm left axis deviation right bundle branch block possible inferior infarct undetermined age 40 bpm- No acute changes  Echocardiogram 04/01/2019 IMPRESSIONS    1. The left ventricle has  hyperdynamic systolic function, with an  ejection fraction of >65%. The cavity size was decreased. There is  moderate  concentric left ventricular hypertrophy. Left ventricular  diastolic Doppler parameters are consistent with  restrictive filling. Elevated left atrial and left ventricular  end-diastolic pressures.  2. The right ventricle has normal systolic function. The cavity was  normal. There is no increase in right ventricular wall thickness. Right  ventricular systolic pressure moderate to severely elevated.  3. Left atrial size was severely dilated.  4. Right atrial size was moderately dilated.  5. Small pericardial effusion.  6. The pericardial effusion is posterior to the left ventricle.  7. Mild thickening of the mitral valve leaflet. There is moderate mitral  annular calcification present. No evidence of mitral valve stenosis.  8. The aortic valve is tricuspid. Mild calcification of the aortic valve.  Aortic valve regurgitation is trivial by color flow Doppler. Mild stenosis  of the aortic valve.  9. The aortic arch and aortic root are normal in size and structure.  10. The inferior vena cava was dilated in size with <50% respiratory  variability.  11. There is right bowing of the interatrial septum, suggestive of  elevated left atrial pressure.   Assessment & Plan   1.  Essential hypertension-BP today 148/70.  Well-controlled at home 130s over 70s.  Had not yet taken his blood pressure medication this morning. Continue carvedilol, lisinopril, amlodipine, doxazosin Heart healthy low-sodium diet-salty 6 given Increase physical activity as tolerated  Hyperlipidemia-LDL 75 on 06/19/2019. Continue statin Heart healthy low-sodium high-fiber diet Increase physical activity as tolerated  Bradycardia-EKG today shows normal sinus rhythm left axis deviation right bundle branch block inferior infarct undetermined age 72 bpm.  History of symptomatic bradycardia.  Has had no  recurrence and continues with low-dose carvedilol. Continue carvedilol, Heart healthy low-sodium diet-salty 6 given Increase physical activity as tolerated Continue to monitor   Preoperative cardiac evaluation-hernia repair [facility] [facility], Dr. Coralie Keens fax #[phone]   Primary Cardiologist: Quay Burow, MD  Chart reviewed as part of pre-operative protocol coverage. Given past medical history and time since last visit, based on ACC/AHA guidelines, JAYSTON TREVINO would be at acceptable risk for the planned procedure without further cardiovascular testing.   RCRI is a class IV risk, 11% risk of major cardiac event.  He is able to complete greater than 4 METS of physical activity.  His aspirin may be held for 7 days prior to his procedure.  Please resume as soon as hemostasis is achieved.  I will route this recommendation to the requesting party via Epic fax function and remove from pre-op pool.  Please call with questions.   Disposition: Follow-up with Dr. [name] or me in 6 months.   Jossie Ng. Melodye Swor NP-C    06/25/2020, 10:53 AM [address] Office [phone] Fax [phone]  Notice: This dictation was prepared with Dragon dictation along with smaller phrase technology. Any transcriptional errors that result from this process are unintentional and may not be corrected upon review.

## 2020-06-25 ENCOUNTER — Other Ambulatory Visit: Payer: Self-pay

## 2020-06-25 ENCOUNTER — Encounter: Payer: Self-pay | Admitting: General Practice

## 2020-06-25 ENCOUNTER — Ambulatory Visit: Payer: Medicare HMO | Admitting: General Practice

## 2020-06-25 VITALS — BP 148/70 | HR 73 | Ht 72.0 in | Wt 265.0 lb

## 2020-06-25 DIAGNOSIS — I10 Essential (primary) hypertension: Secondary | ICD-10-CM | POA: Diagnosis not present

## 2020-06-25 DIAGNOSIS — Z01810 Encounter for preprocedural cardiovascular examination: Secondary | ICD-10-CM

## 2020-06-25 DIAGNOSIS — E7800 Pure hypercholesterolemia, unspecified: Secondary | ICD-10-CM | POA: Diagnosis not present

## 2020-06-25 DIAGNOSIS — R001 Bradycardia, unspecified: Secondary | ICD-10-CM | POA: Diagnosis not present

## 2020-06-25 NOTE — Patient Instructions (Signed)
Medication Instructions:  The current medical regimen is effective;  continue present plan and medications as directed. Please refer to the Current Medication list given to you today. *If you need a refill on your cardiac medications before your next appointment, please call your pharmacy*  Lab Work:   Testing/Procedures:  NONE    NONE  Special Instructions PLEASE READ AND FOLLOW SALTY 6-ATTACHED  CLEARED  FOR UPCOMING SURGERY  Follow-Up: Your next appointment:  6 month(s) In Person with Quay Burow, MD -[place], FNP-C   At [facility], you and your health needs are our priority.  As part of our continuing mission to provide you with exceptional heart care, we have created designated Provider Care Teams.  These Care Teams include your primary Cardiologist (physician) and Advanced Practice Providers (APPs -  Physician Assistants and Nurse Practitioners) who all work together to provide you with the care you need, when you need it.  We recommend signing up for the patient portal called "MyChart".  Sign up information is provided on this After Visit Summary.  MyChart is used to connect with patients for Virtual Visits (Telemedicine).  Patients are able to view lab/test results, encounter notes, upcoming appointments, etc.  Non-urgent messages can be sent to your provider as well.   To learn more about what you can do with MyChart, go to [email or web address].

## 2020-07-02 ENCOUNTER — Encounter (HOSPITAL_COMMUNITY): Payer: Self-pay | Admitting: Surgery

## 2020-07-02 ENCOUNTER — Other Ambulatory Visit (HOSPITAL_COMMUNITY)
Admission: RE | Admit: 2020-07-02 | Discharge: 2020-07-02 | Disposition: A | Discharge status: Home / Self Care | Payer: Medicare HMO | Source: Ambulatory Visit | Attending: Surgery | Admitting: Surgery

## 2020-07-02 ENCOUNTER — Other Ambulatory Visit: Payer: Self-pay

## 2020-07-02 DIAGNOSIS — Z20822 Contact with and (suspected) exposure to covid-19: Secondary | ICD-10-CM | POA: Insufficient documentation

## 2020-07-02 DIAGNOSIS — Z01812 Encounter for preprocedural laboratory examination: Secondary | ICD-10-CM | POA: Diagnosis not present

## 2020-07-02 LAB — SARS CORONAVIRUS 2 (TAT 6-24 HRS): SARS Coronavirus 2: NEGATIVE

## 2020-07-02 NOTE — Progress Notes (Addendum)
Mr. Mauriello denies chest pain or shortness of breath. Patient will be tested for coid today.  Mr. Seamans has type II diabetes, patient reports that CBGs run a little under 100 and sometimes a little over 100. I instructed Mr. Shillingburg to take 11 units Sunday evening and no Insulin Monday AM. I instructed patient to check CBG after awaking and every 2 hours until arrival  to the hospital.  I Instructed patient if CBG is less than 70 to drink 1/2 cup of a clear juice. Recheck CBG in 15 minutes then call pre- op desk at [phone] for further instructions.   Mr. Mitzel said that the hernia is on the left side, consent is for the left side. I called Abigail Butts at Dr. Trevor Mace office and she checked the CT scan that was done in July, 2021,CT scan shows hernia is on the right side.  I called Mr. Gaddie back and informed him of the CT findings, patient said , "alright then."

## 2020-07-04 NOTE — H&P (Signed)
David Nguyen  Location: [facility] Patient #: [id number] DOB: [birth year] Married / Language: English / Race: Black or African American Male   History of Present Illness ( The patient is a 79 year old male who presents with an inguinal hernia.  Chief complaint: Right inguinal hernia  This is an 79 year old gentleman with multiple chronic medical problems including end-stage renal disease. He has an AV fistula but is not on dialysis yet. He has had a known right inguinal hernia for some time. He was seen in the hospital in July for bradycardia and had a CT scan showing a small ventral hernia, umbilical hernia, and a large right inguinal hernia containing chronically incarcerated fat with no evidence of bowel. He reports that the hernia is slowly getting larger. It causes minimal discomfort. He has no obstructive symptoms.   Diagnostic Studies History Colonoscopy  5-10 years ago  Allergies No Known Drug Allergies  [06/17/2020]: Allergies Reconciled   Medication History Darden Palmer, [place]; 06/17/2020 2:37 PM) traMADol HCl (50MG  Tablet, Oral) Active. amLODIPine Besylate (2.5MG  Tablet, Oral) Active. Furosemide (80MG  Tablet, Oral) Active. Atorvastatin Calcium (40MG  Tablet, Oral) Active. Doxazosin Mesylate (8MG  Tablet, Oral) Active. Allopurinol (100MG  Tablet, Oral) Active. Levothyroxine Sodium (75MCG Tablet, Oral) Active. Montelukast Sodium (10MG  Tablet, Oral) Active. Lisinopril (40MG  Tablet, Oral) Active. Carvedilol (3.125MG  Tablet, Oral) Active. Accu-Chek FastClix Lancets Active. Accu-Chek Guide (In Vitro) Active. Calcitriol (0.25MCG Capsule, Oral) Active. Fluticasone Propionate (50MCG/ACT Suspension, Nasal) Active. Medications Reconciled  Social History Darden Palmer, [place]; 06/17/2020 2:26 PM) Alcohol use  Remotely quit alcohol use. Caffeine use  Coffee. No drug use  Tobacco use  Former smoker.  Family History Darden Palmer, [place];  06/17/2020 2:26 PM) Diabetes Mellitus  Mother. Hypertension  Mother. Kidney Disease  Mother.  Other Problems Darden Palmer, [place]; 06/17/2020 2:26 PM) Congestive Heart Failure  Diabetes Mellitus     Review of Systems Darden Palmer RMA; 06/17/2020 2:26 PM) General Not Present- Appetite Loss, Chills, Fatigue, Fever, Night Sweats, Weight Gain and Weight Loss. Skin Not Present- Change in Wart/Mole, Dryness, Hives, Jaundice, New Lesions, Non-Healing Wounds, Rash and Ulcer. HEENT Present- Wears glasses/contact lenses. Not Present- Earache, Hearing Loss, Hoarseness, Nose Bleed, Oral Ulcers, Ringing in the Ears, Seasonal Allergies, Sinus Pain, Sore Throat, Visual Disturbances and Yellow Eyes. Respiratory Not Present- Bloody sputum, Chronic Cough, Difficulty Breathing, Snoring and Wheezing. Breast Not Present- Breast Mass, Breast Pain, Nipple Discharge and Skin Changes. Cardiovascular Not Present- Chest Pain, Difficulty Breathing Lying Down, Leg Cramps, Palpitations, Rapid Heart Rate, Shortness of Breath and Swelling of Extremities. Gastrointestinal Present- Indigestion. Not Present- Abdominal Pain, Bloating, Bloody Stool, Change in Bowel Habits, Chronic diarrhea, Constipation, Difficulty Swallowing, Excessive gas, Gets full quickly at meals, Hemorrhoids, Nausea, Rectal Pain and Vomiting. Male Genitourinary Not Present- Blood in Urine, Change in Urinary Stream, Frequency, Impotence, Nocturia, Painful Urination, Urgency and Urine Leakage. Musculoskeletal Not Present- Back Pain, Joint Pain, Joint Stiffness, Muscle Pain, Muscle Weakness and Swelling of Extremities. Neurological Present- Trouble walking. Not Present- Decreased Memory, Fainting, Headaches, Numbness, Seizures, Tingling, Tremor and Weakness. Psychiatric Not Present- Anxiety, Bipolar, Change in Sleep Pattern, Depression, Fearful and Frequent crying. Endocrine Present- Cold Intolerance. Not Present- Excessive Hunger, Hair Changes, Heat  Intolerance, Hot flashes and New Diabetes. Hematology Present- Blood Thinners. Not Present- Easy Bruising, Excessive bleeding, Gland problems, HIV and Persistent Infections.  Vitals Lattie Haw [place] RMA; 06/17/2020 2:38 PM) 06/17/2020 2:37 PM Weight: 156.13 lb Height: 70in Body Surface Area: 1.88 m Body Mass Index: 22.4 kg/m  Temp.: 98.74F  Pulse:  87 (Regular)  P.OX: 98% (Room air) BP: 136/70(Sitting, Left Arm, Standard)       Physical Exam (Deshon Hsiao A. Ninfa Linden MD; 06/17/2020 2:50 PM) The physical exam findings are as follows: Note: He is an elderly-appearing large gentleman walking with a cane  He has a large, nontender, chronically incarcerated right inguinal hernia going down into the scrotum. There is a small hernia at his umbilicus. There is also a small hernia in the epigastrium.    Assessment & Plan (Elyon Zoll A. Ninfa Linden MD; 06/17/2020 2:51 PM)  INCARCERATED RIGHT INGUINAL HERNIA (K40.30)  Impression: I reviewed his notes in the electronic medical records. I reviewed his CT scan. He indeed has a chronically incarcerated large right inguinal hernia. He does have small hernia defects at his umbilicus and epigastrium. He is only interested in getting the inguinal hernia fixed as this is the only symptomatic to him. I believe this is reasonable. I discussed the diagnosis of hernias with the patient and his wife. We discussed reasons for hernia repair. I discussed proceeding with an open right inguinal hernia repair with mesh. I discussed the risk which includes but is not limited to bleeding, infection, use of mesh, nerve entrapment, chronic pain, hernia recurrence, cardiopulmonary issues, etc. He will need cardiac clearance preoperatively. They understand and wish to proceed with surgery

## 2020-07-05 ENCOUNTER — Encounter (HOSPITAL_COMMUNITY): Payer: Self-pay | Admitting: Surgery

## 2020-07-05 ENCOUNTER — Ambulatory Visit (HOSPITAL_COMMUNITY): Payer: Medicare HMO

## 2020-07-05 ENCOUNTER — Encounter (HOSPITAL_COMMUNITY)
Admission: RE | Disposition: A | Discharge status: Home / Self Care | Payer: Self-pay | Source: Ambulatory Visit | Attending: Surgery

## 2020-07-05 ENCOUNTER — Other Ambulatory Visit: Payer: Self-pay

## 2020-07-05 ENCOUNTER — Ambulatory Visit (HOSPITAL_COMMUNITY)
Admission: RE | Admit: 2020-07-05 | Discharge: 2020-07-05 | Disposition: A | Discharge status: Home / Self Care | Payer: Medicare HMO | Source: Ambulatory Visit | Attending: Surgery | Admitting: Surgery

## 2020-07-05 DIAGNOSIS — Z79890 Hormone replacement therapy (postmenopausal): Secondary | ICD-10-CM | POA: Insufficient documentation

## 2020-07-05 DIAGNOSIS — K429 Umbilical hernia without obstruction or gangrene: Secondary | ICD-10-CM | POA: Insufficient documentation

## 2020-07-05 DIAGNOSIS — I509 Heart failure, unspecified: Secondary | ICD-10-CM | POA: Diagnosis not present

## 2020-07-05 DIAGNOSIS — Z833 Family history of diabetes mellitus: Secondary | ICD-10-CM | POA: Insufficient documentation

## 2020-07-05 DIAGNOSIS — I132 Hypertensive heart and chronic kidney disease with heart failure and with stage 5 chronic kidney disease, or end stage renal disease: Secondary | ICD-10-CM | POA: Diagnosis not present

## 2020-07-05 DIAGNOSIS — Z8249 Family history of ischemic heart disease and other diseases of the circulatory system: Secondary | ICD-10-CM | POA: Diagnosis not present

## 2020-07-05 DIAGNOSIS — N186 End stage renal disease: Secondary | ICD-10-CM | POA: Insufficient documentation

## 2020-07-05 DIAGNOSIS — K4090 Unilateral inguinal hernia, without obstruction or gangrene, not specified as recurrent: Secondary | ICD-10-CM | POA: Diagnosis not present

## 2020-07-05 DIAGNOSIS — E1122 Type 2 diabetes mellitus with diabetic chronic kidney disease: Secondary | ICD-10-CM | POA: Insufficient documentation

## 2020-07-05 DIAGNOSIS — G4730 Sleep apnea, unspecified: Secondary | ICD-10-CM | POA: Insufficient documentation

## 2020-07-05 DIAGNOSIS — Z79899 Other long term (current) drug therapy: Secondary | ICD-10-CM | POA: Diagnosis not present

## 2020-07-05 DIAGNOSIS — Z841 Family history of disorders of kidney and ureter: Secondary | ICD-10-CM | POA: Insufficient documentation

## 2020-07-05 DIAGNOSIS — K4030 Unilateral inguinal hernia, with obstruction, without gangrene, not specified as recurrent: Secondary | ICD-10-CM | POA: Diagnosis not present

## 2020-07-05 DIAGNOSIS — I130 Hypertensive heart and chronic kidney disease with heart failure and stage 1 through stage 4 chronic kidney disease, or unspecified chronic kidney disease: Secondary | ICD-10-CM | POA: Diagnosis not present

## 2020-07-05 DIAGNOSIS — N184 Chronic kidney disease, stage 4 (severe): Secondary | ICD-10-CM | POA: Diagnosis not present

## 2020-07-05 DIAGNOSIS — Z87891 Personal history of nicotine dependence: Secondary | ICD-10-CM | POA: Diagnosis not present

## 2020-07-05 DIAGNOSIS — I5032 Chronic diastolic (congestive) heart failure: Secondary | ICD-10-CM | POA: Diagnosis not present

## 2020-07-05 HISTORY — DX: Unspecified osteoarthritis, unspecified site: M19.90

## 2020-07-05 HISTORY — PX: INGUINAL HERNIA REPAIR: SHX194

## 2020-07-05 HISTORY — PX: INSERTION OF MESH: SHX5868

## 2020-07-05 LAB — POCT I-STAT, CHEM 8
BUN: 65 mg/dL — ABNORMAL HIGH (ref 8–23)
Calcium, Ion: 1.24 mmol/L (ref 1.15–1.40)
Chloride: 103 mmol/L (ref 98–111)
Creatinine, Ser: 4.7 mg/dL — ABNORMAL HIGH (ref 0.61–1.24)
Glucose, Bld: 103 mg/dL — ABNORMAL HIGH (ref 70–99)
HCT: 38 % — ABNORMAL LOW (ref 39.0–52.0)
Hemoglobin: 12.9 g/dL — ABNORMAL LOW (ref 13.0–17.0)
Potassium: 3.8 mmol/L (ref 3.5–5.1)
Sodium: 141 mmol/L (ref 135–145)
TCO2: 25 mmol/L (ref 22–32)

## 2020-07-05 LAB — GLUCOSE, CAPILLARY
Glucose-Capillary: 93 mg/dL (ref 70–99)
Glucose-Capillary: 170 mg/dL — ABNORMAL HIGH (ref 70–99)

## 2020-07-05 LAB — NO BLOOD PRODUCTS

## 2020-07-05 SURGERY — REPAIR, HERNIA, INGUINAL, ADULT
Anesthesia: General | Site: Inguinal | Laterality: Right

## 2020-07-05 MED ORDER — BUPIVACAINE HCL (PF) 0.25 % IJ SOLN
INTRAMUSCULAR | Status: DC | PRN
  Administered 2020-07-05: 20 mL

## 2020-07-05 MED ORDER — CHLORHEXIDINE GLUCONATE 0.12 % MT SOLN
15.0000 mL | Freq: Once | OROMUCOSAL | Status: AC
  Administered 2020-07-05: 15 mL via OROMUCOSAL
  Filled 2020-07-05: qty 15

## 2020-07-05 MED ORDER — GABAPENTIN 100 MG PO CAPS
100.0000 mg | ORAL_CAPSULE | ORAL | Status: AC
  Administered 2020-07-05: 100 mg via ORAL
  Filled 2020-07-05: qty 1

## 2020-07-05 MED ORDER — FENTANYL CITRATE (PF) 100 MCG/2ML IJ SOLN
INTRAMUSCULAR | Status: DC | PRN
Start: 2020-07-05 — End: 2020-07-05
  Administered 2020-07-05: 50 ug via INTRAVENOUS

## 2020-07-05 MED ORDER — ENSURE PRE-SURGERY PO LIQD: 296.0000 mL | Freq: Once | ORAL | Status: DC

## 2020-07-05 MED ORDER — FENTANYL CITRATE (PF) 100 MCG/2ML IJ SOLN: 50.0000 ug | Freq: Once | INTRAMUSCULAR | Status: AC

## 2020-07-05 MED ORDER — MIDAZOLAM HCL 2 MG/2ML IJ SOLN
INTRAMUSCULAR | Status: AC
  Administered 2020-07-05: 1 mg via INTRAVENOUS
  Filled 2020-07-05: qty 2

## 2020-07-05 MED ORDER — FENTANYL CITRATE (PF) 100 MCG/2ML IJ SOLN
INTRAMUSCULAR | Status: AC
  Administered 2020-07-05: 50 ug via INTRAVENOUS
  Filled 2020-07-05: qty 2

## 2020-07-05 MED ORDER — MEPERIDINE HCL 25 MG/ML IJ SOLN: 6.2500 mg | INTRAMUSCULAR | Status: DC | PRN

## 2020-07-05 MED ORDER — HYDROMORPHONE HCL 1 MG/ML IJ SOLN
INTRAMUSCULAR | Status: AC
  Filled 2020-07-05: qty 1

## 2020-07-05 MED ORDER — CHLORHEXIDINE GLUCONATE CLOTH 2 % EX PADS: 6.0000 | MEDICATED_PAD | Freq: Once | CUTANEOUS | Status: DC

## 2020-07-05 MED ORDER — BUPIVACAINE HCL (PF) 0.25 % IJ SOLN
INTRAMUSCULAR | Status: AC
  Filled 2020-07-05: qty 30

## 2020-07-05 MED ORDER — TRAMADOL HCL 50 MG PO TABS: 50.0000 mg | ORAL_TABLET | Freq: Four times a day (QID) | ORAL | 1 refills | Status: DC | PRN

## 2020-07-05 MED ORDER — ONDANSETRON HCL 4 MG/2ML IJ SOLN
INTRAMUSCULAR | Status: DC | PRN
  Administered 2020-07-05: 4 mg via INTRAVENOUS

## 2020-07-05 MED ORDER — 0.9 % SODIUM CHLORIDE (POUR BTL) OPTIME
TOPICAL | Status: DC | PRN
  Administered 2020-07-05: 1000 mL

## 2020-07-05 MED ORDER — ACETAMINOPHEN 500 MG PO TABS
1000.0000 mg | ORAL_TABLET | ORAL | Status: AC
  Administered 2020-07-05: 1000 mg via ORAL
  Filled 2020-07-05: qty 2

## 2020-07-05 MED ORDER — PROPOFOL 10 MG/ML IV BOLUS
INTRAVENOUS | Status: AC
  Filled 2020-07-05: qty 40

## 2020-07-05 MED ORDER — MIDAZOLAM HCL 2 MG/2ML IJ SOLN: 1.0000 mg | Freq: Once | INTRAMUSCULAR | Status: AC

## 2020-07-05 MED ORDER — HYDROMORPHONE HCL 1 MG/ML IJ SOLN
0.2500 mg | INTRAMUSCULAR | Status: DC | PRN
  Administered 2020-07-05: 0.25 mg via INTRAVENOUS

## 2020-07-05 MED ORDER — SODIUM CHLORIDE 0.9 % IV SOLN: INTRAVENOUS | Status: DC

## 2020-07-05 MED ORDER — PROPOFOL 10 MG/ML IV BOLUS
INTRAVENOUS | Status: DC | PRN
  Administered 2020-07-05: 200 mg via INTRAVENOUS

## 2020-07-05 MED ORDER — LIDOCAINE 2% (20 MG/ML) 5 ML SYRINGE
INTRAMUSCULAR | Status: DC | PRN
  Administered 2020-07-05: 100 mg via INTRAVENOUS

## 2020-07-05 MED ORDER — ORAL CARE MOUTH RINSE: 15.0000 mL | Freq: Once | OROMUCOSAL | Status: AC

## 2020-07-05 MED ORDER — FENTANYL CITRATE (PF) 250 MCG/5ML IJ SOLN
INTRAMUSCULAR | Status: AC
  Filled 2020-07-05: qty 5

## 2020-07-05 MED ORDER — ONDANSETRON HCL 4 MG/2ML IJ SOLN: 4.0000 mg | Freq: Once | INTRAMUSCULAR | Status: DC | PRN

## 2020-07-05 MED ORDER — CIPROFLOXACIN IN D5W 400 MG/200ML IV SOLN
400.0000 mg | INTRAVENOUS | Status: AC
  Administered 2020-07-05: 400 mg via INTRAVENOUS
  Filled 2020-07-05: qty 200

## 2020-07-05 MED ORDER — PROPOFOL 10 MG/ML IV BOLUS
INTRAVENOUS | Status: AC
  Filled 2020-07-05: qty 20

## 2020-07-05 MED ORDER — DEXAMETHASONE SODIUM PHOSPHATE 10 MG/ML IJ SOLN
INTRAMUSCULAR | Status: DC | PRN
  Administered 2020-07-05: 5 mg via INTRAVENOUS

## 2020-07-05 SURGICAL SUPPLY — 34 items
ADH SKN CLS APL DERMABOND .7 (GAUZE/BANDAGES/DRESSINGS) ×1
APL PRP STRL LF DISP 70% ISPRP (MISCELLANEOUS) ×1
BLADE CLIPPER SURG (BLADE) IMPLANT
CHLORAPREP W/TINT 26 (MISCELLANEOUS) ×2 IMPLANT
COVER SURGICAL LIGHT HANDLE (MISCELLANEOUS) ×2 IMPLANT
COVER WAND RF STERILE (DRAPES) IMPLANT
DERMABOND ADVANCED (GAUZE/BANDAGES/DRESSINGS) ×1
DERMABOND ADVANCED .7 DNX12 (GAUZE/BANDAGES/DRESSINGS) ×1 IMPLANT
DRAIN PENROSE 1/2X12 LTX STRL (WOUND CARE) ×2 IMPLANT
DRAPE LAPAROTOMY TRNSV 102X78 (DRAPES) ×2 IMPLANT
ELECT REM PT RETURN 9FT ADLT (ELECTROSURGICAL) ×2
ELECTRODE REM PT RTRN 9FT ADLT (ELECTROSURGICAL) ×1 IMPLANT
GLOVE SURG SIGNA 7.5 PF LTX (GLOVE) ×2 IMPLANT
GOWN STRL REUS W/ TWL LRG LVL3 (GOWN DISPOSABLE) ×1 IMPLANT
GOWN STRL REUS W/ TWL XL LVL3 (GOWN DISPOSABLE) ×1 IMPLANT
GOWN STRL REUS W/TWL LRG LVL3 (GOWN DISPOSABLE) ×2
GOWN STRL REUS W/TWL XL LVL3 (GOWN DISPOSABLE) ×2
KIT BASIN OR (CUSTOM PROCEDURE TRAY) ×2 IMPLANT
KIT TURNOVER KIT B (KITS) ×2 IMPLANT
MESH PARIETEX PROGRIP RIGHT (Mesh General) ×2 IMPLANT
NEEDLE HYPO 25GX1X1/2 BEV (NEEDLE) ×2 IMPLANT
NS IRRIG 1000ML POUR BTL (IV SOLUTION) ×2 IMPLANT
PACK GENERAL/GYN (CUSTOM PROCEDURE TRAY) ×2 IMPLANT
PAD ARMBOARD 7.5X6 YLW CONV (MISCELLANEOUS) ×2 IMPLANT
PENCIL SMOKE EVACUATOR (MISCELLANEOUS) ×2 IMPLANT
SUT MON AB 4-0 PC3 18 (SUTURE) ×2 IMPLANT
SUT SILK 2 0 SH (SUTURE) ×2 IMPLANT
SUT VIC AB 2-0 CT1 27 (SUTURE) ×2
SUT VIC AB 2-0 CT1 TAPERPNT 27 (SUTURE) ×1 IMPLANT
SUT VIC AB 3-0 CT1 27 (SUTURE) ×2
SUT VIC AB 3-0 CT1 TAPERPNT 27 (SUTURE) ×1 IMPLANT
SYR CONTROL 10ML LL (SYRINGE) ×2 IMPLANT
TOWEL GREEN STERILE (TOWEL DISPOSABLE) ×2 IMPLANT
TOWEL GREEN STERILE FF (TOWEL DISPOSABLE) ×2 IMPLANT

## 2020-07-05 NOTE — Interval H&P Note (Signed)
History and Physical Interval Note:no change in H and P  07/05/2020 8:07 AM  David Nguyen  has presented today for surgery, with the diagnosis of right inguinal hernia.  The various methods of treatment have been discussed with the patient and family. After consideration of risks, benefits and other options for treatment, the patient has consented to  Procedure(s) with comments: HERNIA REPAIR INGUINAL ADULT (Right) - LMA as a surgical intervention.  The patient's history has been reviewed, patient examined, no change in status, stable for surgery.  I have reviewed the patient's chart and labs.  Questions were answered to the patient's satisfaction.     Coralie Keens

## 2020-07-05 NOTE — Anesthesia Procedure Notes (Signed)
Procedure Name: LMA Insertion Date/Time: 07/05/2020 9:57 AM Performed by: Moshe Salisbury, CRNA Pre-anesthesia Checklist: Patient identified, Emergency Drugs available, Suction available and Patient being monitored Patient Re-evaluated:Patient Re-evaluated prior to induction Oxygen Delivery Method: Circle System Utilized Preoxygenation: Pre-oxygenation with 100% oxygen Induction Type: IV induction Ventilation: Mask ventilation without difficulty LMA: LMA inserted LMA Size: 5.0 Number of attempts: 1 Placement Confirmation: positive ETCO2 Tube secured with: Tape Dental Injury: Teeth and Oropharynx as per pre-operative assessment

## 2020-07-05 NOTE — Anesthesia Postprocedure Evaluation (Signed)
Anesthesia Post Note  Patient: David Nguyen  Procedure(s) Performed: HERNIA REPAIR INGUINAL ADULT (Right Inguinal)     Patient location during evaluation: PACU Anesthesia Type: General Level of consciousness: awake and alert Pain management: pain level controlled Vital Signs Assessment: post-procedure vital signs reviewed and stable Respiratory status: spontaneous breathing, nonlabored ventilation, respiratory function stable and patient connected to nasal cannula oxygen Cardiovascular status: blood pressure returned to baseline and stable Postop Assessment: no apparent nausea or vomiting Anesthetic complications: no   No complications documented.  Last Vitals:  Vitals:   07/05/20 1045 07/05/20 1100  BP: (!) 134/58 (!) 125/55  Pulse: 62 61  Resp: (!) 23 16  Temp: (!) 36.1 C   SpO2: 99% 94%    Last Pain:  Vitals:   07/05/20 1045  TempSrc:   PainSc: 10-Worst pain ever                 Zawadi Aplin DAVID

## 2020-07-05 NOTE — Discharge Instructions (Signed)
[facility] _______[facility]  UMBILICAL OR INGUINAL HERNIA REPAIR: POST OP INSTRUCTIONS  Always review your discharge instruction sheet given to you by the facility where your surgery was performed. IF YOU HAVE DISABILITY OR FAMILY LEAVE FORMS, YOU MUST BRING THEM TO THE OFFICE FOR PROCESSING.   DO NOT GIVE THEM TO YOUR DOCTOR.  1. A  prescription for pain medication may be given to you upon discharge.  Take your pain medication as prescribed, if needed.  If narcotic pain medicine is not needed, then you may take acetaminophen (Tylenol) or ibuprofen (Advil) as needed. 2. Take your usually prescribed medications unless otherwise directed. If you need a refill on your pain medication, please contact your pharmacy.  They will contact our office to request authorization. Prescriptions will not be filled after 5 pm or on week-ends. 3. You should follow a light diet the first 24 hours after arrival home, such as soup and crackers, etc.  Be sure to include lots of fluids daily.  Resume your normal diet the day after surgery. 4.Most patients will experience some swelling and bruising around the umbilicus or in the groin and scrotum.  Ice packs and reclining will help.  Swelling and bruising can take several days to resolve.  6. It is common to experience some constipation if taking pain medication after surgery.  Increasing fluid intake and taking a stool softener (such as Colace) will usually help or prevent this problem from occurring.  A mild laxative (Milk of Magnesia or Miralax) should be taken according to package directions if there are no bowel movements after 48 hours. 7. Unless discharge instructions indicate otherwise, you may remove your bandages 24-48 hours after surgery, and you may shower at that time.  You may have steri-strips (small skin tapes) in place directly over the incision.  These strips should be left on the skin for 7-10 days.  If your surgeon used skin glue on the  incision, you may shower in 24 hours.  The glue will flake off over the next 2-3 weeks.  Any sutures or staples will be removed at the office during your follow-up visit. 8. ACTIVITIES:  You may resume regular (light) daily activities beginning the next day--such as daily self-care, walking, climbing stairs--gradually increasing activities as tolerated.  You may have sexual intercourse when it is comfortable.  Refrain from any heavy lifting or straining until approved by your doctor.  a.You may drive when you are no longer taking prescription pain medication, you can comfortably wear a seatbelt, and you can safely maneuver your car and apply brakes. b.RETURN TO WORK:   _____________________________________________  9.You should see your doctor in the office for a follow-up appointment approximately 2-3 weeks after your surgery.  Make sure that you call for this appointment within a day or two after you arrive home to insure a convenient appointment time. 10.OTHER INSTRUCTIONS: _OK TO SHOWER STARTING TOMORROW ICE PACK, TYLENOL, AND IBUPROFEN ALSO FOR PAIN NO LIFTING MORE THAN 15 POUNDS FOR 4 WEEKS________________________    _____________________________________  WHEN TO CALL YOUR DOCTOR: 1. Fever over 101.0 2. Inability to urinate 3. Nausea and/or vomiting 4. Extreme swelling or bruising 5. Continued bleeding from incision. 6. Increased pain, redness, or drainage from the incision  The clinic staff is available to answer your questions during regular business hours.  Please don't hesitate to call and ask to speak to one of the nurses for clinical concerns.  If you have a medical emergency, go to the nearest   emergency room or call 911.  A surgeon from [facility] is always on call at the hospital   [address], [address], [place], [place]  [address] ?  [address] [address], [place], [place]   [address] [phone] ? [phone] ? FAX [phone] Web site:  [email or web address] 

## 2020-07-05 NOTE — Anesthesia Preprocedure Evaluation (Signed)
Anesthesia Evaluation  Patient identified by MRN, date of birth, ID band Patient awake    Reviewed: Allergy & Precautions, NPO status , Patient's Chart, lab work & pertinent test results  Airway Mallampati: I  TM Distance: >3 FB Neck ROM: Full    Dental   Pulmonary sleep apnea , former smoker,    Pulmonary exam normal        Cardiovascular hypertension, Pt. on medications Normal cardiovascular exam     Neuro/Psych    GI/Hepatic   Endo/Other  diabetes  Renal/GU Renal InsufficiencyRenal disease     Musculoskeletal   Abdominal   Peds  Hematology   Anesthesia Other Findings   Reproductive/Obstetrics                             Anesthesia Physical Anesthesia Plan  ASA: III  Anesthesia Plan: General   Post-op Pain Management:  Regional for Post-op pain   Induction: Intravenous  PONV Risk Score and Plan: Ondansetron and Treatment may vary due to age or medical condition  Airway Management Planned: LMA  Additional Equipment:   Intra-op Plan:   Post-operative Plan: Extubation in OR  Informed Consent: I have reviewed the patients History and Physical, chart, labs and discussed the procedure including the risks, benefits and alternatives for the proposed anesthesia with the patient or authorized representative who has indicated his/her understanding and acceptance.       Plan Discussed with: CRNA and Surgeon  Anesthesia Plan Comments:         Anesthesia Quick Evaluation

## 2020-07-05 NOTE — Op Note (Signed)
   David Nguyen 07/05/2020   Pre-op Diagnosis:  RIGHT INGUINAL HERNIA     Post-op Diagnosis: SAME  Procedure(s): HERNIA REPAIR INGUINAL WITH MESH   Surgeon(s): Coralie Keens, MD  Anesthesia: General  Staff:  Circulator: Rozell Searing, RN; Noreene Larsson, Filbert Berthold, RN Scrub Person: Celene Squibb, RN Circulator Assistant: Allene Dillon, RN RN First Assistant: Rozell Searing, RN Float Surgical Tech: Rolan Bucco  Estimated Blood Loss: Minimal               Procedure: The patient was brought to operating identifies correct patient.  He is placed upon the operating table general anesthesia was induced.  He had already received a preoperative right-sided tap block by anesthesiology.  His abdomen was prepped and draped in usual sterile fashion.  I anesthetized the skin in the right lower quadrant Marcaine and then made a longitudinal incision with a scalpel.  I dissected down through Scarpa's fascia with electrocautery.  I then opened the external beak fascia toward the internal and external rings.  The patient had a very large testicular cord containing a large amount of omentum that is gone down to the scrotum.  I was able to reduce the omentum and then finally separated the large sac from the cord structures.  I controlled the cord structures with a Penrose drain.  I then excised redundant fat and part of the hernia sac.  I then was able to reduce the rest of the sac and omentum back into the abdominal cavity through the internal ring.  I then tighten the internal ring with a figure-of-eight 2-0 silk suture.  Next a piece of progrip Prolene mesh was brought to the field.  I used a large piece of mesh from [employer].  It was placed against the pubic tubercle and then brought around the cord structures.  I sutured in place with a 2-0 Vicryl suture at the pubis and then at the internal ring.  Wide coverage of the inguinal floor appeared to be achieved.  I then closed the external beak  fascia over the top of the mesh with a running 2-0 Vicryl suture.  Scarpa's fascia was closed interrupted 3-0 Vicryl sutures and skin was closed with running 4-0 Monocryl.  Dermabond then applied.  The patient tolerated the procedure well.  All counts were correct at the end of the procedure.  The patient was then extubated in the operating room and taken in a stable condition to the recovery room.          Coralie Keens   Date: 07/05/2020  Time: 10:33 AM

## 2020-07-05 NOTE — Transfer of Care (Signed)
Immediate Anesthesia Transfer of Care Note  Patient: David Nguyen  Procedure(s) Performed: HERNIA REPAIR INGUINAL ADULT (Right Inguinal)  Patient Location: PACU   Anesthesia Type:GA combined with regional for post-op pain  Level of Consciousness: awake, alert  and oriented  Airway & Oxygen Therapy: Patient Spontanous Breathing and Patient connected to nasal cannula oxygen  Post-op Assessment: Report given to RN, Post -op Vital signs reviewed and stable and Patient moving all extremities  Post vital signs: Reviewed and stable  Last Vitals:  Vitals Value Taken Time  BP 134/58 07/05/20 1048  Temp 36.1 C 07/05/20 1045  Pulse 61 07/05/20 1049  Resp 22 07/05/20 1049  SpO2 100 % 07/05/20 1049  Vitals shown include unvalidated device data.  Last Pain:  Vitals:   07/05/20 1045  TempSrc:   PainSc: 10-Worst pain ever      Patients Stated Pain Goal: 3 (16/94/50 3888)  Complications: No complications documented.

## 2020-07-06 ENCOUNTER — Encounter (HOSPITAL_COMMUNITY): Payer: Self-pay | Admitting: Surgery

## 2020-07-07 ENCOUNTER — Other Ambulatory Visit: Payer: Self-pay

## 2020-07-07 ENCOUNTER — Encounter (HOSPITAL_COMMUNITY)
Admission: RE | Admit: 2020-07-07 | Discharge: 2020-07-07 | Disposition: A | Discharge status: Home / Self Care | Payer: Medicare HMO | Source: Ambulatory Visit | Attending: Nephrology | Admitting: Nephrology

## 2020-07-07 VITALS — BP 160/64 | HR 84 | Temp 97.7°F | Resp 20

## 2020-07-07 DIAGNOSIS — N189 Chronic kidney disease, unspecified: Secondary | ICD-10-CM | POA: Insufficient documentation

## 2020-07-07 DIAGNOSIS — Z862 Personal history of diseases of the blood and blood-forming organs and certain disorders involving the immune mechanism: Secondary | ICD-10-CM | POA: Diagnosis not present

## 2020-07-07 LAB — POCT HEMOGLOBIN-HEMACUE: Hemoglobin: 11.2 g/dL — ABNORMAL LOW (ref 13.0–17.0)

## 2020-07-07 LAB — IRON AND TIBC
Iron: 43 ug/dL — ABNORMAL LOW (ref 45–182)
Saturation Ratios: 17 % — ABNORMAL LOW (ref 17.9–39.5)
TIBC: 248 ug/dL — ABNORMAL LOW (ref 250–450)
UIBC: 205 ug/dL

## 2020-07-07 LAB — FERRITIN: Ferritin: 422 ng/mL — ABNORMAL HIGH (ref 24–336)

## 2020-07-07 MED ORDER — EPOETIN ALFA-EPBX 10000 UNIT/ML IJ SOLN
INTRAMUSCULAR | Status: AC
  Filled 2020-07-07: qty 3

## 2020-07-07 MED ORDER — EPOETIN ALFA-EPBX 10000 UNIT/ML IJ SOLN
30000.0000 [IU] | INTRAMUSCULAR | Status: DC
  Administered 2020-07-07: 30000 [IU] via SUBCUTANEOUS

## 2020-07-21 DIAGNOSIS — E1129 Type 2 diabetes mellitus with other diabetic kidney complication: Secondary | ICD-10-CM | POA: Diagnosis not present

## 2020-07-21 DIAGNOSIS — Z125 Encounter for screening for malignant neoplasm of prostate: Secondary | ICD-10-CM | POA: Diagnosis not present

## 2020-07-21 DIAGNOSIS — Z0000 Encounter for general adult medical examination without abnormal findings: Secondary | ICD-10-CM | POA: Diagnosis not present

## 2020-07-21 DIAGNOSIS — E785 Hyperlipidemia, unspecified: Secondary | ICD-10-CM | POA: Diagnosis not present

## 2020-07-21 DIAGNOSIS — E039 Hypothyroidism, unspecified: Secondary | ICD-10-CM | POA: Diagnosis not present

## 2020-07-21 DIAGNOSIS — E291 Testicular hypofunction: Secondary | ICD-10-CM | POA: Diagnosis not present

## 2020-07-27 ENCOUNTER — Other Ambulatory Visit (HOSPITAL_COMMUNITY): Payer: Self-pay | Admitting: *Deleted

## 2020-07-28 ENCOUNTER — Other Ambulatory Visit: Payer: Self-pay

## 2020-07-28 ENCOUNTER — Ambulatory Visit (HOSPITAL_COMMUNITY)
Admission: RE | Admit: 2020-07-28 | Discharge: 2020-07-28 | Disposition: A | Discharge status: Home / Self Care | Payer: Medicare HMO | Source: Ambulatory Visit | Attending: Nephrology | Admitting: Nephrology

## 2020-07-28 VITALS — BP 148/64 | HR 73 | Temp 97.5°F | Resp 20

## 2020-07-28 DIAGNOSIS — N189 Chronic kidney disease, unspecified: Secondary | ICD-10-CM | POA: Insufficient documentation

## 2020-07-28 DIAGNOSIS — N184 Chronic kidney disease, stage 4 (severe): Secondary | ICD-10-CM | POA: Diagnosis not present

## 2020-07-28 DIAGNOSIS — R82998 Other abnormal findings in urine: Secondary | ICD-10-CM | POA: Diagnosis not present

## 2020-07-28 DIAGNOSIS — I130 Hypertensive heart and chronic kidney disease with heart failure and stage 1 through stage 4 chronic kidney disease, or unspecified chronic kidney disease: Secondary | ICD-10-CM | POA: Diagnosis not present

## 2020-07-28 DIAGNOSIS — D631 Anemia in chronic kidney disease: Secondary | ICD-10-CM | POA: Diagnosis not present

## 2020-07-28 DIAGNOSIS — G4733 Obstructive sleep apnea (adult) (pediatric): Secondary | ICD-10-CM | POA: Diagnosis not present

## 2020-07-28 DIAGNOSIS — I129 Hypertensive chronic kidney disease with stage 1 through stage 4 chronic kidney disease, or unspecified chronic kidney disease: Secondary | ICD-10-CM | POA: Diagnosis not present

## 2020-07-28 DIAGNOSIS — Z862 Personal history of diseases of the blood and blood-forming organs and certain disorders involving the immune mechanism: Secondary | ICD-10-CM | POA: Insufficient documentation

## 2020-07-28 DIAGNOSIS — E1129 Type 2 diabetes mellitus with other diabetic kidney complication: Secondary | ICD-10-CM | POA: Diagnosis not present

## 2020-07-28 DIAGNOSIS — I872 Venous insufficiency (chronic) (peripheral): Secondary | ICD-10-CM | POA: Diagnosis not present

## 2020-07-28 DIAGNOSIS — Z0000 Encounter for general adult medical examination without abnormal findings: Secondary | ICD-10-CM | POA: Diagnosis not present

## 2020-07-28 DIAGNOSIS — I5032 Chronic diastolic (congestive) heart failure: Secondary | ICD-10-CM | POA: Diagnosis not present

## 2020-07-28 DIAGNOSIS — R001 Bradycardia, unspecified: Secondary | ICD-10-CM | POA: Diagnosis not present

## 2020-07-28 DIAGNOSIS — Z23 Encounter for immunization: Secondary | ICD-10-CM | POA: Diagnosis not present

## 2020-07-28 LAB — POCT HEMOGLOBIN-HEMACUE: Hemoglobin: 11.4 g/dL — ABNORMAL LOW (ref 13.0–17.0)

## 2020-07-28 MED ORDER — EPOETIN ALFA-EPBX 40000 UNIT/ML IJ SOLN: 30000.0000 [IU] | INTRAMUSCULAR | Status: DC

## 2020-07-28 MED ORDER — EPOETIN ALFA-EPBX 10000 UNIT/ML IJ SOLN
INTRAMUSCULAR | Status: AC
  Administered 2020-07-28: 30000 [IU] via SUBCUTANEOUS
  Filled 2020-07-28: qty 3

## 2020-08-18 ENCOUNTER — Encounter (HOSPITAL_COMMUNITY)
Admission: RE | Admit: 2020-08-18 | Discharge: 2020-08-18 | Disposition: A | Discharge status: Home / Self Care | Payer: Medicare HMO | Source: Ambulatory Visit | Attending: Nephrology | Admitting: Nephrology

## 2020-08-18 ENCOUNTER — Encounter (HOSPITAL_COMMUNITY): Payer: Medicare HMO

## 2020-08-18 ENCOUNTER — Other Ambulatory Visit: Payer: Self-pay

## 2020-08-18 VITALS — BP 148/49 | HR 64 | Temp 97.7°F | Resp 20

## 2020-08-18 DIAGNOSIS — Z862 Personal history of diseases of the blood and blood-forming organs and certain disorders involving the immune mechanism: Secondary | ICD-10-CM | POA: Insufficient documentation

## 2020-08-18 DIAGNOSIS — N185 Chronic kidney disease, stage 5: Secondary | ICD-10-CM | POA: Diagnosis not present

## 2020-08-18 DIAGNOSIS — N189 Chronic kidney disease, unspecified: Secondary | ICD-10-CM | POA: Diagnosis not present

## 2020-08-18 LAB — POCT HEMOGLOBIN-HEMACUE: Hemoglobin: 10.3 g/dL — ABNORMAL LOW (ref 13.0–17.0)

## 2020-08-18 MED ORDER — EPOETIN ALFA-EPBX 40000 UNIT/ML IJ SOLN: 30000.0000 [IU] | INTRAMUSCULAR | Status: DC

## 2020-08-18 MED ORDER — SODIUM CHLORIDE 0.9 % IV SOLN
510.0000 mg | Freq: Once | INTRAVENOUS | Status: AC
  Administered 2020-08-18: 510 mg via INTRAVENOUS
  Filled 2020-08-18: qty 17

## 2020-08-18 MED ORDER — EPOETIN ALFA-EPBX 40000 UNIT/ML IJ SOLN
INTRAMUSCULAR | Status: AC
  Administered 2020-08-18: 30000 [IU] via SUBCUTANEOUS
  Filled 2020-08-18: qty 1

## 2020-08-24 DIAGNOSIS — I120 Hypertensive chronic kidney disease with stage 5 chronic kidney disease or end stage renal disease: Secondary | ICD-10-CM | POA: Diagnosis not present

## 2020-08-24 DIAGNOSIS — N185 Chronic kidney disease, stage 5: Secondary | ICD-10-CM | POA: Diagnosis not present

## 2020-08-24 DIAGNOSIS — R609 Edema, unspecified: Secondary | ICD-10-CM | POA: Diagnosis not present

## 2020-08-24 DIAGNOSIS — I770 Arteriovenous fistula, acquired: Secondary | ICD-10-CM | POA: Diagnosis not present

## 2020-08-24 DIAGNOSIS — Q600 Renal agenesis, unilateral: Secondary | ICD-10-CM | POA: Diagnosis not present

## 2020-08-24 DIAGNOSIS — N2581 Secondary hyperparathyroidism of renal origin: Secondary | ICD-10-CM | POA: Diagnosis not present

## 2020-08-24 DIAGNOSIS — D631 Anemia in chronic kidney disease: Secondary | ICD-10-CM | POA: Diagnosis not present

## 2020-09-08 ENCOUNTER — Other Ambulatory Visit: Payer: Self-pay

## 2020-09-08 ENCOUNTER — Encounter (HOSPITAL_COMMUNITY)
Admission: RE | Admit: 2020-09-08 | Discharge: 2020-09-08 | Disposition: A | Discharge status: Home / Self Care | Payer: Medicare HMO | Source: Ambulatory Visit | Attending: Nephrology | Admitting: Nephrology

## 2020-09-08 VITALS — BP 151/61 | HR 65 | Temp 97.2°F | Resp 20

## 2020-09-08 DIAGNOSIS — N189 Chronic kidney disease, unspecified: Secondary | ICD-10-CM | POA: Diagnosis not present

## 2020-09-08 DIAGNOSIS — Z862 Personal history of diseases of the blood and blood-forming organs and certain disorders involving the immune mechanism: Secondary | ICD-10-CM | POA: Insufficient documentation

## 2020-09-08 LAB — IRON AND TIBC
Iron: 92 ug/dL (ref 45–182)
Saturation Ratios: 34 % (ref 17.9–39.5)
TIBC: 272 ug/dL (ref 250–450)
UIBC: 180 ug/dL

## 2020-09-08 LAB — FERRITIN: Ferritin: 421 ng/mL — ABNORMAL HIGH (ref 24–336)

## 2020-09-08 MED ORDER — EPOETIN ALFA-EPBX 10000 UNIT/ML IJ SOLN
INTRAMUSCULAR | Status: AC
  Administered 2020-09-08: 30000 [IU]
  Filled 2020-09-08: qty 3

## 2020-09-08 MED ORDER — EPOETIN ALFA-EPBX 40000 UNIT/ML IJ SOLN: 30000.0000 [IU] | INTRAMUSCULAR | Status: DC

## 2020-09-09 LAB — POCT HEMOGLOBIN-HEMACUE: Hemoglobin: 11.6 g/dL — ABNORMAL LOW (ref 13.0–17.0)

## 2020-09-28 ENCOUNTER — Other Ambulatory Visit (HOSPITAL_COMMUNITY): Payer: Self-pay | Admitting: *Deleted

## 2020-09-29 ENCOUNTER — Encounter (HOSPITAL_COMMUNITY)
Admission: RE | Admit: 2020-09-29 | Discharge: 2020-09-29 | Disposition: A | Discharge status: Home / Self Care | Payer: Medicare HMO | Source: Ambulatory Visit | Attending: Nephrology | Admitting: Nephrology

## 2020-09-29 ENCOUNTER — Other Ambulatory Visit: Payer: Self-pay

## 2020-09-29 VITALS — BP 140/51 | HR 64 | Temp 97.3°F | Resp 20

## 2020-09-29 DIAGNOSIS — Z862 Personal history of diseases of the blood and blood-forming organs and certain disorders involving the immune mechanism: Secondary | ICD-10-CM

## 2020-09-29 DIAGNOSIS — N189 Chronic kidney disease, unspecified: Secondary | ICD-10-CM | POA: Diagnosis not present

## 2020-09-29 LAB — POCT HEMOGLOBIN-HEMACUE: Hemoglobin: 11.7 g/dL — ABNORMAL LOW (ref 13.0–17.0)

## 2020-09-29 MED ORDER — EPOETIN ALFA-EPBX 40000 UNIT/ML IJ SOLN: 30000.0000 [IU] | INTRAMUSCULAR | Status: DC

## 2020-09-29 MED ORDER — SODIUM CHLORIDE 0.9 % IV SOLN
510.0000 mg | Freq: Once | INTRAVENOUS | Status: AC
  Administered 2020-09-29: 510 mg via INTRAVENOUS
  Filled 2020-09-29: qty 510

## 2020-09-29 MED ORDER — EPOETIN ALFA-EPBX 40000 UNIT/ML IJ SOLN
INTRAMUSCULAR | Status: AC
  Administered 2020-09-29: 09:00:00 30000 [IU] via SUBCUTANEOUS
  Filled 2020-09-29: qty 1

## 2020-10-09 IMAGING — DX PORTABLE CHEST - 1 VIEW
2 series · 2 of 2 positions shown · non-contrast
Comparison: Portable exam 6004 hours without priors for comparison

CLINICAL DATA: Chest pain and shortness of breath

EXAM:
PORTABLE CHEST 1 VIEW

[chest ap (1 of 2)]
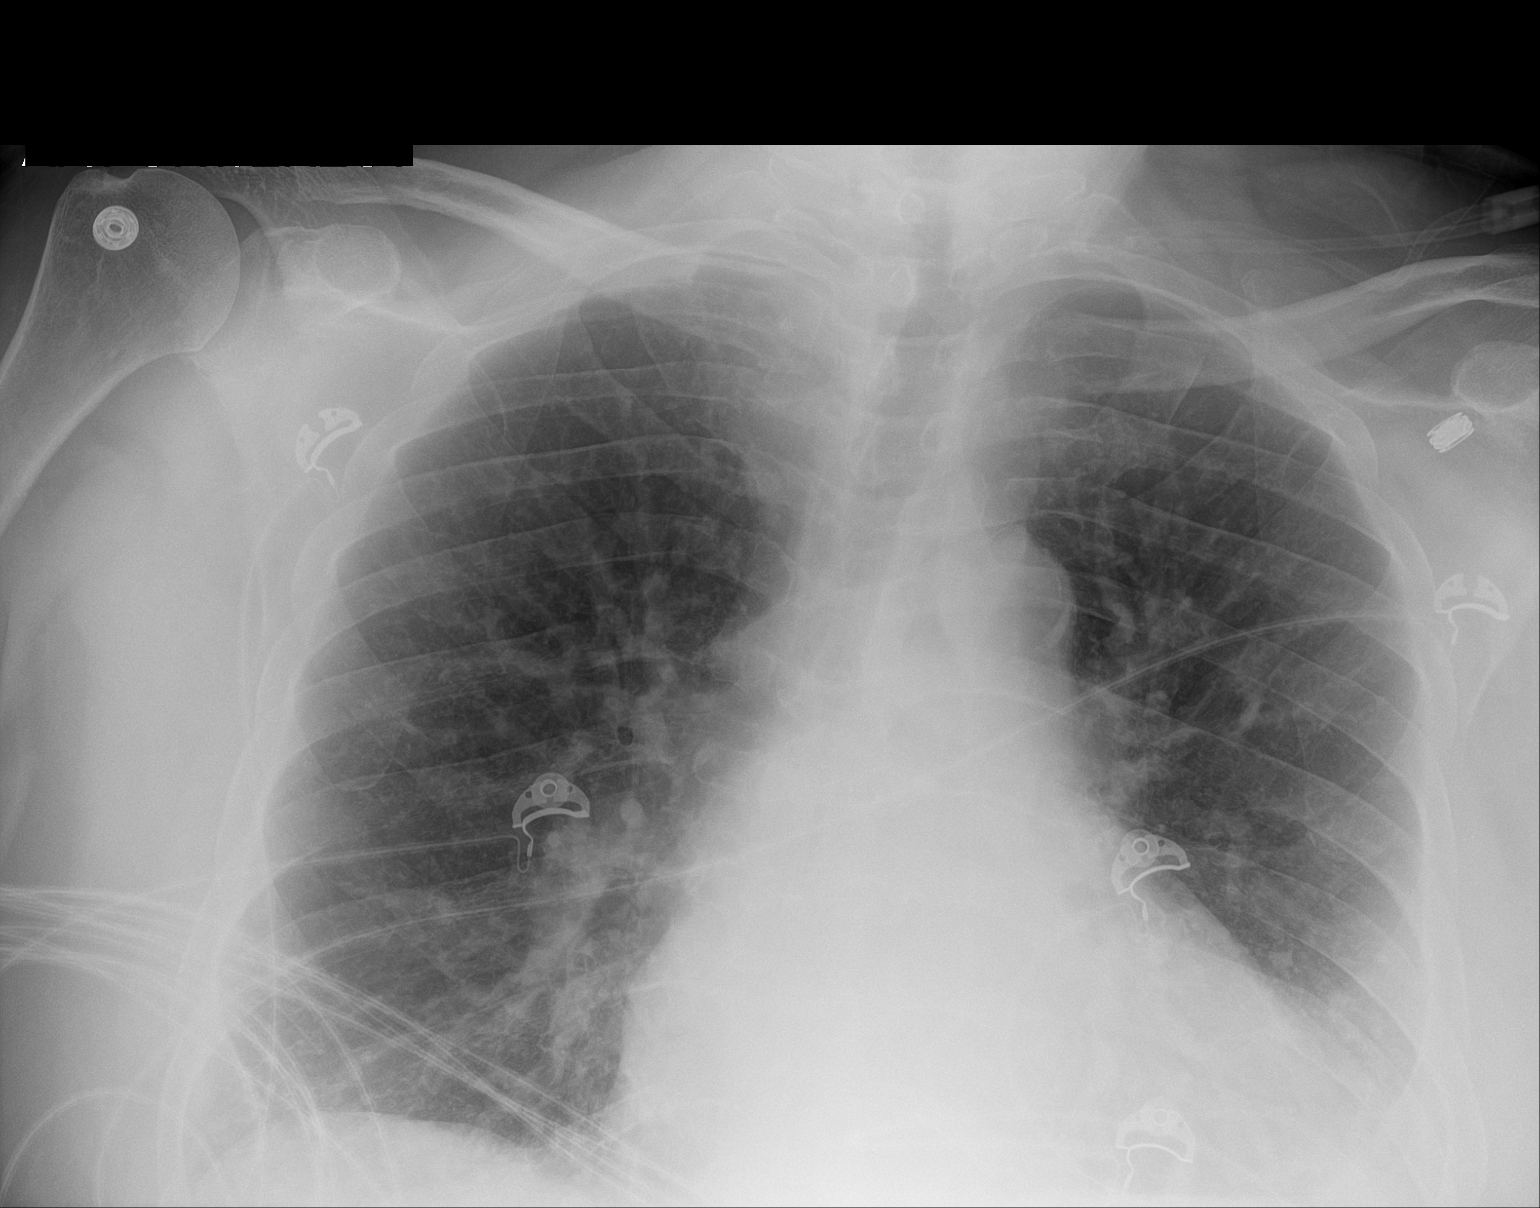

[chest ap (2 of 2)]
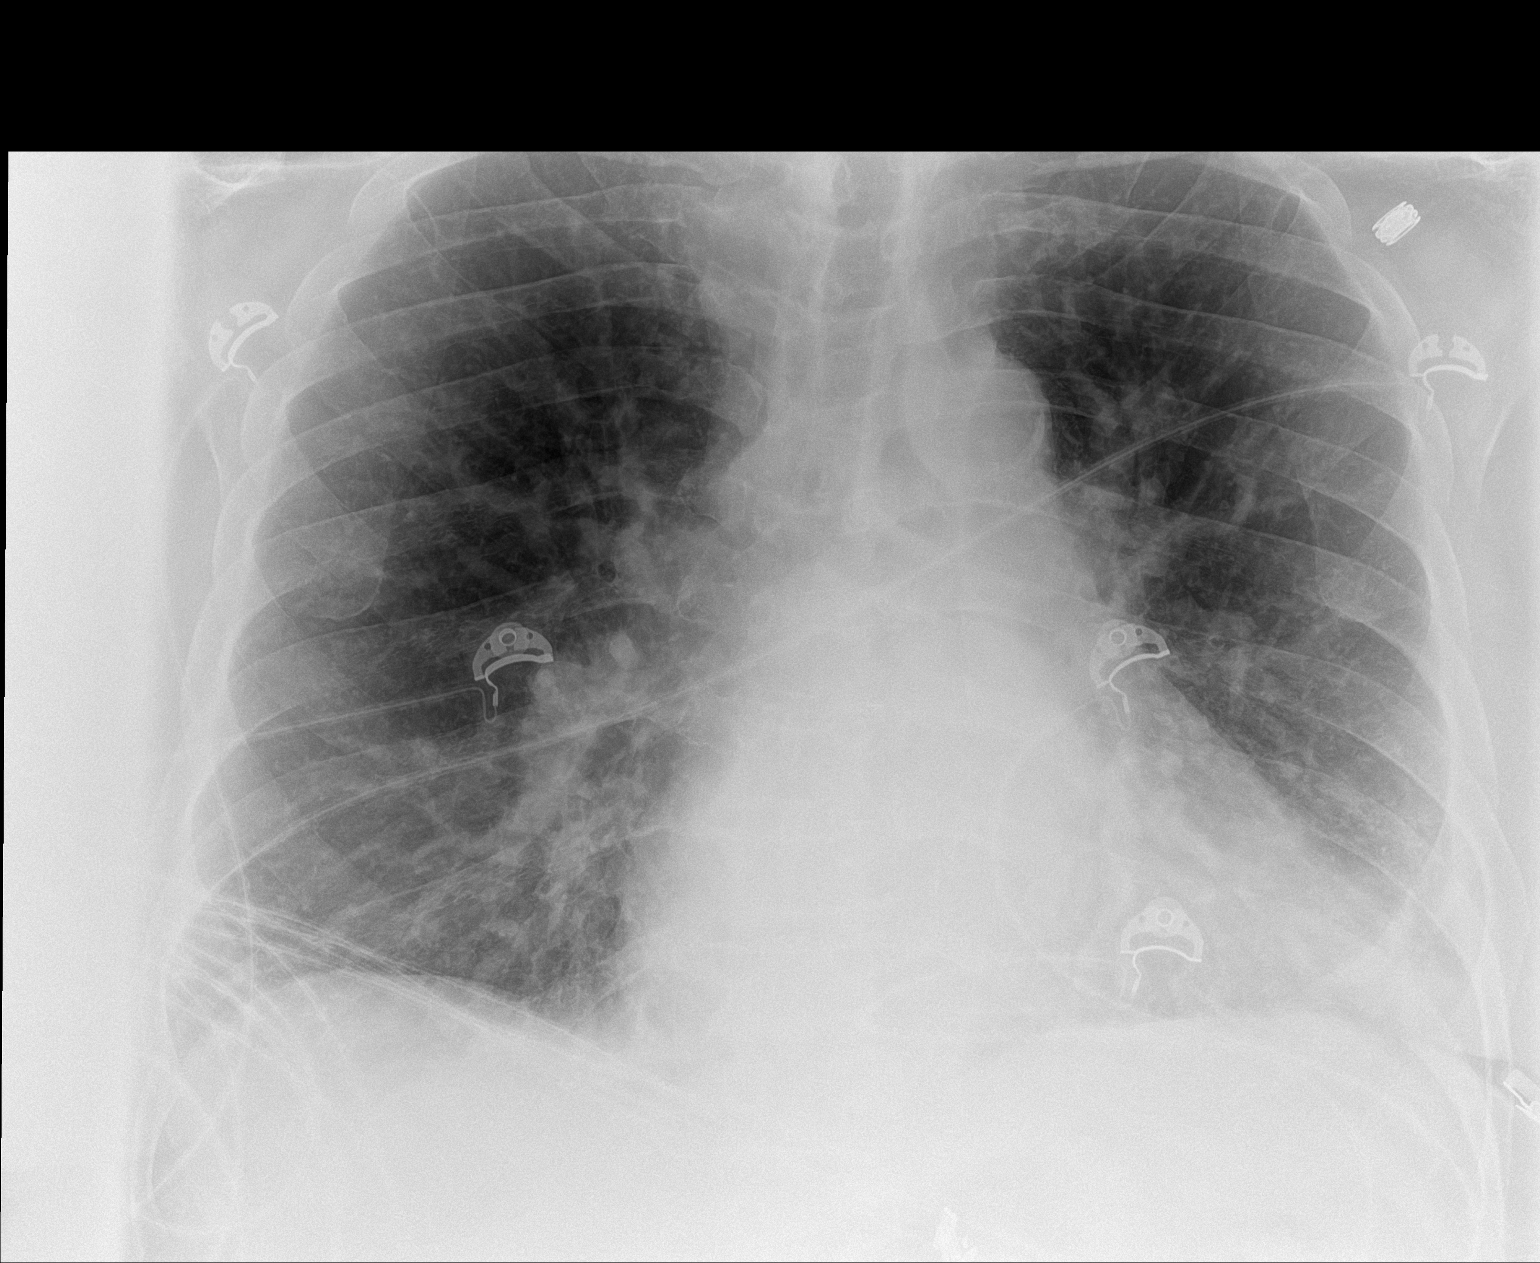

[2 of 2 positions shown; findings below may reference images not displayed]

FINDINGS: Enlargement of cardiac silhouette with pulmonary vascular
congestion.

Atherosclerotic calcification aorta.

Hazy LEFT basilar infiltrate.

Remaining lungs clear.

No pleural effusion or pneumothorax.

Osseous structures unremarkable.
IMPRESSION: Enlargement of cardiac silhouette with pulmonary vascular
congestion.

Hazy LEFT basilar infiltrate question pneumonia.

## 2020-10-10 IMAGING — US US RENAL
1 series · 14 of 24 positions shown · non-contrast
Comparison: None.

CLINICAL DATA: Acute kidney injury.

EXAM:
RENAL / URINARY TRACT ULTRASOUND COMPLETE

[Series 1: us renal · 14 of 24 slices shown]
[im 1/24]
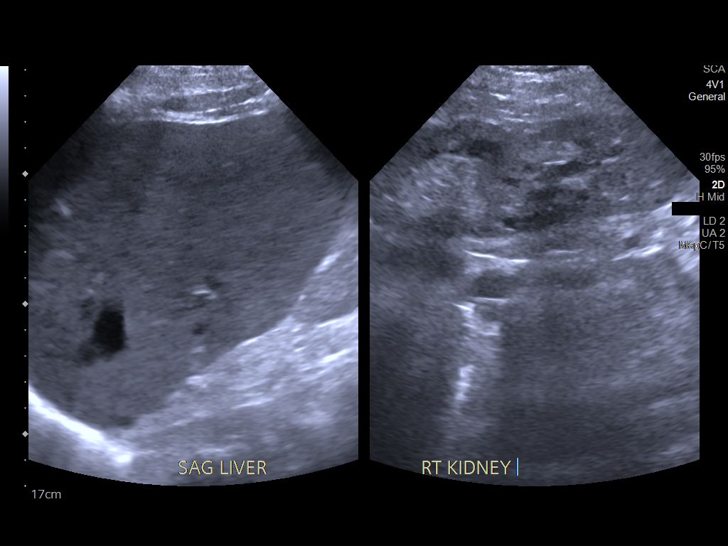
[im 3/24]
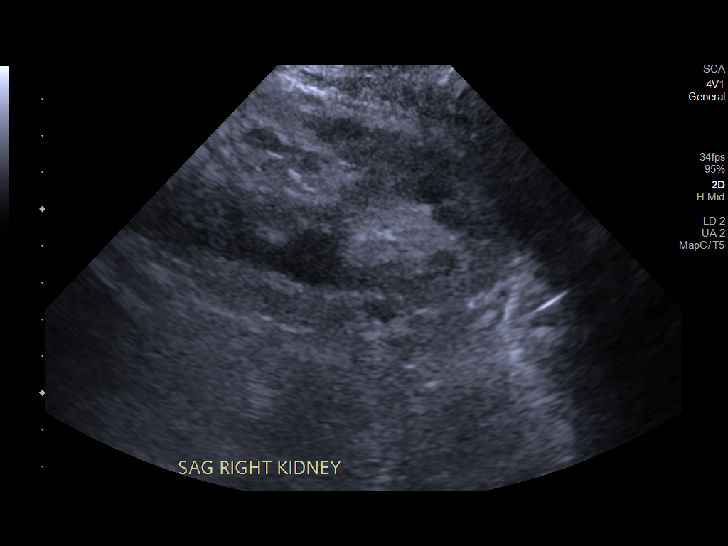
[im 5/24]
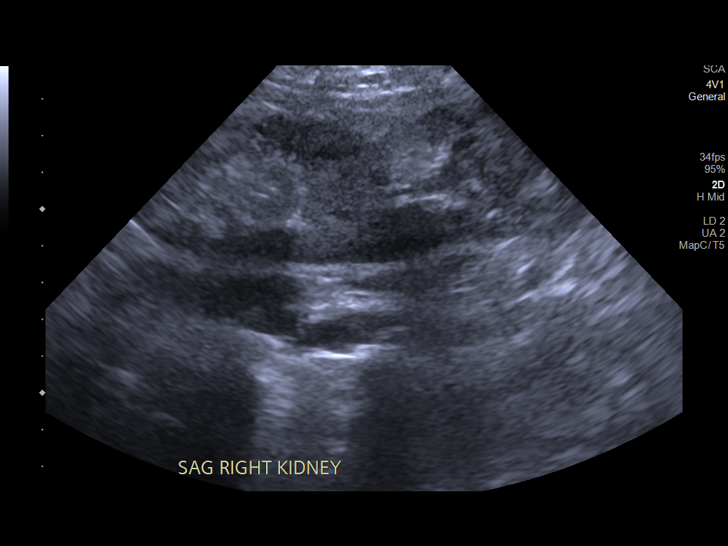
[im 7/24]
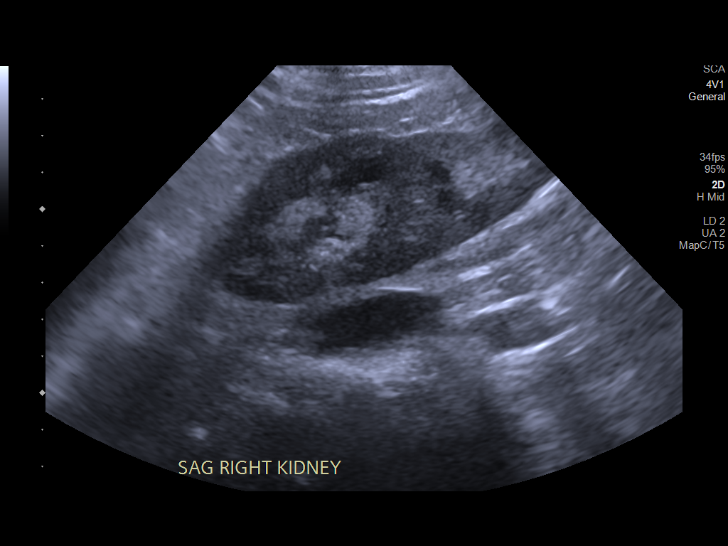
[im 8/24]
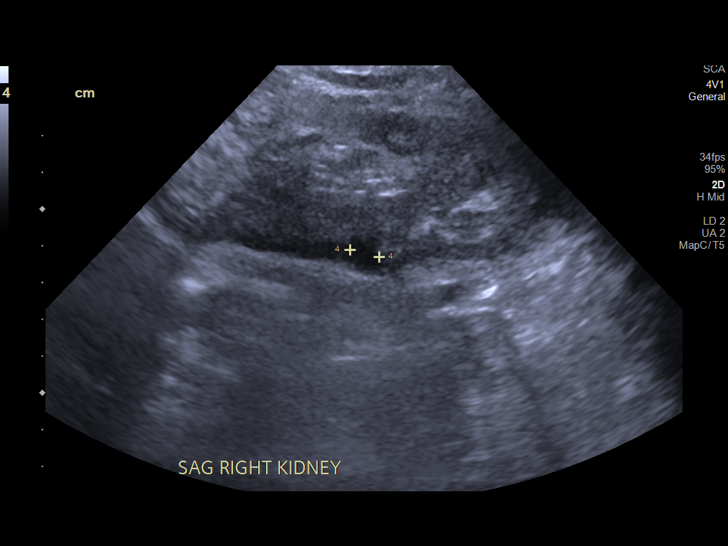
[im 10/24]
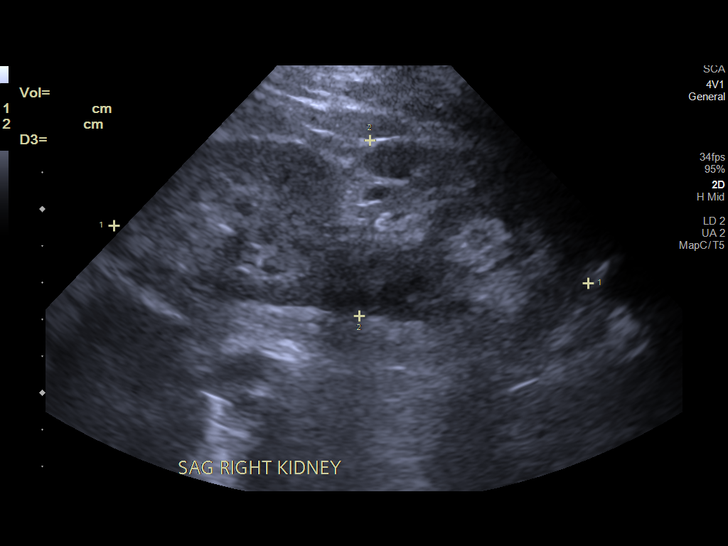
[im 12/24]
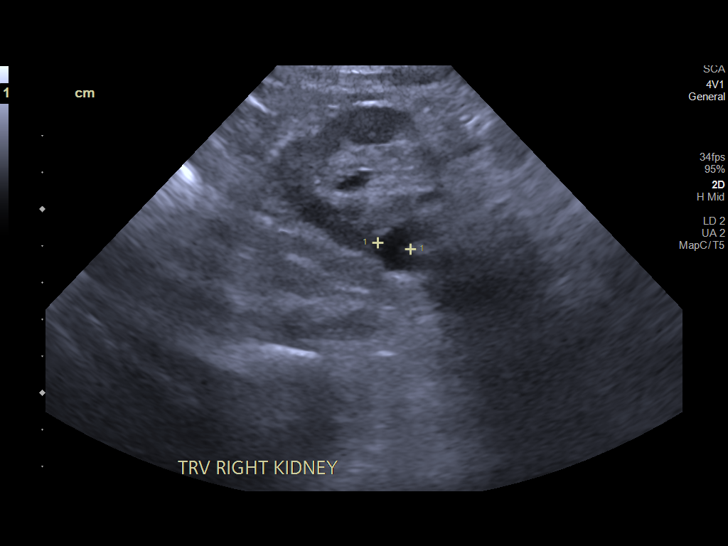
[im 13/24]
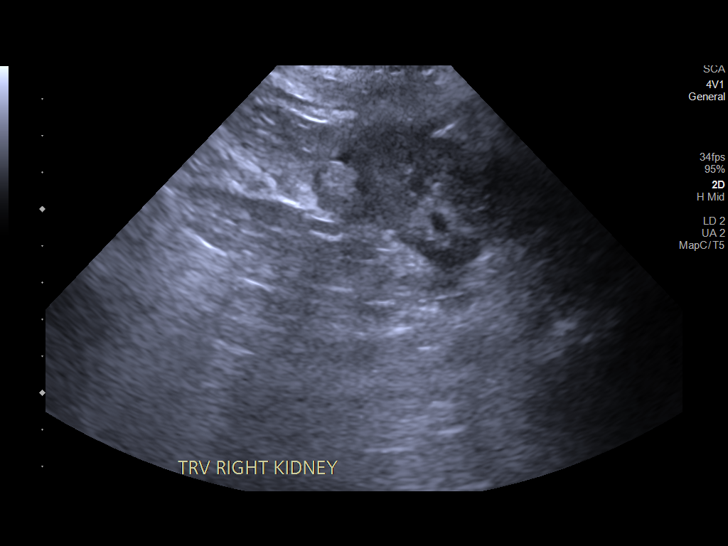
[im 15/24]
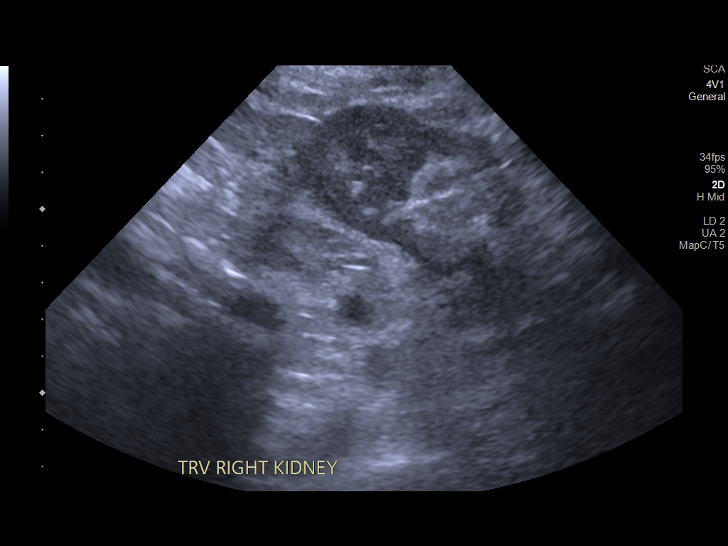
[im 17/24]
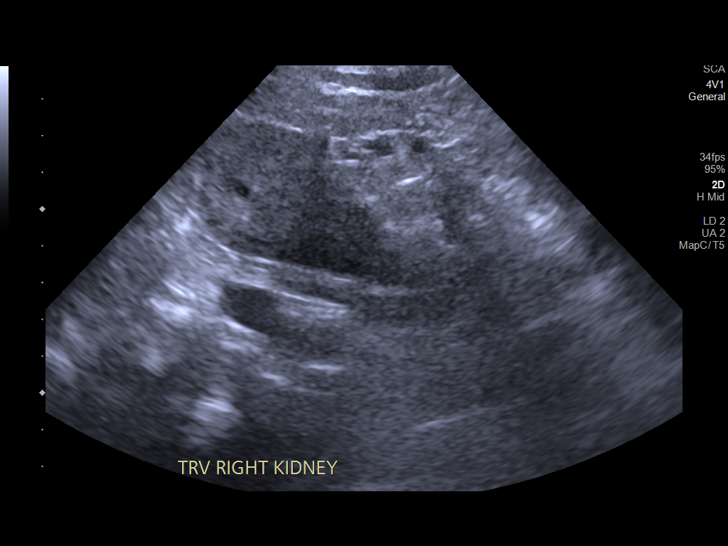
[im 19/24]
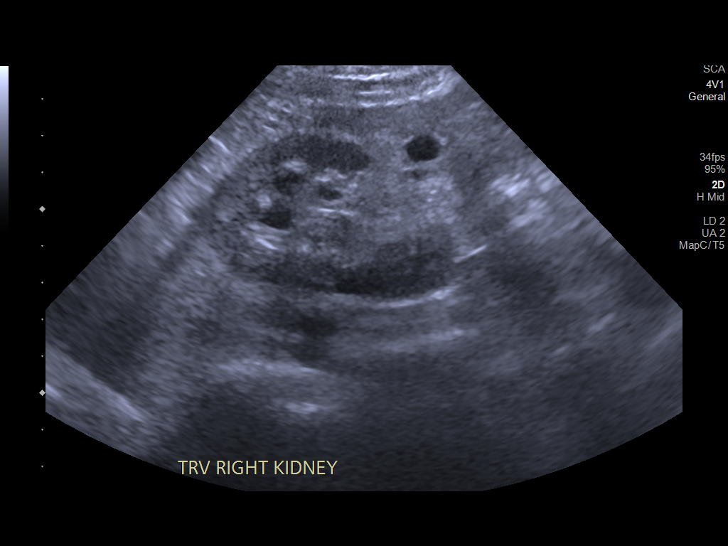
[im 20/24]
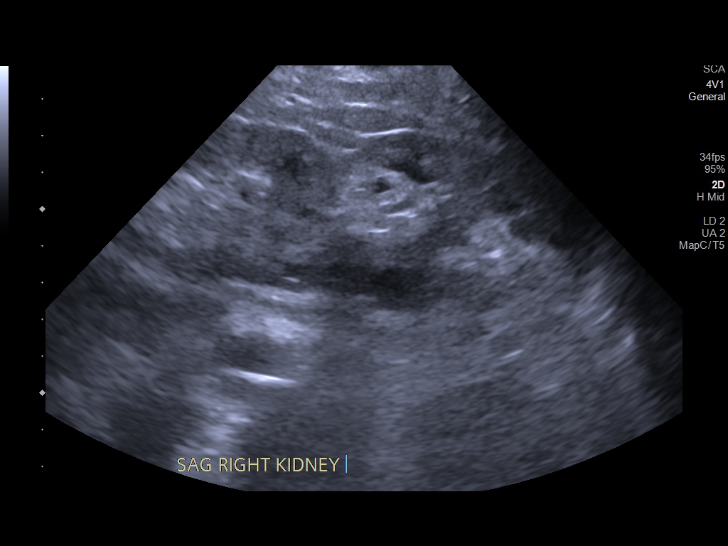
[im 22/24]
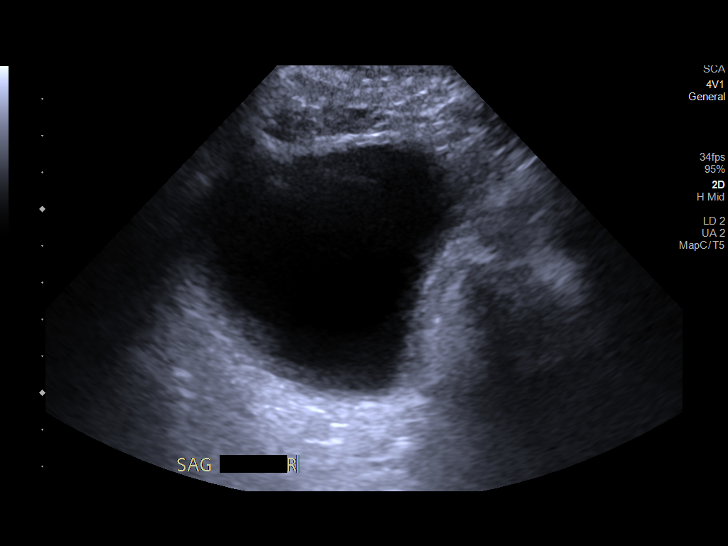
[im 24/24]
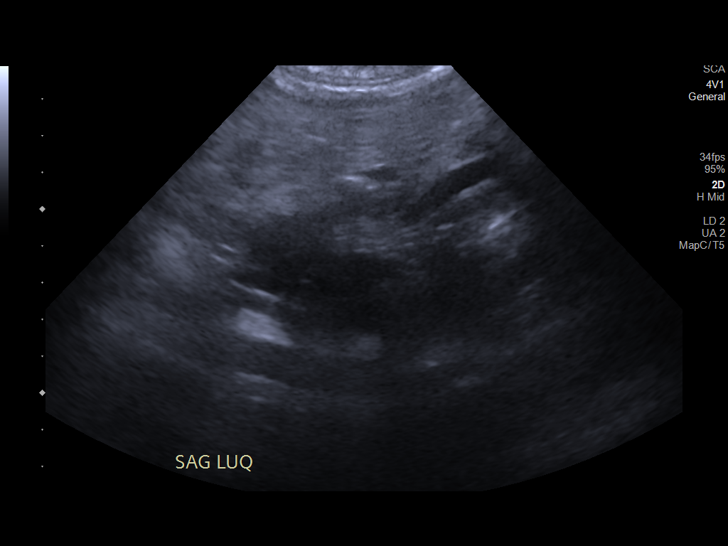

[14 of 24 positions shown; findings below may reference images not displayed]

FINDINGS: Right Kidney:

Pelvic kidney located in the right lower quadrant. Renal
measurements: 13 x 4.8 x 4.5 cm = volume: 145 mL. Renal collecting
system is duplicated. Echogenicity within normal limits. Two
subcentimeter cysts. No solid mass or hydronephrosis visualized.

Left Kidney:

Not visualized in the renal fossa or abdomen.

Bladder:

Appears normal for degree of bladder distention.
IMPRESSION: 1. Findings suspicious for crossed fused renal ectopia. Solitary
kidney located in the right pelvis with duplicated renal collecting
system.
2. No evidence of renal obstruction.  Two subcentimeter renal cysts.

## 2020-10-19 DIAGNOSIS — H903 Sensorineural hearing loss, bilateral: Secondary | ICD-10-CM | POA: Diagnosis not present

## 2020-10-20 ENCOUNTER — Other Ambulatory Visit: Payer: Self-pay

## 2020-10-20 ENCOUNTER — Encounter (HOSPITAL_COMMUNITY)
Admission: RE | Admit: 2020-10-20 | Discharge: 2020-10-20 | Disposition: A | Discharge status: Home / Self Care | Payer: Medicare HMO | Source: Ambulatory Visit | Attending: Nephrology | Admitting: Nephrology

## 2020-10-20 VITALS — BP 170/66 | HR 68 | Temp 97.3°F | Resp 20

## 2020-10-20 DIAGNOSIS — Z862 Personal history of diseases of the blood and blood-forming organs and certain disorders involving the immune mechanism: Secondary | ICD-10-CM | POA: Insufficient documentation

## 2020-10-20 DIAGNOSIS — N189 Chronic kidney disease, unspecified: Secondary | ICD-10-CM | POA: Diagnosis not present

## 2020-10-20 LAB — CBC WITH DIFFERENTIAL/PLATELET
Abs Immature Granulocytes: 0.01 10*3/uL (ref 0.00–0.07)
Basophils Absolute: 0.1 10*3/uL (ref 0.0–0.1)
Basophils Relative: 1 %
Eosinophils Absolute: 0.3 10*3/uL (ref 0.0–0.5)
Eosinophils Relative: 4 %
HCT: 37.1 % — ABNORMAL LOW (ref 39.0–52.0)
Hemoglobin: 11.7 g/dL — ABNORMAL LOW (ref 13.0–17.0)
Immature Granulocytes: 0 %
Lymphocytes Relative: 19 %
Lymphs Abs: 1.1 10*3/uL (ref 0.7–4.0)
MCH: 26.6 pg (ref 26.0–34.0)
MCHC: 31.5 g/dL (ref 30.0–36.0)
MCV: 84.3 fL (ref 80.0–100.0)
Monocytes Absolute: 0.6 10*3/uL (ref 0.1–1.0)
Monocytes Relative: 9 %
Neutro Abs: 4 10*3/uL (ref 1.7–7.7)
Neutrophils Relative %: 67 %
Platelets: 241 10*3/uL (ref 150–400)
RBC: 4.4 MIL/uL (ref 4.22–5.81)
RDW: 19.7 % — ABNORMAL HIGH (ref 11.5–15.5)
WBC: 6 10*3/uL (ref 4.0–10.5)
nRBC: 0 % (ref 0.0–0.2)

## 2020-10-20 LAB — RENAL FUNCTION PANEL
Albumin: 3.2 g/dL — ABNORMAL LOW (ref 3.5–5.0)
Anion gap: 13 (ref 5–15)
BUN: 60 mg/dL — ABNORMAL HIGH (ref 8–23)
CO2: 22 mmol/L (ref 22–32)
Calcium: 9.7 mg/dL (ref 8.9–10.3)
Chloride: 105 mmol/L (ref 98–111)
Creatinine, Ser: 4.54 mg/dL — ABNORMAL HIGH (ref 0.61–1.24)
GFR, Estimated: 12 mL/min — ABNORMAL LOW (ref >60)
Glucose, Bld: 143 mg/dL — ABNORMAL HIGH (ref 70–99)
Phosphorus: 5.6 mg/dL — ABNORMAL HIGH (ref 2.5–4.6)
Potassium: 3.5 mmol/L (ref 3.5–5.1)
Sodium: 140 mmol/L (ref 135–145)

## 2020-10-20 LAB — IRON AND TIBC
Iron: 92 ug/dL (ref 45–182)
Saturation Ratios: 40 % — ABNORMAL HIGH (ref 17.9–39.5)
TIBC: 231 ug/dL — ABNORMAL LOW (ref 250–450)
UIBC: 139 ug/dL

## 2020-10-20 LAB — POCT HEMOGLOBIN-HEMACUE: Hemoglobin: 11.9 g/dL — ABNORMAL LOW (ref 13.0–17.0)

## 2020-10-20 LAB — FERRITIN: Ferritin: 494 ng/mL — ABNORMAL HIGH (ref 24–336)

## 2020-10-20 MED ORDER — EPOETIN ALFA-EPBX 40000 UNIT/ML IJ SOLN: 30000.0000 [IU] | INTRAMUSCULAR | Status: DC

## 2020-10-20 MED ORDER — EPOETIN ALFA-EPBX 10000 UNIT/ML IJ SOLN
INTRAMUSCULAR | Status: AC
  Administered 2020-10-20: 30000 [IU]
  Filled 2020-10-20: qty 3

## 2020-10-21 LAB — PTH, INTACT AND CALCIUM
Calcium, Total (PTH): 9.6 mg/dL (ref 8.6–10.2)
PTH: 66 pg/mL — ABNORMAL HIGH (ref 15–65)

## 2020-11-01 DIAGNOSIS — H903 Sensorineural hearing loss, bilateral: Secondary | ICD-10-CM | POA: Diagnosis not present

## 2020-11-02 DIAGNOSIS — R609 Edema, unspecified: Secondary | ICD-10-CM | POA: Diagnosis not present

## 2020-11-02 DIAGNOSIS — Q600 Renal agenesis, unilateral: Secondary | ICD-10-CM | POA: Diagnosis not present

## 2020-11-02 DIAGNOSIS — N2581 Secondary hyperparathyroidism of renal origin: Secondary | ICD-10-CM | POA: Diagnosis not present

## 2020-11-02 DIAGNOSIS — I120 Hypertensive chronic kidney disease with stage 5 chronic kidney disease or end stage renal disease: Secondary | ICD-10-CM | POA: Diagnosis not present

## 2020-11-02 DIAGNOSIS — N185 Chronic kidney disease, stage 5: Secondary | ICD-10-CM | POA: Diagnosis not present

## 2020-11-02 DIAGNOSIS — I770 Arteriovenous fistula, acquired: Secondary | ICD-10-CM | POA: Diagnosis not present

## 2020-11-02 DIAGNOSIS — D631 Anemia in chronic kidney disease: Secondary | ICD-10-CM | POA: Diagnosis not present

## 2020-11-10 ENCOUNTER — Encounter (HOSPITAL_COMMUNITY)
Admission: RE | Admit: 2020-11-10 | Discharge: 2020-11-10 | Disposition: A | Discharge status: Home / Self Care | Payer: Medicare HMO | Source: Ambulatory Visit | Attending: Nephrology | Admitting: Nephrology

## 2020-11-10 ENCOUNTER — Other Ambulatory Visit: Payer: Self-pay

## 2020-11-10 VITALS — BP 162/65 | HR 61 | Temp 97.6°F | Resp 20

## 2020-11-10 DIAGNOSIS — Z862 Personal history of diseases of the blood and blood-forming organs and certain disorders involving the immune mechanism: Secondary | ICD-10-CM | POA: Diagnosis not present

## 2020-11-10 DIAGNOSIS — N189 Chronic kidney disease, unspecified: Secondary | ICD-10-CM | POA: Insufficient documentation

## 2020-11-10 LAB — POCT HEMOGLOBIN-HEMACUE: Hemoglobin: 12.1 g/dL — ABNORMAL LOW (ref 13.0–17.0)

## 2020-11-10 MED ORDER — EPOETIN ALFA-EPBX 40000 UNIT/ML IJ SOLN: 30000.0000 [IU] | INTRAMUSCULAR | Status: DC

## 2020-11-24 ENCOUNTER — Other Ambulatory Visit: Payer: Self-pay

## 2020-11-24 ENCOUNTER — Encounter (HOSPITAL_COMMUNITY)
Admission: RE | Admit: 2020-11-24 | Discharge: 2020-11-24 | Disposition: A | Discharge status: Home / Self Care | Payer: Medicare HMO | Source: Ambulatory Visit | Attending: Nephrology | Admitting: Nephrology

## 2020-11-24 VITALS — BP 165/66 | HR 78 | Temp 97.6°F | Resp 20

## 2020-11-24 DIAGNOSIS — Z862 Personal history of diseases of the blood and blood-forming organs and certain disorders involving the immune mechanism: Secondary | ICD-10-CM | POA: Diagnosis not present

## 2020-11-24 DIAGNOSIS — N189 Chronic kidney disease, unspecified: Secondary | ICD-10-CM | POA: Diagnosis not present

## 2020-11-24 LAB — IRON AND TIBC
Iron: 85 ug/dL (ref 45–182)
Saturation Ratios: 34 % (ref 17.9–39.5)
TIBC: 246 ug/dL — ABNORMAL LOW (ref 250–450)
UIBC: 161 ug/dL

## 2020-11-24 LAB — POCT HEMOGLOBIN-HEMACUE: Hemoglobin: 11.7 g/dL — ABNORMAL LOW (ref 13.0–17.0)

## 2020-11-24 LAB — FERRITIN: Ferritin: 517 ng/mL — ABNORMAL HIGH (ref 24–336)

## 2020-11-24 MED ORDER — EPOETIN ALFA-EPBX 40000 UNIT/ML IJ SOLN
INTRAMUSCULAR | Status: AC
  Filled 2020-11-24: qty 1

## 2020-11-24 MED ORDER — EPOETIN ALFA-EPBX 40000 UNIT/ML IJ SOLN
30000.0000 [IU] | INTRAMUSCULAR | Status: DC
  Administered 2020-11-24: 30000 [IU] via SUBCUTANEOUS

## 2020-12-01 ENCOUNTER — Encounter (HOSPITAL_COMMUNITY): Payer: Medicare HMO

## 2020-12-15 ENCOUNTER — Other Ambulatory Visit: Payer: Self-pay

## 2020-12-15 ENCOUNTER — Encounter (HOSPITAL_COMMUNITY)
Admission: RE | Admit: 2020-12-15 | Discharge: 2020-12-15 | Disposition: A | Discharge status: Home / Self Care | Payer: Medicare HMO | Source: Ambulatory Visit | Attending: Nephrology | Admitting: Nephrology

## 2020-12-15 VITALS — BP 148/65 | HR 69 | Temp 97.3°F | Resp 20

## 2020-12-15 DIAGNOSIS — Z862 Personal history of diseases of the blood and blood-forming organs and certain disorders involving the immune mechanism: Secondary | ICD-10-CM | POA: Diagnosis not present

## 2020-12-15 DIAGNOSIS — N189 Chronic kidney disease, unspecified: Secondary | ICD-10-CM | POA: Diagnosis not present

## 2020-12-15 LAB — RENAL FUNCTION PANEL
Albumin: 3.3 g/dL — ABNORMAL LOW (ref 3.5–5.0)
Anion gap: 13 (ref 5–15)
BUN: 74 mg/dL — ABNORMAL HIGH (ref 8–23)
CO2: 22 mmol/L (ref 22–32)
Calcium: 9.8 mg/dL (ref 8.9–10.3)
Chloride: 102 mmol/L (ref 98–111)
Creatinine, Ser: 5.37 mg/dL — ABNORMAL HIGH (ref 0.61–1.24)
GFR, Estimated: 10 mL/min — ABNORMAL LOW (ref >60)
Glucose, Bld: 139 mg/dL — ABNORMAL HIGH (ref 70–99)
Phosphorus: 6.3 mg/dL — ABNORMAL HIGH (ref 2.5–4.6)
Potassium: 3.7 mmol/L (ref 3.5–5.1)
Sodium: 137 mmol/L (ref 135–145)

## 2020-12-15 LAB — POCT HEMOGLOBIN-HEMACUE: Hemoglobin: 11.9 g/dL — ABNORMAL LOW (ref 13.0–17.0)

## 2020-12-15 MED ORDER — EPOETIN ALFA-EPBX 40000 UNIT/ML IJ SOLN
30000.0000 [IU] | INTRAMUSCULAR | Status: DC
Start: 2020-12-15 — End: 2020-12-16
  Administered 2020-12-15: 30000 [IU] via SUBCUTANEOUS

## 2020-12-15 MED ORDER — EPOETIN ALFA-EPBX 40000 UNIT/ML IJ SOLN
INTRAMUSCULAR | Status: AC
  Filled 2020-12-15: qty 1

## 2020-12-16 LAB — PTH, INTACT AND CALCIUM
Calcium, Total (PTH): 9.6 mg/dL (ref 8.6–10.2)
PTH: 71 pg/mL — ABNORMAL HIGH (ref 15–65)

## 2020-12-24 ENCOUNTER — Other Ambulatory Visit: Payer: Self-pay

## 2020-12-24 ENCOUNTER — Encounter: Payer: Self-pay | Admitting: Cardiovascular Disease

## 2020-12-24 ENCOUNTER — Ambulatory Visit: Payer: Medicare HMO | Admitting: Cardiovascular Disease

## 2020-12-24 DIAGNOSIS — I5032 Chronic diastolic (congestive) heart failure: Secondary | ICD-10-CM | POA: Diagnosis not present

## 2020-12-24 DIAGNOSIS — G4733 Obstructive sleep apnea (adult) (pediatric): Secondary | ICD-10-CM

## 2020-12-24 DIAGNOSIS — I10 Essential (primary) hypertension: Secondary | ICD-10-CM | POA: Diagnosis not present

## 2020-12-24 NOTE — Assessment & Plan Note (Signed)
History of hyperlipidemia on statin therapy with lipid profile performed 07/21/2020 revealing total cholesterol of 128, LDL of 84 and HDL of 35.

## 2020-12-24 NOTE — Assessment & Plan Note (Signed)
History of essential hypertension a blood pressure measured today 150/62.  He is on carvedilol.

## 2020-12-24 NOTE — Progress Notes (Signed)
12/24/2020 David Nguyen   09-11-1941  [id number]  Primary Physician [name], MD Primary Cardiologist: Lorretta Harp MD [name], [place]  HPI:  David Nguyen is a 80 y.o.  severely overweight married African-American male father of 56 children, grandfather of 5 grandchildren who still does long-term care in the mornings. His primary care physician is Dr. Dagmar Hait.  I last saw him in the office 12/30/2019.  He did see Dr. Davina Poke in the office 12/05/2019.He was admitted to [facility] 03/31/2019 with bradycardia and discharged 5 days later. During his hospitalization 2D echo revealed normal LV systolic function, diastolic dysfunction and moderate to severe pulmonary hypertension. He does have stage III CKD with serum creatinine in the low 3 range though not yet on dialysis. His other problems include treated hypertension, diabetes and hyperlipidemia. His beta-blockers were adjusted, initially held and then reinstituted at a lower dose.  When  Since I saw him in the office a year ago he continues to do well.  He did have an AV fistula placed prophylactically but has yet to go on hemodialysis.  He denies chest pain or shortness of breath.    Current Meds  Medication Sig  . ACCU-CHEK GUIDE test strip   . acetaminophen (TYLENOL) 650 MG CR tablet Take 650 mg by mouth daily as needed for pain.   Marland Kitchen allopurinol (ZYLOPRIM) 100 MG tablet Take 200 mg by mouth daily.   Marland Kitchen amLODipine (NORVASC) 10 MG tablet Take 10 mg by mouth daily.  . Ascorbic Acid (VITAMIN C) 1000 MG tablet Take 1,000 mg by mouth daily.  Marland Kitchen aspirin EC 81 MG tablet Take 81 mg by mouth daily.  Marland Kitchen atorvastatin (LIPITOR) 40 MG tablet Take 40 mg by mouth at bedtime.   . calcitRIOL (ROCALTROL) 0.25 MCG capsule Take 0.25 mcg by mouth daily.   . carvedilol (COREG) 3.125 MG tablet Take 1 tablet (3.125 mg total) by mouth 2 (two) times daily with a meal.  . doxazosin (CARDURA) 8 MG tablet Take 8 mg by mouth at  bedtime.  . DROPLET PEN NEEDLES 31G X 8 MM MISC   . fluticasone (FLONASE) 50 MCG/ACT nasal spray Place 2 sprays into both nostrils daily as needed for allergies.   . furosemide (LASIX) 80 MG tablet Take 1 tablet (80 mg total) by mouth 2 (two) times daily.  . Homeopathic Products [facility] ALLERGY EYE RELIEF) SOLN Place 1-2 drops into both eyes daily as needed (for dry eyes).  . insulin NPH-regular Human (70-30) 100 UNIT/ML injection Inject 16-18 Units into the skin See admin instructions. Inject 18 units in the morning and 16 units at night  . levothyroxine (SYNTHROID, LEVOTHROID) 75 MCG tablet Take 75 mcg by mouth daily before breakfast.  . loratadine (CLARITIN) 10 MG tablet Take 10 mg by mouth daily as needed for allergies.   . montelukast (SINGULAIR) 10 MG tablet Take 10 mg by mouth daily.  . Multiple Vitamin (MULTIVITAMIN WITH MINERALS) TABS tablet Take 1 tablet by mouth daily.  Marland Kitchen PRESCRIPTION MEDICATION Inhale into the lungs at bedtime. CPAP  . traMADol (ULTRAM) 50 MG tablet Take 1 tablet (50 mg total) by mouth every 6 (six) hours as needed for moderate pain or severe pain.  . Vitamin D3 (VITAMIN D) 25 MCG tablet Take 1,000 Units by mouth daily.     Allergies  Allergen Reactions  . Penicillins Rash and Other (See Comments)    Childhood allergy       Social  History   Socioeconomic History  . Marital status: Divorced    Spouse name: Not on file  . Number of children: 5  . Years of education: Not on file  . Highest education level: Not on file  Occupational History  . Occupation: retired    Comment: Landscape  Tobacco Use  . Smoking status: Former Smoker    Packs/day: 0.30    Years: 3.00    Pack years: 0.90    Types: Cigarettes    Quit date: 10/09/1968    Years since quitting: 52.2  . Smokeless tobacco: Never Used  Vaping Use  . Vaping Use: Never used  Substance and Sexual Activity  . Alcohol use: No  . Drug use: No  . Sexual activity: Not on file  Other Topics  Concern  . Not on file  Social History Narrative  . Not on file   Social Determinants of Health   Financial Resource Strain: Not on file  Food Insecurity: Not on file  Transportation Needs: No Transportation Needs  . Lack of Transportation (Medical): No  . Lack of Transportation (Non-Medical): No  Physical Activity: Not on file  Stress: Not on file  Social Connections: Not on file  Intimate Partner Violence: Not on file     Review of Systems: General: negative for chills, fever, night sweats or weight changes.  Cardiovascular: negative for chest pain, dyspnea on exertion, edema, orthopnea, palpitations, paroxysmal nocturnal dyspnea or shortness of breath Dermatological: negative for rash Respiratory: negative for cough or wheezing Urologic: negative for hematuria Abdominal: negative for nausea, vomiting, diarrhea, bright red blood per rectum, melena, or hematemesis Neurologic: negative for visual changes, syncope, or dizziness All other systems reviewed and are otherwise negative except as noted above.    Blood pressure (!) 150/62, pulse 67, height 6' (1.829 m), weight 257 lb 3.2 oz (116.7 kg), SpO2 97 %.  General appearance: alert and no distress Neck: no adenopathy, no carotid bruit, no JVD, supple, symmetrical, trachea midline and thyroid not enlarged, symmetric, no tenderness/mass/nodules Lungs: clear to auscultation bilaterally Heart: regular rate and rhythm, S1, S2 normal, no murmur, click, rub or gallop Extremities: extremities normal, atraumatic, no cyanosis or edema Pulses: 2+ and symmetric Skin: Skin color, texture, turgor normal. No rashes or lesions Neurologic: Alert and oriented X 3, normal strength and tone. Normal symmetric reflexes. Normal coordination and gait  EKG normal sinus rhythm at 67 with right bundle branch block and left anterior fascicular block.  I personally reviewed this EKG.  ASSESSMENT AND PLAN:   HYPERLIPIDEMIA History of hyperlipidemia on  statin therapy with lipid profile performed 07/21/2020 revealing total cholesterol of 128, LDL of 84 and HDL of 35.  Essential hypertension History of essential hypertension a blood pressure measured today 150/62.  He is on carvedilol.  Sleep apnea History of severe obstructive sleep apnea on CPAP  Chronic diastolic CHF (congestive heart failure) (HCC) History of chronic diastolic heart failure on high-dose furosemide.  He does have chronic renal insufficiency as well.      Lorretta Harp MD FACP,FACC,FAHA, [facility] 12/24/2020 3:39 PM

## 2020-12-24 NOTE — Assessment & Plan Note (Signed)
History of severe obstructive sleep apnea on CPAP

## 2020-12-24 NOTE — Patient Instructions (Signed)
Medication Instructions:  Your physician recommends that you continue on your current medications as directed. Please refer to the Current Medication list given to you today.  *If you need a refill on your cardiac medications before your next appointment, please call your pharmacy*   Follow-Up: At [facility], you and your health needs are our priority.  As part of our continuing mission to provide you with exceptional heart care, we have created designated Provider Care Teams.  These Care Teams include your primary Cardiologist (physician) and Advanced Practice Providers (APPs -  Physician Assistants and Nurse Practitioners) who all work together to provide you with the care you need, when you need it.  We recommend signing up for the patient portal called "MyChart".  Sign up information is provided on this After Visit Summary.  MyChart is used to connect with patients for Virtual Visits (Telemedicine).  Patients are able to view lab/test results, encounter notes, upcoming appointments, etc.  Non-urgent messages can be sent to your provider as well.   To learn more about what you can do with MyChart, go to [email or web address].    Your next appointment:   6 month(s)  The format for your next appointment:   In Person  Provider:   You will see one of the following Advanced Practice Providers on your designated Care Team:    Callie Goodrich, PA-C  Jesse Cleaver, FNP  Then, Jonathan Berry, MD will plan to see you again in 12 month(s). 

## 2020-12-24 NOTE — Assessment & Plan Note (Signed)
History of chronic diastolic heart failure on high-dose furosemide.  He does have chronic renal insufficiency as well.

## 2020-12-28 DIAGNOSIS — E119 Type 2 diabetes mellitus without complications: Secondary | ICD-10-CM | POA: Diagnosis not present

## 2021-01-05 ENCOUNTER — Encounter (HOSPITAL_COMMUNITY)
Admission: RE | Admit: 2021-01-05 | Discharge: 2021-01-05 | Disposition: A | Discharge status: Home / Self Care | Payer: Medicare HMO | Source: Ambulatory Visit | Attending: Nephrology | Admitting: Nephrology

## 2021-01-05 ENCOUNTER — Other Ambulatory Visit: Payer: Self-pay

## 2021-01-05 VITALS — BP 157/61 | HR 70 | Temp 97.6°F | Resp 20

## 2021-01-05 DIAGNOSIS — Z862 Personal history of diseases of the blood and blood-forming organs and certain disorders involving the immune mechanism: Secondary | ICD-10-CM

## 2021-01-05 DIAGNOSIS — N189 Chronic kidney disease, unspecified: Secondary | ICD-10-CM | POA: Diagnosis not present

## 2021-01-05 LAB — IRON AND TIBC
Iron: 96 ug/dL (ref 45–182)
Saturation Ratios: 38 % (ref 17.9–39.5)
TIBC: 255 ug/dL (ref 250–450)
UIBC: 159 ug/dL

## 2021-01-05 LAB — POCT HEMOGLOBIN-HEMACUE: Hemoglobin: 11.6 g/dL — ABNORMAL LOW (ref 13.0–17.0)

## 2021-01-05 LAB — FERRITIN: Ferritin: 534 ng/mL — ABNORMAL HIGH (ref 24–336)

## 2021-01-05 MED ORDER — EPOETIN ALFA-EPBX 40000 UNIT/ML IJ SOLN
INTRAMUSCULAR | Status: AC
  Administered 2021-01-05: 30000 [IU] via SUBCUTANEOUS
  Filled 2021-01-05: qty 1

## 2021-01-05 MED ORDER — EPOETIN ALFA-EPBX 40000 UNIT/ML IJ SOLN
30000.0000 [IU] | INTRAMUSCULAR | Status: DC
Start: 2021-01-05 — End: 2021-01-06

## 2021-01-12 ENCOUNTER — Other Ambulatory Visit: Payer: Self-pay

## 2021-01-12 ENCOUNTER — Ambulatory Visit: Payer: Medicare HMO | Admitting: Pulmonary Disease

## 2021-01-12 ENCOUNTER — Encounter: Payer: Self-pay | Admitting: Pulmonary Disease

## 2021-01-12 DIAGNOSIS — G4733 Obstructive sleep apnea (adult) (pediatric): Secondary | ICD-10-CM | POA: Diagnosis not present

## 2021-01-12 DIAGNOSIS — J301 Allergic rhinitis due to pollen: Secondary | ICD-10-CM | POA: Diagnosis not present

## 2021-01-12 DIAGNOSIS — Z9989 Dependence on other enabling machines and devices: Secondary | ICD-10-CM | POA: Diagnosis not present

## 2021-01-12 NOTE — Patient Instructions (Signed)
  Tighten CPAP straps so that you do not have an air leak. Change to auto CPAP settings 12 to 18 cm and check download in 1 month

## 2021-01-12 NOTE — Progress Notes (Signed)
   Subjective:    Patient ID: David Nguyen, male    DOB: 1941-08-28, 80 y.o.   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   He has been maintained on CPAP for several years, last visit we changed him to a full facemask.  He wonders if the pressure needs to be increased, wife has noted increase snoring.  He reports nonrefreshing sleep. No dryness, He arrives in a wheelchair, reports there is inflammation in his left leg, is wearing support hose   Significant tests/ events reviewed  PSG 04/2008 >>severe obstructive sleep apnea with hypopneas , AHI 63/h, lowest desatn of 78%, longest event 52s. nocturnal cough noted. Improved with CPAP 15 cm    Review of Systems neg for any significant sore throat, dysphagia, itching, sneezing, nasal congestion or excess/ purulent secretions, fever, chills, sweats, unintended wt loss, pleuritic or exertional cp, hempoptysis, orthopnea pnd or change in chronic leg swelling. Also denies presyncope, palpitations, heartburn, abdominal pain, nausea, vomiting, diarrhea or change in bowel or urinary habits, dysuria,hematuria, rash, arthralgias, visual complaints, headache, numbness weakness or ataxia.     Objective:   Physical Exam  Gen. Pleasant, obese, in no distress ENT - no lesions, no post nasal drip Neck: No JVD, no thyromegaly, no carotid bruits Lungs: no use of accessory muscles, no dullness to percussion, decreased without rales or rhonchi  Cardiovascular: Rhythm regular, heart sounds  normal, no murmurs or gallops, no peripheral edema Musculoskeletal: No deformities, no cyanosis or clubbing , no tremors       Assessment & Plan:

## 2021-01-12 NOTE — Assessment & Plan Note (Signed)
Continue Flonase each nare at bedtime

## 2021-01-12 NOTE — Assessment & Plan Note (Signed)
Download was reviewed which shows large leak and residual AHI of 26/hour.  His downloads over the last 2 years seem to show good control of events so I think the leak that is causing inadequate control now. I have asked him to Tighten CPAP straps so that you do not have an air leak. Change to auto CPAP settings 12 to 18 cm and check download in 1 month -his wife has noted residual snoring and he feels like he needs more pressure so we will increase maximum pressure to 18 cm  Weight loss encouraged, compliance with goal of at least 4-6 hrs every night is the expectation. Advised against medications with sedative side effects Cautioned against driving when sleepy - understanding that sleepiness will vary on a day to day basis

## 2021-01-12 NOTE — Addendum Note (Signed)
Addended by: Birder Robson on: 01/12/2021 01:32 PM   Modules accepted: Orders

## 2021-01-14 DIAGNOSIS — G4733 Obstructive sleep apnea (adult) (pediatric): Secondary | ICD-10-CM | POA: Diagnosis not present

## 2021-01-18 DIAGNOSIS — Q600 Renal agenesis, unilateral: Secondary | ICD-10-CM | POA: Diagnosis not present

## 2021-01-18 DIAGNOSIS — I120 Hypertensive chronic kidney disease with stage 5 chronic kidney disease or end stage renal disease: Secondary | ICD-10-CM | POA: Diagnosis not present

## 2021-01-18 DIAGNOSIS — N2581 Secondary hyperparathyroidism of renal origin: Secondary | ICD-10-CM | POA: Diagnosis not present

## 2021-01-18 DIAGNOSIS — D631 Anemia in chronic kidney disease: Secondary | ICD-10-CM | POA: Diagnosis not present

## 2021-01-18 DIAGNOSIS — R609 Edema, unspecified: Secondary | ICD-10-CM | POA: Diagnosis not present

## 2021-01-18 DIAGNOSIS — N185 Chronic kidney disease, stage 5: Secondary | ICD-10-CM | POA: Diagnosis not present

## 2021-01-26 ENCOUNTER — Other Ambulatory Visit: Payer: Self-pay

## 2021-01-26 ENCOUNTER — Ambulatory Visit (HOSPITAL_COMMUNITY)
Admission: RE | Admit: 2021-01-26 | Discharge: 2021-01-26 | Disposition: A | Discharge status: Home / Self Care | Payer: Medicare HMO | Source: Ambulatory Visit | Attending: Nephrology | Admitting: Nephrology

## 2021-01-26 VITALS — BP 150/63 | HR 72 | Temp 97.4°F | Resp 20

## 2021-01-26 DIAGNOSIS — N189 Chronic kidney disease, unspecified: Secondary | ICD-10-CM | POA: Insufficient documentation

## 2021-01-26 DIAGNOSIS — Z862 Personal history of diseases of the blood and blood-forming organs and certain disorders involving the immune mechanism: Secondary | ICD-10-CM | POA: Insufficient documentation

## 2021-01-26 LAB — IRON AND TIBC
Iron: 92 ug/dL (ref 45–182)
Saturation Ratios: 36 % (ref 17.9–39.5)
TIBC: 258 ug/dL (ref 250–450)
UIBC: 166 ug/dL

## 2021-01-26 LAB — FERRITIN: Ferritin: 511 ng/mL — ABNORMAL HIGH (ref 24–336)

## 2021-01-26 LAB — POCT HEMOGLOBIN-HEMACUE: Hemoglobin: 11.8 g/dL — ABNORMAL LOW (ref 13.0–17.0)

## 2021-01-26 MED ORDER — EPOETIN ALFA-EPBX 40000 UNIT/ML IJ SOLN
30000.0000 [IU] | INTRAMUSCULAR | Status: DC
  Administered 2021-01-26: 30000 [IU] via SUBCUTANEOUS

## 2021-01-26 MED ORDER — EPOETIN ALFA-EPBX 40000 UNIT/ML IJ SOLN
INTRAMUSCULAR | Status: AC
  Filled 2021-01-26: qty 1

## 2021-02-07 ENCOUNTER — Ambulatory Visit: Payer: Medicare HMO | Admitting: Podiatry

## 2021-02-07 ENCOUNTER — Encounter: Payer: Self-pay | Admitting: Podiatry

## 2021-02-07 ENCOUNTER — Other Ambulatory Visit: Payer: Self-pay

## 2021-02-07 DIAGNOSIS — M79674 Pain in right toe(s): Secondary | ICD-10-CM | POA: Diagnosis not present

## 2021-02-07 DIAGNOSIS — E1122 Type 2 diabetes mellitus with diabetic chronic kidney disease: Secondary | ICD-10-CM

## 2021-02-07 DIAGNOSIS — B351 Tinea unguium: Secondary | ICD-10-CM | POA: Diagnosis not present

## 2021-02-07 DIAGNOSIS — M79675 Pain in left toe(s): Secondary | ICD-10-CM

## 2021-02-07 DIAGNOSIS — N184 Chronic kidney disease, stage 4 (severe): Secondary | ICD-10-CM

## 2021-02-07 DIAGNOSIS — E118 Type 2 diabetes mellitus with unspecified complications: Secondary | ICD-10-CM | POA: Diagnosis not present

## 2021-02-07 NOTE — Progress Notes (Signed)
This patient returns to my office for at risk foot care.  This patient requires this care by a professional since this patient will be at risk due to having CKD, and type 2 diabetes  This patient is unable to cut nails himself since the patient cannot reach his nails.These nails are painful walking and wearing shoes.  This patient presents for at risk foot care today.  General Appearance  Alert, conversant and in no acute stress.  Vascular  Dorsalis pedis and posterior tibial  pulses are weakly palpable  bilaterally.  Capillary return is within normal limits  bilaterally. Temperature is within normal limits  bilaterally.  Neurologic  Senn-Weinstein monofilament wire test within normal limits  bilaterally. Muscle power within normal limits bilaterally.  Nails Thick disfigured discolored nails with subungual debris  from hallux to fifth toes bilaterally. No evidence of bacterial infection or drainage bilaterally.  Orthopedic  No limitations of motion  feet .  No crepitus or effusions noted.  No bony pathology or digital deformities noted. HAV  B/L.  Skin  normotropic skin with no porokeratosis noted bilaterally.  No signs of infections or ulcers noted.     Onychomycosis  Pain in right toes  Pain in left toes  Consent was obtained for treatment procedures.   Mechanical debridement of nails 1-5  bilaterally performed with a nail nipper.  Filed with dremel without incident.    Return office visit    3 months                 Told patient to return for periodic foot care and evaluation due to potential at risk complications.   Deonna Krummel DPM  

## 2021-02-16 ENCOUNTER — Ambulatory Visit (HOSPITAL_COMMUNITY)
Admission: RE | Admit: 2021-02-16 | Discharge: 2021-02-16 | Disposition: A | Discharge status: Home / Self Care | Payer: Medicare HMO | Source: Ambulatory Visit | Attending: Nephrology | Admitting: Nephrology

## 2021-02-16 ENCOUNTER — Other Ambulatory Visit: Payer: Self-pay

## 2021-02-16 VITALS — BP 144/57 | HR 74 | Temp 97.4°F

## 2021-02-16 DIAGNOSIS — Z862 Personal history of diseases of the blood and blood-forming organs and certain disorders involving the immune mechanism: Secondary | ICD-10-CM | POA: Insufficient documentation

## 2021-02-16 DIAGNOSIS — N189 Chronic kidney disease, unspecified: Secondary | ICD-10-CM | POA: Diagnosis not present

## 2021-02-16 LAB — IRON AND TIBC
Iron: 72 ug/dL (ref 45–182)
Saturation Ratios: 30 % (ref 17.9–39.5)
TIBC: 241 ug/dL — ABNORMAL LOW (ref 250–450)
UIBC: 169 ug/dL

## 2021-02-16 LAB — FERRITIN: Ferritin: 488 ng/mL — ABNORMAL HIGH (ref 24–336)

## 2021-02-16 MED ORDER — EPOETIN ALFA-EPBX 40000 UNIT/ML IJ SOLN
30000.0000 [IU] | INTRAMUSCULAR | Status: DC
Start: 2021-02-16 — End: 2021-02-17

## 2021-02-23 LAB — POCT HEMOGLOBIN-HEMACUE: Hemoglobin: 12.2 g/dL — ABNORMAL LOW (ref 13.0–17.0)

## 2021-03-02 ENCOUNTER — Other Ambulatory Visit: Payer: Self-pay

## 2021-03-02 ENCOUNTER — Encounter (HOSPITAL_COMMUNITY)
Admission: RE | Admit: 2021-03-02 | Discharge: 2021-03-02 | Disposition: A | Discharge status: Home / Self Care | Payer: Medicare HMO | Source: Ambulatory Visit | Attending: Nephrology | Admitting: Nephrology

## 2021-03-02 VITALS — BP 142/63 | HR 69 | Temp 97.4°F | Resp 20

## 2021-03-02 DIAGNOSIS — Z862 Personal history of diseases of the blood and blood-forming organs and certain disorders involving the immune mechanism: Secondary | ICD-10-CM | POA: Insufficient documentation

## 2021-03-02 DIAGNOSIS — N189 Chronic kidney disease, unspecified: Secondary | ICD-10-CM | POA: Insufficient documentation

## 2021-03-02 LAB — POCT HEMOGLOBIN-HEMACUE: Hemoglobin: 11.9 g/dL — ABNORMAL LOW (ref 13.0–17.0)

## 2021-03-02 MED ORDER — EPOETIN ALFA-EPBX 40000 UNIT/ML IJ SOLN
30000.0000 [IU] | INTRAMUSCULAR | Status: DC
  Administered 2021-03-02: 30000 [IU] via SUBCUTANEOUS

## 2021-03-02 MED ORDER — EPOETIN ALFA-EPBX 40000 UNIT/ML IJ SOLN
INTRAMUSCULAR | Status: AC
  Filled 2021-03-02: qty 1

## 2021-03-09 ENCOUNTER — Encounter (HOSPITAL_COMMUNITY): Payer: Medicare HMO

## 2021-03-23 ENCOUNTER — Other Ambulatory Visit: Payer: Self-pay

## 2021-03-23 ENCOUNTER — Ambulatory Visit (HOSPITAL_COMMUNITY)
Admission: RE | Admit: 2021-03-23 | Discharge: 2021-03-23 | Disposition: A | Discharge status: Home / Self Care | Payer: Medicare HMO | Source: Ambulatory Visit | Attending: Nephrology | Admitting: Nephrology

## 2021-03-23 VITALS — BP 170/66 | HR 66 | Temp 97.5°F | Resp 20

## 2021-03-23 DIAGNOSIS — N189 Chronic kidney disease, unspecified: Secondary | ICD-10-CM | POA: Insufficient documentation

## 2021-03-23 DIAGNOSIS — Z862 Personal history of diseases of the blood and blood-forming organs and certain disorders involving the immune mechanism: Secondary | ICD-10-CM | POA: Insufficient documentation

## 2021-03-23 LAB — IRON AND TIBC
Iron: 87 ug/dL (ref 45–182)
Saturation Ratios: 37 % (ref 17.9–39.5)
TIBC: 238 ug/dL — ABNORMAL LOW (ref 250–450)
UIBC: 151 ug/dL

## 2021-03-23 LAB — RENAL FUNCTION PANEL
Albumin: 3 g/dL — ABNORMAL LOW (ref 3.5–5.0)
Anion gap: 8 (ref 5–15)
BUN: 72 mg/dL — ABNORMAL HIGH (ref 8–23)
CO2: 27 mmol/L (ref 22–32)
Calcium: 9.8 mg/dL (ref 8.9–10.3)
Chloride: 103 mmol/L (ref 98–111)
Creatinine, Ser: 5.51 mg/dL — ABNORMAL HIGH (ref 0.61–1.24)
GFR, Estimated: 10 mL/min — ABNORMAL LOW (ref >60)
Glucose, Bld: 160 mg/dL — ABNORMAL HIGH (ref 70–99)
Phosphorus: 5.1 mg/dL — ABNORMAL HIGH (ref 2.5–4.6)
Potassium: 3.7 mmol/L (ref 3.5–5.1)
Sodium: 138 mmol/L (ref 135–145)

## 2021-03-23 LAB — FERRITIN: Ferritin: 462 ng/mL — ABNORMAL HIGH (ref 24–336)

## 2021-03-23 LAB — POCT HEMOGLOBIN-HEMACUE: Hemoglobin: 11.6 g/dL — ABNORMAL LOW (ref 13.0–17.0)

## 2021-03-23 MED ORDER — EPOETIN ALFA-EPBX 40000 UNIT/ML IJ SOLN
30000.0000 [IU] | INTRAMUSCULAR | Status: DC
  Administered 2021-03-23: 30000 [IU] via SUBCUTANEOUS

## 2021-03-23 MED ORDER — EPOETIN ALFA-EPBX 40000 UNIT/ML IJ SOLN
INTRAMUSCULAR | Status: AC
  Filled 2021-03-23: qty 1

## 2021-03-24 LAB — PTH, INTACT AND CALCIUM
Calcium, Total (PTH): 9.8 mg/dL (ref 8.6–10.2)
PTH: 89 pg/mL — ABNORMAL HIGH (ref 15–65)

## 2021-04-12 DIAGNOSIS — N185 Chronic kidney disease, stage 5: Secondary | ICD-10-CM | POA: Diagnosis not present

## 2021-04-12 DIAGNOSIS — R609 Edema, unspecified: Secondary | ICD-10-CM | POA: Diagnosis not present

## 2021-04-12 DIAGNOSIS — I120 Hypertensive chronic kidney disease with stage 5 chronic kidney disease or end stage renal disease: Secondary | ICD-10-CM | POA: Diagnosis not present

## 2021-04-12 DIAGNOSIS — D631 Anemia in chronic kidney disease: Secondary | ICD-10-CM | POA: Diagnosis not present

## 2021-04-12 DIAGNOSIS — I770 Arteriovenous fistula, acquired: Secondary | ICD-10-CM | POA: Diagnosis not present

## 2021-04-12 DIAGNOSIS — N2581 Secondary hyperparathyroidism of renal origin: Secondary | ICD-10-CM | POA: Diagnosis not present

## 2021-04-13 ENCOUNTER — Other Ambulatory Visit: Payer: Self-pay

## 2021-04-13 ENCOUNTER — Encounter (HOSPITAL_COMMUNITY)
Admission: RE | Admit: 2021-04-13 | Discharge: 2021-04-13 | Disposition: A | Discharge status: Home / Self Care | Payer: Medicare HMO | Source: Ambulatory Visit | Attending: Nephrology | Admitting: Nephrology

## 2021-04-13 VITALS — BP 123/67 | HR 70 | Temp 97.8°F | Resp 20 | Ht 72.0 in | Wt 240.0 lb

## 2021-04-13 DIAGNOSIS — N189 Chronic kidney disease, unspecified: Secondary | ICD-10-CM | POA: Insufficient documentation

## 2021-04-13 DIAGNOSIS — Z862 Personal history of diseases of the blood and blood-forming organs and certain disorders involving the immune mechanism: Secondary | ICD-10-CM | POA: Insufficient documentation

## 2021-04-13 LAB — POCT HEMOGLOBIN-HEMACUE: Hemoglobin: 11.9 g/dL — ABNORMAL LOW (ref 13.0–17.0)

## 2021-04-13 MED ORDER — EPOETIN ALFA-EPBX 40000 UNIT/ML IJ SOLN: 30000.0000 [IU] | INTRAMUSCULAR | Status: DC

## 2021-04-13 MED ORDER — EPOETIN ALFA-EPBX 40000 UNIT/ML IJ SOLN
INTRAMUSCULAR | Status: AC
  Administered 2021-04-13: 30000 [IU] via SUBCUTANEOUS
  Filled 2021-04-13: qty 1

## 2021-04-21 DIAGNOSIS — I871 Compression of vein: Secondary | ICD-10-CM | POA: Diagnosis not present

## 2021-04-21 DIAGNOSIS — T82898A Other specified complication of vascular prosthetic devices, implants and grafts, initial encounter: Secondary | ICD-10-CM | POA: Diagnosis not present

## 2021-04-21 DIAGNOSIS — Z992 Dependence on renal dialysis: Secondary | ICD-10-CM | POA: Diagnosis not present

## 2021-04-21 DIAGNOSIS — N186 End stage renal disease: Secondary | ICD-10-CM | POA: Diagnosis not present

## 2021-04-22 DIAGNOSIS — N186 End stage renal disease: Secondary | ICD-10-CM | POA: Diagnosis not present

## 2021-04-22 DIAGNOSIS — N2581 Secondary hyperparathyroidism of renal origin: Secondary | ICD-10-CM | POA: Diagnosis not present

## 2021-04-22 DIAGNOSIS — Z992 Dependence on renal dialysis: Secondary | ICD-10-CM | POA: Diagnosis not present

## 2021-04-25 DIAGNOSIS — Z992 Dependence on renal dialysis: Secondary | ICD-10-CM | POA: Diagnosis not present

## 2021-04-25 DIAGNOSIS — N2581 Secondary hyperparathyroidism of renal origin: Secondary | ICD-10-CM | POA: Diagnosis not present

## 2021-04-25 DIAGNOSIS — N186 End stage renal disease: Secondary | ICD-10-CM | POA: Diagnosis not present

## 2021-04-27 DIAGNOSIS — Z992 Dependence on renal dialysis: Secondary | ICD-10-CM | POA: Diagnosis not present

## 2021-04-27 DIAGNOSIS — N186 End stage renal disease: Secondary | ICD-10-CM | POA: Diagnosis not present

## 2021-04-27 DIAGNOSIS — N2581 Secondary hyperparathyroidism of renal origin: Secondary | ICD-10-CM | POA: Diagnosis not present

## 2021-04-29 DIAGNOSIS — N186 End stage renal disease: Secondary | ICD-10-CM | POA: Diagnosis not present

## 2021-04-29 DIAGNOSIS — Z992 Dependence on renal dialysis: Secondary | ICD-10-CM | POA: Diagnosis not present

## 2021-04-29 DIAGNOSIS — N2581 Secondary hyperparathyroidism of renal origin: Secondary | ICD-10-CM | POA: Diagnosis not present

## 2021-05-02 DIAGNOSIS — Z992 Dependence on renal dialysis: Secondary | ICD-10-CM | POA: Diagnosis not present

## 2021-05-02 DIAGNOSIS — N186 End stage renal disease: Secondary | ICD-10-CM | POA: Diagnosis not present

## 2021-05-02 DIAGNOSIS — N2581 Secondary hyperparathyroidism of renal origin: Secondary | ICD-10-CM | POA: Diagnosis not present

## 2021-05-04 ENCOUNTER — Encounter (HOSPITAL_COMMUNITY): Payer: Medicare HMO

## 2021-05-04 DIAGNOSIS — Z992 Dependence on renal dialysis: Secondary | ICD-10-CM | POA: Diagnosis not present

## 2021-05-04 DIAGNOSIS — N2581 Secondary hyperparathyroidism of renal origin: Secondary | ICD-10-CM | POA: Diagnosis not present

## 2021-05-04 DIAGNOSIS — N186 End stage renal disease: Secondary | ICD-10-CM | POA: Diagnosis not present

## 2021-05-05 DIAGNOSIS — E1129 Type 2 diabetes mellitus with other diabetic kidney complication: Secondary | ICD-10-CM | POA: Diagnosis not present

## 2021-05-05 DIAGNOSIS — M79605 Pain in left leg: Secondary | ICD-10-CM | POA: Diagnosis not present

## 2021-05-05 DIAGNOSIS — I130 Hypertensive heart and chronic kidney disease with heart failure and stage 1 through stage 4 chronic kidney disease, or unspecified chronic kidney disease: Secondary | ICD-10-CM | POA: Diagnosis not present

## 2021-05-05 DIAGNOSIS — I5032 Chronic diastolic (congestive) heart failure: Secondary | ICD-10-CM | POA: Diagnosis not present

## 2021-05-05 DIAGNOSIS — N184 Chronic kidney disease, stage 4 (severe): Secondary | ICD-10-CM | POA: Diagnosis not present

## 2021-05-05 DIAGNOSIS — Z992 Dependence on renal dialysis: Secondary | ICD-10-CM | POA: Diagnosis not present

## 2021-05-05 DIAGNOSIS — N186 End stage renal disease: Secondary | ICD-10-CM | POA: Diagnosis not present

## 2021-05-05 DIAGNOSIS — I83812 Varicose veins of left lower extremities with pain: Secondary | ICD-10-CM | POA: Diagnosis not present

## 2021-05-05 DIAGNOSIS — I872 Venous insufficiency (chronic) (peripheral): Secondary | ICD-10-CM | POA: Diagnosis not present

## 2021-05-06 ENCOUNTER — Inpatient Hospital Stay (HOSPITAL_COMMUNITY): Admission: RE | Admit: 2021-05-06 | Payer: Medicare HMO | Source: Ambulatory Visit

## 2021-05-06 ENCOUNTER — Other Ambulatory Visit (HOSPITAL_COMMUNITY): Payer: Self-pay | Admitting: Family Medicine

## 2021-05-06 DIAGNOSIS — M79605 Pain in left leg: Secondary | ICD-10-CM

## 2021-05-06 DIAGNOSIS — Z992 Dependence on renal dialysis: Secondary | ICD-10-CM | POA: Diagnosis not present

## 2021-05-06 DIAGNOSIS — N186 End stage renal disease: Secondary | ICD-10-CM | POA: Diagnosis not present

## 2021-05-06 DIAGNOSIS — N2581 Secondary hyperparathyroidism of renal origin: Secondary | ICD-10-CM | POA: Diagnosis not present

## 2021-05-08 DIAGNOSIS — N186 End stage renal disease: Secondary | ICD-10-CM | POA: Diagnosis not present

## 2021-05-08 DIAGNOSIS — E1122 Type 2 diabetes mellitus with diabetic chronic kidney disease: Secondary | ICD-10-CM | POA: Diagnosis not present

## 2021-05-08 DIAGNOSIS — Z992 Dependence on renal dialysis: Secondary | ICD-10-CM | POA: Diagnosis not present

## 2021-05-09 DIAGNOSIS — N2581 Secondary hyperparathyroidism of renal origin: Secondary | ICD-10-CM | POA: Diagnosis not present

## 2021-05-09 DIAGNOSIS — Z992 Dependence on renal dialysis: Secondary | ICD-10-CM | POA: Diagnosis not present

## 2021-05-09 DIAGNOSIS — N186 End stage renal disease: Secondary | ICD-10-CM | POA: Diagnosis not present

## 2021-05-10 ENCOUNTER — Ambulatory Visit (HOSPITAL_COMMUNITY)
Admission: RE | Admit: 2021-05-10 | Discharge: 2021-05-10 | Disposition: A | Discharge status: Home / Self Care | Payer: Medicare HMO | Source: Ambulatory Visit | Attending: Family Medicine | Admitting: Family Medicine

## 2021-05-10 ENCOUNTER — Ambulatory Visit (INDEPENDENT_AMBULATORY_CARE_PROVIDER_SITE_OTHER): Payer: Medicare HMO | Admitting: Podiatry

## 2021-05-10 ENCOUNTER — Other Ambulatory Visit: Payer: Self-pay

## 2021-05-10 ENCOUNTER — Encounter: Payer: Self-pay | Admitting: Podiatry

## 2021-05-10 DIAGNOSIS — N184 Chronic kidney disease, stage 4 (severe): Secondary | ICD-10-CM

## 2021-05-10 DIAGNOSIS — B351 Tinea unguium: Secondary | ICD-10-CM | POA: Diagnosis not present

## 2021-05-10 DIAGNOSIS — M79605 Pain in left leg: Secondary | ICD-10-CM | POA: Diagnosis not present

## 2021-05-10 DIAGNOSIS — M79675 Pain in left toe(s): Secondary | ICD-10-CM | POA: Diagnosis not present

## 2021-05-10 DIAGNOSIS — E1122 Type 2 diabetes mellitus with diabetic chronic kidney disease: Secondary | ICD-10-CM

## 2021-05-10 DIAGNOSIS — E118 Type 2 diabetes mellitus with unspecified complications: Secondary | ICD-10-CM

## 2021-05-10 DIAGNOSIS — M79674 Pain in right toe(s): Secondary | ICD-10-CM | POA: Diagnosis not present

## 2021-05-10 NOTE — Progress Notes (Addendum)
This patient returns to my office for at risk foot care.  This patient requires this care by a professional since this patient will be at risk due to having CKD, and type 2 diabetes  This patient is unable to cut nails himself since the patient cannot reach his nails.These nails are painful walking and wearing shoes.  This patient presents for at risk foot care today.  General Appearance  Alert, conversant and in no acute stress.  Vascular  Dorsalis pedis and posterior tibial  pulses are weakly palpable  bilaterally.  Capillary return is within normal limits  bilaterally. Temperature is within normal limits  bilaterally.  Neurologic  Senn-Weinstein monofilament wire test within normal limits  bilaterally. Muscle power within normal limits bilaterally.  Nails Thick disfigured discolored nails with subungual debris  from hallux to fifth toes bilaterally. No evidence of bacterial infection or drainage bilaterally.  Orthopedic  No limitations of motion  feet .  No crepitus or effusions noted.  No bony pathology or digital deformities noted. HAV  B/L.  Skin  normotropic skin with no porokeratosis noted bilaterally.  No signs of infections or ulcers noted.     Onychomycosis  Pain in right toes  Pain in left toes  Consent was obtained for treatment procedures.   Mechanical debridement of nails 1-5  bilaterally performed with a nail nipper.  Filed with dremel without incident.    Return office visit    3 months                 Told patient to return for periodic foot care and evaluation due to potential at risk complications.   Ouida Abeyta DPM  

## 2021-05-11 DIAGNOSIS — Z992 Dependence on renal dialysis: Secondary | ICD-10-CM | POA: Diagnosis not present

## 2021-05-11 DIAGNOSIS — N2581 Secondary hyperparathyroidism of renal origin: Secondary | ICD-10-CM | POA: Diagnosis not present

## 2021-05-11 DIAGNOSIS — N186 End stage renal disease: Secondary | ICD-10-CM | POA: Diagnosis not present

## 2021-05-13 DIAGNOSIS — N2581 Secondary hyperparathyroidism of renal origin: Secondary | ICD-10-CM | POA: Diagnosis not present

## 2021-05-13 DIAGNOSIS — N186 End stage renal disease: Secondary | ICD-10-CM | POA: Diagnosis not present

## 2021-05-13 DIAGNOSIS — Z992 Dependence on renal dialysis: Secondary | ICD-10-CM | POA: Diagnosis not present

## 2021-05-16 DIAGNOSIS — N186 End stage renal disease: Secondary | ICD-10-CM | POA: Diagnosis not present

## 2021-05-16 DIAGNOSIS — Z992 Dependence on renal dialysis: Secondary | ICD-10-CM | POA: Diagnosis not present

## 2021-05-16 DIAGNOSIS — N2581 Secondary hyperparathyroidism of renal origin: Secondary | ICD-10-CM | POA: Diagnosis not present

## 2021-05-18 DIAGNOSIS — Z992 Dependence on renal dialysis: Secondary | ICD-10-CM | POA: Diagnosis not present

## 2021-05-18 DIAGNOSIS — N186 End stage renal disease: Secondary | ICD-10-CM | POA: Diagnosis not present

## 2021-05-18 DIAGNOSIS — N2581 Secondary hyperparathyroidism of renal origin: Secondary | ICD-10-CM | POA: Diagnosis not present

## 2021-05-20 DIAGNOSIS — N2581 Secondary hyperparathyroidism of renal origin: Secondary | ICD-10-CM | POA: Diagnosis not present

## 2021-05-20 DIAGNOSIS — N186 End stage renal disease: Secondary | ICD-10-CM | POA: Diagnosis not present

## 2021-05-20 DIAGNOSIS — Z992 Dependence on renal dialysis: Secondary | ICD-10-CM | POA: Diagnosis not present

## 2021-05-23 DIAGNOSIS — N2581 Secondary hyperparathyroidism of renal origin: Secondary | ICD-10-CM | POA: Diagnosis not present

## 2021-05-23 DIAGNOSIS — Z992 Dependence on renal dialysis: Secondary | ICD-10-CM | POA: Diagnosis not present

## 2021-05-23 DIAGNOSIS — N186 End stage renal disease: Secondary | ICD-10-CM | POA: Diagnosis not present

## 2021-05-25 ENCOUNTER — Encounter (HOSPITAL_COMMUNITY): Payer: Medicare HMO

## 2021-05-25 DIAGNOSIS — N2581 Secondary hyperparathyroidism of renal origin: Secondary | ICD-10-CM | POA: Diagnosis not present

## 2021-05-25 DIAGNOSIS — Z992 Dependence on renal dialysis: Secondary | ICD-10-CM | POA: Diagnosis not present

## 2021-05-25 DIAGNOSIS — N186 End stage renal disease: Secondary | ICD-10-CM | POA: Diagnosis not present

## 2021-05-27 DIAGNOSIS — Z992 Dependence on renal dialysis: Secondary | ICD-10-CM | POA: Diagnosis not present

## 2021-05-27 DIAGNOSIS — N2581 Secondary hyperparathyroidism of renal origin: Secondary | ICD-10-CM | POA: Diagnosis not present

## 2021-05-27 DIAGNOSIS — N186 End stage renal disease: Secondary | ICD-10-CM | POA: Diagnosis not present

## 2021-05-30 DIAGNOSIS — Z992 Dependence on renal dialysis: Secondary | ICD-10-CM | POA: Diagnosis not present

## 2021-05-30 DIAGNOSIS — N186 End stage renal disease: Secondary | ICD-10-CM | POA: Diagnosis not present

## 2021-05-30 DIAGNOSIS — N2581 Secondary hyperparathyroidism of renal origin: Secondary | ICD-10-CM | POA: Diagnosis not present

## 2021-05-31 DIAGNOSIS — N186 End stage renal disease: Secondary | ICD-10-CM | POA: Diagnosis not present

## 2021-05-31 DIAGNOSIS — Z452 Encounter for adjustment and management of vascular access device: Secondary | ICD-10-CM | POA: Diagnosis not present

## 2021-05-31 DIAGNOSIS — Z992 Dependence on renal dialysis: Secondary | ICD-10-CM | POA: Diagnosis not present

## 2021-06-01 DIAGNOSIS — N186 End stage renal disease: Secondary | ICD-10-CM | POA: Diagnosis not present

## 2021-06-01 DIAGNOSIS — N2581 Secondary hyperparathyroidism of renal origin: Secondary | ICD-10-CM | POA: Diagnosis not present

## 2021-06-01 DIAGNOSIS — Z992 Dependence on renal dialysis: Secondary | ICD-10-CM | POA: Diagnosis not present

## 2021-06-03 DIAGNOSIS — N186 End stage renal disease: Secondary | ICD-10-CM | POA: Diagnosis not present

## 2021-06-03 DIAGNOSIS — N2581 Secondary hyperparathyroidism of renal origin: Secondary | ICD-10-CM | POA: Diagnosis not present

## 2021-06-03 DIAGNOSIS — Z992 Dependence on renal dialysis: Secondary | ICD-10-CM | POA: Diagnosis not present

## 2021-06-06 DIAGNOSIS — N2581 Secondary hyperparathyroidism of renal origin: Secondary | ICD-10-CM | POA: Diagnosis not present

## 2021-06-06 DIAGNOSIS — Z992 Dependence on renal dialysis: Secondary | ICD-10-CM | POA: Diagnosis not present

## 2021-06-06 DIAGNOSIS — N186 End stage renal disease: Secondary | ICD-10-CM | POA: Diagnosis not present

## 2021-06-08 DIAGNOSIS — N186 End stage renal disease: Secondary | ICD-10-CM | POA: Diagnosis not present

## 2021-06-08 DIAGNOSIS — E1122 Type 2 diabetes mellitus with diabetic chronic kidney disease: Secondary | ICD-10-CM | POA: Diagnosis not present

## 2021-06-08 DIAGNOSIS — Z992 Dependence on renal dialysis: Secondary | ICD-10-CM | POA: Diagnosis not present

## 2021-06-08 DIAGNOSIS — N2581 Secondary hyperparathyroidism of renal origin: Secondary | ICD-10-CM | POA: Diagnosis not present

## 2021-06-10 DIAGNOSIS — N186 End stage renal disease: Secondary | ICD-10-CM | POA: Diagnosis not present

## 2021-06-10 DIAGNOSIS — Z992 Dependence on renal dialysis: Secondary | ICD-10-CM | POA: Diagnosis not present

## 2021-06-10 DIAGNOSIS — N2581 Secondary hyperparathyroidism of renal origin: Secondary | ICD-10-CM | POA: Diagnosis not present

## 2021-06-13 DIAGNOSIS — N186 End stage renal disease: Secondary | ICD-10-CM | POA: Diagnosis not present

## 2021-06-13 DIAGNOSIS — Z992 Dependence on renal dialysis: Secondary | ICD-10-CM | POA: Diagnosis not present

## 2021-06-13 DIAGNOSIS — N2581 Secondary hyperparathyroidism of renal origin: Secondary | ICD-10-CM | POA: Diagnosis not present

## 2021-06-14 DIAGNOSIS — M25562 Pain in left knee: Secondary | ICD-10-CM | POA: Diagnosis not present

## 2021-06-15 DIAGNOSIS — N186 End stage renal disease: Secondary | ICD-10-CM | POA: Diagnosis not present

## 2021-06-15 DIAGNOSIS — Z992 Dependence on renal dialysis: Secondary | ICD-10-CM | POA: Diagnosis not present

## 2021-06-15 DIAGNOSIS — N2581 Secondary hyperparathyroidism of renal origin: Secondary | ICD-10-CM | POA: Diagnosis not present

## 2021-06-17 DIAGNOSIS — Z992 Dependence on renal dialysis: Secondary | ICD-10-CM | POA: Diagnosis not present

## 2021-06-17 DIAGNOSIS — N2581 Secondary hyperparathyroidism of renal origin: Secondary | ICD-10-CM | POA: Diagnosis not present

## 2021-06-17 DIAGNOSIS — N186 End stage renal disease: Secondary | ICD-10-CM | POA: Diagnosis not present

## 2021-06-20 DIAGNOSIS — Z992 Dependence on renal dialysis: Secondary | ICD-10-CM | POA: Diagnosis not present

## 2021-06-20 DIAGNOSIS — N2581 Secondary hyperparathyroidism of renal origin: Secondary | ICD-10-CM | POA: Diagnosis not present

## 2021-06-20 DIAGNOSIS — N186 End stage renal disease: Secondary | ICD-10-CM | POA: Diagnosis not present

## 2021-06-21 DIAGNOSIS — D631 Anemia in chronic kidney disease: Secondary | ICD-10-CM | POA: Diagnosis not present

## 2021-06-21 DIAGNOSIS — I872 Venous insufficiency (chronic) (peripheral): Secondary | ICD-10-CM | POA: Diagnosis not present

## 2021-06-21 DIAGNOSIS — Z992 Dependence on renal dialysis: Secondary | ICD-10-CM | POA: Diagnosis not present

## 2021-06-21 DIAGNOSIS — E039 Hypothyroidism, unspecified: Secondary | ICD-10-CM | POA: Diagnosis not present

## 2021-06-21 DIAGNOSIS — N186 End stage renal disease: Secondary | ICD-10-CM | POA: Diagnosis not present

## 2021-06-21 DIAGNOSIS — E1129 Type 2 diabetes mellitus with other diabetic kidney complication: Secondary | ICD-10-CM | POA: Diagnosis not present

## 2021-06-21 DIAGNOSIS — I130 Hypertensive heart and chronic kidney disease with heart failure and stage 1 through stage 4 chronic kidney disease, or unspecified chronic kidney disease: Secondary | ICD-10-CM | POA: Diagnosis not present

## 2021-06-21 DIAGNOSIS — I5032 Chronic diastolic (congestive) heart failure: Secondary | ICD-10-CM | POA: Diagnosis not present

## 2021-06-22 DIAGNOSIS — N186 End stage renal disease: Secondary | ICD-10-CM | POA: Diagnosis not present

## 2021-06-22 DIAGNOSIS — Z992 Dependence on renal dialysis: Secondary | ICD-10-CM | POA: Diagnosis not present

## 2021-06-22 DIAGNOSIS — N2581 Secondary hyperparathyroidism of renal origin: Secondary | ICD-10-CM | POA: Diagnosis not present

## 2021-06-24 DIAGNOSIS — Z992 Dependence on renal dialysis: Secondary | ICD-10-CM | POA: Diagnosis not present

## 2021-06-24 DIAGNOSIS — N2581 Secondary hyperparathyroidism of renal origin: Secondary | ICD-10-CM | POA: Diagnosis not present

## 2021-06-24 DIAGNOSIS — N186 End stage renal disease: Secondary | ICD-10-CM | POA: Diagnosis not present

## 2021-06-27 DIAGNOSIS — N186 End stage renal disease: Secondary | ICD-10-CM | POA: Diagnosis not present

## 2021-06-27 DIAGNOSIS — N2581 Secondary hyperparathyroidism of renal origin: Secondary | ICD-10-CM | POA: Diagnosis not present

## 2021-06-27 DIAGNOSIS — Z992 Dependence on renal dialysis: Secondary | ICD-10-CM | POA: Diagnosis not present

## 2021-06-29 DIAGNOSIS — N2581 Secondary hyperparathyroidism of renal origin: Secondary | ICD-10-CM | POA: Diagnosis not present

## 2021-06-29 DIAGNOSIS — N186 End stage renal disease: Secondary | ICD-10-CM | POA: Diagnosis not present

## 2021-06-29 DIAGNOSIS — Z992 Dependence on renal dialysis: Secondary | ICD-10-CM | POA: Diagnosis not present

## 2021-07-01 DIAGNOSIS — Z992 Dependence on renal dialysis: Secondary | ICD-10-CM | POA: Diagnosis not present

## 2021-07-01 DIAGNOSIS — N2581 Secondary hyperparathyroidism of renal origin: Secondary | ICD-10-CM | POA: Diagnosis not present

## 2021-07-01 DIAGNOSIS — N186 End stage renal disease: Secondary | ICD-10-CM | POA: Diagnosis not present

## 2021-07-04 DIAGNOSIS — N186 End stage renal disease: Secondary | ICD-10-CM | POA: Diagnosis not present

## 2021-07-04 DIAGNOSIS — N2581 Secondary hyperparathyroidism of renal origin: Secondary | ICD-10-CM | POA: Diagnosis not present

## 2021-07-04 DIAGNOSIS — Z992 Dependence on renal dialysis: Secondary | ICD-10-CM | POA: Diagnosis not present

## 2021-07-06 DIAGNOSIS — N186 End stage renal disease: Secondary | ICD-10-CM | POA: Diagnosis not present

## 2021-07-06 DIAGNOSIS — N2581 Secondary hyperparathyroidism of renal origin: Secondary | ICD-10-CM | POA: Diagnosis not present

## 2021-07-06 DIAGNOSIS — Z992 Dependence on renal dialysis: Secondary | ICD-10-CM | POA: Diagnosis not present

## 2021-07-07 DIAGNOSIS — I5032 Chronic diastolic (congestive) heart failure: Secondary | ICD-10-CM | POA: Diagnosis not present

## 2021-07-08 DIAGNOSIS — Z992 Dependence on renal dialysis: Secondary | ICD-10-CM | POA: Diagnosis not present

## 2021-07-08 DIAGNOSIS — E1122 Type 2 diabetes mellitus with diabetic chronic kidney disease: Secondary | ICD-10-CM | POA: Diagnosis not present

## 2021-07-08 DIAGNOSIS — N2581 Secondary hyperparathyroidism of renal origin: Secondary | ICD-10-CM | POA: Diagnosis not present

## 2021-07-08 DIAGNOSIS — N186 End stage renal disease: Secondary | ICD-10-CM | POA: Diagnosis not present

## 2021-07-11 DIAGNOSIS — N2581 Secondary hyperparathyroidism of renal origin: Secondary | ICD-10-CM | POA: Diagnosis not present

## 2021-07-11 DIAGNOSIS — Z992 Dependence on renal dialysis: Secondary | ICD-10-CM | POA: Diagnosis not present

## 2021-07-11 DIAGNOSIS — N186 End stage renal disease: Secondary | ICD-10-CM | POA: Diagnosis not present

## 2021-07-13 DIAGNOSIS — N186 End stage renal disease: Secondary | ICD-10-CM | POA: Diagnosis not present

## 2021-07-13 DIAGNOSIS — N2581 Secondary hyperparathyroidism of renal origin: Secondary | ICD-10-CM | POA: Diagnosis not present

## 2021-07-13 DIAGNOSIS — Z992 Dependence on renal dialysis: Secondary | ICD-10-CM | POA: Diagnosis not present

## 2021-07-15 DIAGNOSIS — Z992 Dependence on renal dialysis: Secondary | ICD-10-CM | POA: Diagnosis not present

## 2021-07-15 DIAGNOSIS — N186 End stage renal disease: Secondary | ICD-10-CM | POA: Diagnosis not present

## 2021-07-15 DIAGNOSIS — N2581 Secondary hyperparathyroidism of renal origin: Secondary | ICD-10-CM | POA: Diagnosis not present

## 2021-07-18 DIAGNOSIS — Z992 Dependence on renal dialysis: Secondary | ICD-10-CM | POA: Diagnosis not present

## 2021-07-18 DIAGNOSIS — N186 End stage renal disease: Secondary | ICD-10-CM | POA: Diagnosis not present

## 2021-07-18 DIAGNOSIS — N2581 Secondary hyperparathyroidism of renal origin: Secondary | ICD-10-CM | POA: Diagnosis not present

## 2021-07-20 DIAGNOSIS — Z992 Dependence on renal dialysis: Secondary | ICD-10-CM | POA: Diagnosis not present

## 2021-07-20 DIAGNOSIS — N2581 Secondary hyperparathyroidism of renal origin: Secondary | ICD-10-CM | POA: Diagnosis not present

## 2021-07-20 DIAGNOSIS — N186 End stage renal disease: Secondary | ICD-10-CM | POA: Diagnosis not present

## 2021-07-22 DIAGNOSIS — N2581 Secondary hyperparathyroidism of renal origin: Secondary | ICD-10-CM | POA: Diagnosis not present

## 2021-07-22 DIAGNOSIS — Z992 Dependence on renal dialysis: Secondary | ICD-10-CM | POA: Diagnosis not present

## 2021-07-22 DIAGNOSIS — N186 End stage renal disease: Secondary | ICD-10-CM | POA: Diagnosis not present

## 2021-07-25 DIAGNOSIS — N2581 Secondary hyperparathyroidism of renal origin: Secondary | ICD-10-CM | POA: Diagnosis not present

## 2021-07-25 DIAGNOSIS — Z992 Dependence on renal dialysis: Secondary | ICD-10-CM | POA: Diagnosis not present

## 2021-07-25 DIAGNOSIS — N186 End stage renal disease: Secondary | ICD-10-CM | POA: Diagnosis not present

## 2021-07-27 DIAGNOSIS — N186 End stage renal disease: Secondary | ICD-10-CM | POA: Diagnosis not present

## 2021-07-27 DIAGNOSIS — Z992 Dependence on renal dialysis: Secondary | ICD-10-CM | POA: Diagnosis not present

## 2021-07-27 DIAGNOSIS — N2581 Secondary hyperparathyroidism of renal origin: Secondary | ICD-10-CM | POA: Diagnosis not present

## 2021-07-29 DIAGNOSIS — N186 End stage renal disease: Secondary | ICD-10-CM | POA: Diagnosis not present

## 2021-07-29 DIAGNOSIS — N2581 Secondary hyperparathyroidism of renal origin: Secondary | ICD-10-CM | POA: Diagnosis not present

## 2021-07-29 DIAGNOSIS — Z992 Dependence on renal dialysis: Secondary | ICD-10-CM | POA: Diagnosis not present

## 2021-08-01 DIAGNOSIS — N186 End stage renal disease: Secondary | ICD-10-CM | POA: Diagnosis not present

## 2021-08-01 DIAGNOSIS — N2581 Secondary hyperparathyroidism of renal origin: Secondary | ICD-10-CM | POA: Diagnosis not present

## 2021-08-01 DIAGNOSIS — Z992 Dependence on renal dialysis: Secondary | ICD-10-CM | POA: Diagnosis not present

## 2021-08-02 ENCOUNTER — Ambulatory Visit: Payer: Medicare HMO | Admitting: Podiatry

## 2021-08-03 DIAGNOSIS — Z992 Dependence on renal dialysis: Secondary | ICD-10-CM | POA: Diagnosis not present

## 2021-08-03 DIAGNOSIS — N186 End stage renal disease: Secondary | ICD-10-CM | POA: Diagnosis not present

## 2021-08-03 DIAGNOSIS — N2581 Secondary hyperparathyroidism of renal origin: Secondary | ICD-10-CM | POA: Diagnosis not present

## 2021-08-05 DIAGNOSIS — N2581 Secondary hyperparathyroidism of renal origin: Secondary | ICD-10-CM | POA: Diagnosis not present

## 2021-08-05 DIAGNOSIS — Z992 Dependence on renal dialysis: Secondary | ICD-10-CM | POA: Diagnosis not present

## 2021-08-05 DIAGNOSIS — N186 End stage renal disease: Secondary | ICD-10-CM | POA: Diagnosis not present

## 2021-08-08 ENCOUNTER — Ambulatory Visit: Payer: Medicare HMO | Admitting: Surgery

## 2021-08-08 ENCOUNTER — Encounter (HOSPITAL_COMMUNITY): Payer: Medicare HMO

## 2021-08-08 DIAGNOSIS — E1122 Type 2 diabetes mellitus with diabetic chronic kidney disease: Secondary | ICD-10-CM | POA: Diagnosis not present

## 2021-08-08 DIAGNOSIS — N186 End stage renal disease: Secondary | ICD-10-CM | POA: Diagnosis not present

## 2021-08-08 DIAGNOSIS — N2581 Secondary hyperparathyroidism of renal origin: Secondary | ICD-10-CM | POA: Diagnosis not present

## 2021-08-08 DIAGNOSIS — Z992 Dependence on renal dialysis: Secondary | ICD-10-CM | POA: Diagnosis not present

## 2021-08-09 ENCOUNTER — Other Ambulatory Visit: Payer: Self-pay

## 2021-08-09 ENCOUNTER — Ambulatory Visit: Payer: Medicare HMO | Admitting: Podiatry

## 2021-08-09 DIAGNOSIS — I739 Peripheral vascular disease, unspecified: Secondary | ICD-10-CM

## 2021-08-10 DIAGNOSIS — Z992 Dependence on renal dialysis: Secondary | ICD-10-CM | POA: Diagnosis not present

## 2021-08-10 DIAGNOSIS — N186 End stage renal disease: Secondary | ICD-10-CM | POA: Diagnosis not present

## 2021-08-10 DIAGNOSIS — N2581 Secondary hyperparathyroidism of renal origin: Secondary | ICD-10-CM | POA: Diagnosis not present

## 2021-08-12 ENCOUNTER — Ambulatory Visit: Payer: Medicare HMO | Admitting: Podiatry

## 2021-08-12 DIAGNOSIS — Z992 Dependence on renal dialysis: Secondary | ICD-10-CM | POA: Diagnosis not present

## 2021-08-12 DIAGNOSIS — N186 End stage renal disease: Secondary | ICD-10-CM | POA: Diagnosis not present

## 2021-08-12 DIAGNOSIS — N2581 Secondary hyperparathyroidism of renal origin: Secondary | ICD-10-CM | POA: Diagnosis not present

## 2021-08-15 DIAGNOSIS — Z992 Dependence on renal dialysis: Secondary | ICD-10-CM | POA: Diagnosis not present

## 2021-08-15 DIAGNOSIS — N2581 Secondary hyperparathyroidism of renal origin: Secondary | ICD-10-CM | POA: Diagnosis not present

## 2021-08-15 DIAGNOSIS — N186 End stage renal disease: Secondary | ICD-10-CM | POA: Diagnosis not present

## 2021-08-16 ENCOUNTER — Other Ambulatory Visit: Payer: Self-pay

## 2021-08-16 ENCOUNTER — Encounter: Payer: Self-pay | Admitting: Vascular Surgery

## 2021-08-16 ENCOUNTER — Ambulatory Visit: Payer: Medicare HMO | Admitting: Vascular Surgery

## 2021-08-16 ENCOUNTER — Ambulatory Visit: Payer: Medicare HMO | Admitting: Podiatry

## 2021-08-16 ENCOUNTER — Ambulatory Visit (HOSPITAL_COMMUNITY)
Admission: RE | Admit: 2021-08-16 | Discharge: 2021-08-16 | Disposition: A | Discharge status: Home / Self Care | Payer: Medicare HMO | Source: Ambulatory Visit | Attending: Vascular Surgery | Admitting: Vascular Surgery

## 2021-08-16 DIAGNOSIS — M79673 Pain in unspecified foot: Secondary | ICD-10-CM | POA: Insufficient documentation

## 2021-08-16 DIAGNOSIS — I739 Peripheral vascular disease, unspecified: Secondary | ICD-10-CM | POA: Diagnosis not present

## 2021-08-16 DIAGNOSIS — M79605 Pain in left leg: Secondary | ICD-10-CM | POA: Insufficient documentation

## 2021-08-16 DIAGNOSIS — M79672 Pain in left foot: Secondary | ICD-10-CM | POA: Diagnosis not present

## 2021-08-16 NOTE — Progress Notes (Signed)
Patient name: David Nguyen MRN: [id number] DOB: 08-Dec-1940 Sex: male  REASON FOR VISIT: Evaluate leg pain during dialysis  HPI: David Nguyen is a 80 y.o. male with history of hypertension, hyperlipidemia, diabetes, end-stage renal disease on dialysis Monday Wednesday Friday that presents for evaluation of leg pain.  He specifically states that the pain is in his left leg.  Right leg does not bother him.  He states this has been ongoing for years.  Bothers him on the shin not the foot itself.  He feels this is worse when its cold.  It also sometimes bothers him in dialysis particularly if it is cold in the room.  He has had no previous lower extremity interventions.  He is well-known to our practice with previous access placement for dialysis.  He walks with a cane.  No open wounds or pain in the foot itself.  Past Medical History:  Diagnosis Date   Allergic rhinitis    Anemia    low iron   Arthritis    left leg   Bradycardia 03/2019   Chronic kidney disease    Followed by Dr Deterding     Diabetes mellitus    Type II   Horseshoe kidney    Hyperlipidemia    Hypertension    Hypothyroidism    Refusal of blood transfusions as patient is Jehovah's Witness    Scrotal edema    Sleep apnea    uses a cpap    Past Surgical History:  Procedure Laterality Date   A/V FISTULAGRAM Left 03/05/2020   Procedure: A/V FISTULAGRAM - Left Arm;  Surgeon: Angelia Mould, MD;  Location: [place] [facility];  Service: Cardiovascular;  Laterality: Left;   ADRENALECTOMY  1993   left   AV FISTULA PLACEMENT Left 05/06/2018   Procedure: ARTERIOVENOUS (AV) FISTULA CREATION RADIOCEPHALIC;  Surgeon: [name], MD;  Location: [place];  Service: Vascular;  Laterality: Left;   COLONOSCOPY W/ POLYPECTOMY     INGUINAL HERNIA REPAIR Right 07/05/2020   Procedure: HERNIA REPAIR INGUINAL ADULT;  Surgeon: Coralie Keens, MD;  Location: [place];  Service: General;  Laterality: Right;  LMA   INSERTION OF MESH  Right 07/05/2020   Procedure: INSERTION OF MESH;  Surgeon: Coralie Keens, MD;  Location: [place];  Service: General;  Laterality: Right;   REVISION OF ARTERIOVENOUS GORETEX GRAFT Left 07/11/2018   Procedure: TRANSPOSITION OF RADIOCEPHALIC  ARTERIOVENOUS FISTULA LEFT ARM;  Surgeon: [name], MD;  [facility];  Service: Vascular;  Laterality: Left;   REVISION OF ARTERIOVENOUS GORETEX GRAFT Left 03/12/2020   Procedure: REVISION OF LEFT RADIOCEPHALIC FISTULA;  Surgeon: [name], MD;  Location[facility];  Service: Vascular;  Laterality: Left;    Family History  Problem Relation Age of Onset   Prostate cancer Father    Kidney failure Mother    Lung cancer Sister     SOCIAL HISTORY: Social History   Tobacco Use   Smoking status: Former    Packs/day: 0.30    Years: 3.00    Pack years: 0.90    Types: Cigarettes    Quit date: 10/09/1968    Years since quitting: 52.8   Smokeless tobacco: Never  Substance Use Topics   Alcohol use: No    Allergies  Allergen Reactions   Penicillins Rash and Other (See Comments)    Childhood allergy       Current Outpatient Medications  Medication Sig Dispense Refill   ACCU-CHEK GUIDE  test strip      acetaminophen (TYLENOL) 650 MG CR tablet Take 650 mg by mouth daily as needed for pain.      allopurinol (ZYLOPRIM) 100 MG tablet Take 200 mg by mouth daily.      amLODipine (NORVASC) 10 MG tablet Take 10 mg by mouth daily.     Ascorbic Acid (VITAMIN C) 1000 MG tablet Take 1,000 mg by mouth daily.     aspirin EC 81 MG tablet Take 81 mg by mouth daily.     atorvastatin (LIPITOR) 40 MG tablet Take 40 mg by mouth at bedtime.      carvedilol (COREG) 3.125 MG tablet Take 1 tablet (3.125 mg total) by mouth 2 (two) times daily with a meal. 60 tablet 0   doxazosin (CARDURA) 8 MG tablet Take 8 mg by mouth at bedtime.     DROPLET PEN NEEDLES 31G X 8 MM MISC      fluticasone (FLONASE) 50 MCG/ACT nasal spray Place 2 sprays into both nostrils daily as  needed for allergies.   11   Homeopathic Products [facility] ALLERGY EYE RELIEF) SOLN Place 1-2 drops into both eyes daily as needed (for dry eyes).     insulin NPH-regular Human (70-30) 100 UNIT/ML injection Inject 16-18 Units into the skin See admin instructions. Inject 18 units in the morning and 16 units at night     levothyroxine (SYNTHROID, LEVOTHROID) 75 MCG tablet Take 75 mcg by mouth daily before breakfast.     loratadine (CLARITIN) 10 MG tablet Take 10 mg by mouth daily as needed for allergies.      montelukast (SINGULAIR) 10 MG tablet Take 10 mg by mouth daily.     Multiple Vitamin (MULTIVITAMIN WITH MINERALS) TABS tablet Take 1 tablet by mouth daily.     PRESCRIPTION MEDICATION Inhale into the lungs at bedtime. CPAP     Vitamin D3 (VITAMIN D) 25 MCG tablet Take 1,000 Units by mouth daily.     calcitRIOL (ROCALTROL) 0.25 MCG capsule Take 0.25 mcg by mouth daily.  (Patient not taking: Reported on 08/16/2021)     furosemide (LASIX) 80 MG tablet Take 1 tablet (80 mg total) by mouth 2 (two) times daily. 60 tablet 11   traMADol (ULTRAM) 50 MG tablet Take 1 tablet (50 mg total) by mouth every 6 (six) hours as needed for moderate pain or severe pain. (Patient not taking: Reported on 08/16/2021) 20 tablet 1   No current facility-administered medications for this visit.    REVIEW OF SYSTEMS:  [X]  denotes positive finding, [ ]  denotes negative finding Cardiac  Comments:  Chest pain or chest pressure:    Shortness of breath upon exertion:    Short of breath when lying flat:    Irregular heart rhythm:        Vascular    Pain in calf, thigh, or hip brought on by ambulation:    Pain in feet at night that wakes you up from your sleep:     Blood clot in your veins:    Leg swelling:         Pulmonary    Oxygen at home:    Productive cough:     Wheezing:         Neurologic    Sudden weakness in arms or legs:     Sudden numbness in arms or legs:     Sudden onset of difficulty speaking or  slurred speech:    Temporary loss of vision in one eye:  Problems with dizziness:         Gastrointestinal    Blood in stool:     Vomited blood:         Genitourinary    Burning when urinating:     Blood in urine:        Psychiatric    Major depression:         Hematologic    Bleeding problems:    Problems with blood clotting too easily:        Skin    Rashes or ulcers:        Constitutional    Fever or chills:      PHYSICAL EXAM: Vitals:   08/16/21 1521  BP: 129/73  Pulse: 69  Resp: 16  Temp: 98 F (36.7 C)  TempSrc: Temporal  SpO2: 98%  Weight: 221 lb (100.2 kg)  Height: 6' (1.829 m)    GENERAL: The patient is a well-nourished male, in no acute distress. The vital signs are documented above. CARDIAC: There is a regular rate and rhythm.  VASCULAR:  Palpable femoral pulses bilaterally Palpable DP pulses bilaterally No lower extremity tissue loss. PULMONARY: No respiratory distress. ABDOMEN: Soft and non-tender. MUSCULOSKELETAL: There are no major deformities or cyanosis. NEUROLOGIC: No focal weakness or paresthesias are detected. SKIN: There are no ulcers or rashes noted. PSYCHIATRIC: The patient has a normal affect.  DATA:   ABIs today are noncompressible but he does have normal triphasic waveforms at the ankle with pulsatile toe tracings and toe pressure above 100  Assessment/Plan:  80 year old male with end-stage renal disease that presents for evaluation of left leg pain and to evaluate if there is any evidence of underlying arterial insufficiency as the etiology.  I discussed with the patient and his wife today that he has normal triphasic waveforms at the ankle with excellent pulsatile toe tracings.  On exam he has a palpable dorsalis pedis pulse.  His pain is in the shin itself not necessarily in the foot.  I do not feel this is related to arterial insufficiency as I discussed with him and family.  He can follow-up with me as  needed.   Marty Heck, MD [facility] Office: [phone]     2

## 2021-08-17 DIAGNOSIS — N186 End stage renal disease: Secondary | ICD-10-CM | POA: Diagnosis not present

## 2021-08-17 DIAGNOSIS — N2581 Secondary hyperparathyroidism of renal origin: Secondary | ICD-10-CM | POA: Diagnosis not present

## 2021-08-17 DIAGNOSIS — Z992 Dependence on renal dialysis: Secondary | ICD-10-CM | POA: Diagnosis not present

## 2021-08-19 DIAGNOSIS — N186 End stage renal disease: Secondary | ICD-10-CM | POA: Diagnosis not present

## 2021-08-19 DIAGNOSIS — Z992 Dependence on renal dialysis: Secondary | ICD-10-CM | POA: Diagnosis not present

## 2021-08-19 DIAGNOSIS — N2581 Secondary hyperparathyroidism of renal origin: Secondary | ICD-10-CM | POA: Diagnosis not present

## 2021-08-22 DIAGNOSIS — N186 End stage renal disease: Secondary | ICD-10-CM | POA: Diagnosis not present

## 2021-08-22 DIAGNOSIS — N2581 Secondary hyperparathyroidism of renal origin: Secondary | ICD-10-CM | POA: Diagnosis not present

## 2021-08-22 DIAGNOSIS — Z992 Dependence on renal dialysis: Secondary | ICD-10-CM | POA: Diagnosis not present

## 2021-08-23 DIAGNOSIS — L821 Other seborrheic keratosis: Secondary | ICD-10-CM | POA: Diagnosis not present

## 2021-08-24 DIAGNOSIS — N2581 Secondary hyperparathyroidism of renal origin: Secondary | ICD-10-CM | POA: Diagnosis not present

## 2021-08-24 DIAGNOSIS — N186 End stage renal disease: Secondary | ICD-10-CM | POA: Diagnosis not present

## 2021-08-24 DIAGNOSIS — Z992 Dependence on renal dialysis: Secondary | ICD-10-CM | POA: Diagnosis not present

## 2021-08-26 DIAGNOSIS — Z992 Dependence on renal dialysis: Secondary | ICD-10-CM | POA: Diagnosis not present

## 2021-08-26 DIAGNOSIS — N186 End stage renal disease: Secondary | ICD-10-CM | POA: Diagnosis not present

## 2021-08-26 DIAGNOSIS — N2581 Secondary hyperparathyroidism of renal origin: Secondary | ICD-10-CM | POA: Diagnosis not present

## 2021-08-29 DIAGNOSIS — N2581 Secondary hyperparathyroidism of renal origin: Secondary | ICD-10-CM | POA: Diagnosis not present

## 2021-08-29 DIAGNOSIS — Z992 Dependence on renal dialysis: Secondary | ICD-10-CM | POA: Diagnosis not present

## 2021-08-29 DIAGNOSIS — N186 End stage renal disease: Secondary | ICD-10-CM | POA: Diagnosis not present

## 2021-08-31 DIAGNOSIS — N2581 Secondary hyperparathyroidism of renal origin: Secondary | ICD-10-CM | POA: Diagnosis not present

## 2021-08-31 DIAGNOSIS — Z992 Dependence on renal dialysis: Secondary | ICD-10-CM | POA: Diagnosis not present

## 2021-08-31 DIAGNOSIS — N186 End stage renal disease: Secondary | ICD-10-CM | POA: Diagnosis not present

## 2021-09-02 DIAGNOSIS — N186 End stage renal disease: Secondary | ICD-10-CM | POA: Diagnosis not present

## 2021-09-02 DIAGNOSIS — Z992 Dependence on renal dialysis: Secondary | ICD-10-CM | POA: Diagnosis not present

## 2021-09-02 DIAGNOSIS — N2581 Secondary hyperparathyroidism of renal origin: Secondary | ICD-10-CM | POA: Diagnosis not present

## 2021-09-05 DIAGNOSIS — Z992 Dependence on renal dialysis: Secondary | ICD-10-CM | POA: Diagnosis not present

## 2021-09-05 DIAGNOSIS — N2581 Secondary hyperparathyroidism of renal origin: Secondary | ICD-10-CM | POA: Diagnosis not present

## 2021-09-05 DIAGNOSIS — N186 End stage renal disease: Secondary | ICD-10-CM | POA: Diagnosis not present

## 2021-09-07 DIAGNOSIS — Z992 Dependence on renal dialysis: Secondary | ICD-10-CM | POA: Diagnosis not present

## 2021-09-07 DIAGNOSIS — E1122 Type 2 diabetes mellitus with diabetic chronic kidney disease: Secondary | ICD-10-CM | POA: Diagnosis not present

## 2021-09-07 DIAGNOSIS — N2581 Secondary hyperparathyroidism of renal origin: Secondary | ICD-10-CM | POA: Diagnosis not present

## 2021-09-07 DIAGNOSIS — N186 End stage renal disease: Secondary | ICD-10-CM | POA: Diagnosis not present

## 2021-09-09 DIAGNOSIS — Z992 Dependence on renal dialysis: Secondary | ICD-10-CM | POA: Diagnosis not present

## 2021-09-09 DIAGNOSIS — N186 End stage renal disease: Secondary | ICD-10-CM | POA: Diagnosis not present

## 2021-09-09 DIAGNOSIS — N2581 Secondary hyperparathyroidism of renal origin: Secondary | ICD-10-CM | POA: Diagnosis not present

## 2021-09-12 DIAGNOSIS — Z992 Dependence on renal dialysis: Secondary | ICD-10-CM | POA: Diagnosis not present

## 2021-09-12 DIAGNOSIS — N186 End stage renal disease: Secondary | ICD-10-CM | POA: Diagnosis not present

## 2021-09-12 DIAGNOSIS — N2581 Secondary hyperparathyroidism of renal origin: Secondary | ICD-10-CM | POA: Diagnosis not present

## 2021-09-13 ENCOUNTER — Other Ambulatory Visit: Payer: Self-pay

## 2021-09-13 ENCOUNTER — Ambulatory Visit (INDEPENDENT_AMBULATORY_CARE_PROVIDER_SITE_OTHER): Payer: Medicare HMO | Admitting: Podiatry

## 2021-09-13 ENCOUNTER — Encounter: Payer: Self-pay | Admitting: Podiatry

## 2021-09-13 DIAGNOSIS — M79675 Pain in left toe(s): Secondary | ICD-10-CM | POA: Diagnosis not present

## 2021-09-13 DIAGNOSIS — M79674 Pain in right toe(s): Secondary | ICD-10-CM | POA: Diagnosis not present

## 2021-09-13 DIAGNOSIS — B351 Tinea unguium: Secondary | ICD-10-CM

## 2021-09-13 DIAGNOSIS — E1122 Type 2 diabetes mellitus with diabetic chronic kidney disease: Secondary | ICD-10-CM

## 2021-09-13 DIAGNOSIS — E118 Type 2 diabetes mellitus with unspecified complications: Secondary | ICD-10-CM | POA: Diagnosis not present

## 2021-09-13 DIAGNOSIS — N184 Chronic kidney disease, stage 4 (severe): Secondary | ICD-10-CM | POA: Diagnosis not present

## 2021-09-13 NOTE — Progress Notes (Signed)
This patient returns to my office for at risk foot care.  This patient requires this care by a professional since this patient will be at risk due to having CKD, and type 2 diabetes  This patient is unable to cut nails himself since the patient cannot reach his nails.These nails are painful walking and wearing shoes.  This patient presents for at risk foot care today.  General Appearance  Alert, conversant and in no acute stress.  Vascular  Dorsalis pedis and posterior tibial  pulses are weakly palpable  bilaterally.  Capillary return is within normal limits  bilaterally. Temperature is within normal limits  bilaterally.  Neurologic  Senn-Weinstein monofilament wire test within normal limits  bilaterally. Muscle power within normal limits bilaterally.  Nails Thick disfigured discolored nails with subungual debris  from hallux to fifth toes bilaterally. No evidence of bacterial infection or drainage bilaterally.  Orthopedic  No limitations of motion  feet .  No crepitus or effusions noted.  No bony pathology or digital deformities noted. HAV  B/L.  Skin  normotropic skin with no porokeratosis noted bilaterally.  No signs of infections or ulcers noted.     Onychomycosis  Pain in right toes  Pain in left toes  Consent was obtained for treatment procedures.   Mechanical debridement of nails 1-5  bilaterally performed with a nail nipper.  Filed with dremel without incident.    Return office visit    3 months                 Told patient to return for periodic foot care and evaluation due to potential at risk complications.   Gwynn Crossley DPM  

## 2021-09-14 DIAGNOSIS — N186 End stage renal disease: Secondary | ICD-10-CM | POA: Diagnosis not present

## 2021-09-14 DIAGNOSIS — Z992 Dependence on renal dialysis: Secondary | ICD-10-CM | POA: Diagnosis not present

## 2021-09-14 DIAGNOSIS — N2581 Secondary hyperparathyroidism of renal origin: Secondary | ICD-10-CM | POA: Diagnosis not present

## 2021-09-16 DIAGNOSIS — N2581 Secondary hyperparathyroidism of renal origin: Secondary | ICD-10-CM | POA: Diagnosis not present

## 2021-09-16 DIAGNOSIS — Z992 Dependence on renal dialysis: Secondary | ICD-10-CM | POA: Diagnosis not present

## 2021-09-16 DIAGNOSIS — N186 End stage renal disease: Secondary | ICD-10-CM | POA: Diagnosis not present

## 2021-09-19 DIAGNOSIS — N186 End stage renal disease: Secondary | ICD-10-CM | POA: Diagnosis not present

## 2021-09-19 DIAGNOSIS — N2581 Secondary hyperparathyroidism of renal origin: Secondary | ICD-10-CM | POA: Diagnosis not present

## 2021-09-19 DIAGNOSIS — Z992 Dependence on renal dialysis: Secondary | ICD-10-CM | POA: Diagnosis not present

## 2021-09-21 DIAGNOSIS — Z992 Dependence on renal dialysis: Secondary | ICD-10-CM | POA: Diagnosis not present

## 2021-09-21 DIAGNOSIS — N186 End stage renal disease: Secondary | ICD-10-CM | POA: Diagnosis not present

## 2021-09-21 DIAGNOSIS — N2581 Secondary hyperparathyroidism of renal origin: Secondary | ICD-10-CM | POA: Diagnosis not present

## 2021-09-23 DIAGNOSIS — Z992 Dependence on renal dialysis: Secondary | ICD-10-CM | POA: Diagnosis not present

## 2021-09-23 DIAGNOSIS — N186 End stage renal disease: Secondary | ICD-10-CM | POA: Diagnosis not present

## 2021-09-23 DIAGNOSIS — N2581 Secondary hyperparathyroidism of renal origin: Secondary | ICD-10-CM | POA: Diagnosis not present

## 2021-09-26 DIAGNOSIS — N2581 Secondary hyperparathyroidism of renal origin: Secondary | ICD-10-CM | POA: Diagnosis not present

## 2021-09-26 DIAGNOSIS — N186 End stage renal disease: Secondary | ICD-10-CM | POA: Diagnosis not present

## 2021-09-26 DIAGNOSIS — Z992 Dependence on renal dialysis: Secondary | ICD-10-CM | POA: Diagnosis not present

## 2021-09-28 DIAGNOSIS — N2581 Secondary hyperparathyroidism of renal origin: Secondary | ICD-10-CM | POA: Diagnosis not present

## 2021-09-28 DIAGNOSIS — N186 End stage renal disease: Secondary | ICD-10-CM | POA: Diagnosis not present

## 2021-09-28 DIAGNOSIS — Z992 Dependence on renal dialysis: Secondary | ICD-10-CM | POA: Diagnosis not present

## 2021-09-30 DIAGNOSIS — Z992 Dependence on renal dialysis: Secondary | ICD-10-CM | POA: Diagnosis not present

## 2021-09-30 DIAGNOSIS — N2581 Secondary hyperparathyroidism of renal origin: Secondary | ICD-10-CM | POA: Diagnosis not present

## 2021-09-30 DIAGNOSIS — N186 End stage renal disease: Secondary | ICD-10-CM | POA: Diagnosis not present

## 2021-10-03 DIAGNOSIS — N2581 Secondary hyperparathyroidism of renal origin: Secondary | ICD-10-CM | POA: Diagnosis not present

## 2021-10-03 DIAGNOSIS — N186 End stage renal disease: Secondary | ICD-10-CM | POA: Diagnosis not present

## 2021-10-03 DIAGNOSIS — Z992 Dependence on renal dialysis: Secondary | ICD-10-CM | POA: Diagnosis not present

## 2021-10-05 DIAGNOSIS — N186 End stage renal disease: Secondary | ICD-10-CM | POA: Diagnosis not present

## 2021-10-05 DIAGNOSIS — N2581 Secondary hyperparathyroidism of renal origin: Secondary | ICD-10-CM | POA: Diagnosis not present

## 2021-10-05 DIAGNOSIS — Z992 Dependence on renal dialysis: Secondary | ICD-10-CM | POA: Diagnosis not present

## 2021-10-07 DIAGNOSIS — Z992 Dependence on renal dialysis: Secondary | ICD-10-CM | POA: Diagnosis not present

## 2021-10-07 DIAGNOSIS — N2581 Secondary hyperparathyroidism of renal origin: Secondary | ICD-10-CM | POA: Diagnosis not present

## 2021-10-07 DIAGNOSIS — N186 End stage renal disease: Secondary | ICD-10-CM | POA: Diagnosis not present

## 2021-10-08 DIAGNOSIS — E1122 Type 2 diabetes mellitus with diabetic chronic kidney disease: Secondary | ICD-10-CM | POA: Diagnosis not present

## 2021-10-08 DIAGNOSIS — N186 End stage renal disease: Secondary | ICD-10-CM | POA: Diagnosis not present

## 2021-10-08 DIAGNOSIS — Z992 Dependence on renal dialysis: Secondary | ICD-10-CM | POA: Diagnosis not present

## 2021-10-10 DIAGNOSIS — Z992 Dependence on renal dialysis: Secondary | ICD-10-CM | POA: Diagnosis not present

## 2021-10-10 DIAGNOSIS — N186 End stage renal disease: Secondary | ICD-10-CM | POA: Diagnosis not present

## 2021-10-10 DIAGNOSIS — N2581 Secondary hyperparathyroidism of renal origin: Secondary | ICD-10-CM | POA: Diagnosis not present

## 2021-10-12 DIAGNOSIS — N186 End stage renal disease: Secondary | ICD-10-CM | POA: Diagnosis not present

## 2021-10-12 DIAGNOSIS — N2581 Secondary hyperparathyroidism of renal origin: Secondary | ICD-10-CM | POA: Diagnosis not present

## 2021-10-12 DIAGNOSIS — Z992 Dependence on renal dialysis: Secondary | ICD-10-CM | POA: Diagnosis not present

## 2021-10-14 DIAGNOSIS — N2581 Secondary hyperparathyroidism of renal origin: Secondary | ICD-10-CM | POA: Diagnosis not present

## 2021-10-14 DIAGNOSIS — Z992 Dependence on renal dialysis: Secondary | ICD-10-CM | POA: Diagnosis not present

## 2021-10-14 DIAGNOSIS — N186 End stage renal disease: Secondary | ICD-10-CM | POA: Diagnosis not present

## 2021-10-17 DIAGNOSIS — N186 End stage renal disease: Secondary | ICD-10-CM | POA: Diagnosis not present

## 2021-10-17 DIAGNOSIS — N2581 Secondary hyperparathyroidism of renal origin: Secondary | ICD-10-CM | POA: Diagnosis not present

## 2021-10-17 DIAGNOSIS — Z992 Dependence on renal dialysis: Secondary | ICD-10-CM | POA: Diagnosis not present

## 2021-10-18 DIAGNOSIS — Z992 Dependence on renal dialysis: Secondary | ICD-10-CM | POA: Diagnosis not present

## 2021-10-18 DIAGNOSIS — T82858A Stenosis of vascular prosthetic devices, implants and grafts, initial encounter: Secondary | ICD-10-CM | POA: Diagnosis not present

## 2021-10-18 DIAGNOSIS — I871 Compression of vein: Secondary | ICD-10-CM | POA: Diagnosis not present

## 2021-10-18 DIAGNOSIS — N186 End stage renal disease: Secondary | ICD-10-CM | POA: Diagnosis not present

## 2021-10-19 DIAGNOSIS — N186 End stage renal disease: Secondary | ICD-10-CM | POA: Diagnosis not present

## 2021-10-19 DIAGNOSIS — Z992 Dependence on renal dialysis: Secondary | ICD-10-CM | POA: Diagnosis not present

## 2021-10-19 DIAGNOSIS — N2581 Secondary hyperparathyroidism of renal origin: Secondary | ICD-10-CM | POA: Diagnosis not present

## 2021-10-21 DIAGNOSIS — N2581 Secondary hyperparathyroidism of renal origin: Secondary | ICD-10-CM | POA: Diagnosis not present

## 2021-10-21 DIAGNOSIS — Z992 Dependence on renal dialysis: Secondary | ICD-10-CM | POA: Diagnosis not present

## 2021-10-21 DIAGNOSIS — N186 End stage renal disease: Secondary | ICD-10-CM | POA: Diagnosis not present

## 2021-10-24 DIAGNOSIS — Z992 Dependence on renal dialysis: Secondary | ICD-10-CM | POA: Diagnosis not present

## 2021-10-24 DIAGNOSIS — N2581 Secondary hyperparathyroidism of renal origin: Secondary | ICD-10-CM | POA: Diagnosis not present

## 2021-10-24 DIAGNOSIS — N186 End stage renal disease: Secondary | ICD-10-CM | POA: Diagnosis not present

## 2021-10-26 DIAGNOSIS — N2581 Secondary hyperparathyroidism of renal origin: Secondary | ICD-10-CM | POA: Diagnosis not present

## 2021-10-26 DIAGNOSIS — Z992 Dependence on renal dialysis: Secondary | ICD-10-CM | POA: Diagnosis not present

## 2021-10-26 DIAGNOSIS — N186 End stage renal disease: Secondary | ICD-10-CM | POA: Diagnosis not present

## 2021-10-28 DIAGNOSIS — N186 End stage renal disease: Secondary | ICD-10-CM | POA: Diagnosis not present

## 2021-10-28 DIAGNOSIS — Z992 Dependence on renal dialysis: Secondary | ICD-10-CM | POA: Diagnosis not present

## 2021-10-28 DIAGNOSIS — N2581 Secondary hyperparathyroidism of renal origin: Secondary | ICD-10-CM | POA: Diagnosis not present

## 2021-10-30 IMAGING — DX DG CHEST 1V PORT
1 series · 1 of 1 positions shown · non-contrast
Comparison: Single-view of the chest 03/31/2019.

CLINICAL DATA: Episode of bradycardia last night.

EXAM:
PORTABLE CHEST 1 VIEW

[chest]
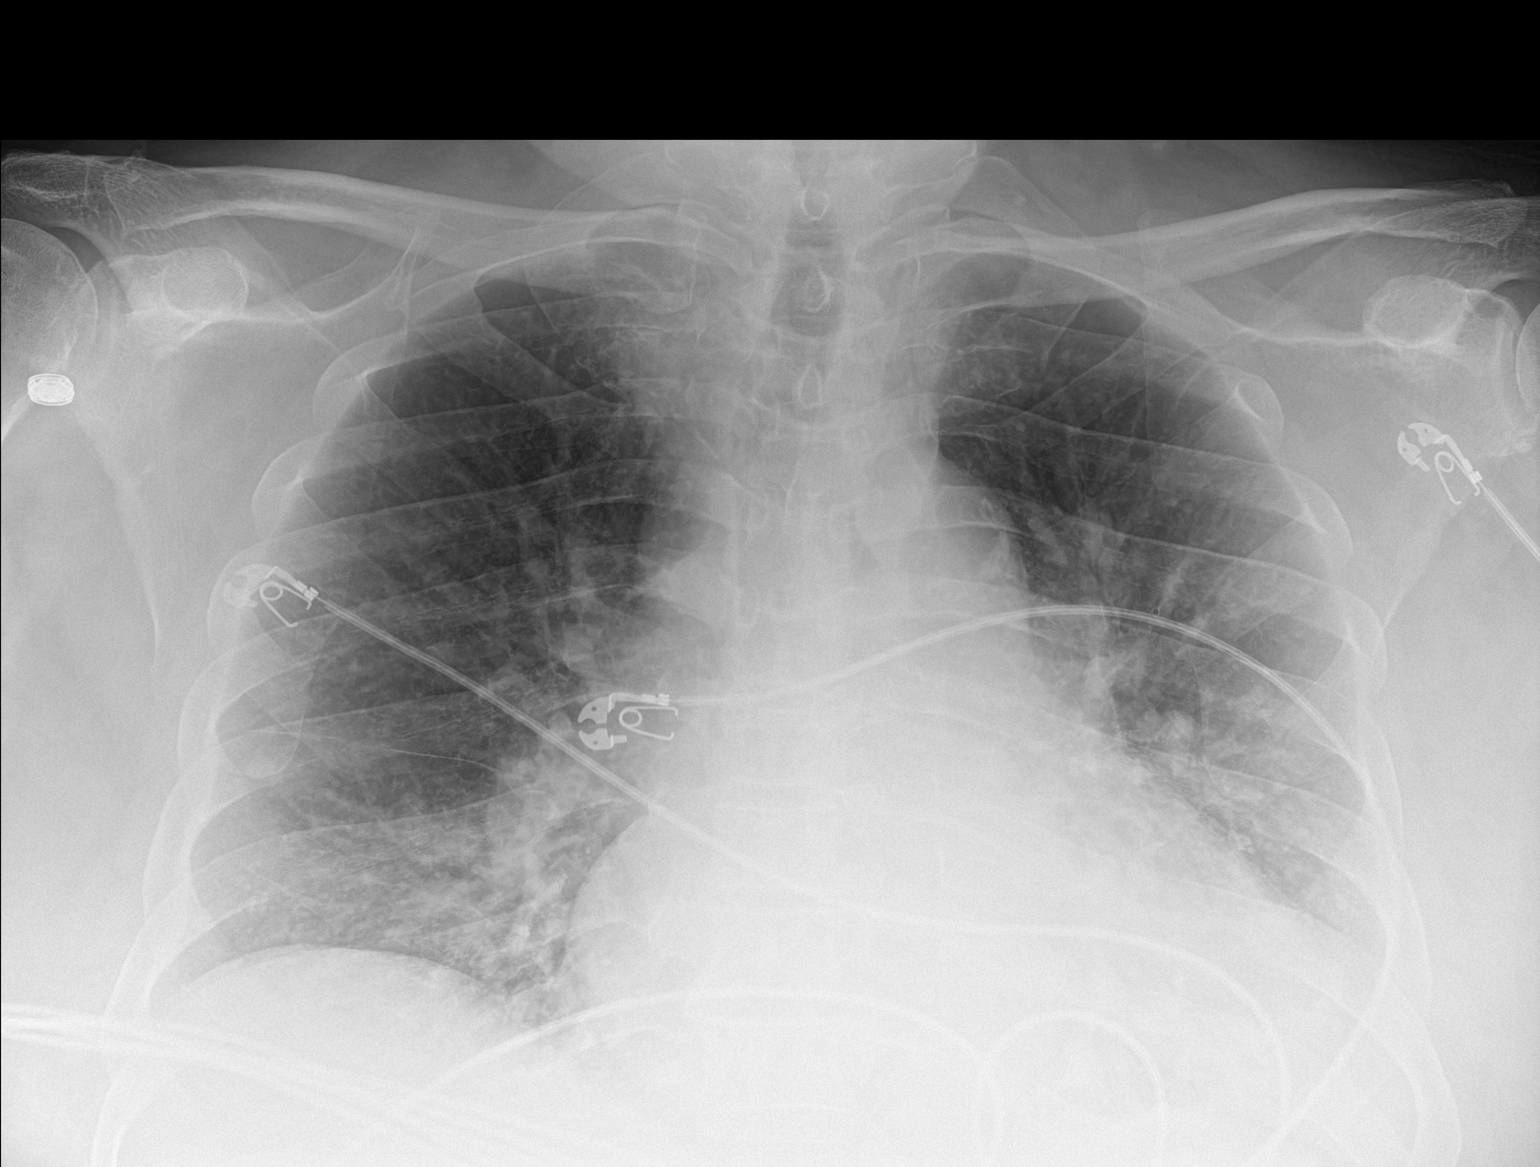

[1 of 1 positions shown; findings below may reference images not displayed]

FINDINGS: Cardiomegaly and vascular congestion. No consolidative process,
pneumothorax or effusion. Atherosclerosis. No acute bony
abnormality.
IMPRESSION: Cardiomegaly and pulmonary vascular congestion.

Aortic Atherosclerosis (W1TDF-UOG.G).

## 2021-10-31 DIAGNOSIS — N186 End stage renal disease: Secondary | ICD-10-CM | POA: Diagnosis not present

## 2021-10-31 DIAGNOSIS — Z992 Dependence on renal dialysis: Secondary | ICD-10-CM | POA: Diagnosis not present

## 2021-10-31 DIAGNOSIS — N2581 Secondary hyperparathyroidism of renal origin: Secondary | ICD-10-CM | POA: Diagnosis not present

## 2021-11-02 DIAGNOSIS — Z992 Dependence on renal dialysis: Secondary | ICD-10-CM | POA: Diagnosis not present

## 2021-11-02 DIAGNOSIS — N186 End stage renal disease: Secondary | ICD-10-CM | POA: Diagnosis not present

## 2021-11-02 DIAGNOSIS — N2581 Secondary hyperparathyroidism of renal origin: Secondary | ICD-10-CM | POA: Diagnosis not present

## 2021-11-04 DIAGNOSIS — N2581 Secondary hyperparathyroidism of renal origin: Secondary | ICD-10-CM | POA: Diagnosis not present

## 2021-11-04 DIAGNOSIS — N186 End stage renal disease: Secondary | ICD-10-CM | POA: Diagnosis not present

## 2021-11-04 DIAGNOSIS — Z992 Dependence on renal dialysis: Secondary | ICD-10-CM | POA: Diagnosis not present

## 2021-11-07 DIAGNOSIS — N186 End stage renal disease: Secondary | ICD-10-CM | POA: Diagnosis not present

## 2021-11-07 DIAGNOSIS — N2581 Secondary hyperparathyroidism of renal origin: Secondary | ICD-10-CM | POA: Diagnosis not present

## 2021-11-07 DIAGNOSIS — Z992 Dependence on renal dialysis: Secondary | ICD-10-CM | POA: Diagnosis not present

## 2021-11-08 DIAGNOSIS — Z992 Dependence on renal dialysis: Secondary | ICD-10-CM | POA: Diagnosis not present

## 2021-11-08 DIAGNOSIS — E1122 Type 2 diabetes mellitus with diabetic chronic kidney disease: Secondary | ICD-10-CM | POA: Diagnosis not present

## 2021-11-08 DIAGNOSIS — N186 End stage renal disease: Secondary | ICD-10-CM | POA: Diagnosis not present

## 2021-11-09 DIAGNOSIS — N2581 Secondary hyperparathyroidism of renal origin: Secondary | ICD-10-CM | POA: Diagnosis not present

## 2021-11-09 DIAGNOSIS — Z992 Dependence on renal dialysis: Secondary | ICD-10-CM | POA: Diagnosis not present

## 2021-11-09 DIAGNOSIS — N186 End stage renal disease: Secondary | ICD-10-CM | POA: Diagnosis not present

## 2021-11-11 DIAGNOSIS — N186 End stage renal disease: Secondary | ICD-10-CM | POA: Diagnosis not present

## 2021-11-11 DIAGNOSIS — N2581 Secondary hyperparathyroidism of renal origin: Secondary | ICD-10-CM | POA: Diagnosis not present

## 2021-11-11 DIAGNOSIS — Z992 Dependence on renal dialysis: Secondary | ICD-10-CM | POA: Diagnosis not present

## 2021-11-14 DIAGNOSIS — N2581 Secondary hyperparathyroidism of renal origin: Secondary | ICD-10-CM | POA: Diagnosis not present

## 2021-11-14 DIAGNOSIS — Z992 Dependence on renal dialysis: Secondary | ICD-10-CM | POA: Diagnosis not present

## 2021-11-14 DIAGNOSIS — N186 End stage renal disease: Secondary | ICD-10-CM | POA: Diagnosis not present

## 2021-11-16 DIAGNOSIS — Z992 Dependence on renal dialysis: Secondary | ICD-10-CM | POA: Diagnosis not present

## 2021-11-16 DIAGNOSIS — N2581 Secondary hyperparathyroidism of renal origin: Secondary | ICD-10-CM | POA: Diagnosis not present

## 2021-11-16 DIAGNOSIS — N186 End stage renal disease: Secondary | ICD-10-CM | POA: Diagnosis not present

## 2021-11-18 DIAGNOSIS — N186 End stage renal disease: Secondary | ICD-10-CM | POA: Diagnosis not present

## 2021-11-18 DIAGNOSIS — N2581 Secondary hyperparathyroidism of renal origin: Secondary | ICD-10-CM | POA: Diagnosis not present

## 2021-11-18 DIAGNOSIS — Z992 Dependence on renal dialysis: Secondary | ICD-10-CM | POA: Diagnosis not present

## 2021-11-21 DIAGNOSIS — Z992 Dependence on renal dialysis: Secondary | ICD-10-CM | POA: Diagnosis not present

## 2021-11-21 DIAGNOSIS — N186 End stage renal disease: Secondary | ICD-10-CM | POA: Diagnosis not present

## 2021-11-21 DIAGNOSIS — N2581 Secondary hyperparathyroidism of renal origin: Secondary | ICD-10-CM | POA: Diagnosis not present

## 2021-11-23 DIAGNOSIS — N186 End stage renal disease: Secondary | ICD-10-CM | POA: Diagnosis not present

## 2021-11-23 DIAGNOSIS — Z992 Dependence on renal dialysis: Secondary | ICD-10-CM | POA: Diagnosis not present

## 2021-11-23 DIAGNOSIS — N2581 Secondary hyperparathyroidism of renal origin: Secondary | ICD-10-CM | POA: Diagnosis not present

## 2021-11-25 DIAGNOSIS — Z992 Dependence on renal dialysis: Secondary | ICD-10-CM | POA: Diagnosis not present

## 2021-11-25 DIAGNOSIS — N2581 Secondary hyperparathyroidism of renal origin: Secondary | ICD-10-CM | POA: Diagnosis not present

## 2021-11-25 DIAGNOSIS — N186 End stage renal disease: Secondary | ICD-10-CM | POA: Diagnosis not present

## 2021-11-28 DIAGNOSIS — N186 End stage renal disease: Secondary | ICD-10-CM | POA: Diagnosis not present

## 2021-11-28 DIAGNOSIS — Z992 Dependence on renal dialysis: Secondary | ICD-10-CM | POA: Diagnosis not present

## 2021-11-28 DIAGNOSIS — N2581 Secondary hyperparathyroidism of renal origin: Secondary | ICD-10-CM | POA: Diagnosis not present

## 2021-11-30 DIAGNOSIS — N186 End stage renal disease: Secondary | ICD-10-CM | POA: Diagnosis not present

## 2021-11-30 DIAGNOSIS — Z992 Dependence on renal dialysis: Secondary | ICD-10-CM | POA: Diagnosis not present

## 2021-11-30 DIAGNOSIS — N2581 Secondary hyperparathyroidism of renal origin: Secondary | ICD-10-CM | POA: Diagnosis not present

## 2021-12-02 DIAGNOSIS — N186 End stage renal disease: Secondary | ICD-10-CM | POA: Diagnosis not present

## 2021-12-02 DIAGNOSIS — N2581 Secondary hyperparathyroidism of renal origin: Secondary | ICD-10-CM | POA: Diagnosis not present

## 2021-12-02 DIAGNOSIS — Z992 Dependence on renal dialysis: Secondary | ICD-10-CM | POA: Diagnosis not present

## 2021-12-05 DIAGNOSIS — N2581 Secondary hyperparathyroidism of renal origin: Secondary | ICD-10-CM | POA: Diagnosis not present

## 2021-12-05 DIAGNOSIS — N186 End stage renal disease: Secondary | ICD-10-CM | POA: Diagnosis not present

## 2021-12-05 DIAGNOSIS — Z992 Dependence on renal dialysis: Secondary | ICD-10-CM | POA: Diagnosis not present

## 2021-12-06 DIAGNOSIS — N186 End stage renal disease: Secondary | ICD-10-CM | POA: Diagnosis not present

## 2021-12-06 DIAGNOSIS — Z992 Dependence on renal dialysis: Secondary | ICD-10-CM | POA: Diagnosis not present

## 2021-12-06 DIAGNOSIS — E1122 Type 2 diabetes mellitus with diabetic chronic kidney disease: Secondary | ICD-10-CM | POA: Diagnosis not present

## 2021-12-07 DIAGNOSIS — N2581 Secondary hyperparathyroidism of renal origin: Secondary | ICD-10-CM | POA: Diagnosis not present

## 2021-12-07 DIAGNOSIS — Z992 Dependence on renal dialysis: Secondary | ICD-10-CM | POA: Diagnosis not present

## 2021-12-07 DIAGNOSIS — N186 End stage renal disease: Secondary | ICD-10-CM | POA: Diagnosis not present

## 2021-12-09 DIAGNOSIS — N186 End stage renal disease: Secondary | ICD-10-CM | POA: Diagnosis not present

## 2021-12-09 DIAGNOSIS — N2581 Secondary hyperparathyroidism of renal origin: Secondary | ICD-10-CM | POA: Diagnosis not present

## 2021-12-09 DIAGNOSIS — Z992 Dependence on renal dialysis: Secondary | ICD-10-CM | POA: Diagnosis not present

## 2021-12-12 DIAGNOSIS — Z992 Dependence on renal dialysis: Secondary | ICD-10-CM | POA: Diagnosis not present

## 2021-12-12 DIAGNOSIS — N2581 Secondary hyperparathyroidism of renal origin: Secondary | ICD-10-CM | POA: Diagnosis not present

## 2021-12-12 DIAGNOSIS — N186 End stage renal disease: Secondary | ICD-10-CM | POA: Diagnosis not present

## 2021-12-13 ENCOUNTER — Other Ambulatory Visit: Payer: Self-pay

## 2021-12-13 ENCOUNTER — Ambulatory Visit: Payer: Medicare HMO | Admitting: Podiatry

## 2021-12-13 ENCOUNTER — Encounter: Payer: Self-pay | Admitting: Podiatry

## 2021-12-13 DIAGNOSIS — M79675 Pain in left toe(s): Secondary | ICD-10-CM

## 2021-12-13 DIAGNOSIS — M79674 Pain in right toe(s): Secondary | ICD-10-CM | POA: Diagnosis not present

## 2021-12-13 DIAGNOSIS — B351 Tinea unguium: Secondary | ICD-10-CM

## 2021-12-13 DIAGNOSIS — N184 Chronic kidney disease, stage 4 (severe): Secondary | ICD-10-CM

## 2021-12-13 DIAGNOSIS — E118 Type 2 diabetes mellitus with unspecified complications: Secondary | ICD-10-CM

## 2021-12-13 DIAGNOSIS — E1122 Type 2 diabetes mellitus with diabetic chronic kidney disease: Secondary | ICD-10-CM

## 2021-12-13 NOTE — Progress Notes (Signed)
This patient returns to my office for at risk foot care.  This patient requires this care by a professional since this patient will be at risk due to having CKD, and type 2 diabetes  This patient is unable to cut nails himself since the patient cannot reach his nails.These nails are painful walking and wearing shoes.  This patient presents for at risk foot care today.  General Appearance  Alert, conversant and in no acute stress.  Vascular  Dorsalis pedis and posterior tibial  pulses are weakly palpable  bilaterally.  Capillary return is within normal limits  bilaterally. Temperature is within normal limits  bilaterally.  Neurologic  Senn-Weinstein monofilament wire test within normal limits  bilaterally. Muscle power within normal limits bilaterally.  Nails Thick disfigured discolored nails with subungual debris  from hallux to fifth toes bilaterally. No evidence of bacterial infection or drainage bilaterally.  Orthopedic  No limitations of motion  feet .  No crepitus or effusions noted.  No bony pathology or digital deformities noted. HAV  B/L.  Skin  normotropic skin with no porokeratosis noted bilaterally.  No signs of infections or ulcers noted.     Onychomycosis  Pain in right toes  Pain in left toes  Consent was obtained for treatment procedures.   Mechanical debridement of nails 1-5  bilaterally performed with a nail nipper.  Filed with dremel without incident.    Return office visit    3 months                 Told patient to return for periodic foot care and evaluation due to potential at risk complications.   Raine Elsass DPM  

## 2021-12-14 DIAGNOSIS — N186 End stage renal disease: Secondary | ICD-10-CM | POA: Diagnosis not present

## 2021-12-14 DIAGNOSIS — N2581 Secondary hyperparathyroidism of renal origin: Secondary | ICD-10-CM | POA: Diagnosis not present

## 2021-12-14 DIAGNOSIS — Z992 Dependence on renal dialysis: Secondary | ICD-10-CM | POA: Diagnosis not present

## 2021-12-16 DIAGNOSIS — N186 End stage renal disease: Secondary | ICD-10-CM | POA: Diagnosis not present

## 2021-12-16 DIAGNOSIS — Z992 Dependence on renal dialysis: Secondary | ICD-10-CM | POA: Diagnosis not present

## 2021-12-16 DIAGNOSIS — N2581 Secondary hyperparathyroidism of renal origin: Secondary | ICD-10-CM | POA: Diagnosis not present

## 2021-12-19 DIAGNOSIS — N186 End stage renal disease: Secondary | ICD-10-CM | POA: Diagnosis not present

## 2021-12-19 DIAGNOSIS — N2581 Secondary hyperparathyroidism of renal origin: Secondary | ICD-10-CM | POA: Diagnosis not present

## 2021-12-19 DIAGNOSIS — Z992 Dependence on renal dialysis: Secondary | ICD-10-CM | POA: Diagnosis not present

## 2021-12-21 DIAGNOSIS — N186 End stage renal disease: Secondary | ICD-10-CM | POA: Diagnosis not present

## 2021-12-21 DIAGNOSIS — Z992 Dependence on renal dialysis: Secondary | ICD-10-CM | POA: Diagnosis not present

## 2021-12-21 DIAGNOSIS — N2581 Secondary hyperparathyroidism of renal origin: Secondary | ICD-10-CM | POA: Diagnosis not present

## 2021-12-23 DIAGNOSIS — N186 End stage renal disease: Secondary | ICD-10-CM | POA: Diagnosis not present

## 2021-12-23 DIAGNOSIS — Z992 Dependence on renal dialysis: Secondary | ICD-10-CM | POA: Diagnosis not present

## 2021-12-23 DIAGNOSIS — N2581 Secondary hyperparathyroidism of renal origin: Secondary | ICD-10-CM | POA: Diagnosis not present

## 2021-12-26 DIAGNOSIS — Z992 Dependence on renal dialysis: Secondary | ICD-10-CM | POA: Diagnosis not present

## 2021-12-26 DIAGNOSIS — N2581 Secondary hyperparathyroidism of renal origin: Secondary | ICD-10-CM | POA: Diagnosis not present

## 2021-12-26 DIAGNOSIS — N186 End stage renal disease: Secondary | ICD-10-CM | POA: Diagnosis not present

## 2021-12-28 DIAGNOSIS — Z992 Dependence on renal dialysis: Secondary | ICD-10-CM | POA: Diagnosis not present

## 2021-12-28 DIAGNOSIS — N2581 Secondary hyperparathyroidism of renal origin: Secondary | ICD-10-CM | POA: Diagnosis not present

## 2021-12-28 DIAGNOSIS — N186 End stage renal disease: Secondary | ICD-10-CM | POA: Diagnosis not present

## 2021-12-29 DIAGNOSIS — N186 End stage renal disease: Secondary | ICD-10-CM | POA: Diagnosis not present

## 2021-12-29 DIAGNOSIS — Z992 Dependence on renal dialysis: Secondary | ICD-10-CM | POA: Diagnosis not present

## 2021-12-29 DIAGNOSIS — I871 Compression of vein: Secondary | ICD-10-CM | POA: Diagnosis not present

## 2021-12-29 DIAGNOSIS — T82858A Stenosis of vascular prosthetic devices, implants and grafts, initial encounter: Secondary | ICD-10-CM | POA: Diagnosis not present

## 2021-12-30 DIAGNOSIS — N2581 Secondary hyperparathyroidism of renal origin: Secondary | ICD-10-CM | POA: Diagnosis not present

## 2021-12-30 DIAGNOSIS — Z992 Dependence on renal dialysis: Secondary | ICD-10-CM | POA: Diagnosis not present

## 2021-12-30 DIAGNOSIS — N186 End stage renal disease: Secondary | ICD-10-CM | POA: Diagnosis not present

## 2022-01-02 DIAGNOSIS — Z992 Dependence on renal dialysis: Secondary | ICD-10-CM | POA: Diagnosis not present

## 2022-01-02 DIAGNOSIS — N186 End stage renal disease: Secondary | ICD-10-CM | POA: Diagnosis not present

## 2022-01-02 DIAGNOSIS — N2581 Secondary hyperparathyroidism of renal origin: Secondary | ICD-10-CM | POA: Diagnosis not present

## 2022-01-04 DIAGNOSIS — N2581 Secondary hyperparathyroidism of renal origin: Secondary | ICD-10-CM | POA: Diagnosis not present

## 2022-01-04 DIAGNOSIS — N186 End stage renal disease: Secondary | ICD-10-CM | POA: Diagnosis not present

## 2022-01-04 DIAGNOSIS — Z992 Dependence on renal dialysis: Secondary | ICD-10-CM | POA: Diagnosis not present

## 2022-01-06 DIAGNOSIS — Z992 Dependence on renal dialysis: Secondary | ICD-10-CM | POA: Diagnosis not present

## 2022-01-06 DIAGNOSIS — N2581 Secondary hyperparathyroidism of renal origin: Secondary | ICD-10-CM | POA: Diagnosis not present

## 2022-01-06 DIAGNOSIS — N186 End stage renal disease: Secondary | ICD-10-CM | POA: Diagnosis not present

## 2022-01-06 DIAGNOSIS — E1122 Type 2 diabetes mellitus with diabetic chronic kidney disease: Secondary | ICD-10-CM | POA: Diagnosis not present

## 2022-01-09 DIAGNOSIS — Z992 Dependence on renal dialysis: Secondary | ICD-10-CM | POA: Diagnosis not present

## 2022-01-09 DIAGNOSIS — N186 End stage renal disease: Secondary | ICD-10-CM | POA: Diagnosis not present

## 2022-01-09 DIAGNOSIS — N2581 Secondary hyperparathyroidism of renal origin: Secondary | ICD-10-CM | POA: Diagnosis not present

## 2022-01-10 DIAGNOSIS — E1165 Type 2 diabetes mellitus with hyperglycemia: Secondary | ICD-10-CM | POA: Diagnosis not present

## 2022-01-10 DIAGNOSIS — E291 Testicular hypofunction: Secondary | ICD-10-CM | POA: Diagnosis not present

## 2022-01-10 DIAGNOSIS — E611 Iron deficiency: Secondary | ICD-10-CM | POA: Diagnosis not present

## 2022-01-10 DIAGNOSIS — I5032 Chronic diastolic (congestive) heart failure: Secondary | ICD-10-CM | POA: Diagnosis not present

## 2022-01-10 DIAGNOSIS — R7989 Other specified abnormal findings of blood chemistry: Secondary | ICD-10-CM | POA: Diagnosis not present

## 2022-01-10 DIAGNOSIS — Z125 Encounter for screening for malignant neoplasm of prostate: Secondary | ICD-10-CM | POA: Diagnosis not present

## 2022-01-10 DIAGNOSIS — E039 Hypothyroidism, unspecified: Secondary | ICD-10-CM | POA: Diagnosis not present

## 2022-01-10 DIAGNOSIS — E785 Hyperlipidemia, unspecified: Secondary | ICD-10-CM | POA: Diagnosis not present

## 2022-01-10 DIAGNOSIS — M109 Gout, unspecified: Secondary | ICD-10-CM | POA: Diagnosis not present

## 2022-01-11 DIAGNOSIS — N2581 Secondary hyperparathyroidism of renal origin: Secondary | ICD-10-CM | POA: Diagnosis not present

## 2022-01-11 DIAGNOSIS — N186 End stage renal disease: Secondary | ICD-10-CM | POA: Diagnosis not present

## 2022-01-11 DIAGNOSIS — Z992 Dependence on renal dialysis: Secondary | ICD-10-CM | POA: Diagnosis not present

## 2022-01-13 DIAGNOSIS — N186 End stage renal disease: Secondary | ICD-10-CM | POA: Diagnosis not present

## 2022-01-13 DIAGNOSIS — N2581 Secondary hyperparathyroidism of renal origin: Secondary | ICD-10-CM | POA: Diagnosis not present

## 2022-01-13 DIAGNOSIS — Z992 Dependence on renal dialysis: Secondary | ICD-10-CM | POA: Diagnosis not present

## 2022-01-15 NOTE — Progress Notes (Signed)
?Cardiology Office Note:   ? ?Date:  01/19/2022  ? ?ID:  David Nguyen, DOB August 05, 1941, MRN [id number] ? ?PCP:  [name], MD  ?Cardiologist:  Quay Burow, MD  ?Electrophysiologist:  None  ? ?Referring MD: [name], MD  ? ?Chief Complaint: follow-up of CHF and bradycardia ? ?History of Present Illness:   ? ?David Nguyen is a 81 y.o. male with a history of chronic diastolic CHF, bradycardia, hypertension, hyperlipidemia, type 2 diabetes myelitis, ESRD on hemodialysis M/W/F, hypothyroidism, and obstructive sleep apnea on CPAP who is followed by Dr. [name] and presents today for routine follow-up. ? ?Patient was admitted in 03/2019 with bradycardia.  Echo during admission showed LVEF of > 65% with moderate LVH and grade 3 diastolic dysfunction, bilateral atrial enlargement, mild AS, and moderately to severely elevated RVSP.  Bradycardia improved with decreasing beta-blocker.  Patient was admitted again in 04/2020 with concerns for symptomatic bradycardia although it was unclear whether symptoms were due to uremia or bradycardia.  He had 6 stage IV CKD at that time but had not started dialysis.  Beta-blocker was stopped and heart rate improved.  He was also started on a lower dose of Coreg at discharge. ? ?Patient was last seen by Dr. [name] in 12/2020 at which time he was doing well from a cardiac standpoint.  He did have an AV fistula placed at that time but had not yet started hemodialysis.  He denies any chest pain or shortness of breath. ? ?He was seen by Vascular Surgery in 08/2021 for evaluation of left leg pain which had been ongoing for years. ABIs were ordered and showed non-compressible arteries bilaterally unchanged from prior study in 12/2019.  ? ?Patient presents today for follow-up. Here with wife. Since last visit, patient has started hemodialysis on M/W/F.  His BP has been softer since starting dialysis.  His Amlodipine was decreased to 5 mg daily last week and his Cardura was also  decreased this week.  His BP is soft in the office today at 96/52 but patient is asymptomatic with this.  He denies any chest pain, shortness of breath, orthopnea, PND, lower extremity edema, palpitations, lightheadedness, dizziness, syncope. His only main complaint today is chronic left anterior leg pain which he has had for years (and for which he saw Vascular Surgery for in 08/2021) and chronic back pain.  ? ?He has a history of bradycardia and wife states that heart rate is often 57 bpm. She describes one episode with heart rates in the 40s but mostly in the higher 50s. Rates in the mid 22s today. ? ?Past Medical History:  ?Diagnosis Date  ? Allergic rhinitis   ? Anemia   ? low iron  ? Arthritis   ? left leg  ? Bradycardia 03/2019  ? Chronic kidney disease   ? Followed by Dr Deterding    ? Diabetes mellitus   ? Type II  ? Horseshoe kidney   ? Hyperlipidemia   ? Hypertension   ? Hypothyroidism   ? Refusal of blood transfusions as patient is Jehovah's Witness   ? Scrotal edema   ? Sleep apnea   ? uses a cpap  ? ? ?Past Surgical History:  ?Procedure Laterality Date  ? A/V FISTULAGRAM Left 03/05/2020  ? Procedure: A/V FISTULAGRAM - Left Arm;  Surgeon: Angelia Mould, MD;  Location: [place] [facility];  Service: Cardiovascular;  Laterality: Left;  ? ADRENALECTOMY  1993  ? left  ? AV FISTULA PLACEMENT Left 05/06/2018  ?  Procedure: ARTERIOVENOUS (AV) FISTULA CREATION RADIOCEPHALIC;  Surgeon: [name], MD;  Location: [facility];  Service: Vascular;  Laterality: Left;  ? COLONOSCOPY W/ POLYPECTOMY    ? INGUINAL HERNIA REPAIR Right 07/05/2020  ? Procedure: HERNIA REPAIR INGUINAL ADULT;  Surgeon: Coralie Keens, MD;  Location: [place];  Service: General;  Laterality: Right;  LMA  ? INSERTION OF MESH Right 07/05/2020  ? Procedure: INSERTION OF MESH;  Surgeon: Coralie Keens, MD;  Location: [place];  Service: General;  Laterality: Right;  ? REVISION OF ARTERIOVENOUS GORETEX GRAFT Left 07/11/2018  ? Procedure:  TRANSPOSITION OF RADIOCEPHALIC  ARTERIOVENOUS FISTULA LEFT ARM;  Surgeon: [name], MD;  Location[facility];  Service: Vascular;  Laterality: Left;  ? REVISION OF ARTERIOVENOUS GORETEX GRAFT Left 03/12/2020  ? Procedure: REVISION OF LEFT RADIOCEPHALIC FISTULA;  Surgeon: [name], MD;  Location: [place];  Service: Vascular;  Laterality: Left;  ? ? ?Current Medications: ?Current Meds  ?Medication Sig  ? ACCU-CHEK GUIDE test strip   ? acetaminophen (TYLENOL) 650 MG CR tablet Take 650 mg by mouth daily as needed for pain.   ? allopurinol (ZYLOPRIM) 100 MG tablet Take 200 mg by mouth daily.   ? amLODipine (NORVASC) 10 MG tablet Take 0.5 tablets by mouth daily.  ? Ascorbic Acid (VITAMIN C) 1000 MG tablet Take 1,000 mg by mouth daily.  ? aspirin EC 81 MG tablet Take 81 mg by mouth daily.  ? atorvastatin (LIPITOR) 40 MG tablet Take 40 mg by mouth at bedtime.   ? Blood Pressure Monitoring (OMRON 7 SERIES BP MONITOR) DEVI Chesk bp once a day   DX  I95.1, I10  ? calcitRIOL (ROCALTROL) 0.25 MCG capsule Take 0.25 mcg by mouth daily.  ? carvedilol (COREG) 3.125 MG tablet Take 1 tablet (3.125 mg total) by mouth 2 (two) times daily with a meal.  ? Docusate Sodium (DSS) 100 MG CAPS Take 100 mg by mouth daily.  ? doxazosin (CARDURA) 8 MG tablet Take 0.5 tablets by mouth at bedtime.  ? DROPLET PEN NEEDLES 31G X 8 MM MISC   ? montelukast (SINGULAIR) 10 MG tablet Take 10 mg by mouth daily.  ? Multiple Vitamin (MULTIVITAMIN WITH MINERALS) TABS tablet Take 1 tablet by mouth daily.  ? PRESCRIPTION MEDICATION Inhale into the lungs at bedtime. CPAP  ? traMADol (ULTRAM) 50 MG tablet Take 1 tablet (50 mg total) by mouth every 6 (six) hours as needed for moderate pain or severe pain.  ? Vitamin D3 (VITAMIN D) 25 MCG tablet Take 1,000 Units by mouth daily.  ?  ? ?Allergies:   Penicillins  ? ?Social History  ? ?Socioeconomic History  ? Marital status: Divorced  ?  Spouse name: Not on file  ? Number of children: 5  ? Years of education: Not on  file  ? Highest education level: Not on file  ?Occupational History  ? Occupation: retired  ?  Comment: [employer]  ?Tobacco Use  ? Smoking status: Former  ?  Packs/day: 0.30  ?  Years: 3.00  ?  Pack years: 0.90  ?  Types: Cigarettes  ?  Quit date: 10/09/1968  ?  Years since quitting: 53.3  ? Smokeless tobacco: Never  ?Vaping Use  ? Vaping Use: Never used  ?Substance and Sexual Activity  ? Alcohol use: No  ? Drug use: No  ? Sexual activity: Not on file  ?Other Topics Concern  ? Not on file  ?Social History Narrative  ? Not on  file  ? ?Social Determinants of Health  ? ?Financial Resource Strain: Not on file  ?Food Insecurity: Not on file  ?Transportation Needs: Not on file  ?Physical Activity: Not on file  ?Stress: Not on file  ?Social Connections: Not on file  ?  ? ?Family History: ?The patient's family history includes Kidney failure in his mother; Lung cancer in his sister; Prostate cancer in his father. ? ?ROS:   ?Please see the history of present illness.    ? ?EKGs/Labs/Other Studies Reviewed:   ? ?The following studies were reviewed today: ? ?Echocardiogram 04/01/2019: ?Impressions: ? 1. The left ventricle has hyperdynamic systolic function, with an  ?ejection fraction of >65%. The cavity size was decreased. There is  ?moderate concentric left ventricular hypertrophy. Left ventricular  ?diastolic Doppler parameters are consistent with restrictive filling. Elevated left atrial and left ventricular end-diastolic pressures.  ? 2. The right ventricle has normal systolic function. The cavity was  ?normal. There is no increase in right ventricular wall thickness. Right  ?ventricular systolic pressure moderate to severely elevated.  ? 3. Left atrial size was severely dilated.  ? 4. Right atrial size was moderately dilated.  ? 5. Small pericardial effusion.  ? 6. The pericardial effusion is posterior to the left ventricle.  ? 7. Mild thickening of the mitral valve leaflet. There is moderate mitral  ?annular calcification  present. No evidence of mitral valve stenosis.  ? 8. The aortic valve is tricuspid. Mild calcification of the aortic valve.  ?Aortic valve regurgitation is trivial by color flow Doppler. Mild stenosis  ?of the aortic v

## 2022-01-16 DIAGNOSIS — Z992 Dependence on renal dialysis: Secondary | ICD-10-CM | POA: Diagnosis not present

## 2022-01-16 DIAGNOSIS — N186 End stage renal disease: Secondary | ICD-10-CM | POA: Diagnosis not present

## 2022-01-16 DIAGNOSIS — N2581 Secondary hyperparathyroidism of renal origin: Secondary | ICD-10-CM | POA: Diagnosis not present

## 2022-01-17 DIAGNOSIS — E1129 Type 2 diabetes mellitus with other diabetic kidney complication: Secondary | ICD-10-CM | POA: Diagnosis not present

## 2022-01-17 DIAGNOSIS — Z0000 Encounter for general adult medical examination without abnormal findings: Secondary | ICD-10-CM | POA: Diagnosis not present

## 2022-01-17 DIAGNOSIS — I130 Hypertensive heart and chronic kidney disease with heart failure and stage 1 through stage 4 chronic kidney disease, or unspecified chronic kidney disease: Secondary | ICD-10-CM | POA: Diagnosis not present

## 2022-01-17 DIAGNOSIS — D631 Anemia in chronic kidney disease: Secondary | ICD-10-CM | POA: Diagnosis not present

## 2022-01-17 DIAGNOSIS — R82998 Other abnormal findings in urine: Secondary | ICD-10-CM | POA: Diagnosis not present

## 2022-01-17 DIAGNOSIS — I872 Venous insufficiency (chronic) (peripheral): Secondary | ICD-10-CM | POA: Diagnosis not present

## 2022-01-17 DIAGNOSIS — I5032 Chronic diastolic (congestive) heart failure: Secondary | ICD-10-CM | POA: Diagnosis not present

## 2022-01-17 DIAGNOSIS — N186 End stage renal disease: Secondary | ICD-10-CM | POA: Diagnosis not present

## 2022-01-17 DIAGNOSIS — Z992 Dependence on renal dialysis: Secondary | ICD-10-CM | POA: Diagnosis not present

## 2022-01-18 DIAGNOSIS — N2581 Secondary hyperparathyroidism of renal origin: Secondary | ICD-10-CM | POA: Diagnosis not present

## 2022-01-18 DIAGNOSIS — Z1212 Encounter for screening for malignant neoplasm of rectum: Secondary | ICD-10-CM | POA: Diagnosis not present

## 2022-01-18 DIAGNOSIS — Z992 Dependence on renal dialysis: Secondary | ICD-10-CM | POA: Diagnosis not present

## 2022-01-18 DIAGNOSIS — N186 End stage renal disease: Secondary | ICD-10-CM | POA: Diagnosis not present

## 2022-01-19 ENCOUNTER — Encounter: Payer: Self-pay | Admitting: Student

## 2022-01-19 ENCOUNTER — Ambulatory Visit: Payer: Medicare HMO | Admitting: Student

## 2022-01-19 VITALS — BP 96/52 | HR 65 | Ht 72.0 in | Wt 206.2 lb

## 2022-01-19 DIAGNOSIS — E119 Type 2 diabetes mellitus without complications: Secondary | ICD-10-CM | POA: Diagnosis not present

## 2022-01-19 DIAGNOSIS — N186 End stage renal disease: Secondary | ICD-10-CM

## 2022-01-19 DIAGNOSIS — Z87898 Personal history of other specified conditions: Secondary | ICD-10-CM

## 2022-01-19 DIAGNOSIS — Z794 Long term (current) use of insulin: Secondary | ICD-10-CM

## 2022-01-19 DIAGNOSIS — E785 Hyperlipidemia, unspecified: Secondary | ICD-10-CM

## 2022-01-19 DIAGNOSIS — I5032 Chronic diastolic (congestive) heart failure: Secondary | ICD-10-CM

## 2022-01-19 DIAGNOSIS — I10 Essential (primary) hypertension: Secondary | ICD-10-CM

## 2022-01-19 NOTE — Patient Instructions (Signed)
Medication Instructions:  ?Stop Amlodipine ?*If you need a refill on your cardiac medications before your next appointment, please call your pharmacy* ? ? ?Lab Work: ?None ?If you have labs (blood work) drawn today and your tests are completely normal, you will receive your results only by: ?MyChart Message (if you have MyChart) OR ?A paper copy in the mail ?If you have any lab test that is abnormal or we need to change your treatment, we will call you to review the results. ? ? ?Testing/Procedures: ?None ? ? ?Follow-Up: ?At [facility], you and your health needs are our priority.  As part of our continuing mission to provide you with exceptional heart care, we have created designated Provider Care Teams.  These Care Teams include your primary Cardiologist (physician) and Advanced Practice Providers (APPs -  Physician Assistants and Nurse Practitioners) who all work together to provide you with the care you need, when you need it. ? ?We recommend signing up for the patient portal called "MyChart".  Sign up information is provided on this After Visit Summary.  MyChart is used to connect with patients for Virtual Visits (Telemedicine).  Patients are able to view lab/test results, encounter notes, upcoming appointments, etc.  Non-urgent messages can be sent to your provider as well.   ?To learn more about what you can do with MyChart, go to [email or web address].   ? ?Your next appointment:   ?6 month(s) ? ?The format for your next appointment:   ?In Person ? ?Provider:   ?Quay Burow, MD   ? ? ? ? ?Important Information About Sugar ? ? ? ? ?  ?

## 2022-01-20 DIAGNOSIS — N186 End stage renal disease: Secondary | ICD-10-CM | POA: Diagnosis not present

## 2022-01-20 DIAGNOSIS — Z992 Dependence on renal dialysis: Secondary | ICD-10-CM | POA: Diagnosis not present

## 2022-01-20 DIAGNOSIS — N2581 Secondary hyperparathyroidism of renal origin: Secondary | ICD-10-CM | POA: Diagnosis not present

## 2022-01-23 DIAGNOSIS — Z992 Dependence on renal dialysis: Secondary | ICD-10-CM | POA: Diagnosis not present

## 2022-01-23 DIAGNOSIS — N186 End stage renal disease: Secondary | ICD-10-CM | POA: Diagnosis not present

## 2022-01-23 DIAGNOSIS — N2581 Secondary hyperparathyroidism of renal origin: Secondary | ICD-10-CM | POA: Diagnosis not present

## 2022-01-25 DIAGNOSIS — Z992 Dependence on renal dialysis: Secondary | ICD-10-CM | POA: Diagnosis not present

## 2022-01-25 DIAGNOSIS — N186 End stage renal disease: Secondary | ICD-10-CM | POA: Diagnosis not present

## 2022-01-25 DIAGNOSIS — N2581 Secondary hyperparathyroidism of renal origin: Secondary | ICD-10-CM | POA: Diagnosis not present

## 2022-01-27 DIAGNOSIS — N186 End stage renal disease: Secondary | ICD-10-CM | POA: Diagnosis not present

## 2022-01-27 DIAGNOSIS — Z992 Dependence on renal dialysis: Secondary | ICD-10-CM | POA: Diagnosis not present

## 2022-01-27 DIAGNOSIS — N2581 Secondary hyperparathyroidism of renal origin: Secondary | ICD-10-CM | POA: Diagnosis not present

## 2022-01-30 DIAGNOSIS — Z992 Dependence on renal dialysis: Secondary | ICD-10-CM | POA: Diagnosis not present

## 2022-01-30 DIAGNOSIS — N186 End stage renal disease: Secondary | ICD-10-CM | POA: Diagnosis not present

## 2022-01-30 DIAGNOSIS — N2581 Secondary hyperparathyroidism of renal origin: Secondary | ICD-10-CM | POA: Diagnosis not present

## 2022-02-01 DIAGNOSIS — Z992 Dependence on renal dialysis: Secondary | ICD-10-CM | POA: Diagnosis not present

## 2022-02-01 DIAGNOSIS — N186 End stage renal disease: Secondary | ICD-10-CM | POA: Diagnosis not present

## 2022-02-01 DIAGNOSIS — N2581 Secondary hyperparathyroidism of renal origin: Secondary | ICD-10-CM | POA: Diagnosis not present

## 2022-02-03 DIAGNOSIS — N2581 Secondary hyperparathyroidism of renal origin: Secondary | ICD-10-CM | POA: Diagnosis not present

## 2022-02-03 DIAGNOSIS — N186 End stage renal disease: Secondary | ICD-10-CM | POA: Diagnosis not present

## 2022-02-03 DIAGNOSIS — Z992 Dependence on renal dialysis: Secondary | ICD-10-CM | POA: Diagnosis not present

## 2022-02-05 DIAGNOSIS — E1122 Type 2 diabetes mellitus with diabetic chronic kidney disease: Secondary | ICD-10-CM | POA: Diagnosis not present

## 2022-02-05 DIAGNOSIS — N186 End stage renal disease: Secondary | ICD-10-CM | POA: Diagnosis not present

## 2022-02-05 DIAGNOSIS — Z992 Dependence on renal dialysis: Secondary | ICD-10-CM | POA: Diagnosis not present

## 2022-02-06 DIAGNOSIS — Z992 Dependence on renal dialysis: Secondary | ICD-10-CM | POA: Diagnosis not present

## 2022-02-06 DIAGNOSIS — N186 End stage renal disease: Secondary | ICD-10-CM | POA: Diagnosis not present

## 2022-02-06 DIAGNOSIS — N2581 Secondary hyperparathyroidism of renal origin: Secondary | ICD-10-CM | POA: Diagnosis not present

## 2022-02-08 DIAGNOSIS — N186 End stage renal disease: Secondary | ICD-10-CM | POA: Diagnosis not present

## 2022-02-08 DIAGNOSIS — N2581 Secondary hyperparathyroidism of renal origin: Secondary | ICD-10-CM | POA: Diagnosis not present

## 2022-02-08 DIAGNOSIS — Z992 Dependence on renal dialysis: Secondary | ICD-10-CM | POA: Diagnosis not present

## 2022-02-10 DIAGNOSIS — Z992 Dependence on renal dialysis: Secondary | ICD-10-CM | POA: Diagnosis not present

## 2022-02-10 DIAGNOSIS — N2581 Secondary hyperparathyroidism of renal origin: Secondary | ICD-10-CM | POA: Diagnosis not present

## 2022-02-10 DIAGNOSIS — N186 End stage renal disease: Secondary | ICD-10-CM | POA: Diagnosis not present

## 2022-02-13 DIAGNOSIS — N186 End stage renal disease: Secondary | ICD-10-CM | POA: Diagnosis not present

## 2022-02-13 DIAGNOSIS — N2581 Secondary hyperparathyroidism of renal origin: Secondary | ICD-10-CM | POA: Diagnosis not present

## 2022-02-13 DIAGNOSIS — Z992 Dependence on renal dialysis: Secondary | ICD-10-CM | POA: Diagnosis not present

## 2022-02-14 DIAGNOSIS — L814 Other melanin hyperpigmentation: Secondary | ICD-10-CM | POA: Diagnosis not present

## 2022-02-14 DIAGNOSIS — L821 Other seborrheic keratosis: Secondary | ICD-10-CM | POA: Diagnosis not present

## 2022-02-14 DIAGNOSIS — L280 Lichen simplex chronicus: Secondary | ICD-10-CM | POA: Diagnosis not present

## 2022-02-14 DIAGNOSIS — D485 Neoplasm of uncertain behavior of skin: Secondary | ICD-10-CM | POA: Diagnosis not present

## 2022-02-15 DIAGNOSIS — N186 End stage renal disease: Secondary | ICD-10-CM | POA: Diagnosis not present

## 2022-02-15 DIAGNOSIS — N2581 Secondary hyperparathyroidism of renal origin: Secondary | ICD-10-CM | POA: Diagnosis not present

## 2022-02-15 DIAGNOSIS — Z992 Dependence on renal dialysis: Secondary | ICD-10-CM | POA: Diagnosis not present

## 2022-02-17 DIAGNOSIS — N186 End stage renal disease: Secondary | ICD-10-CM | POA: Diagnosis not present

## 2022-02-17 DIAGNOSIS — N2581 Secondary hyperparathyroidism of renal origin: Secondary | ICD-10-CM | POA: Diagnosis not present

## 2022-02-17 DIAGNOSIS — Z992 Dependence on renal dialysis: Secondary | ICD-10-CM | POA: Diagnosis not present

## 2022-02-20 DIAGNOSIS — N186 End stage renal disease: Secondary | ICD-10-CM | POA: Diagnosis not present

## 2022-02-20 DIAGNOSIS — N2581 Secondary hyperparathyroidism of renal origin: Secondary | ICD-10-CM | POA: Diagnosis not present

## 2022-02-20 DIAGNOSIS — Z992 Dependence on renal dialysis: Secondary | ICD-10-CM | POA: Diagnosis not present

## 2022-02-22 DIAGNOSIS — N2581 Secondary hyperparathyroidism of renal origin: Secondary | ICD-10-CM | POA: Diagnosis not present

## 2022-02-22 DIAGNOSIS — Z992 Dependence on renal dialysis: Secondary | ICD-10-CM | POA: Diagnosis not present

## 2022-02-22 DIAGNOSIS — N186 End stage renal disease: Secondary | ICD-10-CM | POA: Diagnosis not present

## 2022-02-23 DIAGNOSIS — L942 Calcinosis cutis: Secondary | ICD-10-CM | POA: Diagnosis not present

## 2022-02-23 DIAGNOSIS — Z992 Dependence on renal dialysis: Secondary | ICD-10-CM | POA: Diagnosis not present

## 2022-02-23 DIAGNOSIS — E1129 Type 2 diabetes mellitus with other diabetic kidney complication: Secondary | ICD-10-CM | POA: Diagnosis not present

## 2022-02-23 DIAGNOSIS — I5032 Chronic diastolic (congestive) heart failure: Secondary | ICD-10-CM | POA: Diagnosis not present

## 2022-02-23 DIAGNOSIS — N186 End stage renal disease: Secondary | ICD-10-CM | POA: Diagnosis not present

## 2022-02-23 DIAGNOSIS — G4733 Obstructive sleep apnea (adult) (pediatric): Secondary | ICD-10-CM | POA: Diagnosis not present

## 2022-02-23 DIAGNOSIS — I872 Venous insufficiency (chronic) (peripheral): Secondary | ICD-10-CM | POA: Diagnosis not present

## 2022-02-23 DIAGNOSIS — I130 Hypertensive heart and chronic kidney disease with heart failure and stage 1 through stage 4 chronic kidney disease, or unspecified chronic kidney disease: Secondary | ICD-10-CM | POA: Diagnosis not present

## 2022-02-24 DIAGNOSIS — N2581 Secondary hyperparathyroidism of renal origin: Secondary | ICD-10-CM | POA: Diagnosis not present

## 2022-02-24 DIAGNOSIS — Z992 Dependence on renal dialysis: Secondary | ICD-10-CM | POA: Diagnosis not present

## 2022-02-24 DIAGNOSIS — N186 End stage renal disease: Secondary | ICD-10-CM | POA: Diagnosis not present

## 2022-02-27 DIAGNOSIS — N2581 Secondary hyperparathyroidism of renal origin: Secondary | ICD-10-CM | POA: Diagnosis not present

## 2022-02-27 DIAGNOSIS — Z992 Dependence on renal dialysis: Secondary | ICD-10-CM | POA: Diagnosis not present

## 2022-02-27 DIAGNOSIS — N186 End stage renal disease: Secondary | ICD-10-CM | POA: Diagnosis not present

## 2022-03-01 DIAGNOSIS — Z992 Dependence on renal dialysis: Secondary | ICD-10-CM | POA: Diagnosis not present

## 2022-03-01 DIAGNOSIS — N186 End stage renal disease: Secondary | ICD-10-CM | POA: Diagnosis not present

## 2022-03-01 DIAGNOSIS — N2581 Secondary hyperparathyroidism of renal origin: Secondary | ICD-10-CM | POA: Diagnosis not present

## 2022-03-02 DIAGNOSIS — D631 Anemia in chronic kidney disease: Secondary | ICD-10-CM | POA: Diagnosis not present

## 2022-03-02 DIAGNOSIS — N186 End stage renal disease: Secondary | ICD-10-CM | POA: Diagnosis not present

## 2022-03-02 DIAGNOSIS — Z992 Dependence on renal dialysis: Secondary | ICD-10-CM | POA: Diagnosis not present

## 2022-03-02 DIAGNOSIS — R42 Dizziness and giddiness: Secondary | ICD-10-CM | POA: Diagnosis not present

## 2022-03-02 DIAGNOSIS — R531 Weakness: Secondary | ICD-10-CM | POA: Diagnosis not present

## 2022-03-03 DIAGNOSIS — Z992 Dependence on renal dialysis: Secondary | ICD-10-CM | POA: Diagnosis not present

## 2022-03-03 DIAGNOSIS — N186 End stage renal disease: Secondary | ICD-10-CM | POA: Diagnosis not present

## 2022-03-03 DIAGNOSIS — N2581 Secondary hyperparathyroidism of renal origin: Secondary | ICD-10-CM | POA: Diagnosis not present

## 2022-03-06 DIAGNOSIS — N186 End stage renal disease: Secondary | ICD-10-CM | POA: Diagnosis not present

## 2022-03-06 DIAGNOSIS — N2581 Secondary hyperparathyroidism of renal origin: Secondary | ICD-10-CM | POA: Diagnosis not present

## 2022-03-06 DIAGNOSIS — Z992 Dependence on renal dialysis: Secondary | ICD-10-CM | POA: Diagnosis not present

## 2022-03-08 DIAGNOSIS — Z992 Dependence on renal dialysis: Secondary | ICD-10-CM | POA: Diagnosis not present

## 2022-03-08 DIAGNOSIS — N2581 Secondary hyperparathyroidism of renal origin: Secondary | ICD-10-CM | POA: Diagnosis not present

## 2022-03-08 DIAGNOSIS — N186 End stage renal disease: Secondary | ICD-10-CM | POA: Diagnosis not present

## 2022-03-08 DIAGNOSIS — E1122 Type 2 diabetes mellitus with diabetic chronic kidney disease: Secondary | ICD-10-CM | POA: Diagnosis not present

## 2022-03-10 DIAGNOSIS — N186 End stage renal disease: Secondary | ICD-10-CM | POA: Diagnosis not present

## 2022-03-10 DIAGNOSIS — Z992 Dependence on renal dialysis: Secondary | ICD-10-CM | POA: Diagnosis not present

## 2022-03-10 DIAGNOSIS — N2581 Secondary hyperparathyroidism of renal origin: Secondary | ICD-10-CM | POA: Diagnosis not present

## 2022-03-13 DIAGNOSIS — N2581 Secondary hyperparathyroidism of renal origin: Secondary | ICD-10-CM | POA: Diagnosis not present

## 2022-03-13 DIAGNOSIS — Z992 Dependence on renal dialysis: Secondary | ICD-10-CM | POA: Diagnosis not present

## 2022-03-13 DIAGNOSIS — N186 End stage renal disease: Secondary | ICD-10-CM | POA: Diagnosis not present

## 2022-03-15 DIAGNOSIS — Z992 Dependence on renal dialysis: Secondary | ICD-10-CM | POA: Diagnosis not present

## 2022-03-15 DIAGNOSIS — N186 End stage renal disease: Secondary | ICD-10-CM | POA: Diagnosis not present

## 2022-03-15 DIAGNOSIS — N2581 Secondary hyperparathyroidism of renal origin: Secondary | ICD-10-CM | POA: Diagnosis not present

## 2022-03-17 DIAGNOSIS — N186 End stage renal disease: Secondary | ICD-10-CM | POA: Diagnosis not present

## 2022-03-17 DIAGNOSIS — N2581 Secondary hyperparathyroidism of renal origin: Secondary | ICD-10-CM | POA: Diagnosis not present

## 2022-03-17 DIAGNOSIS — Z992 Dependence on renal dialysis: Secondary | ICD-10-CM | POA: Diagnosis not present

## 2022-03-20 DIAGNOSIS — N2581 Secondary hyperparathyroidism of renal origin: Secondary | ICD-10-CM | POA: Diagnosis not present

## 2022-03-20 DIAGNOSIS — N186 End stage renal disease: Secondary | ICD-10-CM | POA: Diagnosis not present

## 2022-03-20 DIAGNOSIS — Z992 Dependence on renal dialysis: Secondary | ICD-10-CM | POA: Diagnosis not present

## 2022-03-21 ENCOUNTER — Ambulatory Visit (INDEPENDENT_AMBULATORY_CARE_PROVIDER_SITE_OTHER): Payer: Medicare HMO | Admitting: Podiatry

## 2022-03-21 ENCOUNTER — Encounter: Payer: Self-pay | Admitting: Podiatry

## 2022-03-21 DIAGNOSIS — M79674 Pain in right toe(s): Secondary | ICD-10-CM

## 2022-03-21 DIAGNOSIS — M79675 Pain in left toe(s): Secondary | ICD-10-CM

## 2022-03-21 DIAGNOSIS — B351 Tinea unguium: Secondary | ICD-10-CM

## 2022-03-21 DIAGNOSIS — N184 Chronic kidney disease, stage 4 (severe): Secondary | ICD-10-CM

## 2022-03-21 DIAGNOSIS — E118 Type 2 diabetes mellitus with unspecified complications: Secondary | ICD-10-CM

## 2022-03-21 DIAGNOSIS — E1122 Type 2 diabetes mellitus with diabetic chronic kidney disease: Secondary | ICD-10-CM

## 2022-03-21 NOTE — Progress Notes (Signed)
This patient returns to my office for at risk foot care.  This patient requires this care by a professional since this patient will be at risk due to having CKD, and type 2 diabetes  This patient is unable to cut nails himself since the patient cannot reach his nails.These nails are painful walking and wearing shoes.  This patient presents for at risk foot care today.  General Appearance  Alert, conversant and in no acute stress.  Vascular  Dorsalis pedis and posterior tibial  pulses are weakly palpable  bilaterally.  Capillary return is within normal limits  bilaterally. Temperature is within normal limits  bilaterally.  Neurologic  Senn-Weinstein monofilament wire test within normal limits  bilaterally. Muscle power within normal limits bilaterally.  Nails Thick disfigured discolored nails with subungual debris  from hallux to fifth toes bilaterally. No evidence of bacterial infection or drainage bilaterally.  Orthopedic  No limitations of motion  feet .  No crepitus or effusions noted.  No bony pathology or digital deformities noted. HAV  B/L.  Skin  normotropic skin with no porokeratosis noted bilaterally.  No signs of infections or ulcers noted.     Onychomycosis  Pain in right toes  Pain in left toes  Consent was obtained for treatment procedures.   Mechanical debridement of nails 1-5  bilaterally performed with a nail nipper.  Filed with dremel without incident.    Return office visit    3 months                 Told patient to return for periodic foot care and evaluation due to potential at risk complications.   Miyonna Ormiston DPM  

## 2022-03-22 DIAGNOSIS — N186 End stage renal disease: Secondary | ICD-10-CM | POA: Diagnosis not present

## 2022-03-22 DIAGNOSIS — Z992 Dependence on renal dialysis: Secondary | ICD-10-CM | POA: Diagnosis not present

## 2022-03-22 DIAGNOSIS — N2581 Secondary hyperparathyroidism of renal origin: Secondary | ICD-10-CM | POA: Diagnosis not present

## 2022-03-24 DIAGNOSIS — Z992 Dependence on renal dialysis: Secondary | ICD-10-CM | POA: Diagnosis not present

## 2022-03-24 DIAGNOSIS — N2581 Secondary hyperparathyroidism of renal origin: Secondary | ICD-10-CM | POA: Diagnosis not present

## 2022-03-24 DIAGNOSIS — N186 End stage renal disease: Secondary | ICD-10-CM | POA: Diagnosis not present

## 2022-03-27 DIAGNOSIS — N2581 Secondary hyperparathyroidism of renal origin: Secondary | ICD-10-CM | POA: Diagnosis not present

## 2022-03-27 DIAGNOSIS — Z992 Dependence on renal dialysis: Secondary | ICD-10-CM | POA: Diagnosis not present

## 2022-03-27 DIAGNOSIS — N186 End stage renal disease: Secondary | ICD-10-CM | POA: Diagnosis not present

## 2022-03-29 DIAGNOSIS — N186 End stage renal disease: Secondary | ICD-10-CM | POA: Diagnosis not present

## 2022-03-29 DIAGNOSIS — Z992 Dependence on renal dialysis: Secondary | ICD-10-CM | POA: Diagnosis not present

## 2022-03-29 DIAGNOSIS — N2581 Secondary hyperparathyroidism of renal origin: Secondary | ICD-10-CM | POA: Diagnosis not present

## 2022-03-30 ENCOUNTER — Encounter: Payer: Self-pay | Admitting: Internal Medicine

## 2022-03-30 ENCOUNTER — Ambulatory Visit (INDEPENDENT_AMBULATORY_CARE_PROVIDER_SITE_OTHER): Payer: Medicare HMO | Admitting: Internal Medicine

## 2022-03-30 ENCOUNTER — Other Ambulatory Visit: Payer: Self-pay

## 2022-03-30 DIAGNOSIS — L942 Calcinosis cutis: Secondary | ICD-10-CM | POA: Diagnosis not present

## 2022-03-30 DIAGNOSIS — I878 Other specified disorders of veins: Secondary | ICD-10-CM | POA: Insufficient documentation

## 2022-03-30 DIAGNOSIS — R229 Localized swelling, mass and lump, unspecified: Secondary | ICD-10-CM | POA: Diagnosis not present

## 2022-03-30 DIAGNOSIS — D485 Neoplasm of uncertain behavior of skin: Secondary | ICD-10-CM | POA: Diagnosis not present

## 2022-03-30 NOTE — Progress Notes (Signed)
[facility] for [facility]ious [facility]  Reason for Consult: Chronic left leg pain Referring Provider: Stephania Fragmin, PA  Assessment: I suspect that his pain is multifactorial.  I certainly think that his severe osteoarthritis is a contributor and encouraged him to follow-up with Dr. Erlinda Hong.  He may also have a component of chronic venous insufficiency contributing to lymphedema and discomfort.  I am not sure what the subcutaneous nodules are but I am hopeful that yesterday's biopsy will be revealing.  I asked his wife to message me after he follows up with his dermatologist.  I certainly do not see any evidence of active infection contributing to pain.  Plan: Await results of skin biopsy  Patient Active Problem List   Diagnosis Date Noted   Left leg pain 08/16/2021    Priority: High   Pain due to onychomycosis of toenails of both feet 02/07/2021   Symptomatic bradycardia 04/20/2020   Scrotal edema 04/20/2020   Primary osteoarthritis of left knee 08/14/2019   Chronic diastolic CHF (congestive heart failure) ([place]) 04/04/2019   Pulmonary hypertension ([place]) 04/04/2019   Diabetes mellitus type 2, controlled, with complications ([place]) 07/25/5101   Anemia of chronic disease 04/04/2019   Horseshoe kidney    Bradycardia 03/31/2019   Hypothyroidism 03/31/2019   End-stage renal disease on hemodialysis ([place]) 04/15/2018   History of anemia due to CKD 04/15/2018   Morbid obesity due to excess calories ([place]) 07/13/2017   HYPERLIPIDEMIA 05/26/2008   Essential hypertension 05/26/2008   Allergic rhinitis 05/26/2008   OSA on CPAP 05/26/2008    Patient's Medications  New Prescriptions   No medications on file  Previous Medications   ACCU-CHEK GUIDE TEST STRIP       ACETAMINOPHEN (TYLENOL) 650 MG CR TABLET    Take 650 mg by mouth daily as needed for pain.    ALLOPURINOL (ZYLOPRIM) 100 MG TABLET    Take 200 mg by mouth daily.    ASCORBIC ACID (VITAMIN C) 1000 MG TABLET    Take 1,000 mg  by mouth daily.   ASPIRIN EC 81 MG TABLET    Take 81 mg by mouth daily.   ATORVASTATIN (LIPITOR) 40 MG TABLET    Take 40 mg by mouth at bedtime.    BLOOD PRESSURE MONITORING (OMRON 7 SERIES BP MONITOR) DEVI    Chesk bp once a day   DX  I95.1, I10   CALCITRIOL (ROCALTROL) 0.25 MCG CAPSULE    Take 0.25 mcg by mouth daily.   CARVEDILOL (COREG) 3.125 MG TABLET    Take 1 tablet (3.125 mg total) by mouth 2 (two) times daily with a meal.   DOCUSATE SODIUM (DSS) 100 MG CAPS    Take 100 mg by mouth daily.   DOXAZOSIN (CARDURA) 8 MG TABLET    Take 8 mg by mouth at bedtime.   DOXAZOSIN (CARDURA) 8 MG TABLET    Take 0.5 tablets by mouth at bedtime.   DROPLET PEN NEEDLES 31G X 8 MM MISC       FLUTICASONE (FLONASE) 50 MCG/ACT NASAL SPRAY    Place 2 sprays into both nostrils daily as needed for allergies.    FUROSEMIDE (LASIX) 80 MG TABLET    Take 1 tablet (80 mg total) by mouth 2 (two) times daily.   HOMEOPATHIC PRODUCTS (SIMILASAN ALLERGY EYE RELIEF) SOLN    Place 1-2 drops into both eyes daily as needed (for dry eyes).   INSULIN NPH-REGULAR HUMAN (70-30) 100 UNIT/ML INJECTION  Inject 16-18 Units into the skin See admin instructions. Inject 18 units in the morning and 16 units at night   LEVOTHYROXINE (SYNTHROID, LEVOTHROID) 75 MCG TABLET    Take 75 mcg by mouth daily before breakfast.   LORATADINE (CLARITIN) 10 MG TABLET    Take 10 mg by mouth daily as needed for allergies.    MONTELUKAST (SINGULAIR) 10 MG TABLET    Take 10 mg by mouth daily.   MULTIPLE VITAMIN (MULTIVITAMIN WITH MINERALS) TABS TABLET    Take 1 tablet by mouth daily.   PRESCRIPTION MEDICATION    Inhale into the lungs at bedtime. CPAP   TRAMADOL (ULTRAM) 50 MG TABLET    Take 1 tablet (50 mg total) by mouth every 6 (six) hours as needed for moderate pain or severe pain.   VITAMIN D3 (VITAMIN D) 25 MCG TABLET    Take 1,000 Units by mouth daily.  Modified Medications   No medications on file  Discontinued Medications   No medications on  file    HPI: David Nguyen is a 81 y.o. male who was referred to me for evaluation of his chronic left leg pain and recent increased subcutaneous nodules over his left shin.  He has pain in his left knee that shoots down his anterior lower leg.  He says the pain is worse when he is cold, especially when he is on hemodialysis.  The pain is also much worse when he is standing and walking.  He was evaluated in 2020 by Dr. Eduard Roux.  MRI done at that time showed fairly severe tricompartmental osteoarthritis of his left knee.  He received a steroid injection in November 2020 but does not recall getting any significant relief of his pain.  He has not followed up with Dr. Erlinda Hong.  He also underwent evaluation by vascular surgery, Dr. Monica Martinez, last year and there was no evidence of vascular insufficiency causing pain.  He has recently been evaluated for the subcutaneous nodules by one of the providers at [facility] dermatology (he cannot recall a name).  He underwent a biopsy yesterday.  Review of Systems: Review of Systems  Constitutional:  Negative for chills, diaphoresis and fever.  Musculoskeletal:  Positive for joint pain.  Skin:        Firm subcutaneous nodules over the anterior left shin      Past Medical History:  Diagnosis Date   Allergic rhinitis    Anemia    low iron   Arthritis    left leg   Bradycardia 03/2019   Chronic kidney disease    Followed by Dr Jimmy Footman     Diabetes mellitus    Type II   Horseshoe kidney    Hyperlipidemia    Hypertension    Hypothyroidism    Refusal of blood transfusions as patient is Jehovah's Witness    Scrotal edema    Sleep apnea    uses a cpap    Social History   Tobacco Use   Smoking status: Former    Packs/day: 0.30    Years: 3.00    Total pack years: 0.90    Types: Cigarettes    Quit date: 10/09/1968    Years since quitting: 53.5   Smokeless tobacco: Never  Vaping Use   Vaping Use: Never used  Substance Use Topics    Alcohol use: No   Drug use: No    Family History  Problem Relation Age of Onset   Prostate cancer Father  Kidney failure Mother    Lung cancer Sister    Allergies  Allergen Reactions   Penicillins Rash and Other (See Comments)    Childhood allergy       OBJECTIVE: Vitals:   03/30/22 1453  BP: 112/71  Pulse: 71  Temp: 97.9 F (36.6 C)  TempSrc: Oral   There is no height or weight on file to calculate BMI.   Physical Exam Constitutional:      Comments: He is accompanied by his wife.  He is seated in a wheelchair.  Musculoskeletal:        General: No swelling or tenderness.     Right lower leg: No edema.     Left lower leg: Edema present.  Skin:    Comments: He has skin changes of chronic stasis dermatitis of his left lower leg.  1+ nonpitting edema.  He has very firm small subcutaneous nodules scattered over the anterior shin.     Microbiology: No results found for this or any previous visit (from the past 240 hour(s)).  Michel Bickers, MD [facility] [place] [facility] [phone] pager   [phone] cell 03/30/2022, 3:31 PM

## 2022-03-31 DIAGNOSIS — N2581 Secondary hyperparathyroidism of renal origin: Secondary | ICD-10-CM | POA: Diagnosis not present

## 2022-03-31 DIAGNOSIS — Z992 Dependence on renal dialysis: Secondary | ICD-10-CM | POA: Diagnosis not present

## 2022-03-31 DIAGNOSIS — N186 End stage renal disease: Secondary | ICD-10-CM | POA: Diagnosis not present

## 2022-04-03 DIAGNOSIS — N186 End stage renal disease: Secondary | ICD-10-CM | POA: Diagnosis not present

## 2022-04-03 DIAGNOSIS — N2581 Secondary hyperparathyroidism of renal origin: Secondary | ICD-10-CM | POA: Diagnosis not present

## 2022-04-03 DIAGNOSIS — Z992 Dependence on renal dialysis: Secondary | ICD-10-CM | POA: Diagnosis not present

## 2022-04-05 DIAGNOSIS — N186 End stage renal disease: Secondary | ICD-10-CM | POA: Diagnosis not present

## 2022-04-05 DIAGNOSIS — Z992 Dependence on renal dialysis: Secondary | ICD-10-CM | POA: Diagnosis not present

## 2022-04-05 DIAGNOSIS — N2581 Secondary hyperparathyroidism of renal origin: Secondary | ICD-10-CM | POA: Diagnosis not present

## 2022-04-06 DIAGNOSIS — M5416 Radiculopathy, lumbar region: Secondary | ICD-10-CM | POA: Diagnosis not present

## 2022-04-06 DIAGNOSIS — R29898 Other symptoms and signs involving the musculoskeletal system: Secondary | ICD-10-CM | POA: Diagnosis not present

## 2022-04-06 DIAGNOSIS — R269 Unspecified abnormalities of gait and mobility: Secondary | ICD-10-CM | POA: Diagnosis not present

## 2022-04-06 DIAGNOSIS — M1712 Unilateral primary osteoarthritis, left knee: Secondary | ICD-10-CM | POA: Diagnosis not present

## 2022-04-06 DIAGNOSIS — L989 Disorder of the skin and subcutaneous tissue, unspecified: Secondary | ICD-10-CM | POA: Diagnosis not present

## 2022-04-06 DIAGNOSIS — G894 Chronic pain syndrome: Secondary | ICD-10-CM | POA: Diagnosis not present

## 2022-04-06 DIAGNOSIS — M79605 Pain in left leg: Secondary | ICD-10-CM | POA: Diagnosis not present

## 2022-04-06 DIAGNOSIS — Q799 Congenital malformation of musculoskeletal system, unspecified: Secondary | ICD-10-CM | POA: Diagnosis not present

## 2022-04-07 ENCOUNTER — Other Ambulatory Visit: Payer: Self-pay | Admitting: Adult Health

## 2022-04-07 DIAGNOSIS — M5416 Radiculopathy, lumbar region: Secondary | ICD-10-CM

## 2022-04-07 DIAGNOSIS — R29898 Other symptoms and signs involving the musculoskeletal system: Secondary | ICD-10-CM

## 2022-04-07 DIAGNOSIS — R269 Unspecified abnormalities of gait and mobility: Secondary | ICD-10-CM

## 2022-04-07 DIAGNOSIS — N186 End stage renal disease: Secondary | ICD-10-CM | POA: Diagnosis not present

## 2022-04-07 DIAGNOSIS — M79605 Pain in left leg: Secondary | ICD-10-CM

## 2022-04-07 DIAGNOSIS — N2581 Secondary hyperparathyroidism of renal origin: Secondary | ICD-10-CM | POA: Diagnosis not present

## 2022-04-07 DIAGNOSIS — Z992 Dependence on renal dialysis: Secondary | ICD-10-CM | POA: Diagnosis not present

## 2022-04-07 DIAGNOSIS — E1122 Type 2 diabetes mellitus with diabetic chronic kidney disease: Secondary | ICD-10-CM | POA: Diagnosis not present

## 2022-04-10 DIAGNOSIS — N186 End stage renal disease: Secondary | ICD-10-CM | POA: Diagnosis not present

## 2022-04-10 DIAGNOSIS — Z992 Dependence on renal dialysis: Secondary | ICD-10-CM | POA: Diagnosis not present

## 2022-04-10 DIAGNOSIS — N2581 Secondary hyperparathyroidism of renal origin: Secondary | ICD-10-CM | POA: Diagnosis not present

## 2022-04-12 DIAGNOSIS — Z992 Dependence on renal dialysis: Secondary | ICD-10-CM | POA: Diagnosis not present

## 2022-04-12 DIAGNOSIS — N186 End stage renal disease: Secondary | ICD-10-CM | POA: Diagnosis not present

## 2022-04-12 DIAGNOSIS — N2581 Secondary hyperparathyroidism of renal origin: Secondary | ICD-10-CM | POA: Diagnosis not present

## 2022-04-13 DIAGNOSIS — Z4802 Encounter for removal of sutures: Secondary | ICD-10-CM | POA: Diagnosis not present

## 2022-04-14 DIAGNOSIS — N2581 Secondary hyperparathyroidism of renal origin: Secondary | ICD-10-CM | POA: Diagnosis not present

## 2022-04-14 DIAGNOSIS — Z992 Dependence on renal dialysis: Secondary | ICD-10-CM | POA: Diagnosis not present

## 2022-04-14 DIAGNOSIS — N186 End stage renal disease: Secondary | ICD-10-CM | POA: Diagnosis not present

## 2022-04-17 DIAGNOSIS — N2581 Secondary hyperparathyroidism of renal origin: Secondary | ICD-10-CM | POA: Diagnosis not present

## 2022-04-17 DIAGNOSIS — N186 End stage renal disease: Secondary | ICD-10-CM | POA: Diagnosis not present

## 2022-04-17 DIAGNOSIS — Z992 Dependence on renal dialysis: Secondary | ICD-10-CM | POA: Diagnosis not present

## 2022-04-19 DIAGNOSIS — N186 End stage renal disease: Secondary | ICD-10-CM | POA: Diagnosis not present

## 2022-04-19 DIAGNOSIS — N2581 Secondary hyperparathyroidism of renal origin: Secondary | ICD-10-CM | POA: Diagnosis not present

## 2022-04-19 DIAGNOSIS — Z992 Dependence on renal dialysis: Secondary | ICD-10-CM | POA: Diagnosis not present

## 2022-04-20 ENCOUNTER — Ambulatory Visit (INDEPENDENT_AMBULATORY_CARE_PROVIDER_SITE_OTHER): Payer: Medicare HMO | Admitting: Pulmonary Disease

## 2022-04-20 ENCOUNTER — Encounter: Payer: Self-pay | Admitting: Pulmonary Disease

## 2022-04-20 VITALS — BP 118/64 | HR 81 | Temp 97.8°F | Ht 72.0 in | Wt 197.8 lb

## 2022-04-20 DIAGNOSIS — G4733 Obstructive sleep apnea (adult) (pediatric): Secondary | ICD-10-CM | POA: Diagnosis not present

## 2022-04-20 DIAGNOSIS — I5032 Chronic diastolic (congestive) heart failure: Secondary | ICD-10-CM | POA: Diagnosis not present

## 2022-04-20 DIAGNOSIS — Z9989 Dependence on other enabling machines and devices: Secondary | ICD-10-CM | POA: Diagnosis not present

## 2022-04-20 NOTE — Patient Instructions (Signed)
  X CPAP titration study Based on this, we will make adjustments to your machine

## 2022-04-20 NOTE — Progress Notes (Signed)
   Subjective:    Patient ID: David Nguyen, male    DOB: [birth year], 81 y.o.   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   -on CPAP for several years  PMH - ESRD on HD  -Diabetes requiring insulin  1 year follow-up. Arrives in a wheelchair accompanied by his wife.  Unfortunately he has developed ESRD and is now on dialysis M/W/F. He has pain in his left lower extremity and he is unable to walk hence the wheelchair, he has been seen by Ortho and vascular  CPAP is working well, denies any problems with mask or pressure wife has noticed the leak He has lost more than 60 pounds!   Significant tests/ events reviewed  PSG 04/2008 >> severe  OSA  with hypopneas , AHI 63/h, lowest desatn of 78%, longest event 52s. nocturnal cough noted. Improved with CPAP 15 cm   Review of Systems neg for any significant sore throat, dysphagia, itching, sneezing, nasal congestion or excess/ purulent secretions, fever, chills, sweats, unintended wt loss, pleuritic or exertional cp, hempoptysis, orthopnea pnd or change in chronic leg swelling. Also denies presyncope, palpitations, heartburn, abdominal pain, nausea, vomiting, diarrhea or change in bowel or urinary habits, dysuria,hematuria, rash, arthralgias, visual complaints, headache, numbness weakness or ataxia.     Objective:   Physical Exam  Gen. Pleasant, elderly, in wheelchair,obese, in no distress ENT - no lesions, no post nasal drip Neck: No JVD, no thyromegaly, no carotid bruits Lungs: no use of accessory muscles, no dullness to percussion, decreased without rales or rhonchi  Cardiovascular: Rhythm regular, heart sounds  normal, no murmurs or gallops, no peripheral edema Musculoskeletal: No deformities, no cyanosis or clubbing , no tremors       Assessment & Plan:

## 2022-04-21 DIAGNOSIS — N186 End stage renal disease: Secondary | ICD-10-CM | POA: Diagnosis not present

## 2022-04-21 DIAGNOSIS — N2581 Secondary hyperparathyroidism of renal origin: Secondary | ICD-10-CM | POA: Diagnosis not present

## 2022-04-21 DIAGNOSIS — Z992 Dependence on renal dialysis: Secondary | ICD-10-CM | POA: Diagnosis not present

## 2022-04-21 NOTE — Assessment & Plan Note (Signed)
No signs of volume overload today

## 2022-04-21 NOTE — Assessment & Plan Note (Signed)
CPAP download was reviewed which shows residual AHI 47/hour, mostly classified as apneas, central apneas are low but not sure that is a reliable. Given his extensive weight loss of 60 pounds, We will schedule CPAP titration to objectively assess.  It is possible that he may need a lower pressure if these are noted to be central apneas  Weight loss encouraged, compliance with goal of at least 4-6 hrs every night is the expectation. Advised against medications with sedative side effects Cautioned against driving when sleepy - understanding that sleepiness will vary on a day to day basis

## 2022-04-24 DIAGNOSIS — N2581 Secondary hyperparathyroidism of renal origin: Secondary | ICD-10-CM | POA: Diagnosis not present

## 2022-04-24 DIAGNOSIS — Z992 Dependence on renal dialysis: Secondary | ICD-10-CM | POA: Diagnosis not present

## 2022-04-24 DIAGNOSIS — N186 End stage renal disease: Secondary | ICD-10-CM | POA: Diagnosis not present

## 2022-04-26 DIAGNOSIS — N2581 Secondary hyperparathyroidism of renal origin: Secondary | ICD-10-CM | POA: Diagnosis not present

## 2022-04-26 DIAGNOSIS — N186 End stage renal disease: Secondary | ICD-10-CM | POA: Diagnosis not present

## 2022-04-26 DIAGNOSIS — Z992 Dependence on renal dialysis: Secondary | ICD-10-CM | POA: Diagnosis not present

## 2022-04-27 ENCOUNTER — Other Ambulatory Visit: Payer: Self-pay | Admitting: Adult Health

## 2022-04-27 ENCOUNTER — Other Ambulatory Visit (HOSPITAL_COMMUNITY): Payer: Self-pay | Admitting: Adult Health

## 2022-04-27 DIAGNOSIS — R269 Unspecified abnormalities of gait and mobility: Secondary | ICD-10-CM

## 2022-04-27 DIAGNOSIS — M79605 Pain in left leg: Secondary | ICD-10-CM

## 2022-04-27 DIAGNOSIS — M5416 Radiculopathy, lumbar region: Secondary | ICD-10-CM

## 2022-04-27 DIAGNOSIS — R29898 Other symptoms and signs involving the musculoskeletal system: Secondary | ICD-10-CM

## 2022-04-28 DIAGNOSIS — N2581 Secondary hyperparathyroidism of renal origin: Secondary | ICD-10-CM | POA: Diagnosis not present

## 2022-04-28 DIAGNOSIS — N186 End stage renal disease: Secondary | ICD-10-CM | POA: Diagnosis not present

## 2022-04-28 DIAGNOSIS — Z992 Dependence on renal dialysis: Secondary | ICD-10-CM | POA: Diagnosis not present

## 2022-05-01 DIAGNOSIS — N2581 Secondary hyperparathyroidism of renal origin: Secondary | ICD-10-CM | POA: Diagnosis not present

## 2022-05-01 DIAGNOSIS — N186 End stage renal disease: Secondary | ICD-10-CM | POA: Diagnosis not present

## 2022-05-01 DIAGNOSIS — Z992 Dependence on renal dialysis: Secondary | ICD-10-CM | POA: Diagnosis not present

## 2022-05-03 DIAGNOSIS — N186 End stage renal disease: Secondary | ICD-10-CM | POA: Diagnosis not present

## 2022-05-03 DIAGNOSIS — N2581 Secondary hyperparathyroidism of renal origin: Secondary | ICD-10-CM | POA: Diagnosis not present

## 2022-05-03 DIAGNOSIS — Z992 Dependence on renal dialysis: Secondary | ICD-10-CM | POA: Diagnosis not present

## 2022-05-05 DIAGNOSIS — N186 End stage renal disease: Secondary | ICD-10-CM | POA: Diagnosis not present

## 2022-05-05 DIAGNOSIS — Z992 Dependence on renal dialysis: Secondary | ICD-10-CM | POA: Diagnosis not present

## 2022-05-05 DIAGNOSIS — N2581 Secondary hyperparathyroidism of renal origin: Secondary | ICD-10-CM | POA: Diagnosis not present

## 2022-05-08 DIAGNOSIS — E1122 Type 2 diabetes mellitus with diabetic chronic kidney disease: Secondary | ICD-10-CM | POA: Diagnosis not present

## 2022-05-08 DIAGNOSIS — N186 End stage renal disease: Secondary | ICD-10-CM | POA: Diagnosis not present

## 2022-05-08 DIAGNOSIS — I519 Heart disease, unspecified: Secondary | ICD-10-CM | POA: Diagnosis not present

## 2022-05-08 DIAGNOSIS — I130 Hypertensive heart and chronic kidney disease with heart failure and stage 1 through stage 4 chronic kidney disease, or unspecified chronic kidney disease: Secondary | ICD-10-CM | POA: Diagnosis not present

## 2022-05-08 DIAGNOSIS — N2581 Secondary hyperparathyroidism of renal origin: Secondary | ICD-10-CM | POA: Diagnosis not present

## 2022-05-08 DIAGNOSIS — I5032 Chronic diastolic (congestive) heart failure: Secondary | ICD-10-CM | POA: Diagnosis not present

## 2022-05-08 DIAGNOSIS — N184 Chronic kidney disease, stage 4 (severe): Secondary | ICD-10-CM | POA: Diagnosis not present

## 2022-05-08 DIAGNOSIS — Z992 Dependence on renal dialysis: Secondary | ICD-10-CM | POA: Diagnosis not present

## 2022-05-10 DIAGNOSIS — Z992 Dependence on renal dialysis: Secondary | ICD-10-CM | POA: Diagnosis not present

## 2022-05-10 DIAGNOSIS — N186 End stage renal disease: Secondary | ICD-10-CM | POA: Diagnosis not present

## 2022-05-10 DIAGNOSIS — N2581 Secondary hyperparathyroidism of renal origin: Secondary | ICD-10-CM | POA: Diagnosis not present

## 2022-05-12 DIAGNOSIS — N186 End stage renal disease: Secondary | ICD-10-CM | POA: Diagnosis not present

## 2022-05-12 DIAGNOSIS — Z992 Dependence on renal dialysis: Secondary | ICD-10-CM | POA: Diagnosis not present

## 2022-05-12 DIAGNOSIS — N2581 Secondary hyperparathyroidism of renal origin: Secondary | ICD-10-CM | POA: Diagnosis not present

## 2022-05-15 DIAGNOSIS — N2581 Secondary hyperparathyroidism of renal origin: Secondary | ICD-10-CM | POA: Diagnosis not present

## 2022-05-15 DIAGNOSIS — Z992 Dependence on renal dialysis: Secondary | ICD-10-CM | POA: Diagnosis not present

## 2022-05-15 DIAGNOSIS — N186 End stage renal disease: Secondary | ICD-10-CM | POA: Diagnosis not present

## 2022-05-16 ENCOUNTER — Encounter (HOSPITAL_COMMUNITY): Payer: Self-pay

## 2022-05-16 ENCOUNTER — Ambulatory Visit (HOSPITAL_COMMUNITY)
Admission: RE | Admit: 2022-05-16 | Discharge: 2022-05-16 | Disposition: A | Discharge status: Home / Self Care | Payer: Medicare HMO | Source: Ambulatory Visit | Attending: Adult Health | Admitting: Adult Health

## 2022-05-16 ENCOUNTER — Other Ambulatory Visit: Payer: Self-pay

## 2022-05-16 ENCOUNTER — Emergency Department (HOSPITAL_COMMUNITY)
Admission: EM | Admit: 2022-05-16 | Discharge: 2022-05-16 | Disposition: A | Discharge status: Home / Self Care | Payer: Medicare HMO | Attending: Emergency Medicine | Admitting: Emergency Medicine

## 2022-05-16 DIAGNOSIS — Z7982 Long term (current) use of aspirin: Secondary | ICD-10-CM | POA: Diagnosis not present

## 2022-05-16 DIAGNOSIS — M79605 Pain in left leg: Secondary | ICD-10-CM | POA: Diagnosis not present

## 2022-05-16 DIAGNOSIS — M545 Low back pain, unspecified: Secondary | ICD-10-CM | POA: Diagnosis not present

## 2022-05-16 MED ORDER — HYDRALAZINE HCL 25 MG PO TABS: 50.0000 mg | ORAL_TABLET | Freq: Once | ORAL | Status: DC

## 2022-05-16 NOTE — Discharge Instructions (Signed)
I am so sorry for the confusion, however we do not do MRIs here in the emergency department unless there is emergent concern.  Based on your symptoms and your physical exam, I do not think that there is an emergent cause of your lower back pain.  If your doctor would like you to have an MRI, you will need to call her in the morning and have this scheduled outpatient,.   If you develop weakness of the lower legs, numbness or tingling of the groin, or if you find that you lose control of your bladder or bowels, please return to the emergency department immediately.

## 2022-05-16 NOTE — ED Provider Notes (Signed)
[facility] EMERGENCY DEPARTMENT Provider Note   CSN: [id number] Arrival date & time: 05/16/22  2003     History  Chief Complaint  Patient presents with   Back Pain   Leg Pain    L    David Nguyen is a 81 y.o. male who presents to the emergency department stating that he was sent here by his PCP in order to have MRI of the lumbar spine.  Patient is unsure of exactly why he was sent to the emergency department to have this done, stating that Dr. Is "smarter than I am".  He states that he has been having low back pain and left leg pain for a few years.  He is ambulatory with a cane at home.  He denies numbness, tingling, weakness, saddle paresthesia, bladder and bowel dysfunction.   Back Pain Associated symptoms: leg pain   Associated symptoms: no numbness and no weakness   Leg Pain Associated symptoms: back pain        Home Medications Prior to Admission medications   Medication Sig Start Date End Date Taking? Authorizing Provider  ACCU-CHEK GUIDE test strip  12/06/19   [provider]  acetaminophen (TYLENOL) 650 MG CR tablet Take 650 mg by mouth daily as needed for pain.     [provider]  allopurinol (ZYLOPRIM) 100 MG tablet Take 200 mg by mouth daily.  12/07/15   [provider]  Ascorbic Acid (VITAMIN C) 1000 MG tablet Take 1,000 mg by mouth daily.    [provider]  aspirin EC 81 MG tablet Take 81 mg by mouth daily.    [provider]  atorvastatin (LIPITOR) 40 MG tablet Take 40 mg by mouth at bedtime.     [provider]  Blood Pressure Monitoring (OMRON 7 SERIES BP MONITOR) DEVI Chesk bp once a day   DX  I95.1, I10 10/28/15   [provider]  calcitRIOL (ROCALTROL) 0.25 MCG capsule Take 0.25 mcg by mouth daily. 12/07/15   [provider]  carvedilol (COREG) 3.125 MG tablet Take 1 tablet (3.125 mg total) by mouth 2 (two) times daily with a meal. 04/05/19   Allie Bossier, MD   Docusate Sodium (DSS) 100 MG CAPS Take 100 mg by mouth daily. 05/04/21   [provider]  DROPLET PEN NEEDLES 31G X 8 MM [place]  11/18/19   [provider]  fluticasone (FLONASE) 50 MCG/ACT nasal spray Place 2 sprays into both nostrils daily as needed for allergies. 03/31/18   [provider]  Homeopathic Products [facility] ALLERGY EYE RELIEF) SOLN Place 1-2 drops into both eyes daily as needed (for dry eyes).    [provider]  insulin NPH-regular Human (70-30) 100 UNIT/ML injection Inject 16-18 Units into the skin See admin instructions. Inject 18 units in the morning and 16 units at night    [provider]  levothyroxine (SYNTHROID, LEVOTHROID) 75 MCG tablet Take 75 mcg by mouth daily before breakfast.    [provider]  loratadine (CLARITIN) 10 MG tablet Take 10 mg by mouth daily as needed for allergies.    [provider]  montelukast (SINGULAIR) 10 MG tablet Take 10 mg by mouth daily. 05/21/20   [provider]  Multiple Vitamin (MULTIVITAMIN WITH MINERALS) TABS tablet Take 1 tablet by mouth daily.    [provider]  PRESCRIPTION MEDICATION Inhale into the lungs at bedtime. CPAP    [provider]  Vitamin D3 (VITAMIN  D) 25 MCG tablet Take 1,000 Units by mouth daily.    [provider]      Allergies    Penicillins    Review of Systems   Review of Systems  Gastrointestinal:  Negative for diarrhea.  Genitourinary:  Negative for difficulty urinating.  Musculoskeletal:  Positive for back pain.  Neurological:  Negative for weakness and numbness.    Physical Exam Updated Vital Signs BP 106/61   Pulse (!) 101   Temp 97.8 F (36.6 C) (Oral)   Resp 15   SpO2 99%  Physical Exam Vitals and nursing note reviewed.  Constitutional:      General: He is not in acute distress.    Appearance: He is not ill-appearing.  HENT:     Head: Atraumatic.  Eyes:     Conjunctiva/sclera: Conjunctivae  normal.  Cardiovascular:     Rate and Rhythm: Normal rate and regular rhythm.     Pulses: Normal pulses.     Heart sounds: No murmur heard. Pulmonary:     Effort: Pulmonary effort is normal. No respiratory distress.     Breath sounds: Normal breath sounds.  Abdominal:     General: Abdomen is flat. There is no distension.     Palpations: Abdomen is soft.     Tenderness: There is no abdominal tenderness.  Musculoskeletal:        General: Normal range of motion.     Cervical back: Normal range of motion.     Comments: There are some midline lumbar tenderness to palpation.  Skin:    General: Skin is warm and dry.     Capillary Refill: Capillary refill takes less than 2 seconds.  Neurological:     General: No focal deficit present.     Mental Status: He is alert.     Sensory: Sensation is intact.     Motor: No weakness.     Deep Tendon Reflexes:     Reflex Scores:      Patellar reflexes are 2+ on the right side and 2+ on the left side.    Comments: Subjective sensation of the lower extremities intact.  He has strong straight leg raise of legs against resistance.  Full motor function.  Psychiatric:        Mood and Affect: Mood normal.     ED Results / Procedures / Treatments   Labs (all labs ordered are listed, but only abnormal results are displayed) Labs Reviewed - No data to display  EKG None  Radiology No results found.  Procedures Procedures    Medications Ordered in ED Medications - No data to display  ED Course/ Medical Decision Making/ A&P                           Medical Decision Making  Mr. Chevalier is 81 years old and evaluated in triage stating that he was sent here for MRI of the lumbar spine by his PCP.  Vitals are without significant abnormality.  On exam, there are no red flag symptoms including no weakness, no sensation deficits.  Patellar reflexes are intact.  He is at his baseline ambulation with a cane.  He has no new traumas or injuries.  No new  symptoms apart from the chronic lower abdominal pain and left leg pain that has been ongoing for quite some time.  Unfortunately, I do not see a note from PCP regarding why they sent him here to the emergency  department.  I do see there is an outpatient MRI ordered and he was scheduled for this tonight at [facility] at 9 PM.   Ultimately, I discussed with patient that unfortunately given the symptoms that he is complaining of and my exam findings, an MRI here in the emergency department is not clinically indicated.  We discussed return precautions and red flag symptoms and when to return to the emergency department, however I advised him to contact his PCP in the morning to discuss having outpatient MRI scheduled.  Patient expresses understanding and is amenable to plan.   Discussed case with Dr. Kathrynn Humble who agrees with management. Final Clinical Impression(s) / ED Diagnoses Final diagnoses:  Left leg pain    Rx / DC Orders ED Discharge Orders     None         Rodena Piety 05/16/22 2126    Varney Biles, MD 05/16/22 [phone]

## 2022-05-16 NOTE — ED Triage Notes (Signed)
Pt c/o lumbar back pain, L leg pain x "a few years," advised by PCP to come to ED for MRI, unsure as to why, states "he's smarter than I am." Ambulatory w cane x "a couple mos."

## 2022-05-17 DIAGNOSIS — N186 End stage renal disease: Secondary | ICD-10-CM | POA: Diagnosis not present

## 2022-05-17 DIAGNOSIS — Z992 Dependence on renal dialysis: Secondary | ICD-10-CM | POA: Diagnosis not present

## 2022-05-17 DIAGNOSIS — N2581 Secondary hyperparathyroidism of renal origin: Secondary | ICD-10-CM | POA: Diagnosis not present

## 2022-05-19 DIAGNOSIS — M5416 Radiculopathy, lumbar region: Secondary | ICD-10-CM | POA: Diagnosis not present

## 2022-05-19 DIAGNOSIS — N186 End stage renal disease: Secondary | ICD-10-CM | POA: Diagnosis not present

## 2022-05-19 DIAGNOSIS — Z992 Dependence on renal dialysis: Secondary | ICD-10-CM | POA: Diagnosis not present

## 2022-05-19 DIAGNOSIS — M1712 Unilateral primary osteoarthritis, left knee: Secondary | ICD-10-CM | POA: Diagnosis not present

## 2022-05-19 DIAGNOSIS — N2581 Secondary hyperparathyroidism of renal origin: Secondary | ICD-10-CM | POA: Diagnosis not present

## 2022-05-19 DIAGNOSIS — R269 Unspecified abnormalities of gait and mobility: Secondary | ICD-10-CM | POA: Diagnosis not present

## 2022-05-19 DIAGNOSIS — R29898 Other symptoms and signs involving the musculoskeletal system: Secondary | ICD-10-CM | POA: Diagnosis not present

## 2022-05-21 ENCOUNTER — Ambulatory Visit (HOSPITAL_COMMUNITY)
Admission: RE | Admit: 2022-05-21 | Discharge: 2022-05-21 | Disposition: A | Discharge status: Home / Self Care | Payer: Medicare HMO | Source: Ambulatory Visit | Attending: Adult Health | Admitting: Adult Health

## 2022-05-21 DIAGNOSIS — R29898 Other symptoms and signs involving the musculoskeletal system: Secondary | ICD-10-CM | POA: Insufficient documentation

## 2022-05-21 DIAGNOSIS — M79605 Pain in left leg: Secondary | ICD-10-CM | POA: Insufficient documentation

## 2022-05-21 DIAGNOSIS — M5416 Radiculopathy, lumbar region: Secondary | ICD-10-CM | POA: Diagnosis not present

## 2022-05-21 DIAGNOSIS — R269 Unspecified abnormalities of gait and mobility: Secondary | ICD-10-CM | POA: Insufficient documentation

## 2022-05-21 DIAGNOSIS — M545 Low back pain, unspecified: Secondary | ICD-10-CM | POA: Diagnosis not present

## 2022-05-22 DIAGNOSIS — N2581 Secondary hyperparathyroidism of renal origin: Secondary | ICD-10-CM | POA: Diagnosis not present

## 2022-05-22 DIAGNOSIS — Z992 Dependence on renal dialysis: Secondary | ICD-10-CM | POA: Diagnosis not present

## 2022-05-22 DIAGNOSIS — N186 End stage renal disease: Secondary | ICD-10-CM | POA: Diagnosis not present

## 2022-05-24 DIAGNOSIS — N186 End stage renal disease: Secondary | ICD-10-CM | POA: Diagnosis not present

## 2022-05-24 DIAGNOSIS — Z992 Dependence on renal dialysis: Secondary | ICD-10-CM | POA: Diagnosis not present

## 2022-05-24 DIAGNOSIS — N2581 Secondary hyperparathyroidism of renal origin: Secondary | ICD-10-CM | POA: Diagnosis not present

## 2022-05-25 DIAGNOSIS — N186 End stage renal disease: Secondary | ICD-10-CM | POA: Diagnosis not present

## 2022-05-25 DIAGNOSIS — I130 Hypertensive heart and chronic kidney disease with heart failure and stage 1 through stage 4 chronic kidney disease, or unspecified chronic kidney disease: Secondary | ICD-10-CM | POA: Diagnosis not present

## 2022-05-25 DIAGNOSIS — I5032 Chronic diastolic (congestive) heart failure: Secondary | ICD-10-CM | POA: Diagnosis not present

## 2022-05-25 DIAGNOSIS — M1712 Unilateral primary osteoarthritis, left knee: Secondary | ICD-10-CM | POA: Diagnosis not present

## 2022-05-25 DIAGNOSIS — G894 Chronic pain syndrome: Secondary | ICD-10-CM | POA: Diagnosis not present

## 2022-05-25 DIAGNOSIS — M5416 Radiculopathy, lumbar region: Secondary | ICD-10-CM | POA: Diagnosis not present

## 2022-05-25 DIAGNOSIS — E1129 Type 2 diabetes mellitus with other diabetic kidney complication: Secondary | ICD-10-CM | POA: Diagnosis not present

## 2022-05-25 DIAGNOSIS — R269 Unspecified abnormalities of gait and mobility: Secondary | ICD-10-CM | POA: Diagnosis not present

## 2022-05-26 DIAGNOSIS — N2581 Secondary hyperparathyroidism of renal origin: Secondary | ICD-10-CM | POA: Diagnosis not present

## 2022-05-26 DIAGNOSIS — N186 End stage renal disease: Secondary | ICD-10-CM | POA: Diagnosis not present

## 2022-05-26 DIAGNOSIS — Z992 Dependence on renal dialysis: Secondary | ICD-10-CM | POA: Diagnosis not present

## 2022-05-29 DIAGNOSIS — Z992 Dependence on renal dialysis: Secondary | ICD-10-CM | POA: Diagnosis not present

## 2022-05-29 DIAGNOSIS — N186 End stage renal disease: Secondary | ICD-10-CM | POA: Diagnosis not present

## 2022-05-29 DIAGNOSIS — N2581 Secondary hyperparathyroidism of renal origin: Secondary | ICD-10-CM | POA: Diagnosis not present

## 2022-05-31 DIAGNOSIS — N186 End stage renal disease: Secondary | ICD-10-CM | POA: Diagnosis not present

## 2022-05-31 DIAGNOSIS — Z992 Dependence on renal dialysis: Secondary | ICD-10-CM | POA: Diagnosis not present

## 2022-05-31 DIAGNOSIS — N2581 Secondary hyperparathyroidism of renal origin: Secondary | ICD-10-CM | POA: Diagnosis not present

## 2022-06-02 DIAGNOSIS — Z992 Dependence on renal dialysis: Secondary | ICD-10-CM | POA: Diagnosis not present

## 2022-06-02 DIAGNOSIS — N2581 Secondary hyperparathyroidism of renal origin: Secondary | ICD-10-CM | POA: Diagnosis not present

## 2022-06-02 DIAGNOSIS — N186 End stage renal disease: Secondary | ICD-10-CM | POA: Diagnosis not present

## 2022-06-05 DIAGNOSIS — N2581 Secondary hyperparathyroidism of renal origin: Secondary | ICD-10-CM | POA: Diagnosis not present

## 2022-06-05 DIAGNOSIS — Z992 Dependence on renal dialysis: Secondary | ICD-10-CM | POA: Diagnosis not present

## 2022-06-05 DIAGNOSIS — N186 End stage renal disease: Secondary | ICD-10-CM | POA: Diagnosis not present

## 2022-06-07 DIAGNOSIS — Z992 Dependence on renal dialysis: Secondary | ICD-10-CM | POA: Diagnosis not present

## 2022-06-07 DIAGNOSIS — N2581 Secondary hyperparathyroidism of renal origin: Secondary | ICD-10-CM | POA: Diagnosis not present

## 2022-06-07 DIAGNOSIS — N186 End stage renal disease: Secondary | ICD-10-CM | POA: Diagnosis not present

## 2022-06-08 DIAGNOSIS — N186 End stage renal disease: Secondary | ICD-10-CM | POA: Diagnosis not present

## 2022-06-08 DIAGNOSIS — Z992 Dependence on renal dialysis: Secondary | ICD-10-CM | POA: Diagnosis not present

## 2022-06-08 DIAGNOSIS — E1122 Type 2 diabetes mellitus with diabetic chronic kidney disease: Secondary | ICD-10-CM | POA: Diagnosis not present

## 2022-06-09 DIAGNOSIS — G4733 Obstructive sleep apnea (adult) (pediatric): Secondary | ICD-10-CM | POA: Diagnosis not present

## 2022-06-09 DIAGNOSIS — N2581 Secondary hyperparathyroidism of renal origin: Secondary | ICD-10-CM | POA: Diagnosis not present

## 2022-06-09 DIAGNOSIS — N186 End stage renal disease: Secondary | ICD-10-CM | POA: Diagnosis not present

## 2022-06-09 DIAGNOSIS — Z992 Dependence on renal dialysis: Secondary | ICD-10-CM | POA: Diagnosis not present

## 2022-06-12 DIAGNOSIS — N186 End stage renal disease: Secondary | ICD-10-CM | POA: Diagnosis not present

## 2022-06-12 DIAGNOSIS — N2581 Secondary hyperparathyroidism of renal origin: Secondary | ICD-10-CM | POA: Diagnosis not present

## 2022-06-12 DIAGNOSIS — Z992 Dependence on renal dialysis: Secondary | ICD-10-CM | POA: Diagnosis not present

## 2022-06-14 ENCOUNTER — Ambulatory Visit (HOSPITAL_BASED_OUTPATIENT_CLINIC_OR_DEPARTMENT_OTHER): Payer: Medicare HMO | Attending: Pulmonary Disease | Admitting: Pulmonary Disease

## 2022-06-14 DIAGNOSIS — Z9989 Dependence on other enabling machines and devices: Secondary | ICD-10-CM

## 2022-06-14 DIAGNOSIS — G4731 Primary central sleep apnea: Secondary | ICD-10-CM | POA: Diagnosis not present

## 2022-06-14 DIAGNOSIS — N186 End stage renal disease: Secondary | ICD-10-CM | POA: Diagnosis not present

## 2022-06-14 DIAGNOSIS — G4733 Obstructive sleep apnea (adult) (pediatric): Secondary | ICD-10-CM | POA: Diagnosis not present

## 2022-06-14 DIAGNOSIS — N2581 Secondary hyperparathyroidism of renal origin: Secondary | ICD-10-CM | POA: Diagnosis not present

## 2022-06-14 DIAGNOSIS — Z992 Dependence on renal dialysis: Secondary | ICD-10-CM | POA: Diagnosis not present

## 2022-06-16 DIAGNOSIS — N186 End stage renal disease: Secondary | ICD-10-CM | POA: Diagnosis not present

## 2022-06-16 DIAGNOSIS — Z992 Dependence on renal dialysis: Secondary | ICD-10-CM | POA: Diagnosis not present

## 2022-06-16 DIAGNOSIS — N2581 Secondary hyperparathyroidism of renal origin: Secondary | ICD-10-CM | POA: Diagnosis not present

## 2022-06-17 DIAGNOSIS — E119 Type 2 diabetes mellitus without complications: Secondary | ICD-10-CM | POA: Diagnosis not present

## 2022-06-19 DIAGNOSIS — M1712 Unilateral primary osteoarthritis, left knee: Secondary | ICD-10-CM | POA: Diagnosis not present

## 2022-06-19 DIAGNOSIS — R269 Unspecified abnormalities of gait and mobility: Secondary | ICD-10-CM | POA: Diagnosis not present

## 2022-06-19 DIAGNOSIS — Z992 Dependence on renal dialysis: Secondary | ICD-10-CM | POA: Diagnosis not present

## 2022-06-19 DIAGNOSIS — M5416 Radiculopathy, lumbar region: Secondary | ICD-10-CM | POA: Diagnosis not present

## 2022-06-19 DIAGNOSIS — N2581 Secondary hyperparathyroidism of renal origin: Secondary | ICD-10-CM | POA: Diagnosis not present

## 2022-06-19 DIAGNOSIS — N186 End stage renal disease: Secondary | ICD-10-CM | POA: Diagnosis not present

## 2022-06-19 DIAGNOSIS — R29898 Other symptoms and signs involving the musculoskeletal system: Secondary | ICD-10-CM | POA: Diagnosis not present

## 2022-06-21 DIAGNOSIS — Z992 Dependence on renal dialysis: Secondary | ICD-10-CM | POA: Diagnosis not present

## 2022-06-21 DIAGNOSIS — N2581 Secondary hyperparathyroidism of renal origin: Secondary | ICD-10-CM | POA: Diagnosis not present

## 2022-06-21 DIAGNOSIS — N186 End stage renal disease: Secondary | ICD-10-CM | POA: Diagnosis not present

## 2022-06-22 DIAGNOSIS — I871 Compression of vein: Secondary | ICD-10-CM | POA: Diagnosis not present

## 2022-06-22 DIAGNOSIS — T82858A Stenosis of vascular prosthetic devices, implants and grafts, initial encounter: Secondary | ICD-10-CM | POA: Diagnosis not present

## 2022-06-22 DIAGNOSIS — Z992 Dependence on renal dialysis: Secondary | ICD-10-CM | POA: Diagnosis not present

## 2022-06-22 DIAGNOSIS — N186 End stage renal disease: Secondary | ICD-10-CM | POA: Diagnosis not present

## 2022-06-23 DIAGNOSIS — N2581 Secondary hyperparathyroidism of renal origin: Secondary | ICD-10-CM | POA: Diagnosis not present

## 2022-06-23 DIAGNOSIS — N186 End stage renal disease: Secondary | ICD-10-CM | POA: Diagnosis not present

## 2022-06-23 DIAGNOSIS — Z992 Dependence on renal dialysis: Secondary | ICD-10-CM | POA: Diagnosis not present

## 2022-06-23 NOTE — Procedures (Signed)
Patient Name: David Nguyen, [place] Date: 06/14/2022 Gender: Male D.O.B: Mar 21, 1941 Age (years): 63 Referring Provider: Kara Mead MD, ABSM Height (inches): 72 Interpreting Physician: Kara Mead MD, ABSM Weight (lbs): 195 RPSGT: Zadie Rhine BMI: 26 MRN: [id number] Neck Size: 16.50 <br> <br> CLINICAL INFORMATION The patient is referred for a PAP titration to treat sleep apnea.    NPSG 04/2008 >> severe OSA with hypopneas , AHI 63/h, lowest desatn of 78%, longest event 52s. Improved with CPAP 15 cm Persistent events on more recent CPAP downloads  SLEEP STUDY TECHNIQUE As per the AASM Manual for the Scoring of Sleep and Associated Events v2.3 (April 2016) with a hypopnea requiring 4% desaturations.  The channels recorded and monitored were frontal, central and occipital EEG, electrooculogram (EOG), submentalis EMG (chin), nasal and oral airflow, thoracic and abdominal wall motion, anterior tibialis EMG, snore microphone, electrocardiogram, and pulse oximetry. Bilevel positive airway pressure (BPAP) was initiated at the beginning of the study and titrated to treat sleep-disordered breathing.  MEDICATIONS Medications self-administered by patient taken the night of the study : N/A  RESPIRATORY PARAMETERS Optimal IPAP Pressure (cm): 22 AHI at Optimal Pressure (/hr) 0 Optimal EPAP Pressure (cm): 18   Overall Minimal O2 (%): 88.00 Minimal O2 at Optimal Pressure (%): 96.0 SLEEP ARCHITECTURE Start Time: 9:22:16 PM Stop Time: 4:56:41 AM Total Time (min): 454.4 Total Sleep Time (min): 370.3 Sleep Latency (min): 5.6 Sleep Efficiency (%): 81.5 REM Latency (min): 148.0 WASO (min): 78.5 Stage N1 (%): 13.77 Stage N2 (%): 68.95 Stage N3 (%): 0.00 Stage R (%): 17.3 Supine (%): 87.17 Arousal Index (/hr): 51.7     CARDIAC DATA The 2 lead EKG demonstrated sinus rhythm. The mean heart rate was 67.73 beats per minute. Other EKG findings include: None.   LEG MOVEMENT DATA The total Periodic Limb  Movements of Sleep (PLMS) were 248. The PLMS index was 40.18. A PLMS index of <15 is considered normal in adults.  IMPRESSIONS - An optimal BiPAP pressure was selected for this patient ( 22 / 18cm of water). Eevents persisted on CPAP upto 13 cm & he could not tolerate higher pressures. Hence switched to BiPAP - Mild Central Sleep Apnea was noted during this titration (CAI = 10/h). - Mild oxygen desaturations were observed during this titration (min O2 = 88.00%). - No snoring was audible during this study. - No cardiac abnormalities were observed during this study. - Moderate periodic limb movements were observed during this study. Arousals associated with PLMs were rare.   DIAGNOSIS - Obstructive Sleep Apnea (G47.33) - Treatment emergent central sleep apnea   RECOMMENDATIONS - Trial of BiPAP therapy on 22/18 cm H2O with a Large size Fisher&Paykel Full Face Simplus mask and heated humidification. - Avoid alcohol, sedatives and other CNS depressants that may worsen sleep apnea and disrupt normal sleep architecture. - Sleep hygiene should be reviewed to assess factors that may improve sleep quality. - Weight management and regular exercise should be initiated or continued. - Return to Sleep Center for re-evaluation after 4 weeks of therapy   Kara Mead MD Board Certified in [place]

## 2022-06-26 DIAGNOSIS — Z992 Dependence on renal dialysis: Secondary | ICD-10-CM | POA: Diagnosis not present

## 2022-06-26 DIAGNOSIS — N2581 Secondary hyperparathyroidism of renal origin: Secondary | ICD-10-CM | POA: Diagnosis not present

## 2022-06-26 DIAGNOSIS — N186 End stage renal disease: Secondary | ICD-10-CM | POA: Diagnosis not present

## 2022-06-27 ENCOUNTER — Encounter: Payer: Self-pay | Admitting: Podiatry

## 2022-06-27 ENCOUNTER — Ambulatory Visit (INDEPENDENT_AMBULATORY_CARE_PROVIDER_SITE_OTHER): Payer: Medicare HMO | Admitting: Podiatry

## 2022-06-27 DIAGNOSIS — I878 Other specified disorders of veins: Secondary | ICD-10-CM

## 2022-06-27 DIAGNOSIS — E118 Type 2 diabetes mellitus with unspecified complications: Secondary | ICD-10-CM

## 2022-06-27 DIAGNOSIS — E1122 Type 2 diabetes mellitus with diabetic chronic kidney disease: Secondary | ICD-10-CM

## 2022-06-27 DIAGNOSIS — M79674 Pain in right toe(s): Secondary | ICD-10-CM | POA: Diagnosis not present

## 2022-06-27 DIAGNOSIS — N184 Chronic kidney disease, stage 4 (severe): Secondary | ICD-10-CM

## 2022-06-27 DIAGNOSIS — I5032 Chronic diastolic (congestive) heart failure: Secondary | ICD-10-CM

## 2022-06-27 DIAGNOSIS — B351 Tinea unguium: Secondary | ICD-10-CM | POA: Diagnosis not present

## 2022-06-27 DIAGNOSIS — Z9989 Dependence on other enabling machines and devices: Secondary | ICD-10-CM

## 2022-06-27 DIAGNOSIS — M79675 Pain in left toe(s): Secondary | ICD-10-CM

## 2022-06-27 NOTE — Progress Notes (Signed)
This patient returns to my office for at risk foot care.  This patient requires this care by a professional since this patient will be at risk due to having CKD, and type 2 diabetes  This patient is unable to cut nails himself since the patient cannot reach his nails.These nails are painful walking and wearing shoes.  This patient presents for at risk foot care today.  General Appearance  Alert, conversant and in no acute stress.  Vascular  Dorsalis pedis and posterior tibial  pulses are weakly palpable  bilaterally.  Capillary return is within normal limits  bilaterally. Temperature is within normal limits  bilaterally.  Neurologic  Senn-Weinstein monofilament wire test within normal limits  bilaterally. Muscle power within normal limits bilaterally.  Nails Thick disfigured discolored nails with subungual debris  from hallux to fifth toes bilaterally. No evidence of bacterial infection or drainage bilaterally.  Orthopedic  No limitations of motion  feet .  No crepitus or effusions noted.  No bony pathology or digital deformities noted. HAV  B/L.  Skin  normotropic skin with no porokeratosis noted bilaterally.  No signs of infections or ulcers noted.     Onychomycosis  Pain in right toes  Pain in left toes  Consent was obtained for treatment procedures.   Mechanical debridement of nails 1-5  bilaterally performed with a nail nipper.  Filed with dremel without incident.    Return office visit    3 months                 Told patient to return for periodic foot care and evaluation due to potential at risk complications.   Xiong Haidar DPM  

## 2022-06-28 DIAGNOSIS — N2581 Secondary hyperparathyroidism of renal origin: Secondary | ICD-10-CM | POA: Diagnosis not present

## 2022-06-28 DIAGNOSIS — N186 End stage renal disease: Secondary | ICD-10-CM | POA: Diagnosis not present

## 2022-06-28 DIAGNOSIS — Z992 Dependence on renal dialysis: Secondary | ICD-10-CM | POA: Diagnosis not present

## 2022-06-30 DIAGNOSIS — N186 End stage renal disease: Secondary | ICD-10-CM | POA: Diagnosis not present

## 2022-06-30 DIAGNOSIS — N2581 Secondary hyperparathyroidism of renal origin: Secondary | ICD-10-CM | POA: Diagnosis not present

## 2022-06-30 DIAGNOSIS — Z992 Dependence on renal dialysis: Secondary | ICD-10-CM | POA: Diagnosis not present

## 2022-07-03 DIAGNOSIS — Z992 Dependence on renal dialysis: Secondary | ICD-10-CM | POA: Diagnosis not present

## 2022-07-03 DIAGNOSIS — N186 End stage renal disease: Secondary | ICD-10-CM | POA: Diagnosis not present

## 2022-07-03 DIAGNOSIS — N2581 Secondary hyperparathyroidism of renal origin: Secondary | ICD-10-CM | POA: Diagnosis not present

## 2022-07-05 DIAGNOSIS — Z992 Dependence on renal dialysis: Secondary | ICD-10-CM | POA: Diagnosis not present

## 2022-07-05 DIAGNOSIS — N186 End stage renal disease: Secondary | ICD-10-CM | POA: Diagnosis not present

## 2022-07-05 DIAGNOSIS — N2581 Secondary hyperparathyroidism of renal origin: Secondary | ICD-10-CM | POA: Diagnosis not present

## 2022-07-07 DIAGNOSIS — N2581 Secondary hyperparathyroidism of renal origin: Secondary | ICD-10-CM | POA: Diagnosis not present

## 2022-07-07 DIAGNOSIS — Z992 Dependence on renal dialysis: Secondary | ICD-10-CM | POA: Diagnosis not present

## 2022-07-07 DIAGNOSIS — N186 End stage renal disease: Secondary | ICD-10-CM | POA: Diagnosis not present

## 2022-07-08 DIAGNOSIS — Z992 Dependence on renal dialysis: Secondary | ICD-10-CM | POA: Diagnosis not present

## 2022-07-08 DIAGNOSIS — E1122 Type 2 diabetes mellitus with diabetic chronic kidney disease: Secondary | ICD-10-CM | POA: Diagnosis not present

## 2022-07-08 DIAGNOSIS — N186 End stage renal disease: Secondary | ICD-10-CM | POA: Diagnosis not present

## 2022-07-10 DIAGNOSIS — N186 End stage renal disease: Secondary | ICD-10-CM | POA: Diagnosis not present

## 2022-07-10 DIAGNOSIS — Z992 Dependence on renal dialysis: Secondary | ICD-10-CM | POA: Diagnosis not present

## 2022-07-10 DIAGNOSIS — N2581 Secondary hyperparathyroidism of renal origin: Secondary | ICD-10-CM | POA: Diagnosis not present

## 2022-07-12 DIAGNOSIS — N2581 Secondary hyperparathyroidism of renal origin: Secondary | ICD-10-CM | POA: Diagnosis not present

## 2022-07-12 DIAGNOSIS — Z992 Dependence on renal dialysis: Secondary | ICD-10-CM | POA: Diagnosis not present

## 2022-07-12 DIAGNOSIS — N186 End stage renal disease: Secondary | ICD-10-CM | POA: Diagnosis not present

## 2022-07-14 DIAGNOSIS — N186 End stage renal disease: Secondary | ICD-10-CM | POA: Diagnosis not present

## 2022-07-14 DIAGNOSIS — N2581 Secondary hyperparathyroidism of renal origin: Secondary | ICD-10-CM | POA: Diagnosis not present

## 2022-07-14 DIAGNOSIS — Z992 Dependence on renal dialysis: Secondary | ICD-10-CM | POA: Diagnosis not present

## 2022-07-17 DIAGNOSIS — N186 End stage renal disease: Secondary | ICD-10-CM | POA: Diagnosis not present

## 2022-07-17 DIAGNOSIS — N2581 Secondary hyperparathyroidism of renal origin: Secondary | ICD-10-CM | POA: Diagnosis not present

## 2022-07-17 DIAGNOSIS — Z992 Dependence on renal dialysis: Secondary | ICD-10-CM | POA: Diagnosis not present

## 2022-07-19 DIAGNOSIS — Z992 Dependence on renal dialysis: Secondary | ICD-10-CM | POA: Diagnosis not present

## 2022-07-19 DIAGNOSIS — N2581 Secondary hyperparathyroidism of renal origin: Secondary | ICD-10-CM | POA: Diagnosis not present

## 2022-07-19 DIAGNOSIS — N186 End stage renal disease: Secondary | ICD-10-CM | POA: Diagnosis not present

## 2022-07-20 DIAGNOSIS — M5416 Radiculopathy, lumbar region: Secondary | ICD-10-CM | POA: Diagnosis not present

## 2022-07-20 DIAGNOSIS — M48062 Spinal stenosis, lumbar region with neurogenic claudication: Secondary | ICD-10-CM | POA: Diagnosis not present

## 2022-07-21 DIAGNOSIS — N2581 Secondary hyperparathyroidism of renal origin: Secondary | ICD-10-CM | POA: Diagnosis not present

## 2022-07-21 DIAGNOSIS — N186 End stage renal disease: Secondary | ICD-10-CM | POA: Diagnosis not present

## 2022-07-21 DIAGNOSIS — Z992 Dependence on renal dialysis: Secondary | ICD-10-CM | POA: Diagnosis not present

## 2022-07-24 DIAGNOSIS — N2581 Secondary hyperparathyroidism of renal origin: Secondary | ICD-10-CM | POA: Diagnosis not present

## 2022-07-24 DIAGNOSIS — N186 End stage renal disease: Secondary | ICD-10-CM | POA: Diagnosis not present

## 2022-07-24 DIAGNOSIS — Z992 Dependence on renal dialysis: Secondary | ICD-10-CM | POA: Diagnosis not present

## 2022-07-26 DIAGNOSIS — Z992 Dependence on renal dialysis: Secondary | ICD-10-CM | POA: Diagnosis not present

## 2022-07-26 DIAGNOSIS — N186 End stage renal disease: Secondary | ICD-10-CM | POA: Diagnosis not present

## 2022-07-26 DIAGNOSIS — N2581 Secondary hyperparathyroidism of renal origin: Secondary | ICD-10-CM | POA: Diagnosis not present

## 2022-07-28 DIAGNOSIS — N186 End stage renal disease: Secondary | ICD-10-CM | POA: Diagnosis not present

## 2022-07-28 DIAGNOSIS — N2581 Secondary hyperparathyroidism of renal origin: Secondary | ICD-10-CM | POA: Diagnosis not present

## 2022-07-28 DIAGNOSIS — Z992 Dependence on renal dialysis: Secondary | ICD-10-CM | POA: Diagnosis not present

## 2022-07-31 DIAGNOSIS — Z992 Dependence on renal dialysis: Secondary | ICD-10-CM | POA: Diagnosis not present

## 2022-07-31 DIAGNOSIS — N2581 Secondary hyperparathyroidism of renal origin: Secondary | ICD-10-CM | POA: Diagnosis not present

## 2022-07-31 DIAGNOSIS — N186 End stage renal disease: Secondary | ICD-10-CM | POA: Diagnosis not present

## 2022-08-02 DIAGNOSIS — N2581 Secondary hyperparathyroidism of renal origin: Secondary | ICD-10-CM | POA: Diagnosis not present

## 2022-08-02 DIAGNOSIS — N186 End stage renal disease: Secondary | ICD-10-CM | POA: Diagnosis not present

## 2022-08-02 DIAGNOSIS — Z992 Dependence on renal dialysis: Secondary | ICD-10-CM | POA: Diagnosis not present

## 2022-08-04 DIAGNOSIS — N186 End stage renal disease: Secondary | ICD-10-CM | POA: Diagnosis not present

## 2022-08-04 DIAGNOSIS — N2581 Secondary hyperparathyroidism of renal origin: Secondary | ICD-10-CM | POA: Diagnosis not present

## 2022-08-04 DIAGNOSIS — Z992 Dependence on renal dialysis: Secondary | ICD-10-CM | POA: Diagnosis not present

## 2022-08-07 DIAGNOSIS — Z992 Dependence on renal dialysis: Secondary | ICD-10-CM | POA: Diagnosis not present

## 2022-08-07 DIAGNOSIS — N2581 Secondary hyperparathyroidism of renal origin: Secondary | ICD-10-CM | POA: Diagnosis not present

## 2022-08-07 DIAGNOSIS — N186 End stage renal disease: Secondary | ICD-10-CM | POA: Diagnosis not present

## 2022-08-08 DIAGNOSIS — M5416 Radiculopathy, lumbar region: Secondary | ICD-10-CM | POA: Diagnosis not present

## 2022-08-08 DIAGNOSIS — Z992 Dependence on renal dialysis: Secondary | ICD-10-CM | POA: Diagnosis not present

## 2022-08-08 DIAGNOSIS — N186 End stage renal disease: Secondary | ICD-10-CM | POA: Diagnosis not present

## 2022-08-08 DIAGNOSIS — E1122 Type 2 diabetes mellitus with diabetic chronic kidney disease: Secondary | ICD-10-CM | POA: Diagnosis not present

## 2022-08-09 DIAGNOSIS — Z992 Dependence on renal dialysis: Secondary | ICD-10-CM | POA: Diagnosis not present

## 2022-08-09 DIAGNOSIS — N2581 Secondary hyperparathyroidism of renal origin: Secondary | ICD-10-CM | POA: Diagnosis not present

## 2022-08-09 DIAGNOSIS — N186 End stage renal disease: Secondary | ICD-10-CM | POA: Diagnosis not present

## 2022-08-11 DIAGNOSIS — Z992 Dependence on renal dialysis: Secondary | ICD-10-CM | POA: Diagnosis not present

## 2022-08-11 DIAGNOSIS — N186 End stage renal disease: Secondary | ICD-10-CM | POA: Diagnosis not present

## 2022-08-11 DIAGNOSIS — N2581 Secondary hyperparathyroidism of renal origin: Secondary | ICD-10-CM | POA: Diagnosis not present

## 2022-08-14 DIAGNOSIS — N186 End stage renal disease: Secondary | ICD-10-CM | POA: Diagnosis not present

## 2022-08-14 DIAGNOSIS — N2581 Secondary hyperparathyroidism of renal origin: Secondary | ICD-10-CM | POA: Diagnosis not present

## 2022-08-14 DIAGNOSIS — Z992 Dependence on renal dialysis: Secondary | ICD-10-CM | POA: Diagnosis not present

## 2022-08-16 ENCOUNTER — Ambulatory Visit: Payer: Medicare HMO | Attending: Cardiovascular Disease | Admitting: Cardiovascular Disease

## 2022-08-16 ENCOUNTER — Encounter: Payer: Self-pay | Admitting: Cardiovascular Disease

## 2022-08-16 VITALS — BP 108/60 | HR 78 | Ht 72.0 in | Wt 195.8 lb

## 2022-08-16 DIAGNOSIS — I10 Essential (primary) hypertension: Secondary | ICD-10-CM

## 2022-08-16 DIAGNOSIS — N2581 Secondary hyperparathyroidism of renal origin: Secondary | ICD-10-CM | POA: Diagnosis not present

## 2022-08-16 DIAGNOSIS — Z992 Dependence on renal dialysis: Secondary | ICD-10-CM | POA: Diagnosis not present

## 2022-08-16 DIAGNOSIS — N186 End stage renal disease: Secondary | ICD-10-CM | POA: Diagnosis not present

## 2022-08-16 DIAGNOSIS — I272 Pulmonary hypertension, unspecified: Secondary | ICD-10-CM | POA: Diagnosis not present

## 2022-08-16 NOTE — Progress Notes (Signed)
08/16/2022 David Nguyen   1940/11/04  [id number]  Primary Physician [name], MD Primary Cardiologist: Lorretta Harp MD [name], [place]  HPI:  David Nguyen is a 81 y.o.   severely overweight married African-American male father of 72 children, grandfather of 58 grandchildren who still does long-term care in the mornings.  His primary care physician is Dr. Dagmar Hait.  I last saw him in the office 12/24/2020.  He did see Dr. Davina Poke in the office 12/05/2019. He was admitted to [facility] 03/31/2019 with bradycardia and discharged 5 days later.  During his hospitalization 2D echo revealed normal LV systolic function, diastolic dysfunction and moderate to severe pulmonary hypertension.  He does have stage III CKD with serum creatinine in the low 3 range though not yet on dialysis.  His other problems include treated hypertension, diabetes and hyperlipidemia.  His beta-blockers were adjusted, initially held and then reinstituted at a lower dose.   When   Since I saw him in the office a year and a half ago he was started on dialysis which she is tolerating.  He denies chest pain or shortness of breath.   No outpatient medications have been marked as taking for the 08/16/22 encounter (Office Visit) with Lorretta Harp, MD.     Allergies  Allergen Reactions   Penicillins Rash and Other (See Comments)    Childhood allergy       Social History   Socioeconomic History   Marital status: Married    Spouse name: Not on file   Number of children: 5   Years of education: Not on file   Highest education level: Not on file  Occupational History   Occupation: retired    Comment: [employer]  Tobacco Use   Smoking status: Former    Packs/day: 0.30    Years: 3.00    Total pack years: 0.90    Types: Cigarettes    Quit date: 10/09/1968    Years since quitting: 53.8   Smokeless tobacco: Never  Vaping Use   Vaping Use: Never used  Substance and Sexual Activity    Alcohol use: No   Drug use: No   Sexual activity: Not on file  Other Topics Concern   Not on file  Social History Narrative   Not on file   Social Determinants of Health   Financial Resource Strain: Not on file  Food Insecurity: Not on file  Transportation Needs: No Transportation Needs (04/22/2020)   PRAPARE - Hydrologist (Medical): No    Lack of Transportation (Non-Medical): No  Physical Activity: Not on file  Stress: Not on file  Social Connections: Not on file  Intimate Partner Violence: Not on file     Review of Systems: General: negative for chills, fever, night sweats or weight changes.  Cardiovascular: negative for chest pain, dyspnea on exertion, edema, orthopnea, palpitations, paroxysmal nocturnal dyspnea or shortness of breath Dermatological: negative for rash Respiratory: negative for cough or wheezing Urologic: negative for hematuria Abdominal: negative for nausea, vomiting, diarrhea, bright red blood per rectum, melena, or hematemesis Neurologic: negative for visual changes, syncope, or dizziness All other systems reviewed and are otherwise negative except as noted above.    Blood pressure 108/60, pulse 78, height 6' (1.829 m), weight 195 lb 12.8 oz (88.8 kg), SpO2 96 %.  General appearance: alert and no distress Neck: no adenopathy, no carotid bruit, no JVD, supple, symmetrical, trachea midline, and thyroid  not enlarged, symmetric, no tenderness/mass/nodules Lungs: clear to auscultation bilaterally Heart: regular rate and rhythm, S1, S2 normal, no murmur, click, rub or gallop Extremities: extremities normal, atraumatic, no cyanosis or edema Pulses: 2+ and symmetric Skin: Skin color, texture, turgor normal. No rashes or lesions Neurologic: Grossly normal  EKG sinus rhythm at 78 with right bundle branch block and left anterior fascicular block.  I personally reviewed this EKG.  ASSESSMENT AND PLAN:   HYPERLIPIDEMIA History of  hyperlipidemia on statin therapy with lipid profile performed//23 revealing total cholesterol 127, LDL 77 and HDL 36.  Essential hypertension History of essential hypertension a blood pressure measured today at 108/60.  He is on carvedilol.  Pulmonary hypertension ([place]) History of severe pulmonary hypertension by 2D echo performed 03/31/2019.  He had normal LV function.     Lorretta Harp MD FACP,FACC,FAHA, [facility] 08/16/2022 3:42 PM

## 2022-08-16 NOTE — Patient Instructions (Signed)
Medication Instructions:  NO CHANGES  *If you need a refill on your cardiac medications before your next appointment, please call your pharmacy*   Follow-Up: At [facility], you and your health needs are our priority.  As part of our continuing mission to provide you with exceptional heart care, we have created designated Provider Care Teams.  These Care Teams include your primary Cardiologist (physician) and Advanced Practice Providers (APPs -  Physician Assistants and Nurse Practitioners) who all work together to provide you with the care you need, when you need it.  We recommend signing up for the patient portal called "MyChart".  Sign up information is provided on this After Visit Summary.  MyChart is used to connect with patients for Virtual Visits (Telemedicine).  Patients are able to view lab/test results, encounter notes, upcoming appointments, etc.  Non-urgent messages can be sent to your provider as well.   To learn more about what you can do with MyChart, go to [email or web address].    Your next appointment:     AS NEEDED with Dr. [name]

## 2022-08-16 NOTE — Assessment & Plan Note (Signed)
History of hyperlipidemia on statin therapy with lipid profile performed//23 revealing total cholesterol 127, LDL 77 and HDL 36.

## 2022-08-16 NOTE — Assessment & Plan Note (Signed)
History of essential hypertension a blood pressure measured today at 108/60.  He is on carvedilol.

## 2022-08-16 NOTE — Assessment & Plan Note (Signed)
History of severe pulmonary hypertension by 2D echo performed 03/31/2019.  He had normal LV function.

## 2022-08-18 DIAGNOSIS — N2581 Secondary hyperparathyroidism of renal origin: Secondary | ICD-10-CM | POA: Diagnosis not present

## 2022-08-18 DIAGNOSIS — N186 End stage renal disease: Secondary | ICD-10-CM | POA: Diagnosis not present

## 2022-08-18 DIAGNOSIS — Z992 Dependence on renal dialysis: Secondary | ICD-10-CM | POA: Diagnosis not present

## 2022-08-21 DIAGNOSIS — Z992 Dependence on renal dialysis: Secondary | ICD-10-CM | POA: Diagnosis not present

## 2022-08-21 DIAGNOSIS — M1712 Unilateral primary osteoarthritis, left knee: Secondary | ICD-10-CM | POA: Diagnosis not present

## 2022-08-21 DIAGNOSIS — M5416 Radiculopathy, lumbar region: Secondary | ICD-10-CM | POA: Diagnosis not present

## 2022-08-21 DIAGNOSIS — N2581 Secondary hyperparathyroidism of renal origin: Secondary | ICD-10-CM | POA: Diagnosis not present

## 2022-08-21 DIAGNOSIS — R29898 Other symptoms and signs involving the musculoskeletal system: Secondary | ICD-10-CM | POA: Diagnosis not present

## 2022-08-21 DIAGNOSIS — N186 End stage renal disease: Secondary | ICD-10-CM | POA: Diagnosis not present

## 2022-08-21 DIAGNOSIS — R2681 Unsteadiness on feet: Secondary | ICD-10-CM | POA: Diagnosis not present

## 2022-08-23 DIAGNOSIS — N2581 Secondary hyperparathyroidism of renal origin: Secondary | ICD-10-CM | POA: Diagnosis not present

## 2022-08-23 DIAGNOSIS — N186 End stage renal disease: Secondary | ICD-10-CM | POA: Diagnosis not present

## 2022-08-23 DIAGNOSIS — Z992 Dependence on renal dialysis: Secondary | ICD-10-CM | POA: Diagnosis not present

## 2022-08-25 DIAGNOSIS — Z992 Dependence on renal dialysis: Secondary | ICD-10-CM | POA: Diagnosis not present

## 2022-08-25 DIAGNOSIS — N186 End stage renal disease: Secondary | ICD-10-CM | POA: Diagnosis not present

## 2022-08-25 DIAGNOSIS — N2581 Secondary hyperparathyroidism of renal origin: Secondary | ICD-10-CM | POA: Diagnosis not present

## 2022-08-27 DIAGNOSIS — Z992 Dependence on renal dialysis: Secondary | ICD-10-CM | POA: Diagnosis not present

## 2022-08-27 DIAGNOSIS — N2581 Secondary hyperparathyroidism of renal origin: Secondary | ICD-10-CM | POA: Diagnosis not present

## 2022-08-27 DIAGNOSIS — N186 End stage renal disease: Secondary | ICD-10-CM | POA: Diagnosis not present

## 2022-08-29 DIAGNOSIS — Z992 Dependence on renal dialysis: Secondary | ICD-10-CM | POA: Diagnosis not present

## 2022-08-29 DIAGNOSIS — N2581 Secondary hyperparathyroidism of renal origin: Secondary | ICD-10-CM | POA: Diagnosis not present

## 2022-08-29 DIAGNOSIS — N186 End stage renal disease: Secondary | ICD-10-CM | POA: Diagnosis not present

## 2022-09-01 DIAGNOSIS — Z992 Dependence on renal dialysis: Secondary | ICD-10-CM | POA: Diagnosis not present

## 2022-09-01 DIAGNOSIS — N186 End stage renal disease: Secondary | ICD-10-CM | POA: Diagnosis not present

## 2022-09-01 DIAGNOSIS — N2581 Secondary hyperparathyroidism of renal origin: Secondary | ICD-10-CM | POA: Diagnosis not present

## 2022-09-04 DIAGNOSIS — Z992 Dependence on renal dialysis: Secondary | ICD-10-CM | POA: Diagnosis not present

## 2022-09-04 DIAGNOSIS — N186 End stage renal disease: Secondary | ICD-10-CM | POA: Diagnosis not present

## 2022-09-04 DIAGNOSIS — N2581 Secondary hyperparathyroidism of renal origin: Secondary | ICD-10-CM | POA: Diagnosis not present

## 2022-09-06 DIAGNOSIS — N186 End stage renal disease: Secondary | ICD-10-CM | POA: Diagnosis not present

## 2022-09-06 DIAGNOSIS — Z992 Dependence on renal dialysis: Secondary | ICD-10-CM | POA: Diagnosis not present

## 2022-09-06 DIAGNOSIS — N2581 Secondary hyperparathyroidism of renal origin: Secondary | ICD-10-CM | POA: Diagnosis not present

## 2022-09-07 DIAGNOSIS — E1122 Type 2 diabetes mellitus with diabetic chronic kidney disease: Secondary | ICD-10-CM | POA: Diagnosis not present

## 2022-09-07 DIAGNOSIS — Z992 Dependence on renal dialysis: Secondary | ICD-10-CM | POA: Diagnosis not present

## 2022-09-07 DIAGNOSIS — N186 End stage renal disease: Secondary | ICD-10-CM | POA: Diagnosis not present

## 2022-09-08 DIAGNOSIS — N2581 Secondary hyperparathyroidism of renal origin: Secondary | ICD-10-CM | POA: Diagnosis not present

## 2022-09-08 DIAGNOSIS — Z992 Dependence on renal dialysis: Secondary | ICD-10-CM | POA: Diagnosis not present

## 2022-09-08 DIAGNOSIS — N186 End stage renal disease: Secondary | ICD-10-CM | POA: Diagnosis not present

## 2022-09-08 DIAGNOSIS — G4733 Obstructive sleep apnea (adult) (pediatric): Secondary | ICD-10-CM | POA: Diagnosis not present

## 2022-09-11 DIAGNOSIS — N186 End stage renal disease: Secondary | ICD-10-CM | POA: Diagnosis not present

## 2022-09-11 DIAGNOSIS — N2581 Secondary hyperparathyroidism of renal origin: Secondary | ICD-10-CM | POA: Diagnosis not present

## 2022-09-11 DIAGNOSIS — Z992 Dependence on renal dialysis: Secondary | ICD-10-CM | POA: Diagnosis not present

## 2022-09-13 DIAGNOSIS — N186 End stage renal disease: Secondary | ICD-10-CM | POA: Diagnosis not present

## 2022-09-13 DIAGNOSIS — Z992 Dependence on renal dialysis: Secondary | ICD-10-CM | POA: Diagnosis not present

## 2022-09-13 DIAGNOSIS — N2581 Secondary hyperparathyroidism of renal origin: Secondary | ICD-10-CM | POA: Diagnosis not present

## 2022-09-15 DIAGNOSIS — Z992 Dependence on renal dialysis: Secondary | ICD-10-CM | POA: Diagnosis not present

## 2022-09-15 DIAGNOSIS — N186 End stage renal disease: Secondary | ICD-10-CM | POA: Diagnosis not present

## 2022-09-15 DIAGNOSIS — N2581 Secondary hyperparathyroidism of renal origin: Secondary | ICD-10-CM | POA: Diagnosis not present

## 2022-09-18 DIAGNOSIS — N2581 Secondary hyperparathyroidism of renal origin: Secondary | ICD-10-CM | POA: Diagnosis not present

## 2022-09-18 DIAGNOSIS — N186 End stage renal disease: Secondary | ICD-10-CM | POA: Diagnosis not present

## 2022-09-18 DIAGNOSIS — Z992 Dependence on renal dialysis: Secondary | ICD-10-CM | POA: Diagnosis not present

## 2022-09-20 DIAGNOSIS — N2581 Secondary hyperparathyroidism of renal origin: Secondary | ICD-10-CM | POA: Diagnosis not present

## 2022-09-20 DIAGNOSIS — N186 End stage renal disease: Secondary | ICD-10-CM | POA: Diagnosis not present

## 2022-09-20 DIAGNOSIS — Z992 Dependence on renal dialysis: Secondary | ICD-10-CM | POA: Diagnosis not present

## 2022-09-22 DIAGNOSIS — Z992 Dependence on renal dialysis: Secondary | ICD-10-CM | POA: Diagnosis not present

## 2022-09-22 DIAGNOSIS — N186 End stage renal disease: Secondary | ICD-10-CM | POA: Diagnosis not present

## 2022-09-22 DIAGNOSIS — N2581 Secondary hyperparathyroidism of renal origin: Secondary | ICD-10-CM | POA: Diagnosis not present

## 2022-09-25 DIAGNOSIS — N2581 Secondary hyperparathyroidism of renal origin: Secondary | ICD-10-CM | POA: Diagnosis not present

## 2022-09-25 DIAGNOSIS — N186 End stage renal disease: Secondary | ICD-10-CM | POA: Diagnosis not present

## 2022-09-25 DIAGNOSIS — Z992 Dependence on renal dialysis: Secondary | ICD-10-CM | POA: Diagnosis not present

## 2022-09-27 ENCOUNTER — Ambulatory Visit (INDEPENDENT_AMBULATORY_CARE_PROVIDER_SITE_OTHER): Payer: Medicare HMO | Admitting: Podiatry

## 2022-09-27 ENCOUNTER — Encounter: Payer: Self-pay | Admitting: Podiatry

## 2022-09-27 DIAGNOSIS — N2581 Secondary hyperparathyroidism of renal origin: Secondary | ICD-10-CM | POA: Diagnosis not present

## 2022-09-27 DIAGNOSIS — M79674 Pain in right toe(s): Secondary | ICD-10-CM

## 2022-09-27 DIAGNOSIS — Z992 Dependence on renal dialysis: Secondary | ICD-10-CM | POA: Diagnosis not present

## 2022-09-27 DIAGNOSIS — E118 Type 2 diabetes mellitus with unspecified complications: Secondary | ICD-10-CM

## 2022-09-27 DIAGNOSIS — B351 Tinea unguium: Secondary | ICD-10-CM

## 2022-09-27 DIAGNOSIS — M79675 Pain in left toe(s): Secondary | ICD-10-CM | POA: Diagnosis not present

## 2022-09-27 DIAGNOSIS — N184 Chronic kidney disease, stage 4 (severe): Secondary | ICD-10-CM | POA: Diagnosis not present

## 2022-09-27 DIAGNOSIS — N186 End stage renal disease: Secondary | ICD-10-CM | POA: Diagnosis not present

## 2022-09-27 DIAGNOSIS — E1122 Type 2 diabetes mellitus with diabetic chronic kidney disease: Secondary | ICD-10-CM

## 2022-09-27 NOTE — Progress Notes (Signed)
This patient returns to my office for at risk foot care.  This patient requires this care by a professional since this patient will be at risk due to having CKD, and type 2 diabetes  This patient is unable to cut nails himself since the patient cannot reach his nails.These nails are painful walking and wearing shoes.  This patient presents for at risk foot care today.  General Appearance  Alert, conversant and in no acute stress.  Vascular  Dorsalis pedis and posterior tibial  pulses are weakly palpable  bilaterally.  Capillary return is within normal limits  bilaterally. Temperature is within normal limits  bilaterally.  Neurologic  Senn-Weinstein monofilament wire test within normal limits  bilaterally. Muscle power within normal limits bilaterally.  Nails Thick disfigured discolored nails with subungual debris  from hallux to fifth toes bilaterally. No evidence of bacterial infection or drainage bilaterally.  Orthopedic  No limitations of motion  feet .  No crepitus or effusions noted.  No bony pathology or digital deformities noted. HAV  B/L.  Skin  normotropic skin with no porokeratosis noted bilaterally.  No signs of infections or ulcers noted.     Onychomycosis  Pain in right toes  Pain in left toes  Consent was obtained for treatment procedures.   Mechanical debridement of nails 1-5  bilaterally performed with a nail nipper.  Filed with dremel without incident.    Return office visit    3 months                 Told patient to return for periodic foot care and evaluation due to potential at risk complications.   Gardiner Barefoot DPM

## 2022-09-29 DIAGNOSIS — N186 End stage renal disease: Secondary | ICD-10-CM | POA: Diagnosis not present

## 2022-09-29 DIAGNOSIS — Z992 Dependence on renal dialysis: Secondary | ICD-10-CM | POA: Diagnosis not present

## 2022-09-29 DIAGNOSIS — N2581 Secondary hyperparathyroidism of renal origin: Secondary | ICD-10-CM | POA: Diagnosis not present

## 2022-10-01 DIAGNOSIS — N186 End stage renal disease: Secondary | ICD-10-CM | POA: Diagnosis not present

## 2022-10-01 DIAGNOSIS — Z992 Dependence on renal dialysis: Secondary | ICD-10-CM | POA: Diagnosis not present

## 2022-10-01 DIAGNOSIS — N2581 Secondary hyperparathyroidism of renal origin: Secondary | ICD-10-CM | POA: Diagnosis not present

## 2022-10-04 DIAGNOSIS — N186 End stage renal disease: Secondary | ICD-10-CM | POA: Diagnosis not present

## 2022-10-04 DIAGNOSIS — Z992 Dependence on renal dialysis: Secondary | ICD-10-CM | POA: Diagnosis not present

## 2022-10-04 DIAGNOSIS — N2581 Secondary hyperparathyroidism of renal origin: Secondary | ICD-10-CM | POA: Diagnosis not present

## 2022-10-06 DIAGNOSIS — N2581 Secondary hyperparathyroidism of renal origin: Secondary | ICD-10-CM | POA: Diagnosis not present

## 2022-10-06 DIAGNOSIS — N186 End stage renal disease: Secondary | ICD-10-CM | POA: Diagnosis not present

## 2022-10-06 DIAGNOSIS — Z992 Dependence on renal dialysis: Secondary | ICD-10-CM | POA: Diagnosis not present

## 2022-10-08 DIAGNOSIS — Z992 Dependence on renal dialysis: Secondary | ICD-10-CM | POA: Diagnosis not present

## 2022-10-08 DIAGNOSIS — N186 End stage renal disease: Secondary | ICD-10-CM | POA: Diagnosis not present

## 2022-10-08 DIAGNOSIS — E1122 Type 2 diabetes mellitus with diabetic chronic kidney disease: Secondary | ICD-10-CM | POA: Diagnosis not present

## 2022-10-08 DIAGNOSIS — N2581 Secondary hyperparathyroidism of renal origin: Secondary | ICD-10-CM | POA: Diagnosis not present

## 2022-10-11 DIAGNOSIS — N2581 Secondary hyperparathyroidism of renal origin: Secondary | ICD-10-CM | POA: Diagnosis not present

## 2022-10-11 DIAGNOSIS — N186 End stage renal disease: Secondary | ICD-10-CM | POA: Diagnosis not present

## 2022-10-11 DIAGNOSIS — Z992 Dependence on renal dialysis: Secondary | ICD-10-CM | POA: Diagnosis not present

## 2022-10-13 DIAGNOSIS — N2581 Secondary hyperparathyroidism of renal origin: Secondary | ICD-10-CM | POA: Diagnosis not present

## 2022-10-13 DIAGNOSIS — Z992 Dependence on renal dialysis: Secondary | ICD-10-CM | POA: Diagnosis not present

## 2022-10-13 DIAGNOSIS — N186 End stage renal disease: Secondary | ICD-10-CM | POA: Diagnosis not present

## 2022-10-16 DIAGNOSIS — N186 End stage renal disease: Secondary | ICD-10-CM | POA: Diagnosis not present

## 2022-10-16 DIAGNOSIS — Z992 Dependence on renal dialysis: Secondary | ICD-10-CM | POA: Diagnosis not present

## 2022-10-16 DIAGNOSIS — N2581 Secondary hyperparathyroidism of renal origin: Secondary | ICD-10-CM | POA: Diagnosis not present

## 2022-10-17 DIAGNOSIS — R2681 Unsteadiness on feet: Secondary | ICD-10-CM | POA: Diagnosis not present

## 2022-10-17 DIAGNOSIS — G894 Chronic pain syndrome: Secondary | ICD-10-CM | POA: Diagnosis not present

## 2022-10-17 DIAGNOSIS — L942 Calcinosis cutis: Secondary | ICD-10-CM | POA: Diagnosis not present

## 2022-10-17 DIAGNOSIS — R269 Unspecified abnormalities of gait and mobility: Secondary | ICD-10-CM | POA: Diagnosis not present

## 2022-10-17 DIAGNOSIS — I872 Venous insufficiency (chronic) (peripheral): Secondary | ICD-10-CM | POA: Diagnosis not present

## 2022-10-17 DIAGNOSIS — Z23 Encounter for immunization: Secondary | ICD-10-CM | POA: Diagnosis not present

## 2022-10-17 DIAGNOSIS — M5416 Radiculopathy, lumbar region: Secondary | ICD-10-CM | POA: Diagnosis not present

## 2022-10-17 DIAGNOSIS — R29898 Other symptoms and signs involving the musculoskeletal system: Secondary | ICD-10-CM | POA: Diagnosis not present

## 2022-10-17 DIAGNOSIS — E1129 Type 2 diabetes mellitus with other diabetic kidney complication: Secondary | ICD-10-CM | POA: Diagnosis not present

## 2022-10-17 DIAGNOSIS — M1712 Unilateral primary osteoarthritis, left knee: Secondary | ICD-10-CM | POA: Diagnosis not present

## 2022-10-18 DIAGNOSIS — Z992 Dependence on renal dialysis: Secondary | ICD-10-CM | POA: Diagnosis not present

## 2022-10-18 DIAGNOSIS — N186 End stage renal disease: Secondary | ICD-10-CM | POA: Diagnosis not present

## 2022-10-18 DIAGNOSIS — N2581 Secondary hyperparathyroidism of renal origin: Secondary | ICD-10-CM | POA: Diagnosis not present

## 2022-10-20 DIAGNOSIS — N186 End stage renal disease: Secondary | ICD-10-CM | POA: Diagnosis not present

## 2022-10-20 DIAGNOSIS — Z992 Dependence on renal dialysis: Secondary | ICD-10-CM | POA: Diagnosis not present

## 2022-10-20 DIAGNOSIS — N2581 Secondary hyperparathyroidism of renal origin: Secondary | ICD-10-CM | POA: Diagnosis not present

## 2022-10-23 DIAGNOSIS — N186 End stage renal disease: Secondary | ICD-10-CM | POA: Diagnosis not present

## 2022-10-23 DIAGNOSIS — Z992 Dependence on renal dialysis: Secondary | ICD-10-CM | POA: Diagnosis not present

## 2022-10-23 DIAGNOSIS — N2581 Secondary hyperparathyroidism of renal origin: Secondary | ICD-10-CM | POA: Diagnosis not present

## 2022-10-25 DIAGNOSIS — N2581 Secondary hyperparathyroidism of renal origin: Secondary | ICD-10-CM | POA: Diagnosis not present

## 2022-10-25 DIAGNOSIS — N186 End stage renal disease: Secondary | ICD-10-CM | POA: Diagnosis not present

## 2022-10-25 DIAGNOSIS — Z992 Dependence on renal dialysis: Secondary | ICD-10-CM | POA: Diagnosis not present

## 2022-10-27 DIAGNOSIS — Z992 Dependence on renal dialysis: Secondary | ICD-10-CM | POA: Diagnosis not present

## 2022-10-27 DIAGNOSIS — N2581 Secondary hyperparathyroidism of renal origin: Secondary | ICD-10-CM | POA: Diagnosis not present

## 2022-10-27 DIAGNOSIS — N186 End stage renal disease: Secondary | ICD-10-CM | POA: Diagnosis not present

## 2022-10-30 DIAGNOSIS — Z992 Dependence on renal dialysis: Secondary | ICD-10-CM | POA: Diagnosis not present

## 2022-10-30 DIAGNOSIS — N2581 Secondary hyperparathyroidism of renal origin: Secondary | ICD-10-CM | POA: Diagnosis not present

## 2022-10-30 DIAGNOSIS — N186 End stage renal disease: Secondary | ICD-10-CM | POA: Diagnosis not present

## 2022-11-01 DIAGNOSIS — N2581 Secondary hyperparathyroidism of renal origin: Secondary | ICD-10-CM | POA: Diagnosis not present

## 2022-11-01 DIAGNOSIS — N186 End stage renal disease: Secondary | ICD-10-CM | POA: Diagnosis not present

## 2022-11-01 DIAGNOSIS — Z992 Dependence on renal dialysis: Secondary | ICD-10-CM | POA: Diagnosis not present

## 2022-11-03 DIAGNOSIS — N2581 Secondary hyperparathyroidism of renal origin: Secondary | ICD-10-CM | POA: Diagnosis not present

## 2022-11-03 DIAGNOSIS — N186 End stage renal disease: Secondary | ICD-10-CM | POA: Diagnosis not present

## 2022-11-03 DIAGNOSIS — Z992 Dependence on renal dialysis: Secondary | ICD-10-CM | POA: Diagnosis not present

## 2022-11-06 DIAGNOSIS — N186 End stage renal disease: Secondary | ICD-10-CM | POA: Diagnosis not present

## 2022-11-06 DIAGNOSIS — N2581 Secondary hyperparathyroidism of renal origin: Secondary | ICD-10-CM | POA: Diagnosis not present

## 2022-11-06 DIAGNOSIS — Z992 Dependence on renal dialysis: Secondary | ICD-10-CM | POA: Diagnosis not present

## 2022-11-08 DIAGNOSIS — E1122 Type 2 diabetes mellitus with diabetic chronic kidney disease: Secondary | ICD-10-CM | POA: Diagnosis not present

## 2022-11-08 DIAGNOSIS — N2581 Secondary hyperparathyroidism of renal origin: Secondary | ICD-10-CM | POA: Diagnosis not present

## 2022-11-08 DIAGNOSIS — N186 End stage renal disease: Secondary | ICD-10-CM | POA: Diagnosis not present

## 2022-11-08 DIAGNOSIS — Z992 Dependence on renal dialysis: Secondary | ICD-10-CM | POA: Diagnosis not present

## 2022-11-10 DIAGNOSIS — Z992 Dependence on renal dialysis: Secondary | ICD-10-CM | POA: Diagnosis not present

## 2022-11-10 DIAGNOSIS — N2581 Secondary hyperparathyroidism of renal origin: Secondary | ICD-10-CM | POA: Diagnosis not present

## 2022-11-10 DIAGNOSIS — N186 End stage renal disease: Secondary | ICD-10-CM | POA: Diagnosis not present

## 2022-11-13 DIAGNOSIS — N186 End stage renal disease: Secondary | ICD-10-CM | POA: Diagnosis not present

## 2022-11-13 DIAGNOSIS — Z992 Dependence on renal dialysis: Secondary | ICD-10-CM | POA: Diagnosis not present

## 2022-11-13 DIAGNOSIS — N2581 Secondary hyperparathyroidism of renal origin: Secondary | ICD-10-CM | POA: Diagnosis not present

## 2022-11-15 DIAGNOSIS — Z992 Dependence on renal dialysis: Secondary | ICD-10-CM | POA: Diagnosis not present

## 2022-11-15 DIAGNOSIS — N186 End stage renal disease: Secondary | ICD-10-CM | POA: Diagnosis not present

## 2022-11-15 DIAGNOSIS — N2581 Secondary hyperparathyroidism of renal origin: Secondary | ICD-10-CM | POA: Diagnosis not present

## 2022-11-17 DIAGNOSIS — N186 End stage renal disease: Secondary | ICD-10-CM | POA: Diagnosis not present

## 2022-11-17 DIAGNOSIS — Z992 Dependence on renal dialysis: Secondary | ICD-10-CM | POA: Diagnosis not present

## 2022-11-17 DIAGNOSIS — N2581 Secondary hyperparathyroidism of renal origin: Secondary | ICD-10-CM | POA: Diagnosis not present

## 2022-11-20 DIAGNOSIS — N2581 Secondary hyperparathyroidism of renal origin: Secondary | ICD-10-CM | POA: Diagnosis not present

## 2022-11-20 DIAGNOSIS — N186 End stage renal disease: Secondary | ICD-10-CM | POA: Diagnosis not present

## 2022-11-20 DIAGNOSIS — Z992 Dependence on renal dialysis: Secondary | ICD-10-CM | POA: Diagnosis not present

## 2022-11-22 DIAGNOSIS — Z992 Dependence on renal dialysis: Secondary | ICD-10-CM | POA: Diagnosis not present

## 2022-11-22 DIAGNOSIS — N2581 Secondary hyperparathyroidism of renal origin: Secondary | ICD-10-CM | POA: Diagnosis not present

## 2022-11-22 DIAGNOSIS — N186 End stage renal disease: Secondary | ICD-10-CM | POA: Diagnosis not present

## 2022-11-24 DIAGNOSIS — N2581 Secondary hyperparathyroidism of renal origin: Secondary | ICD-10-CM | POA: Diagnosis not present

## 2022-11-24 DIAGNOSIS — N186 End stage renal disease: Secondary | ICD-10-CM | POA: Diagnosis not present

## 2022-11-24 DIAGNOSIS — Z992 Dependence on renal dialysis: Secondary | ICD-10-CM | POA: Diagnosis not present

## 2022-11-27 DIAGNOSIS — N186 End stage renal disease: Secondary | ICD-10-CM | POA: Diagnosis not present

## 2022-11-27 DIAGNOSIS — N2581 Secondary hyperparathyroidism of renal origin: Secondary | ICD-10-CM | POA: Diagnosis not present

## 2022-11-27 DIAGNOSIS — Z992 Dependence on renal dialysis: Secondary | ICD-10-CM | POA: Diagnosis not present

## 2022-11-29 DIAGNOSIS — N186 End stage renal disease: Secondary | ICD-10-CM | POA: Diagnosis not present

## 2022-11-29 DIAGNOSIS — Z992 Dependence on renal dialysis: Secondary | ICD-10-CM | POA: Diagnosis not present

## 2022-11-29 DIAGNOSIS — N2581 Secondary hyperparathyroidism of renal origin: Secondary | ICD-10-CM | POA: Diagnosis not present

## 2022-12-01 DIAGNOSIS — N2581 Secondary hyperparathyroidism of renal origin: Secondary | ICD-10-CM | POA: Diagnosis not present

## 2022-12-01 DIAGNOSIS — Z992 Dependence on renal dialysis: Secondary | ICD-10-CM | POA: Diagnosis not present

## 2022-12-01 DIAGNOSIS — N186 End stage renal disease: Secondary | ICD-10-CM | POA: Diagnosis not present

## 2022-12-04 DIAGNOSIS — N2581 Secondary hyperparathyroidism of renal origin: Secondary | ICD-10-CM | POA: Diagnosis not present

## 2022-12-04 DIAGNOSIS — Z992 Dependence on renal dialysis: Secondary | ICD-10-CM | POA: Diagnosis not present

## 2022-12-04 DIAGNOSIS — N186 End stage renal disease: Secondary | ICD-10-CM | POA: Diagnosis not present

## 2022-12-05 DIAGNOSIS — D2372 Other benign neoplasm of skin of left lower limb, including hip: Secondary | ICD-10-CM | POA: Diagnosis not present

## 2022-12-06 DIAGNOSIS — N186 End stage renal disease: Secondary | ICD-10-CM | POA: Diagnosis not present

## 2022-12-06 DIAGNOSIS — N2581 Secondary hyperparathyroidism of renal origin: Secondary | ICD-10-CM | POA: Diagnosis not present

## 2022-12-06 DIAGNOSIS — Z992 Dependence on renal dialysis: Secondary | ICD-10-CM | POA: Diagnosis not present

## 2022-12-07 DIAGNOSIS — N186 End stage renal disease: Secondary | ICD-10-CM | POA: Diagnosis not present

## 2022-12-07 DIAGNOSIS — Z992 Dependence on renal dialysis: Secondary | ICD-10-CM | POA: Diagnosis not present

## 2022-12-07 DIAGNOSIS — E1122 Type 2 diabetes mellitus with diabetic chronic kidney disease: Secondary | ICD-10-CM | POA: Diagnosis not present

## 2022-12-08 DIAGNOSIS — N186 End stage renal disease: Secondary | ICD-10-CM | POA: Diagnosis not present

## 2022-12-08 DIAGNOSIS — N2581 Secondary hyperparathyroidism of renal origin: Secondary | ICD-10-CM | POA: Diagnosis not present

## 2022-12-08 DIAGNOSIS — Z992 Dependence on renal dialysis: Secondary | ICD-10-CM | POA: Diagnosis not present

## 2022-12-11 DIAGNOSIS — N186 End stage renal disease: Secondary | ICD-10-CM | POA: Diagnosis not present

## 2022-12-11 DIAGNOSIS — N2581 Secondary hyperparathyroidism of renal origin: Secondary | ICD-10-CM | POA: Diagnosis not present

## 2022-12-11 DIAGNOSIS — Z992 Dependence on renal dialysis: Secondary | ICD-10-CM | POA: Diagnosis not present

## 2022-12-13 DIAGNOSIS — Z992 Dependence on renal dialysis: Secondary | ICD-10-CM | POA: Diagnosis not present

## 2022-12-13 DIAGNOSIS — N186 End stage renal disease: Secondary | ICD-10-CM | POA: Diagnosis not present

## 2022-12-13 DIAGNOSIS — N2581 Secondary hyperparathyroidism of renal origin: Secondary | ICD-10-CM | POA: Diagnosis not present

## 2022-12-15 DIAGNOSIS — Z992 Dependence on renal dialysis: Secondary | ICD-10-CM | POA: Diagnosis not present

## 2022-12-15 DIAGNOSIS — N2581 Secondary hyperparathyroidism of renal origin: Secondary | ICD-10-CM | POA: Diagnosis not present

## 2022-12-15 DIAGNOSIS — N186 End stage renal disease: Secondary | ICD-10-CM | POA: Diagnosis not present

## 2022-12-18 DIAGNOSIS — N2581 Secondary hyperparathyroidism of renal origin: Secondary | ICD-10-CM | POA: Diagnosis not present

## 2022-12-18 DIAGNOSIS — Z992 Dependence on renal dialysis: Secondary | ICD-10-CM | POA: Diagnosis not present

## 2022-12-18 DIAGNOSIS — N186 End stage renal disease: Secondary | ICD-10-CM | POA: Diagnosis not present

## 2022-12-20 DIAGNOSIS — N186 End stage renal disease: Secondary | ICD-10-CM | POA: Diagnosis not present

## 2022-12-20 DIAGNOSIS — N2581 Secondary hyperparathyroidism of renal origin: Secondary | ICD-10-CM | POA: Diagnosis not present

## 2022-12-20 DIAGNOSIS — Z992 Dependence on renal dialysis: Secondary | ICD-10-CM | POA: Diagnosis not present

## 2022-12-22 DIAGNOSIS — N186 End stage renal disease: Secondary | ICD-10-CM | POA: Diagnosis not present

## 2022-12-22 DIAGNOSIS — Z992 Dependence on renal dialysis: Secondary | ICD-10-CM | POA: Diagnosis not present

## 2022-12-22 DIAGNOSIS — N2581 Secondary hyperparathyroidism of renal origin: Secondary | ICD-10-CM | POA: Diagnosis not present

## 2022-12-25 DIAGNOSIS — N186 End stage renal disease: Secondary | ICD-10-CM | POA: Diagnosis not present

## 2022-12-25 DIAGNOSIS — Z992 Dependence on renal dialysis: Secondary | ICD-10-CM | POA: Diagnosis not present

## 2022-12-25 DIAGNOSIS — N2581 Secondary hyperparathyroidism of renal origin: Secondary | ICD-10-CM | POA: Diagnosis not present

## 2022-12-26 ENCOUNTER — Telehealth: Payer: Self-pay | Admitting: Plastic Surgery

## 2022-12-26 NOTE — Telephone Encounter (Signed)
Reminder letter mailed to patient

## 2022-12-27 DIAGNOSIS — Z992 Dependence on renal dialysis: Secondary | ICD-10-CM | POA: Diagnosis not present

## 2022-12-27 DIAGNOSIS — N2581 Secondary hyperparathyroidism of renal origin: Secondary | ICD-10-CM | POA: Diagnosis not present

## 2022-12-27 DIAGNOSIS — N186 End stage renal disease: Secondary | ICD-10-CM | POA: Diagnosis not present

## 2022-12-29 DIAGNOSIS — N2581 Secondary hyperparathyroidism of renal origin: Secondary | ICD-10-CM | POA: Diagnosis not present

## 2022-12-29 DIAGNOSIS — N186 End stage renal disease: Secondary | ICD-10-CM | POA: Diagnosis not present

## 2022-12-29 DIAGNOSIS — Z992 Dependence on renal dialysis: Secondary | ICD-10-CM | POA: Diagnosis not present

## 2023-01-01 DIAGNOSIS — N2581 Secondary hyperparathyroidism of renal origin: Secondary | ICD-10-CM | POA: Diagnosis not present

## 2023-01-01 DIAGNOSIS — Z992 Dependence on renal dialysis: Secondary | ICD-10-CM | POA: Diagnosis not present

## 2023-01-01 DIAGNOSIS — N186 End stage renal disease: Secondary | ICD-10-CM | POA: Diagnosis not present

## 2023-01-02 ENCOUNTER — Encounter: Payer: Self-pay | Admitting: Podiatry

## 2023-01-02 ENCOUNTER — Ambulatory Visit (INDEPENDENT_AMBULATORY_CARE_PROVIDER_SITE_OTHER): Payer: Medicare HMO | Admitting: Podiatry

## 2023-01-02 DIAGNOSIS — E118 Type 2 diabetes mellitus with unspecified complications: Secondary | ICD-10-CM

## 2023-01-02 DIAGNOSIS — M79675 Pain in left toe(s): Secondary | ICD-10-CM

## 2023-01-02 DIAGNOSIS — B351 Tinea unguium: Secondary | ICD-10-CM

## 2023-01-02 DIAGNOSIS — M79674 Pain in right toe(s): Secondary | ICD-10-CM | POA: Diagnosis not present

## 2023-01-02 NOTE — Progress Notes (Signed)
This patient returns to my office for at risk foot care.  This patient requires this care by a professional since this patient will be at risk due to having CKD, and type 2 diabetes  This patient is unable to cut nails himself since the patient cannot reach his nails.These nails are painful walking and wearing shoes.  This patient presents for at risk foot care today.  General Appearance  Alert, conversant and in no acute stress.  Vascular  Dorsalis pedis and posterior tibial  pulses are weakly palpable  bilaterally.  Capillary return is within normal limits  bilaterally. Temperature is within normal limits  bilaterally.  Neurologic  Senn-Weinstein monofilament wire test within normal limits  bilaterally. Muscle power within normal limits bilaterally.  Nails Thick disfigured discolored nails with subungual debris  from hallux to fifth toes bilaterally. No evidence of bacterial infection or drainage bilaterally.  Orthopedic  No limitations of motion  feet .  No crepitus or effusions noted.  No bony pathology or digital deformities noted. HAV  B/L.  Skin  normotropic skin with no porokeratosis noted bilaterally.  No signs of infections or ulcers noted.     Onychomycosis  Pain in right toes  Pain in left toes  Consent was obtained for treatment procedures.   Mechanical debridement of nails 1-5  bilaterally performed with a nail nipper.  Filed with dremel without incident.    Return office visit    3 months                 Told patient to return for periodic foot care and evaluation due to potential at risk complications.   Garritt Molyneux DPM  

## 2023-01-03 DIAGNOSIS — N2581 Secondary hyperparathyroidism of renal origin: Secondary | ICD-10-CM | POA: Diagnosis not present

## 2023-01-03 DIAGNOSIS — N186 End stage renal disease: Secondary | ICD-10-CM | POA: Diagnosis not present

## 2023-01-03 DIAGNOSIS — Z992 Dependence on renal dialysis: Secondary | ICD-10-CM | POA: Diagnosis not present

## 2023-01-05 DIAGNOSIS — N186 End stage renal disease: Secondary | ICD-10-CM | POA: Diagnosis not present

## 2023-01-05 DIAGNOSIS — N2581 Secondary hyperparathyroidism of renal origin: Secondary | ICD-10-CM | POA: Diagnosis not present

## 2023-01-05 DIAGNOSIS — Z992 Dependence on renal dialysis: Secondary | ICD-10-CM | POA: Diagnosis not present

## 2023-01-07 DIAGNOSIS — N186 End stage renal disease: Secondary | ICD-10-CM | POA: Diagnosis not present

## 2023-01-07 DIAGNOSIS — E1122 Type 2 diabetes mellitus with diabetic chronic kidney disease: Secondary | ICD-10-CM | POA: Diagnosis not present

## 2023-01-07 DIAGNOSIS — Z992 Dependence on renal dialysis: Secondary | ICD-10-CM | POA: Diagnosis not present

## 2023-01-08 DIAGNOSIS — Z992 Dependence on renal dialysis: Secondary | ICD-10-CM | POA: Diagnosis not present

## 2023-01-08 DIAGNOSIS — N186 End stage renal disease: Secondary | ICD-10-CM | POA: Diagnosis not present

## 2023-01-08 DIAGNOSIS — N2581 Secondary hyperparathyroidism of renal origin: Secondary | ICD-10-CM | POA: Diagnosis not present

## 2023-01-10 DIAGNOSIS — Z992 Dependence on renal dialysis: Secondary | ICD-10-CM | POA: Diagnosis not present

## 2023-01-10 DIAGNOSIS — N186 End stage renal disease: Secondary | ICD-10-CM | POA: Diagnosis not present

## 2023-01-10 DIAGNOSIS — N2581 Secondary hyperparathyroidism of renal origin: Secondary | ICD-10-CM | POA: Diagnosis not present

## 2023-01-12 DIAGNOSIS — N2581 Secondary hyperparathyroidism of renal origin: Secondary | ICD-10-CM | POA: Diagnosis not present

## 2023-01-12 DIAGNOSIS — Z992 Dependence on renal dialysis: Secondary | ICD-10-CM | POA: Diagnosis not present

## 2023-01-12 DIAGNOSIS — N186 End stage renal disease: Secondary | ICD-10-CM | POA: Diagnosis not present

## 2023-01-15 DIAGNOSIS — Z992 Dependence on renal dialysis: Secondary | ICD-10-CM | POA: Diagnosis not present

## 2023-01-15 DIAGNOSIS — N186 End stage renal disease: Secondary | ICD-10-CM | POA: Diagnosis not present

## 2023-01-15 DIAGNOSIS — N2581 Secondary hyperparathyroidism of renal origin: Secondary | ICD-10-CM | POA: Diagnosis not present

## 2023-01-17 DIAGNOSIS — N186 End stage renal disease: Secondary | ICD-10-CM | POA: Diagnosis not present

## 2023-01-17 DIAGNOSIS — Z992 Dependence on renal dialysis: Secondary | ICD-10-CM | POA: Diagnosis not present

## 2023-01-17 DIAGNOSIS — N2581 Secondary hyperparathyroidism of renal origin: Secondary | ICD-10-CM | POA: Diagnosis not present

## 2023-01-17 DIAGNOSIS — E1129 Type 2 diabetes mellitus with other diabetic kidney complication: Secondary | ICD-10-CM | POA: Diagnosis not present

## 2023-01-18 DIAGNOSIS — I5032 Chronic diastolic (congestive) heart failure: Secondary | ICD-10-CM | POA: Diagnosis not present

## 2023-01-18 DIAGNOSIS — E291 Testicular hypofunction: Secondary | ICD-10-CM | POA: Diagnosis not present

## 2023-01-18 DIAGNOSIS — E039 Hypothyroidism, unspecified: Secondary | ICD-10-CM | POA: Diagnosis not present

## 2023-01-18 DIAGNOSIS — E611 Iron deficiency: Secondary | ICD-10-CM | POA: Diagnosis not present

## 2023-01-18 DIAGNOSIS — M109 Gout, unspecified: Secondary | ICD-10-CM | POA: Diagnosis not present

## 2023-01-18 DIAGNOSIS — Z125 Encounter for screening for malignant neoplasm of prostate: Secondary | ICD-10-CM | POA: Diagnosis not present

## 2023-01-18 DIAGNOSIS — E785 Hyperlipidemia, unspecified: Secondary | ICD-10-CM | POA: Diagnosis not present

## 2023-01-18 DIAGNOSIS — D649 Anemia, unspecified: Secondary | ICD-10-CM | POA: Diagnosis not present

## 2023-01-19 DIAGNOSIS — N2581 Secondary hyperparathyroidism of renal origin: Secondary | ICD-10-CM | POA: Diagnosis not present

## 2023-01-19 DIAGNOSIS — Z992 Dependence on renal dialysis: Secondary | ICD-10-CM | POA: Diagnosis not present

## 2023-01-19 DIAGNOSIS — N186 End stage renal disease: Secondary | ICD-10-CM | POA: Diagnosis not present

## 2023-01-22 DIAGNOSIS — Z992 Dependence on renal dialysis: Secondary | ICD-10-CM | POA: Diagnosis not present

## 2023-01-22 DIAGNOSIS — N186 End stage renal disease: Secondary | ICD-10-CM | POA: Diagnosis not present

## 2023-01-22 DIAGNOSIS — N2581 Secondary hyperparathyroidism of renal origin: Secondary | ICD-10-CM | POA: Diagnosis not present

## 2023-01-23 DIAGNOSIS — Z1331 Encounter for screening for depression: Secondary | ICD-10-CM | POA: Diagnosis not present

## 2023-01-23 DIAGNOSIS — Z992 Dependence on renal dialysis: Secondary | ICD-10-CM | POA: Diagnosis not present

## 2023-01-23 DIAGNOSIS — I130 Hypertensive heart and chronic kidney disease with heart failure and stage 1 through stage 4 chronic kidney disease, or unspecified chronic kidney disease: Secondary | ICD-10-CM | POA: Diagnosis not present

## 2023-01-23 DIAGNOSIS — M5416 Radiculopathy, lumbar region: Secondary | ICD-10-CM | POA: Diagnosis not present

## 2023-01-23 DIAGNOSIS — N2581 Secondary hyperparathyroidism of renal origin: Secondary | ICD-10-CM | POA: Diagnosis not present

## 2023-01-23 DIAGNOSIS — E1129 Type 2 diabetes mellitus with other diabetic kidney complication: Secondary | ICD-10-CM | POA: Diagnosis not present

## 2023-01-23 DIAGNOSIS — Z1339 Encounter for screening examination for other mental health and behavioral disorders: Secondary | ICD-10-CM | POA: Diagnosis not present

## 2023-01-23 DIAGNOSIS — L942 Calcinosis cutis: Secondary | ICD-10-CM | POA: Diagnosis not present

## 2023-01-23 DIAGNOSIS — Z0000 Encounter for general adult medical examination without abnormal findings: Secondary | ICD-10-CM | POA: Diagnosis not present

## 2023-01-23 DIAGNOSIS — N186 End stage renal disease: Secondary | ICD-10-CM | POA: Diagnosis not present

## 2023-01-23 DIAGNOSIS — G894 Chronic pain syndrome: Secondary | ICD-10-CM | POA: Diagnosis not present

## 2023-01-23 DIAGNOSIS — I5032 Chronic diastolic (congestive) heart failure: Secondary | ICD-10-CM | POA: Diagnosis not present

## 2023-01-23 DIAGNOSIS — R82998 Other abnormal findings in urine: Secondary | ICD-10-CM | POA: Diagnosis not present

## 2023-01-24 DIAGNOSIS — Z992 Dependence on renal dialysis: Secondary | ICD-10-CM | POA: Diagnosis not present

## 2023-01-24 DIAGNOSIS — N186 End stage renal disease: Secondary | ICD-10-CM | POA: Diagnosis not present

## 2023-01-24 DIAGNOSIS — N2581 Secondary hyperparathyroidism of renal origin: Secondary | ICD-10-CM | POA: Diagnosis not present

## 2023-01-26 DIAGNOSIS — Z992 Dependence on renal dialysis: Secondary | ICD-10-CM | POA: Diagnosis not present

## 2023-01-26 DIAGNOSIS — N2581 Secondary hyperparathyroidism of renal origin: Secondary | ICD-10-CM | POA: Diagnosis not present

## 2023-01-26 DIAGNOSIS — N186 End stage renal disease: Secondary | ICD-10-CM | POA: Diagnosis not present

## 2023-01-29 DIAGNOSIS — N2581 Secondary hyperparathyroidism of renal origin: Secondary | ICD-10-CM | POA: Diagnosis not present

## 2023-01-29 DIAGNOSIS — Z992 Dependence on renal dialysis: Secondary | ICD-10-CM | POA: Diagnosis not present

## 2023-01-29 DIAGNOSIS — N186 End stage renal disease: Secondary | ICD-10-CM | POA: Diagnosis not present

## 2023-01-30 ENCOUNTER — Ambulatory Visit: Payer: Medicare HMO | Admitting: Plastic Surgery

## 2023-01-30 ENCOUNTER — Encounter: Payer: Self-pay | Admitting: Plastic Surgery

## 2023-01-30 VITALS — BP 114/64 | HR 86

## 2023-01-30 DIAGNOSIS — L942 Calcinosis cutis: Secondary | ICD-10-CM | POA: Diagnosis not present

## 2023-01-30 NOTE — Progress Notes (Signed)
Referring Provider Chilton Greathouse, MD [address] [place],  [place] [address]   CC:  Chief Complaint  Patient presents with   Consult      David Nguyen is an 82 y.o. male.  HPI: David Nguyen is an 82 year old male who is referred for evaluation of a lesion on the anterior portion of his left leg.  Initially the consult read left hip and I was not able to find the original consult request until after the patient had left the office.  The consult was for an area of calcinosis on the pretibial region of the left leg.  Allergies  Allergen Reactions   Penicillins Rash and Other (See Comments)    Childhood allergy       Outpatient Encounter Medications as of 01/30/2023  Medication Sig Note   ACCU-CHEK GUIDE test strip     acetaminophen (TYLENOL) 650 MG CR tablet Take 650 mg by mouth daily as needed for pain.     allopurinol (ZYLOPRIM) 100 MG tablet Take 200 mg by mouth daily.     Ascorbic Acid (VITAMIN C) 1000 MG tablet Take 1,000 mg by mouth daily.    aspirin EC 81 MG tablet Take 81 mg by mouth daily.    atorvastatin (LIPITOR) 40 MG tablet Take 40 mg by mouth at bedtime.     Blood Pressure Monitoring (OMRON 7 SERIES BP MONITOR) DEVI Chesk bp once a day   DX  I95.1, I10    Docusate Sodium (DSS) 100 MG CAPS Take 100 mg by mouth daily.    DROPLET PEN NEEDLES 31G X 8 MM MISC     fluticasone (FLONASE) 50 MCG/ACT nasal spray Place 2 sprays into both nostrils daily as needed for allergies.    Homeopathic Products [facility] ALLERGY EYE RELIEF) SOLN Place 1-2 drops into both eyes daily as needed (for dry eyes).    insulin NPH-regular Human (70-30) 100 UNIT/ML injection Inject 16-18 Units into the skin See admin instructions. Inject 18 units in the morning and 16 units at night 07/05/2020: Pt took 11 units at bedtime.   levothyroxine (SYNTHROID, LEVOTHROID) 75 MCG tablet Take 75 mcg by mouth daily before breakfast.    loratadine (CLARITIN) 10 MG tablet Take 10 mg by mouth daily as needed  for allergies.    montelukast (SINGULAIR) 10 MG tablet Take 10 mg by mouth daily.    Multiple Vitamin (MULTIVITAMIN WITH MINERALS) TABS tablet Take 1 tablet by mouth daily.    PRESCRIPTION MEDICATION Inhale into the lungs at bedtime. CPAP    Vitamin D3 (VITAMIN D) 25 MCG tablet Take 1,000 Units by mouth daily.    [DISCONTINUED] calcitRIOL (ROCALTROL) 0.25 MCG capsule Take 0.25 mcg by mouth daily.    [DISCONTINUED] carvedilol (COREG) 3.125 MG tablet Take 1 tablet (3.125 mg total) by mouth 2 (two) times daily with a meal.    No facility-administered encounter medications on file as of 01/30/2023.     Past Medical History:  Diagnosis Date   Allergic rhinitis    Anemia    low iron   Arthritis    left leg   Bradycardia 03/2019   Chronic kidney disease    Followed by Dr Darrick Penna     Diabetes mellitus    Type II   Horseshoe kidney    Hyperlipidemia    Hypertension    Hypothyroidism    Refusal of blood transfusions as patient is Jehovah's Witness    Scrotal edema    Sleep apnea  uses a cpap    Past Surgical History:  Procedure Laterality Date   A/V FISTULAGRAM Left 03/05/2020   Procedure: A/V FISTULAGRAM - Left Arm;  Surgeon: Chuck Hint, MD;  Location: [facility] INVASIVE CV LAB;  Service: Cardiovascular;  Laterality: Left;   ADRENALECTOMY  1993   left   AV FISTULA PLACEMENT Left 05/06/2018   Procedure: ARTERIOVENOUS (AV) FISTULA CREATION RADIOCEPHALIC;  Surgeon: Larina Earthly, MD;  Location: [facility] OR;  Service: Vascular;  Laterality: Left;   COLONOSCOPY W/ POLYPECTOMY     INGUINAL HERNIA REPAIR Right 07/05/2020   Procedure: HERNIA REPAIR INGUINAL ADULT;  Surgeon: Abigail Miyamoto, MD;  Location: [facility] OR;  Service: General;  Laterality: Right;  LMA   INSERTION OF MESH Right 07/05/2020   Procedure: INSERTION OF MESH;  Surgeon: Abigail Miyamoto, MD;  Location: [facility] OR;  Service: General;  Laterality: Right;   REVISION OF ARTERIOVENOUS GORETEX GRAFT Left 07/11/2018   Procedure:  TRANSPOSITION OF RADIOCEPHALIC  ARTERIOVENOUS FISTULA LEFT ARM;  Surgeon: Larina Earthly, MD;  Location: [facility] OR;  Service: Vascular;  Laterality: Left;   REVISION OF ARTERIOVENOUS GORETEX GRAFT Left 03/12/2020   Procedure: REVISION OF LEFT RADIOCEPHALIC FISTULA;  Surgeon: Larina Earthly, MD;  Location: [facility] OR;  Service: Vascular;  Laterality: Left;    Family History  Problem Relation Age of Onset   Prostate cancer Father    Kidney failure Mother    Lung cancer Sister     Social History   Social History Narrative   Not on file     Review of Systems General: Denies fevers, chills, weight loss CV: Denies chest pain, shortness of breath, palpitations Skin: The patient complains of pain and discomfort in the pretibial region of the left leg.  Physical Exam    01/30/2023    1:43 PM 08/16/2022    3:08 PM 06/14/2022   11:13 PM  Vitals with BMI  Height     Weight  195 lbs 13 oz 195 lbs  BMI  26.55 26.44  Systolic 114 108   Diastolic 64 60   Pulse 86 78     General:  No acute distress,  Alert and oriented, Non-Toxic, Normal speech and affect Integument: In the area of the patient's complaint there are multiple nodules that are consistent with calcinosis.   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  This was found after he left.  He will have a follow-up appointment with me in 2 weeks we will discuss these findings.  I do not recommend surgery for this until all medical management has been exhausted.  Even then I am hesitant to offer any type of surgical therapy for his issue as the symptoms are unlikely to heal.  Santiago Glad 01/30/2023, 3:50 PM

## 2023-01-31 DIAGNOSIS — N2581 Secondary hyperparathyroidism of renal origin: Secondary | ICD-10-CM | POA: Diagnosis not present

## 2023-01-31 DIAGNOSIS — N186 End stage renal disease: Secondary | ICD-10-CM | POA: Diagnosis not present

## 2023-01-31 DIAGNOSIS — Z992 Dependence on renal dialysis: Secondary | ICD-10-CM | POA: Diagnosis not present

## 2023-02-02 DIAGNOSIS — N2581 Secondary hyperparathyroidism of renal origin: Secondary | ICD-10-CM | POA: Diagnosis not present

## 2023-02-02 DIAGNOSIS — Z992 Dependence on renal dialysis: Secondary | ICD-10-CM | POA: Diagnosis not present

## 2023-02-02 DIAGNOSIS — N186 End stage renal disease: Secondary | ICD-10-CM | POA: Diagnosis not present

## 2023-02-05 DIAGNOSIS — N186 End stage renal disease: Secondary | ICD-10-CM | POA: Diagnosis not present

## 2023-02-05 DIAGNOSIS — N2581 Secondary hyperparathyroidism of renal origin: Secondary | ICD-10-CM | POA: Diagnosis not present

## 2023-02-05 DIAGNOSIS — Z992 Dependence on renal dialysis: Secondary | ICD-10-CM | POA: Diagnosis not present

## 2023-02-06 DIAGNOSIS — Z992 Dependence on renal dialysis: Secondary | ICD-10-CM | POA: Diagnosis not present

## 2023-02-06 DIAGNOSIS — E1122 Type 2 diabetes mellitus with diabetic chronic kidney disease: Secondary | ICD-10-CM | POA: Diagnosis not present

## 2023-02-06 DIAGNOSIS — N186 End stage renal disease: Secondary | ICD-10-CM | POA: Diagnosis not present

## 2023-02-07 DIAGNOSIS — N2581 Secondary hyperparathyroidism of renal origin: Secondary | ICD-10-CM | POA: Diagnosis not present

## 2023-02-07 DIAGNOSIS — Z992 Dependence on renal dialysis: Secondary | ICD-10-CM | POA: Diagnosis not present

## 2023-02-07 DIAGNOSIS — N186 End stage renal disease: Secondary | ICD-10-CM | POA: Diagnosis not present

## 2023-02-09 DIAGNOSIS — Z992 Dependence on renal dialysis: Secondary | ICD-10-CM | POA: Diagnosis not present

## 2023-02-09 DIAGNOSIS — N186 End stage renal disease: Secondary | ICD-10-CM | POA: Diagnosis not present

## 2023-02-09 DIAGNOSIS — N2581 Secondary hyperparathyroidism of renal origin: Secondary | ICD-10-CM | POA: Diagnosis not present

## 2023-02-12 DIAGNOSIS — N2581 Secondary hyperparathyroidism of renal origin: Secondary | ICD-10-CM | POA: Diagnosis not present

## 2023-02-12 DIAGNOSIS — Z992 Dependence on renal dialysis: Secondary | ICD-10-CM | POA: Diagnosis not present

## 2023-02-12 DIAGNOSIS — N186 End stage renal disease: Secondary | ICD-10-CM | POA: Diagnosis not present

## 2023-02-14 DIAGNOSIS — Z992 Dependence on renal dialysis: Secondary | ICD-10-CM | POA: Diagnosis not present

## 2023-02-14 DIAGNOSIS — N2581 Secondary hyperparathyroidism of renal origin: Secondary | ICD-10-CM | POA: Diagnosis not present

## 2023-02-14 DIAGNOSIS — N186 End stage renal disease: Secondary | ICD-10-CM | POA: Diagnosis not present

## 2023-02-16 DIAGNOSIS — N2581 Secondary hyperparathyroidism of renal origin: Secondary | ICD-10-CM | POA: Diagnosis not present

## 2023-02-16 DIAGNOSIS — Z992 Dependence on renal dialysis: Secondary | ICD-10-CM | POA: Diagnosis not present

## 2023-02-16 DIAGNOSIS — N186 End stage renal disease: Secondary | ICD-10-CM | POA: Diagnosis not present

## 2023-02-17 DIAGNOSIS — E1129 Type 2 diabetes mellitus with other diabetic kidney complication: Secondary | ICD-10-CM | POA: Diagnosis not present

## 2023-02-19 DIAGNOSIS — N186 End stage renal disease: Secondary | ICD-10-CM | POA: Diagnosis not present

## 2023-02-19 DIAGNOSIS — Z992 Dependence on renal dialysis: Secondary | ICD-10-CM | POA: Diagnosis not present

## 2023-02-19 DIAGNOSIS — N2581 Secondary hyperparathyroidism of renal origin: Secondary | ICD-10-CM | POA: Diagnosis not present

## 2023-02-21 DIAGNOSIS — N186 End stage renal disease: Secondary | ICD-10-CM | POA: Diagnosis not present

## 2023-02-21 DIAGNOSIS — Z992 Dependence on renal dialysis: Secondary | ICD-10-CM | POA: Diagnosis not present

## 2023-02-21 DIAGNOSIS — N2581 Secondary hyperparathyroidism of renal origin: Secondary | ICD-10-CM | POA: Diagnosis not present

## 2023-02-23 DIAGNOSIS — N186 End stage renal disease: Secondary | ICD-10-CM | POA: Diagnosis not present

## 2023-02-23 DIAGNOSIS — Z992 Dependence on renal dialysis: Secondary | ICD-10-CM | POA: Diagnosis not present

## 2023-02-23 DIAGNOSIS — N2581 Secondary hyperparathyroidism of renal origin: Secondary | ICD-10-CM | POA: Diagnosis not present

## 2023-02-26 ENCOUNTER — Telehealth: Payer: Self-pay | Admitting: Plastic Surgery

## 2023-02-26 DIAGNOSIS — N186 End stage renal disease: Secondary | ICD-10-CM | POA: Diagnosis not present

## 2023-02-26 DIAGNOSIS — N2581 Secondary hyperparathyroidism of renal origin: Secondary | ICD-10-CM | POA: Diagnosis not present

## 2023-02-26 DIAGNOSIS — Z992 Dependence on renal dialysis: Secondary | ICD-10-CM | POA: Diagnosis not present

## 2023-02-26 NOTE — Telephone Encounter (Signed)
Crystal with [facility] called to follow up about appointment from 01/30/23 with Dr Ladona Ridgel. There was some questions regarding notes from visit. She would like a call back at [phone] for nurse line

## 2023-02-26 NOTE — Telephone Encounter (Signed)
Path results from [facility] received showing Dx of Osteoma Cutis. Copy to Dr. Ladona Ridgel and one to batch scan.

## 2023-02-28 DIAGNOSIS — N2581 Secondary hyperparathyroidism of renal origin: Secondary | ICD-10-CM | POA: Diagnosis not present

## 2023-02-28 DIAGNOSIS — N186 End stage renal disease: Secondary | ICD-10-CM | POA: Diagnosis not present

## 2023-02-28 DIAGNOSIS — Z992 Dependence on renal dialysis: Secondary | ICD-10-CM | POA: Diagnosis not present

## 2023-03-02 DIAGNOSIS — N186 End stage renal disease: Secondary | ICD-10-CM | POA: Diagnosis not present

## 2023-03-02 DIAGNOSIS — N2581 Secondary hyperparathyroidism of renal origin: Secondary | ICD-10-CM | POA: Diagnosis not present

## 2023-03-02 DIAGNOSIS — Z992 Dependence on renal dialysis: Secondary | ICD-10-CM | POA: Diagnosis not present

## 2023-03-05 DIAGNOSIS — N2581 Secondary hyperparathyroidism of renal origin: Secondary | ICD-10-CM | POA: Diagnosis not present

## 2023-03-05 DIAGNOSIS — Z992 Dependence on renal dialysis: Secondary | ICD-10-CM | POA: Diagnosis not present

## 2023-03-05 DIAGNOSIS — N186 End stage renal disease: Secondary | ICD-10-CM | POA: Diagnosis not present

## 2023-03-06 ENCOUNTER — Ambulatory Visit: Payer: Medicare HMO | Admitting: Plastic Surgery

## 2023-03-07 DIAGNOSIS — Z992 Dependence on renal dialysis: Secondary | ICD-10-CM | POA: Diagnosis not present

## 2023-03-07 DIAGNOSIS — N186 End stage renal disease: Secondary | ICD-10-CM | POA: Diagnosis not present

## 2023-03-07 DIAGNOSIS — N2581 Secondary hyperparathyroidism of renal origin: Secondary | ICD-10-CM | POA: Diagnosis not present

## 2023-03-09 DIAGNOSIS — E1122 Type 2 diabetes mellitus with diabetic chronic kidney disease: Secondary | ICD-10-CM | POA: Diagnosis not present

## 2023-03-09 DIAGNOSIS — N2581 Secondary hyperparathyroidism of renal origin: Secondary | ICD-10-CM | POA: Diagnosis not present

## 2023-03-09 DIAGNOSIS — Z992 Dependence on renal dialysis: Secondary | ICD-10-CM | POA: Diagnosis not present

## 2023-03-09 DIAGNOSIS — N186 End stage renal disease: Secondary | ICD-10-CM | POA: Diagnosis not present

## 2023-03-12 DIAGNOSIS — N2581 Secondary hyperparathyroidism of renal origin: Secondary | ICD-10-CM | POA: Diagnosis not present

## 2023-03-12 DIAGNOSIS — N186 End stage renal disease: Secondary | ICD-10-CM | POA: Diagnosis not present

## 2023-03-12 DIAGNOSIS — Z992 Dependence on renal dialysis: Secondary | ICD-10-CM | POA: Diagnosis not present

## 2023-03-13 NOTE — Telephone Encounter (Signed)
Follow-up call received from [facility] at [facility] regarding David Nguyen plan of care. Advised I had given Dr. Ladona Ridgel the path results and he was aware of the correct diagnosis. Also that David Nguyen had a f/u appt scheduled with Dr. Ladona Ridgel on 5/28 which he missed. Advised Dr. Lubertha Basque last note stated "I do not recommend surgery for this until all medical management has been exhausted. Even then I am hesitant to offer any type of surgical therapy for his issue as the symptoms are unlikely to heal.". We will reach out to David Nguyen and see if he wishes to reschedule the appointment that was missed. Crystal stated she will update their records.

## 2023-03-14 DIAGNOSIS — N186 End stage renal disease: Secondary | ICD-10-CM | POA: Diagnosis not present

## 2023-03-14 DIAGNOSIS — Z992 Dependence on renal dialysis: Secondary | ICD-10-CM | POA: Diagnosis not present

## 2023-03-14 DIAGNOSIS — N2581 Secondary hyperparathyroidism of renal origin: Secondary | ICD-10-CM | POA: Diagnosis not present

## 2023-03-15 DIAGNOSIS — Z992 Dependence on renal dialysis: Secondary | ICD-10-CM | POA: Diagnosis not present

## 2023-03-15 DIAGNOSIS — E1129 Type 2 diabetes mellitus with other diabetic kidney complication: Secondary | ICD-10-CM | POA: Diagnosis not present

## 2023-03-15 DIAGNOSIS — N186 End stage renal disease: Secondary | ICD-10-CM | POA: Diagnosis not present

## 2023-03-16 DIAGNOSIS — N186 End stage renal disease: Secondary | ICD-10-CM | POA: Diagnosis not present

## 2023-03-16 DIAGNOSIS — N2581 Secondary hyperparathyroidism of renal origin: Secondary | ICD-10-CM | POA: Diagnosis not present

## 2023-03-16 DIAGNOSIS — Z992 Dependence on renal dialysis: Secondary | ICD-10-CM | POA: Diagnosis not present

## 2023-03-19 DIAGNOSIS — N2581 Secondary hyperparathyroidism of renal origin: Secondary | ICD-10-CM | POA: Diagnosis not present

## 2023-03-19 DIAGNOSIS — N186 End stage renal disease: Secondary | ICD-10-CM | POA: Diagnosis not present

## 2023-03-19 DIAGNOSIS — Z992 Dependence on renal dialysis: Secondary | ICD-10-CM | POA: Diagnosis not present

## 2023-03-20 DIAGNOSIS — E1129 Type 2 diabetes mellitus with other diabetic kidney complication: Secondary | ICD-10-CM | POA: Diagnosis not present

## 2023-03-21 DIAGNOSIS — Z992 Dependence on renal dialysis: Secondary | ICD-10-CM | POA: Diagnosis not present

## 2023-03-21 DIAGNOSIS — N186 End stage renal disease: Secondary | ICD-10-CM | POA: Diagnosis not present

## 2023-03-21 DIAGNOSIS — N2581 Secondary hyperparathyroidism of renal origin: Secondary | ICD-10-CM | POA: Diagnosis not present

## 2023-03-22 ENCOUNTER — Telehealth: Payer: Self-pay | Admitting: Plastic Surgery

## 2023-03-22 NOTE — Telephone Encounter (Signed)
Spoke with [place], she did not need a return call.

## 2023-03-22 NOTE — Telephone Encounter (Signed)
Pt saw Dr. Ladona Ridgel on 01/30/23 and was a No Show for an appt on 03/06/23. Pt was referred to [place] from [facility].  Beverly from [facility] called and wanted to know if anyone has reached out to the pt to reschedule this appt.  I read her the telephone note from Joss on 03/13/23.  She said they are wanting to make a final decision about surgery.  Please call her at [phone].

## 2023-03-23 DIAGNOSIS — Z992 Dependence on renal dialysis: Secondary | ICD-10-CM | POA: Diagnosis not present

## 2023-03-23 DIAGNOSIS — N2581 Secondary hyperparathyroidism of renal origin: Secondary | ICD-10-CM | POA: Diagnosis not present

## 2023-03-23 DIAGNOSIS — N186 End stage renal disease: Secondary | ICD-10-CM | POA: Diagnosis not present

## 2023-03-26 DIAGNOSIS — N2581 Secondary hyperparathyroidism of renal origin: Secondary | ICD-10-CM | POA: Diagnosis not present

## 2023-03-26 DIAGNOSIS — N186 End stage renal disease: Secondary | ICD-10-CM | POA: Diagnosis not present

## 2023-03-26 DIAGNOSIS — Z992 Dependence on renal dialysis: Secondary | ICD-10-CM | POA: Diagnosis not present

## 2023-03-28 DIAGNOSIS — Z992 Dependence on renal dialysis: Secondary | ICD-10-CM | POA: Diagnosis not present

## 2023-03-28 DIAGNOSIS — N186 End stage renal disease: Secondary | ICD-10-CM | POA: Diagnosis not present

## 2023-03-28 DIAGNOSIS — N2581 Secondary hyperparathyroidism of renal origin: Secondary | ICD-10-CM | POA: Diagnosis not present

## 2023-03-30 DIAGNOSIS — N186 End stage renal disease: Secondary | ICD-10-CM | POA: Diagnosis not present

## 2023-03-30 DIAGNOSIS — N2581 Secondary hyperparathyroidism of renal origin: Secondary | ICD-10-CM | POA: Diagnosis not present

## 2023-03-30 DIAGNOSIS — Z992 Dependence on renal dialysis: Secondary | ICD-10-CM | POA: Diagnosis not present

## 2023-04-02 DIAGNOSIS — N2581 Secondary hyperparathyroidism of renal origin: Secondary | ICD-10-CM | POA: Diagnosis not present

## 2023-04-02 DIAGNOSIS — Z992 Dependence on renal dialysis: Secondary | ICD-10-CM | POA: Diagnosis not present

## 2023-04-02 DIAGNOSIS — N186 End stage renal disease: Secondary | ICD-10-CM | POA: Diagnosis not present

## 2023-04-03 DIAGNOSIS — E039 Hypothyroidism, unspecified: Secondary | ICD-10-CM | POA: Diagnosis not present

## 2023-04-03 DIAGNOSIS — Z992 Dependence on renal dialysis: Secondary | ICD-10-CM | POA: Diagnosis not present

## 2023-04-03 DIAGNOSIS — K5900 Constipation, unspecified: Secondary | ICD-10-CM | POA: Diagnosis not present

## 2023-04-03 DIAGNOSIS — E1129 Type 2 diabetes mellitus with other diabetic kidney complication: Secondary | ICD-10-CM | POA: Diagnosis not present

## 2023-04-03 DIAGNOSIS — K921 Melena: Secondary | ICD-10-CM | POA: Diagnosis not present

## 2023-04-03 DIAGNOSIS — N186 End stage renal disease: Secondary | ICD-10-CM | POA: Diagnosis not present

## 2023-04-03 DIAGNOSIS — I130 Hypertensive heart and chronic kidney disease with heart failure and stage 1 through stage 4 chronic kidney disease, or unspecified chronic kidney disease: Secondary | ICD-10-CM | POA: Diagnosis not present

## 2023-04-03 DIAGNOSIS — I5032 Chronic diastolic (congestive) heart failure: Secondary | ICD-10-CM | POA: Diagnosis not present

## 2023-04-04 ENCOUNTER — Ambulatory Visit: Payer: Medicare HMO | Admitting: Podiatry

## 2023-04-04 DIAGNOSIS — N186 End stage renal disease: Secondary | ICD-10-CM | POA: Diagnosis not present

## 2023-04-04 DIAGNOSIS — Z992 Dependence on renal dialysis: Secondary | ICD-10-CM | POA: Diagnosis not present

## 2023-04-04 DIAGNOSIS — N2581 Secondary hyperparathyroidism of renal origin: Secondary | ICD-10-CM | POA: Diagnosis not present

## 2023-04-05 ENCOUNTER — Encounter: Payer: Self-pay | Admitting: Podiatry

## 2023-04-05 ENCOUNTER — Ambulatory Visit (INDEPENDENT_AMBULATORY_CARE_PROVIDER_SITE_OTHER): Payer: Medicare HMO | Admitting: Podiatry

## 2023-04-05 DIAGNOSIS — K5900 Constipation, unspecified: Secondary | ICD-10-CM | POA: Diagnosis not present

## 2023-04-05 DIAGNOSIS — B351 Tinea unguium: Secondary | ICD-10-CM

## 2023-04-05 DIAGNOSIS — E118 Type 2 diabetes mellitus with unspecified complications: Secondary | ICD-10-CM

## 2023-04-05 DIAGNOSIS — M79674 Pain in right toe(s): Secondary | ICD-10-CM

## 2023-04-05 DIAGNOSIS — M79675 Pain in left toe(s): Secondary | ICD-10-CM | POA: Diagnosis not present

## 2023-04-05 DIAGNOSIS — K921 Melena: Secondary | ICD-10-CM | POA: Diagnosis not present

## 2023-04-05 NOTE — Progress Notes (Signed)
This patient returns to my office for at risk foot care.  This patient requires this care by a professional since this patient will be at risk due to having CKD, and type 2 diabetes  This patient is unable to cut nails himself since the patient cannot reach his nails.These nails are painful walking and wearing shoes.  This patient presents for at risk foot care today.  General Appearance  Alert, conversant and in no acute stress.  Vascular  Dorsalis pedis and posterior tibial  pulses are weakly palpable  bilaterally.  Capillary return is within normal limits  bilaterally. Temperature is within normal limits  bilaterally.  Neurologic  Senn-Weinstein monofilament wire test within normal limits  bilaterally. Muscle power within normal limits bilaterally.  Nails Thick disfigured discolored nails with subungual debris  from hallux to fifth toes bilaterally. No evidence of bacterial infection or drainage bilaterally.  Orthopedic  No limitations of motion  feet .  No crepitus or effusions noted.  No bony pathology or digital deformities noted. HAV  B/L.  Skin  normotropic skin with no porokeratosis noted bilaterally.  No signs of infections or ulcers noted.     Onychomycosis  Pain in right toes  Pain in left toes  Consent was obtained for treatment procedures.   Mechanical debridement of nails 1-5  bilaterally performed with a nail nipper.  Filed with dremel without incident.    Return office visit    3 months                 Told patient to return for periodic foot care and evaluation due to potential at risk complications.   Malu Pellegrini DPM  

## 2023-04-06 DIAGNOSIS — N2581 Secondary hyperparathyroidism of renal origin: Secondary | ICD-10-CM | POA: Diagnosis not present

## 2023-04-06 DIAGNOSIS — N186 End stage renal disease: Secondary | ICD-10-CM | POA: Diagnosis not present

## 2023-04-06 DIAGNOSIS — Z992 Dependence on renal dialysis: Secondary | ICD-10-CM | POA: Diagnosis not present

## 2023-04-08 DIAGNOSIS — N186 End stage renal disease: Secondary | ICD-10-CM | POA: Diagnosis not present

## 2023-04-08 DIAGNOSIS — E1122 Type 2 diabetes mellitus with diabetic chronic kidney disease: Secondary | ICD-10-CM | POA: Diagnosis not present

## 2023-04-08 DIAGNOSIS — Z992 Dependence on renal dialysis: Secondary | ICD-10-CM | POA: Diagnosis not present

## 2023-04-09 DIAGNOSIS — Z992 Dependence on renal dialysis: Secondary | ICD-10-CM | POA: Diagnosis not present

## 2023-04-09 DIAGNOSIS — N2581 Secondary hyperparathyroidism of renal origin: Secondary | ICD-10-CM | POA: Diagnosis not present

## 2023-04-09 DIAGNOSIS — N186 End stage renal disease: Secondary | ICD-10-CM | POA: Diagnosis not present

## 2023-04-11 DIAGNOSIS — N2581 Secondary hyperparathyroidism of renal origin: Secondary | ICD-10-CM | POA: Diagnosis not present

## 2023-04-11 DIAGNOSIS — N186 End stage renal disease: Secondary | ICD-10-CM | POA: Diagnosis not present

## 2023-04-11 DIAGNOSIS — Z992 Dependence on renal dialysis: Secondary | ICD-10-CM | POA: Diagnosis not present

## 2023-04-13 DIAGNOSIS — N2581 Secondary hyperparathyroidism of renal origin: Secondary | ICD-10-CM | POA: Diagnosis not present

## 2023-04-13 DIAGNOSIS — N186 End stage renal disease: Secondary | ICD-10-CM | POA: Diagnosis not present

## 2023-04-13 DIAGNOSIS — Z992 Dependence on renal dialysis: Secondary | ICD-10-CM | POA: Diagnosis not present

## 2023-04-16 DIAGNOSIS — Z992 Dependence on renal dialysis: Secondary | ICD-10-CM | POA: Diagnosis not present

## 2023-04-16 DIAGNOSIS — N186 End stage renal disease: Secondary | ICD-10-CM | POA: Diagnosis not present

## 2023-04-16 DIAGNOSIS — N2581 Secondary hyperparathyroidism of renal origin: Secondary | ICD-10-CM | POA: Diagnosis not present

## 2023-04-18 DIAGNOSIS — N2581 Secondary hyperparathyroidism of renal origin: Secondary | ICD-10-CM | POA: Diagnosis not present

## 2023-04-18 DIAGNOSIS — N186 End stage renal disease: Secondary | ICD-10-CM | POA: Diagnosis not present

## 2023-04-18 DIAGNOSIS — Z992 Dependence on renal dialysis: Secondary | ICD-10-CM | POA: Diagnosis not present

## 2023-04-20 DIAGNOSIS — Z992 Dependence on renal dialysis: Secondary | ICD-10-CM | POA: Diagnosis not present

## 2023-04-20 DIAGNOSIS — E1129 Type 2 diabetes mellitus with other diabetic kidney complication: Secondary | ICD-10-CM | POA: Diagnosis not present

## 2023-04-20 DIAGNOSIS — N186 End stage renal disease: Secondary | ICD-10-CM | POA: Diagnosis not present

## 2023-04-20 DIAGNOSIS — N2581 Secondary hyperparathyroidism of renal origin: Secondary | ICD-10-CM | POA: Diagnosis not present

## 2023-04-23 DIAGNOSIS — N2581 Secondary hyperparathyroidism of renal origin: Secondary | ICD-10-CM | POA: Diagnosis not present

## 2023-04-23 DIAGNOSIS — Z992 Dependence on renal dialysis: Secondary | ICD-10-CM | POA: Diagnosis not present

## 2023-04-23 DIAGNOSIS — N186 End stage renal disease: Secondary | ICD-10-CM | POA: Diagnosis not present

## 2023-04-25 DIAGNOSIS — N2581 Secondary hyperparathyroidism of renal origin: Secondary | ICD-10-CM | POA: Diagnosis not present

## 2023-04-25 DIAGNOSIS — N186 End stage renal disease: Secondary | ICD-10-CM | POA: Diagnosis not present

## 2023-04-25 DIAGNOSIS — Z992 Dependence on renal dialysis: Secondary | ICD-10-CM | POA: Diagnosis not present

## 2023-04-26 ENCOUNTER — Ambulatory Visit: Payer: Medicare HMO | Admitting: Plastic Surgery

## 2023-04-26 ENCOUNTER — Encounter: Payer: Self-pay | Admitting: Plastic Surgery

## 2023-04-26 VITALS — BP 130/69 | HR 84 | Ht 72.0 in | Wt 190.2 lb

## 2023-04-26 DIAGNOSIS — L988 Other specified disorders of the skin and subcutaneous tissue: Secondary | ICD-10-CM

## 2023-04-26 NOTE — Progress Notes (Signed)
David Nguyen is an 82 year old male who is referred to me for evaluation of subcutaneous nodules on the left leg in the pretibial region.  These nodules have been biopsied and are consistent with osteoma cutis.  Today he presents with the nodules unchanged.  It is unclear how much discomfort he is having from the nodules.  I had a long discussion with him and his daughter today regarding management of them.  This is a completely benign disease process there is almost certainly related to his end-stage renal disease.  The primary treatment for the nodules is surgical excision.  I am extremely hesitant to proceed with surgery as I do not believe that he will heal these wounds once they have been made.  I have asked him to consider carefully how much discomfort he is having as discomfort is the only reason to proceed with any type of surgical therapy.  If the discomfort can be managed with Tylenol then this would be preferable.  If he feels that the pain is too great for him to manage then we will proceed with surgical excision of the lesions with the understanding that he likely will end up with a nonhealing wound on the anterior portion of his leg.  He understands and will return to let me know how he would like to proceed.

## 2023-04-27 DIAGNOSIS — N186 End stage renal disease: Secondary | ICD-10-CM | POA: Diagnosis not present

## 2023-04-27 DIAGNOSIS — Z992 Dependence on renal dialysis: Secondary | ICD-10-CM | POA: Diagnosis not present

## 2023-04-27 DIAGNOSIS — N2581 Secondary hyperparathyroidism of renal origin: Secondary | ICD-10-CM | POA: Diagnosis not present

## 2023-04-30 DIAGNOSIS — N2581 Secondary hyperparathyroidism of renal origin: Secondary | ICD-10-CM | POA: Diagnosis not present

## 2023-04-30 DIAGNOSIS — N186 End stage renal disease: Secondary | ICD-10-CM | POA: Diagnosis not present

## 2023-04-30 DIAGNOSIS — Z992 Dependence on renal dialysis: Secondary | ICD-10-CM | POA: Diagnosis not present

## 2023-05-02 DIAGNOSIS — N186 End stage renal disease: Secondary | ICD-10-CM | POA: Diagnosis not present

## 2023-05-02 DIAGNOSIS — N2581 Secondary hyperparathyroidism of renal origin: Secondary | ICD-10-CM | POA: Diagnosis not present

## 2023-05-02 DIAGNOSIS — Z992 Dependence on renal dialysis: Secondary | ICD-10-CM | POA: Diagnosis not present

## 2023-05-04 DIAGNOSIS — Z992 Dependence on renal dialysis: Secondary | ICD-10-CM | POA: Diagnosis not present

## 2023-05-04 DIAGNOSIS — N186 End stage renal disease: Secondary | ICD-10-CM | POA: Diagnosis not present

## 2023-05-04 DIAGNOSIS — N2581 Secondary hyperparathyroidism of renal origin: Secondary | ICD-10-CM | POA: Diagnosis not present

## 2023-05-07 DIAGNOSIS — N2581 Secondary hyperparathyroidism of renal origin: Secondary | ICD-10-CM | POA: Diagnosis not present

## 2023-05-07 DIAGNOSIS — N186 End stage renal disease: Secondary | ICD-10-CM | POA: Diagnosis not present

## 2023-05-07 DIAGNOSIS — Z992 Dependence on renal dialysis: Secondary | ICD-10-CM | POA: Diagnosis not present

## 2023-05-09 DIAGNOSIS — E1122 Type 2 diabetes mellitus with diabetic chronic kidney disease: Secondary | ICD-10-CM | POA: Diagnosis not present

## 2023-05-09 DIAGNOSIS — Z992 Dependence on renal dialysis: Secondary | ICD-10-CM | POA: Diagnosis not present

## 2023-05-09 DIAGNOSIS — N186 End stage renal disease: Secondary | ICD-10-CM | POA: Diagnosis not present

## 2023-05-09 DIAGNOSIS — N2581 Secondary hyperparathyroidism of renal origin: Secondary | ICD-10-CM | POA: Diagnosis not present

## 2023-05-11 DIAGNOSIS — N186 End stage renal disease: Secondary | ICD-10-CM | POA: Diagnosis not present

## 2023-05-11 DIAGNOSIS — N2581 Secondary hyperparathyroidism of renal origin: Secondary | ICD-10-CM | POA: Diagnosis not present

## 2023-05-11 DIAGNOSIS — Z992 Dependence on renal dialysis: Secondary | ICD-10-CM | POA: Diagnosis not present

## 2023-05-14 DIAGNOSIS — N186 End stage renal disease: Secondary | ICD-10-CM | POA: Diagnosis not present

## 2023-05-14 DIAGNOSIS — Z992 Dependence on renal dialysis: Secondary | ICD-10-CM | POA: Diagnosis not present

## 2023-05-14 DIAGNOSIS — N2581 Secondary hyperparathyroidism of renal origin: Secondary | ICD-10-CM | POA: Diagnosis not present

## 2023-05-16 DIAGNOSIS — N2581 Secondary hyperparathyroidism of renal origin: Secondary | ICD-10-CM | POA: Diagnosis not present

## 2023-05-16 DIAGNOSIS — Z992 Dependence on renal dialysis: Secondary | ICD-10-CM | POA: Diagnosis not present

## 2023-05-16 DIAGNOSIS — N186 End stage renal disease: Secondary | ICD-10-CM | POA: Diagnosis not present

## 2023-05-18 DIAGNOSIS — N2581 Secondary hyperparathyroidism of renal origin: Secondary | ICD-10-CM | POA: Diagnosis not present

## 2023-05-18 DIAGNOSIS — Z992 Dependence on renal dialysis: Secondary | ICD-10-CM | POA: Diagnosis not present

## 2023-05-18 DIAGNOSIS — N186 End stage renal disease: Secondary | ICD-10-CM | POA: Diagnosis not present

## 2023-05-21 DIAGNOSIS — E1129 Type 2 diabetes mellitus with other diabetic kidney complication: Secondary | ICD-10-CM | POA: Diagnosis not present

## 2023-05-21 DIAGNOSIS — N186 End stage renal disease: Secondary | ICD-10-CM | POA: Diagnosis not present

## 2023-05-21 DIAGNOSIS — Z992 Dependence on renal dialysis: Secondary | ICD-10-CM | POA: Diagnosis not present

## 2023-05-21 DIAGNOSIS — N2581 Secondary hyperparathyroidism of renal origin: Secondary | ICD-10-CM | POA: Diagnosis not present

## 2023-05-23 DIAGNOSIS — N2581 Secondary hyperparathyroidism of renal origin: Secondary | ICD-10-CM | POA: Diagnosis not present

## 2023-05-23 DIAGNOSIS — Z992 Dependence on renal dialysis: Secondary | ICD-10-CM | POA: Diagnosis not present

## 2023-05-23 DIAGNOSIS — N186 End stage renal disease: Secondary | ICD-10-CM | POA: Diagnosis not present

## 2023-05-25 DIAGNOSIS — N186 End stage renal disease: Secondary | ICD-10-CM | POA: Diagnosis not present

## 2023-05-25 DIAGNOSIS — N2581 Secondary hyperparathyroidism of renal origin: Secondary | ICD-10-CM | POA: Diagnosis not present

## 2023-05-25 DIAGNOSIS — Z992 Dependence on renal dialysis: Secondary | ICD-10-CM | POA: Diagnosis not present

## 2023-05-28 DIAGNOSIS — N186 End stage renal disease: Secondary | ICD-10-CM | POA: Diagnosis not present

## 2023-05-28 DIAGNOSIS — Z992 Dependence on renal dialysis: Secondary | ICD-10-CM | POA: Diagnosis not present

## 2023-05-28 DIAGNOSIS — N2581 Secondary hyperparathyroidism of renal origin: Secondary | ICD-10-CM | POA: Diagnosis not present

## 2023-05-30 DIAGNOSIS — N186 End stage renal disease: Secondary | ICD-10-CM | POA: Diagnosis not present

## 2023-05-30 DIAGNOSIS — N2581 Secondary hyperparathyroidism of renal origin: Secondary | ICD-10-CM | POA: Diagnosis not present

## 2023-05-30 DIAGNOSIS — Z992 Dependence on renal dialysis: Secondary | ICD-10-CM | POA: Diagnosis not present

## 2023-06-01 DIAGNOSIS — N186 End stage renal disease: Secondary | ICD-10-CM | POA: Diagnosis not present

## 2023-06-01 DIAGNOSIS — Z992 Dependence on renal dialysis: Secondary | ICD-10-CM | POA: Diagnosis not present

## 2023-06-01 DIAGNOSIS — N2581 Secondary hyperparathyroidism of renal origin: Secondary | ICD-10-CM | POA: Diagnosis not present

## 2023-06-04 DIAGNOSIS — Z992 Dependence on renal dialysis: Secondary | ICD-10-CM | POA: Diagnosis not present

## 2023-06-04 DIAGNOSIS — N186 End stage renal disease: Secondary | ICD-10-CM | POA: Diagnosis not present

## 2023-06-04 DIAGNOSIS — N2581 Secondary hyperparathyroidism of renal origin: Secondary | ICD-10-CM | POA: Diagnosis not present

## 2023-06-06 DIAGNOSIS — N2581 Secondary hyperparathyroidism of renal origin: Secondary | ICD-10-CM | POA: Diagnosis not present

## 2023-06-06 DIAGNOSIS — N186 End stage renal disease: Secondary | ICD-10-CM | POA: Diagnosis not present

## 2023-06-06 DIAGNOSIS — Z992 Dependence on renal dialysis: Secondary | ICD-10-CM | POA: Diagnosis not present

## 2023-06-08 DIAGNOSIS — N2581 Secondary hyperparathyroidism of renal origin: Secondary | ICD-10-CM | POA: Diagnosis not present

## 2023-06-08 DIAGNOSIS — Z992 Dependence on renal dialysis: Secondary | ICD-10-CM | POA: Diagnosis not present

## 2023-06-08 DIAGNOSIS — N186 End stage renal disease: Secondary | ICD-10-CM | POA: Diagnosis not present

## 2023-06-09 DIAGNOSIS — N186 End stage renal disease: Secondary | ICD-10-CM | POA: Diagnosis not present

## 2023-06-09 DIAGNOSIS — E1122 Type 2 diabetes mellitus with diabetic chronic kidney disease: Secondary | ICD-10-CM | POA: Diagnosis not present

## 2023-06-09 DIAGNOSIS — Z992 Dependence on renal dialysis: Secondary | ICD-10-CM | POA: Diagnosis not present

## 2023-06-11 DIAGNOSIS — N186 End stage renal disease: Secondary | ICD-10-CM | POA: Diagnosis not present

## 2023-06-11 DIAGNOSIS — Z992 Dependence on renal dialysis: Secondary | ICD-10-CM | POA: Diagnosis not present

## 2023-06-11 DIAGNOSIS — N2581 Secondary hyperparathyroidism of renal origin: Secondary | ICD-10-CM | POA: Diagnosis not present

## 2023-06-13 DIAGNOSIS — N2581 Secondary hyperparathyroidism of renal origin: Secondary | ICD-10-CM | POA: Diagnosis not present

## 2023-06-13 DIAGNOSIS — Z992 Dependence on renal dialysis: Secondary | ICD-10-CM | POA: Diagnosis not present

## 2023-06-13 DIAGNOSIS — N186 End stage renal disease: Secondary | ICD-10-CM | POA: Diagnosis not present

## 2023-06-15 DIAGNOSIS — Z992 Dependence on renal dialysis: Secondary | ICD-10-CM | POA: Diagnosis not present

## 2023-06-15 DIAGNOSIS — N186 End stage renal disease: Secondary | ICD-10-CM | POA: Diagnosis not present

## 2023-06-15 DIAGNOSIS — N2581 Secondary hyperparathyroidism of renal origin: Secondary | ICD-10-CM | POA: Diagnosis not present

## 2023-06-18 DIAGNOSIS — N186 End stage renal disease: Secondary | ICD-10-CM | POA: Diagnosis not present

## 2023-06-18 DIAGNOSIS — N2581 Secondary hyperparathyroidism of renal origin: Secondary | ICD-10-CM | POA: Diagnosis not present

## 2023-06-18 DIAGNOSIS — Z992 Dependence on renal dialysis: Secondary | ICD-10-CM | POA: Diagnosis not present

## 2023-06-19 DIAGNOSIS — R269 Unspecified abnormalities of gait and mobility: Secondary | ICD-10-CM | POA: Diagnosis not present

## 2023-06-19 DIAGNOSIS — I5032 Chronic diastolic (congestive) heart failure: Secondary | ICD-10-CM | POA: Diagnosis not present

## 2023-06-19 DIAGNOSIS — G894 Chronic pain syndrome: Secondary | ICD-10-CM | POA: Diagnosis not present

## 2023-06-19 DIAGNOSIS — N2581 Secondary hyperparathyroidism of renal origin: Secondary | ICD-10-CM | POA: Diagnosis not present

## 2023-06-19 DIAGNOSIS — L942 Calcinosis cutis: Secondary | ICD-10-CM | POA: Diagnosis not present

## 2023-06-19 DIAGNOSIS — Z992 Dependence on renal dialysis: Secondary | ICD-10-CM | POA: Diagnosis not present

## 2023-06-19 DIAGNOSIS — M5416 Radiculopathy, lumbar region: Secondary | ICD-10-CM | POA: Diagnosis not present

## 2023-06-19 DIAGNOSIS — R29898 Other symptoms and signs involving the musculoskeletal system: Secondary | ICD-10-CM | POA: Diagnosis not present

## 2023-06-19 DIAGNOSIS — R001 Bradycardia, unspecified: Secondary | ICD-10-CM | POA: Diagnosis not present

## 2023-06-19 DIAGNOSIS — E1129 Type 2 diabetes mellitus with other diabetic kidney complication: Secondary | ICD-10-CM | POA: Diagnosis not present

## 2023-06-19 DIAGNOSIS — R2681 Unsteadiness on feet: Secondary | ICD-10-CM | POA: Diagnosis not present

## 2023-06-19 DIAGNOSIS — G4733 Obstructive sleep apnea (adult) (pediatric): Secondary | ICD-10-CM | POA: Diagnosis not present

## 2023-06-19 DIAGNOSIS — I872 Venous insufficiency (chronic) (peripheral): Secondary | ICD-10-CM | POA: Diagnosis not present

## 2023-06-19 DIAGNOSIS — I130 Hypertensive heart and chronic kidney disease with heart failure and stage 1 through stage 4 chronic kidney disease, or unspecified chronic kidney disease: Secondary | ICD-10-CM | POA: Diagnosis not present

## 2023-06-19 DIAGNOSIS — E039 Hypothyroidism, unspecified: Secondary | ICD-10-CM | POA: Diagnosis not present

## 2023-06-19 DIAGNOSIS — D631 Anemia in chronic kidney disease: Secondary | ICD-10-CM | POA: Diagnosis not present

## 2023-06-19 DIAGNOSIS — M1712 Unilateral primary osteoarthritis, left knee: Secondary | ICD-10-CM | POA: Diagnosis not present

## 2023-06-19 DIAGNOSIS — N186 End stage renal disease: Secondary | ICD-10-CM | POA: Diagnosis not present

## 2023-06-20 DIAGNOSIS — Z992 Dependence on renal dialysis: Secondary | ICD-10-CM | POA: Diagnosis not present

## 2023-06-20 DIAGNOSIS — N186 End stage renal disease: Secondary | ICD-10-CM | POA: Diagnosis not present

## 2023-06-20 DIAGNOSIS — N2581 Secondary hyperparathyroidism of renal origin: Secondary | ICD-10-CM | POA: Diagnosis not present

## 2023-06-21 DIAGNOSIS — I871 Compression of vein: Secondary | ICD-10-CM | POA: Diagnosis not present

## 2023-06-21 DIAGNOSIS — Z992 Dependence on renal dialysis: Secondary | ICD-10-CM | POA: Diagnosis not present

## 2023-06-21 DIAGNOSIS — E1129 Type 2 diabetes mellitus with other diabetic kidney complication: Secondary | ICD-10-CM | POA: Diagnosis not present

## 2023-06-21 DIAGNOSIS — N186 End stage renal disease: Secondary | ICD-10-CM | POA: Diagnosis not present

## 2023-06-22 DIAGNOSIS — Z992 Dependence on renal dialysis: Secondary | ICD-10-CM | POA: Diagnosis not present

## 2023-06-22 DIAGNOSIS — N186 End stage renal disease: Secondary | ICD-10-CM | POA: Diagnosis not present

## 2023-06-22 DIAGNOSIS — N2581 Secondary hyperparathyroidism of renal origin: Secondary | ICD-10-CM | POA: Diagnosis not present

## 2023-06-25 DIAGNOSIS — N186 End stage renal disease: Secondary | ICD-10-CM | POA: Diagnosis not present

## 2023-06-25 DIAGNOSIS — Z992 Dependence on renal dialysis: Secondary | ICD-10-CM | POA: Diagnosis not present

## 2023-06-25 DIAGNOSIS — N2581 Secondary hyperparathyroidism of renal origin: Secondary | ICD-10-CM | POA: Diagnosis not present

## 2023-06-27 DIAGNOSIS — Z992 Dependence on renal dialysis: Secondary | ICD-10-CM | POA: Diagnosis not present

## 2023-06-27 DIAGNOSIS — N186 End stage renal disease: Secondary | ICD-10-CM | POA: Diagnosis not present

## 2023-06-27 DIAGNOSIS — N2581 Secondary hyperparathyroidism of renal origin: Secondary | ICD-10-CM | POA: Diagnosis not present

## 2023-06-29 DIAGNOSIS — N2581 Secondary hyperparathyroidism of renal origin: Secondary | ICD-10-CM | POA: Diagnosis not present

## 2023-06-29 DIAGNOSIS — N186 End stage renal disease: Secondary | ICD-10-CM | POA: Diagnosis not present

## 2023-06-29 DIAGNOSIS — Z992 Dependence on renal dialysis: Secondary | ICD-10-CM | POA: Diagnosis not present

## 2023-07-02 DIAGNOSIS — N2581 Secondary hyperparathyroidism of renal origin: Secondary | ICD-10-CM | POA: Diagnosis not present

## 2023-07-02 DIAGNOSIS — Z992 Dependence on renal dialysis: Secondary | ICD-10-CM | POA: Diagnosis not present

## 2023-07-02 DIAGNOSIS — N186 End stage renal disease: Secondary | ICD-10-CM | POA: Diagnosis not present

## 2023-07-04 DIAGNOSIS — Z992 Dependence on renal dialysis: Secondary | ICD-10-CM | POA: Diagnosis not present

## 2023-07-04 DIAGNOSIS — N186 End stage renal disease: Secondary | ICD-10-CM | POA: Diagnosis not present

## 2023-07-04 DIAGNOSIS — N2581 Secondary hyperparathyroidism of renal origin: Secondary | ICD-10-CM | POA: Diagnosis not present

## 2023-07-05 ENCOUNTER — Ambulatory Visit: Payer: Medicare HMO | Admitting: Podiatry

## 2023-07-06 DIAGNOSIS — Z992 Dependence on renal dialysis: Secondary | ICD-10-CM | POA: Diagnosis not present

## 2023-07-06 DIAGNOSIS — N186 End stage renal disease: Secondary | ICD-10-CM | POA: Diagnosis not present

## 2023-07-06 DIAGNOSIS — N2581 Secondary hyperparathyroidism of renal origin: Secondary | ICD-10-CM | POA: Diagnosis not present

## 2023-07-09 DIAGNOSIS — E1122 Type 2 diabetes mellitus with diabetic chronic kidney disease: Secondary | ICD-10-CM | POA: Diagnosis not present

## 2023-07-09 DIAGNOSIS — N186 End stage renal disease: Secondary | ICD-10-CM | POA: Diagnosis not present

## 2023-07-09 DIAGNOSIS — Z992 Dependence on renal dialysis: Secondary | ICD-10-CM | POA: Diagnosis not present

## 2023-07-09 DIAGNOSIS — N2581 Secondary hyperparathyroidism of renal origin: Secondary | ICD-10-CM | POA: Diagnosis not present

## 2023-07-11 DIAGNOSIS — N2581 Secondary hyperparathyroidism of renal origin: Secondary | ICD-10-CM | POA: Diagnosis not present

## 2023-07-11 DIAGNOSIS — Z992 Dependence on renal dialysis: Secondary | ICD-10-CM | POA: Diagnosis not present

## 2023-07-11 DIAGNOSIS — N186 End stage renal disease: Secondary | ICD-10-CM | POA: Diagnosis not present

## 2023-07-12 ENCOUNTER — Encounter: Payer: Self-pay | Admitting: Podiatry

## 2023-07-12 ENCOUNTER — Ambulatory Visit: Payer: Medicare HMO | Admitting: Podiatry

## 2023-07-12 DIAGNOSIS — M79675 Pain in left toe(s): Secondary | ICD-10-CM

## 2023-07-12 DIAGNOSIS — B351 Tinea unguium: Secondary | ICD-10-CM | POA: Diagnosis not present

## 2023-07-12 DIAGNOSIS — E118 Type 2 diabetes mellitus with unspecified complications: Secondary | ICD-10-CM

## 2023-07-12 DIAGNOSIS — M79674 Pain in right toe(s): Secondary | ICD-10-CM

## 2023-07-12 NOTE — Progress Notes (Signed)
This patient returns to my office for at risk foot care.  This patient requires this care by a professional since this patient will be at risk due to having CKD, and type 2 diabetes  This patient is unable to cut nails himself since the patient cannot reach his nails.These nails are painful walking and wearing shoes.  This patient presents for at risk foot care today.  General Appearance  Alert, conversant and in no acute stress.  Vascular  Dorsalis pedis and posterior tibial  pulses are weakly palpable  bilaterally.  Capillary return is within normal limits  bilaterally. Temperature is within normal limits  bilaterally.  Neurologic  Senn-Weinstein monofilament wire test within normal limits  bilaterally. Muscle power within normal limits bilaterally.  Nails Thick disfigured discolored nails with subungual debris  from hallux to fifth toes bilaterally. No evidence of bacterial infection or drainage bilaterally.  Orthopedic  No limitations of motion  feet .  No crepitus or effusions noted.  No bony pathology or digital deformities noted. HAV  B/L.  Skin  normotropic skin with no porokeratosis noted bilaterally.  No signs of infections or ulcers noted.     Onychomycosis  Pain in right toes  Pain in left toes  Consent was obtained for treatment procedures.   Mechanical debridement of nails 1-5  bilaterally performed with a nail nipper.  Filed with dremel without incident.    Return office visit    3 months                 Told patient to return for periodic foot care and evaluation due to potential at risk complications.   Miyonna Ormiston DPM  

## 2023-07-13 DIAGNOSIS — N186 End stage renal disease: Secondary | ICD-10-CM | POA: Diagnosis not present

## 2023-07-13 DIAGNOSIS — Z992 Dependence on renal dialysis: Secondary | ICD-10-CM | POA: Diagnosis not present

## 2023-07-13 DIAGNOSIS — N2581 Secondary hyperparathyroidism of renal origin: Secondary | ICD-10-CM | POA: Diagnosis not present

## 2023-07-16 DIAGNOSIS — Z992 Dependence on renal dialysis: Secondary | ICD-10-CM | POA: Diagnosis not present

## 2023-07-16 DIAGNOSIS — N2581 Secondary hyperparathyroidism of renal origin: Secondary | ICD-10-CM | POA: Diagnosis not present

## 2023-07-16 DIAGNOSIS — N186 End stage renal disease: Secondary | ICD-10-CM | POA: Diagnosis not present

## 2023-07-18 DIAGNOSIS — N2581 Secondary hyperparathyroidism of renal origin: Secondary | ICD-10-CM | POA: Diagnosis not present

## 2023-07-18 DIAGNOSIS — N186 End stage renal disease: Secondary | ICD-10-CM | POA: Diagnosis not present

## 2023-07-18 DIAGNOSIS — Z992 Dependence on renal dialysis: Secondary | ICD-10-CM | POA: Diagnosis not present

## 2023-07-20 DIAGNOSIS — N2581 Secondary hyperparathyroidism of renal origin: Secondary | ICD-10-CM | POA: Diagnosis not present

## 2023-07-20 DIAGNOSIS — N186 End stage renal disease: Secondary | ICD-10-CM | POA: Diagnosis not present

## 2023-07-20 DIAGNOSIS — Z992 Dependence on renal dialysis: Secondary | ICD-10-CM | POA: Diagnosis not present

## 2023-07-23 DIAGNOSIS — Z992 Dependence on renal dialysis: Secondary | ICD-10-CM | POA: Diagnosis not present

## 2023-07-23 DIAGNOSIS — N186 End stage renal disease: Secondary | ICD-10-CM | POA: Diagnosis not present

## 2023-07-23 DIAGNOSIS — N2581 Secondary hyperparathyroidism of renal origin: Secondary | ICD-10-CM | POA: Diagnosis not present

## 2023-07-25 DIAGNOSIS — N2581 Secondary hyperparathyroidism of renal origin: Secondary | ICD-10-CM | POA: Diagnosis not present

## 2023-07-25 DIAGNOSIS — Z992 Dependence on renal dialysis: Secondary | ICD-10-CM | POA: Diagnosis not present

## 2023-07-25 DIAGNOSIS — N186 End stage renal disease: Secondary | ICD-10-CM | POA: Diagnosis not present

## 2023-07-27 DIAGNOSIS — Z992 Dependence on renal dialysis: Secondary | ICD-10-CM | POA: Diagnosis not present

## 2023-07-27 DIAGNOSIS — N186 End stage renal disease: Secondary | ICD-10-CM | POA: Diagnosis not present

## 2023-07-27 DIAGNOSIS — N2581 Secondary hyperparathyroidism of renal origin: Secondary | ICD-10-CM | POA: Diagnosis not present

## 2023-07-30 DIAGNOSIS — N2581 Secondary hyperparathyroidism of renal origin: Secondary | ICD-10-CM | POA: Diagnosis not present

## 2023-07-30 DIAGNOSIS — Z992 Dependence on renal dialysis: Secondary | ICD-10-CM | POA: Diagnosis not present

## 2023-07-30 DIAGNOSIS — N186 End stage renal disease: Secondary | ICD-10-CM | POA: Diagnosis not present

## 2023-08-01 DIAGNOSIS — N186 End stage renal disease: Secondary | ICD-10-CM | POA: Diagnosis not present

## 2023-08-01 DIAGNOSIS — N2581 Secondary hyperparathyroidism of renal origin: Secondary | ICD-10-CM | POA: Diagnosis not present

## 2023-08-01 DIAGNOSIS — Z992 Dependence on renal dialysis: Secondary | ICD-10-CM | POA: Diagnosis not present

## 2023-08-02 DIAGNOSIS — E119 Type 2 diabetes mellitus without complications: Secondary | ICD-10-CM | POA: Diagnosis not present

## 2023-08-03 DIAGNOSIS — N2581 Secondary hyperparathyroidism of renal origin: Secondary | ICD-10-CM | POA: Diagnosis not present

## 2023-08-03 DIAGNOSIS — Z992 Dependence on renal dialysis: Secondary | ICD-10-CM | POA: Diagnosis not present

## 2023-08-03 DIAGNOSIS — N186 End stage renal disease: Secondary | ICD-10-CM | POA: Diagnosis not present

## 2023-08-06 DIAGNOSIS — Z992 Dependence on renal dialysis: Secondary | ICD-10-CM | POA: Diagnosis not present

## 2023-08-06 DIAGNOSIS — N2581 Secondary hyperparathyroidism of renal origin: Secondary | ICD-10-CM | POA: Diagnosis not present

## 2023-08-06 DIAGNOSIS — N186 End stage renal disease: Secondary | ICD-10-CM | POA: Diagnosis not present

## 2023-08-08 DIAGNOSIS — N2581 Secondary hyperparathyroidism of renal origin: Secondary | ICD-10-CM | POA: Diagnosis not present

## 2023-08-08 DIAGNOSIS — N186 End stage renal disease: Secondary | ICD-10-CM | POA: Diagnosis not present

## 2023-08-08 DIAGNOSIS — Z992 Dependence on renal dialysis: Secondary | ICD-10-CM | POA: Diagnosis not present

## 2023-08-09 DIAGNOSIS — Z992 Dependence on renal dialysis: Secondary | ICD-10-CM | POA: Diagnosis not present

## 2023-08-09 DIAGNOSIS — E1122 Type 2 diabetes mellitus with diabetic chronic kidney disease: Secondary | ICD-10-CM | POA: Diagnosis not present

## 2023-08-09 DIAGNOSIS — N186 End stage renal disease: Secondary | ICD-10-CM | POA: Diagnosis not present

## 2023-08-10 DIAGNOSIS — N2581 Secondary hyperparathyroidism of renal origin: Secondary | ICD-10-CM | POA: Diagnosis not present

## 2023-08-10 DIAGNOSIS — N186 End stage renal disease: Secondary | ICD-10-CM | POA: Diagnosis not present

## 2023-08-10 DIAGNOSIS — Z992 Dependence on renal dialysis: Secondary | ICD-10-CM | POA: Diagnosis not present

## 2023-08-13 DIAGNOSIS — N186 End stage renal disease: Secondary | ICD-10-CM | POA: Diagnosis not present

## 2023-08-13 DIAGNOSIS — Z992 Dependence on renal dialysis: Secondary | ICD-10-CM | POA: Diagnosis not present

## 2023-08-13 DIAGNOSIS — N2581 Secondary hyperparathyroidism of renal origin: Secondary | ICD-10-CM | POA: Diagnosis not present

## 2023-08-15 DIAGNOSIS — N186 End stage renal disease: Secondary | ICD-10-CM | POA: Diagnosis not present

## 2023-08-15 DIAGNOSIS — Z992 Dependence on renal dialysis: Secondary | ICD-10-CM | POA: Diagnosis not present

## 2023-08-15 DIAGNOSIS — E1129 Type 2 diabetes mellitus with other diabetic kidney complication: Secondary | ICD-10-CM | POA: Diagnosis not present

## 2023-08-15 DIAGNOSIS — N2581 Secondary hyperparathyroidism of renal origin: Secondary | ICD-10-CM | POA: Diagnosis not present

## 2023-08-16 ENCOUNTER — Encounter (HOSPITAL_COMMUNITY): Payer: Self-pay

## 2023-08-16 ENCOUNTER — Other Ambulatory Visit: Payer: Self-pay

## 2023-08-16 ENCOUNTER — Emergency Department (HOSPITAL_COMMUNITY)
Admission: EM | Admit: 2023-08-16 | Discharge: 2023-08-16 | Disposition: A | Discharge status: Home / Self Care | Payer: Medicare HMO | Attending: Emergency Medicine | Admitting: Emergency Medicine

## 2023-08-16 ENCOUNTER — Emergency Department (HOSPITAL_COMMUNITY): Payer: Medicare HMO

## 2023-08-16 DIAGNOSIS — Z7982 Long term (current) use of aspirin: Secondary | ICD-10-CM | POA: Diagnosis not present

## 2023-08-16 DIAGNOSIS — E162 Hypoglycemia, unspecified: Secondary | ICD-10-CM

## 2023-08-16 DIAGNOSIS — Z992 Dependence on renal dialysis: Secondary | ICD-10-CM | POA: Insufficient documentation

## 2023-08-16 DIAGNOSIS — E11649 Type 2 diabetes mellitus with hypoglycemia without coma: Secondary | ICD-10-CM | POA: Insufficient documentation

## 2023-08-16 DIAGNOSIS — I120 Hypertensive chronic kidney disease with stage 5 chronic kidney disease or end stage renal disease: Secondary | ICD-10-CM | POA: Diagnosis not present

## 2023-08-16 DIAGNOSIS — J189 Pneumonia, unspecified organism: Secondary | ICD-10-CM | POA: Diagnosis not present

## 2023-08-16 DIAGNOSIS — Z79899 Other long term (current) drug therapy: Secondary | ICD-10-CM | POA: Insufficient documentation

## 2023-08-16 DIAGNOSIS — N186 End stage renal disease: Secondary | ICD-10-CM | POA: Insufficient documentation

## 2023-08-16 DIAGNOSIS — Z1152 Encounter for screening for COVID-19: Secondary | ICD-10-CM | POA: Insufficient documentation

## 2023-08-16 DIAGNOSIS — R0602 Shortness of breath: Secondary | ICD-10-CM | POA: Diagnosis not present

## 2023-08-16 DIAGNOSIS — Z794 Long term (current) use of insulin: Secondary | ICD-10-CM | POA: Insufficient documentation

## 2023-08-16 DIAGNOSIS — I700 Atherosclerosis of aorta: Secondary | ICD-10-CM | POA: Diagnosis not present

## 2023-08-16 LAB — BASIC METABOLIC PANEL WITH GFR
Anion gap: 16 — ABNORMAL HIGH (ref 5–15)
BUN: 47 mg/dL — ABNORMAL HIGH (ref 8–23)
CO2: 27 mmol/L (ref 22–32)
Calcium: 9.8 mg/dL (ref 8.9–10.3)
Chloride: 94 mmol/L — ABNORMAL LOW (ref 98–111)
Creatinine, Ser: 8.03 mg/dL — ABNORMAL HIGH (ref 0.61–1.24)
GFR, Estimated: 6 mL/min — ABNORMAL LOW
Glucose, Bld: 57 mg/dL — ABNORMAL LOW (ref 70–99)
Potassium: 4.1 mmol/L (ref 3.5–5.1)
Sodium: 137 mmol/L (ref 135–145)

## 2023-08-16 LAB — CBC
HCT: 42 % (ref 39.0–52.0)
Hemoglobin: 12.9 g/dL — ABNORMAL LOW (ref 13.0–17.0)
MCH: 28.8 pg (ref 26.0–34.0)
MCHC: 30.7 g/dL (ref 30.0–36.0)
MCV: 93.8 fL (ref 80.0–100.0)
Platelets: 205 10*3/uL (ref 150–400)
RBC: 4.48 MIL/uL (ref 4.22–5.81)
RDW: 14.9 % (ref 11.5–15.5)
WBC: 11.9 10*3/uL — ABNORMAL HIGH (ref 4.0–10.5)
nRBC: 0 % (ref 0.0–0.2)

## 2023-08-16 LAB — SARS CORONAVIRUS 2 BY RT PCR: SARS Coronavirus 2 by RT PCR: NEGATIVE

## 2023-08-16 LAB — CBG MONITORING, ED
Glucose-Capillary: 43 mg/dL — CL (ref 70–99)
Glucose-Capillary: 47 mg/dL — ABNORMAL LOW (ref 70–99)
Glucose-Capillary: 99 mg/dL (ref 70–99)

## 2023-08-16 MED ORDER — DOXYCYCLINE HYCLATE 100 MG PO TABS
100.0000 mg | ORAL_TABLET | Freq: Once | ORAL | Status: AC
  Administered 2023-08-16: 100 mg via ORAL
  Filled 2023-08-16: qty 1

## 2023-08-16 MED ORDER — DOXYCYCLINE HYCLATE 100 MG PO CAPS: 100.0000 mg | ORAL_CAPSULE | Freq: Two times a day (BID) | ORAL | 0 refills | Status: AC

## 2023-08-16 MED ORDER — SODIUM CHLORIDE 0.9% FLUSH
3.0000 mL | Freq: Two times a day (BID) | INTRAVENOUS | Status: DC
Start: 2023-08-16 — End: 2023-08-16

## 2023-08-16 MED ORDER — SODIUM CHLORIDE 0.9% FLUSH: 3.0000 mL | INTRAVENOUS | Status: DC | PRN

## 2023-08-16 MED ORDER — TRAMADOL HCL 50 MG PO TABS
50.0000 mg | ORAL_TABLET | Freq: Once | ORAL | Status: AC
  Administered 2023-08-16: 50 mg via ORAL
  Filled 2023-08-16: qty 1

## 2023-08-16 NOTE — ED Notes (Signed)
Patient transported to x-ray. ?

## 2023-08-16 NOTE — ED Triage Notes (Signed)
Pt/wife stated, He has been running a low oxygen and has not felt good for the last few days.

## 2023-08-16 NOTE — ED Provider Notes (Signed)
[facility] AT [facility] Provider Note   CSN: [id number] Arrival date & time: 08/16/23  4098     History  Chief Complaint  Patient presents with   Shortness of Breath   Nasal Congestion    David Nguyen is a 82 y.o. male.  HPI 82 year old male presents with cough and fatigue.  History is from daughter at the bedside as well as the patient.  Patient's had about 3 days of cough, red eyes, transient sore throat (now gone) and fatigue.  The daughter has similar symptoms.  She checked his pulse ox this morning and at first it was in the 90s but then later she got a reading of 81%.  It quickly came up and went back into the 90s.  He does not wear oxygen.  Patient right now feels okay.  He last went to dialysis yesterday.  He denies chest pain, fever, or shortness of breath.  Patient has multiple comorbidities including this ESRD on dialysis, diabetes, hypertension, sleep apnea.  Home Medications Prior to Admission medications   Medication Sig Start Date End Date Taking? Authorizing Provider  acetaminophen (TYLENOL) 650 MG CR tablet Take 1,300 mg by mouth as needed for pain.   Yes [provider]  allopurinol (ZYLOPRIM) 100 MG tablet Take 100 mg by mouth daily. 12/07/15  Yes [provider]  aspirin EC 81 MG tablet Take 81 mg by mouth daily.   Yes [provider]  atorvastatin (LIPITOR) 40 MG tablet Take 40 mg by mouth at bedtime.    Yes [provider]  Docusate Sodium (DSS) 100 MG CAPS Take 100 mg by mouth in the morning and at bedtime. 05/04/21  Yes [provider]  doxycycline (VIBRAMYCIN) 100 MG capsule Take 1 capsule (100 mg total) by mouth 2 (two) times daily. One po bid x 7 days 08/16/23  Yes Pricilla Loveless, MD  levothyroxine (SYNTHROID, LEVOTHROID) 75 MCG tablet Take 75 mcg by mouth daily before breakfast.   Yes [provider]  loratadine (CLARITIN) 10 MG tablet Take 10 mg by mouth daily.   Yes  [provider]  midodrine (PROAMATINE) 5 MG tablet Take 5 mg by mouth 3 (three) times a week. Take 10 mg by mouth on Monday, Wednesday, Friday 30 minutes before dialysis 08/09/23  Yes [provider]  PRESCRIPTION MEDICATION Inject 10 Units into the skin every evening. Unknown insulin   Yes [provider]  sevelamer carbonate (RENVELA) 800 MG tablet Take 800 mg by mouth 3 (three) times daily.   Yes [provider]  traMADol (ULTRAM) 50 MG tablet Take 25 mg by mouth in the morning and at bedtime.   Yes [provider]  insulin NPH-regular Human (70-30) 100 UNIT/ML injection Inject 16-18 Units into the skin See admin instructions. Inject 18 units in the morning and 16 units at night    [provider]  montelukast (SINGULAIR) 10 MG tablet Take 10 mg by mouth daily. Patient not taking: Reported on 08/16/2023 05/21/20   [provider]  PRESCRIPTION MEDICATION Inhale into the lungs at bedtime. CPAP    [provider]      Allergies    Penicillins    Review of Systems   Review of Systems  Constitutional:  Positive for fatigue. Negative for fever.  Respiratory:  Positive for cough. Negative for shortness of breath.   Cardiovascular:  Negative for chest pain and leg swelling.  Gastrointestinal:  Negative for diarrhea and vomiting.  Physical Exam Updated Vital Signs BP (!) 125/56   Pulse 66   Temp 97.8 F (36.6 C)   Resp 16   Ht 6' (1.829 m)   Wt 86.2 kg   SpO2 100%   BMI 25.77 kg/m  Physical Exam Vitals and nursing note reviewed.  Constitutional:      Appearance: He is well-developed.  HENT:     Head: Normocephalic and atraumatic.  Cardiovascular:     Rate and Rhythm: Normal rate and regular rhythm.     Heart sounds: Normal heart sounds.  Pulmonary:     Effort: Pulmonary effort is normal.     Breath sounds: Normal breath sounds. No decreased breath sounds, wheezing, rhonchi or rales.  Abdominal:      Palpations: Abdomen is soft.     Tenderness: There is no abdominal tenderness.  Skin:    General: Skin is warm and dry.  Neurological:     Mental Status: He is alert.     Comments: 5/5 strength in all 4 extremities.     ED Results / Procedures / Treatments   Labs (all labs ordered are listed, but only abnormal results are displayed) Labs Reviewed  BASIC METABOLIC PANEL - Abnormal; Notable for the following components:      Result Value   Chloride 94 (*)    Glucose, Bld 57 (*)    BUN 47 (*)    Creatinine, Ser 8.03 (*)    GFR, Estimated 6 (*)    Anion gap 16 (*)    All other components within normal limits  CBC - Abnormal; Notable for the following components:   WBC 11.9 (*)    Hemoglobin 12.9 (*)    All other components within normal limits  CBG MONITORING, ED - Abnormal; Notable for the following components:   Glucose-Capillary 43 (*)    All other components within normal limits  CBG MONITORING, ED - Abnormal; Notable for the following components:   Glucose-Capillary 47 (*)    All other components within normal limits  SARS CORONAVIRUS 2 BY RT PCR  CBG MONITORING, ED  CBG MONITORING, ED    EKG EKG Interpretation Date/Time:  Thursday August 16 2023 07:25:10 EST Ventricular Rate:  94 PR Interval:  162 QRS Duration:  140 QT Interval:  404 QTC Calculation: 505 R Axis:   -50  Text Interpretation: Normal sinus rhythm Right bundle branch block Left anterior fascicular block Bifascicular block Minimal voltage criteria for LVH, may be normal variant ( R in aVL ) Possible Lateral infarct , age undetermined  rate is faster compared to July 2021 Confirmed by Pricilla Loveless [phone]) on 08/16/2023 7:29:56 AM  Radiology DG Chest 2 View  Result Date: 08/16/2023 CLINICAL DATA:  SOB EXAM: CHEST - 2 VIEW COMPARISON:  Chest radiograph dated April 20, 2020 FINDINGS: The heart size and mediastinal contours are within normal limits. Aortic atherosclerosis. Bibasilar interstitial  prominence. No focal consolidation, pneumothorax, or pleural effusion. No acute osseous abnormality. IMPRESSION: Bibasilar interstitial prominence could represent pulmonary vascular congestion or atypical infection. Electronically Signed   By: Hart Robinsons M.D.   On: 08/16/2023 08:26    Procedures Procedures    Medications Ordered in ED Medications  sodium chloride flush (NS) 0.9 % injection 3 mL (3 mLs Intravenous Not Given 08/16/23 1156)  sodium chloride flush (NS) 0.9 % injection 3 mL (has no administration in time range)  traMADol (ULTRAM) tablet 50 mg (50 mg Oral Given 08/16/23 1155)  doxycycline (VIBRA-TABS) tablet 100 mg (100  mg Oral Given 08/16/23 1155)    ED Course/ Medical Decision Making/ A&P                                 Medical Decision Making Amount and/or Complexity of Data Reviewed Independent Historian: caregiver    Details: daughter External Data Reviewed: notes. Labs: ordered.    Details: Hypoglycemia Radiology: ordered and independent interpretation performed.    Details: No lobar pneumonia ECG/medicine tests: ordered and independent interpretation performed.    Details: No acute ischemia  Risk Prescription drug management.   Patient likely has a viral illness but with x-ray showing possible atypical infection and his significant comorbidities including ESRD on dialysis, will treat with doxycycline to cover atypical organisms.  Otherwise, he is not hypoxic here and probably this was a false bleed low value at home.  He is otherwise feeling and appearing well.  Hypoglycemia was noted on his lab work, though is asymptomatic. Last took insulin last night, hasn't eaten this AM. Given juice and sandwich, glucose unchanged.  Given more juice and then his glucose did come up to 99.  Discussed with patient and daughter, they feel comfortable going home and discussed the need to call his PCP about further outpatient insulin management.  Stable for discharge home  with return precautions.  Is due for dialysis tomorrow.       Final Clinical Impression(s) / ED Diagnoses Final diagnoses:  Atypical pneumonia  Hypoglycemia    Rx / DC Orders ED Discharge Orders          Ordered    doxycycline (VIBRAMYCIN) 100 MG capsule  2 times daily        08/16/23 1045              Pricilla Loveless, MD 08/16/23 1227

## 2023-08-16 NOTE — Discharge Instructions (Addendum)
Your glucose/sugar was low today.  Call your doctor for further instructions on how to adjust your insulin, do not take any other insulin today until you are cleared by your doctor.  Otherwise, eat and drink appropriately.  Your x-ray shows possible pneumonia.  We are treating you with doxycycline to help with this.  If you develop new or worsening cough, shortness of breath, fever, or any other new/concerning symptoms then return to the ER or call 911.

## 2023-08-16 NOTE — ED Notes (Signed)
Tiffany RN made aware of CBG. Pt given orange juice and [place] sandwich per DR.Goldston.. Daughter at tbedside.

## 2023-08-16 NOTE — ED Notes (Signed)
Dr.Goldston made aware of 2nd CBG>

## 2023-08-17 DIAGNOSIS — Z992 Dependence on renal dialysis: Secondary | ICD-10-CM | POA: Diagnosis not present

## 2023-08-17 DIAGNOSIS — N186 End stage renal disease: Secondary | ICD-10-CM | POA: Diagnosis not present

## 2023-08-17 DIAGNOSIS — N2581 Secondary hyperparathyroidism of renal origin: Secondary | ICD-10-CM | POA: Diagnosis not present

## 2023-08-20 DIAGNOSIS — N2581 Secondary hyperparathyroidism of renal origin: Secondary | ICD-10-CM | POA: Diagnosis not present

## 2023-08-20 DIAGNOSIS — N186 End stage renal disease: Secondary | ICD-10-CM | POA: Diagnosis not present

## 2023-08-20 DIAGNOSIS — Z992 Dependence on renal dialysis: Secondary | ICD-10-CM | POA: Diagnosis not present

## 2023-08-22 DIAGNOSIS — Z992 Dependence on renal dialysis: Secondary | ICD-10-CM | POA: Diagnosis not present

## 2023-08-22 DIAGNOSIS — N2581 Secondary hyperparathyroidism of renal origin: Secondary | ICD-10-CM | POA: Diagnosis not present

## 2023-08-22 DIAGNOSIS — N186 End stage renal disease: Secondary | ICD-10-CM | POA: Diagnosis not present

## 2023-08-24 DIAGNOSIS — N2581 Secondary hyperparathyroidism of renal origin: Secondary | ICD-10-CM | POA: Diagnosis not present

## 2023-08-24 DIAGNOSIS — Z992 Dependence on renal dialysis: Secondary | ICD-10-CM | POA: Diagnosis not present

## 2023-08-24 DIAGNOSIS — N186 End stage renal disease: Secondary | ICD-10-CM | POA: Diagnosis not present

## 2023-08-27 DIAGNOSIS — N186 End stage renal disease: Secondary | ICD-10-CM | POA: Diagnosis not present

## 2023-08-27 DIAGNOSIS — Z992 Dependence on renal dialysis: Secondary | ICD-10-CM | POA: Diagnosis not present

## 2023-08-27 DIAGNOSIS — N2581 Secondary hyperparathyroidism of renal origin: Secondary | ICD-10-CM | POA: Diagnosis not present

## 2023-08-29 DIAGNOSIS — Z992 Dependence on renal dialysis: Secondary | ICD-10-CM | POA: Diagnosis not present

## 2023-08-29 DIAGNOSIS — N2581 Secondary hyperparathyroidism of renal origin: Secondary | ICD-10-CM | POA: Diagnosis not present

## 2023-08-29 DIAGNOSIS — N186 End stage renal disease: Secondary | ICD-10-CM | POA: Diagnosis not present

## 2023-08-31 DIAGNOSIS — N186 End stage renal disease: Secondary | ICD-10-CM | POA: Diagnosis not present

## 2023-08-31 DIAGNOSIS — Z992 Dependence on renal dialysis: Secondary | ICD-10-CM | POA: Diagnosis not present

## 2023-08-31 DIAGNOSIS — N2581 Secondary hyperparathyroidism of renal origin: Secondary | ICD-10-CM | POA: Diagnosis not present

## 2023-09-02 DIAGNOSIS — Z992 Dependence on renal dialysis: Secondary | ICD-10-CM | POA: Diagnosis not present

## 2023-09-02 DIAGNOSIS — N2581 Secondary hyperparathyroidism of renal origin: Secondary | ICD-10-CM | POA: Diagnosis not present

## 2023-09-02 DIAGNOSIS — N186 End stage renal disease: Secondary | ICD-10-CM | POA: Diagnosis not present

## 2023-09-04 DIAGNOSIS — N186 End stage renal disease: Secondary | ICD-10-CM | POA: Diagnosis not present

## 2023-09-04 DIAGNOSIS — N2581 Secondary hyperparathyroidism of renal origin: Secondary | ICD-10-CM | POA: Diagnosis not present

## 2023-09-04 DIAGNOSIS — Z992 Dependence on renal dialysis: Secondary | ICD-10-CM | POA: Diagnosis not present

## 2023-09-05 ENCOUNTER — Telehealth: Payer: Self-pay

## 2023-09-05 NOTE — Telephone Encounter (Signed)
Transition Care Management Unsuccessful Follow-up Telephone Call  Date of discharge and from where:  [facility] 11/7  Attempts:  1st Attempt  Reason for unsuccessful TCM follow-up call:  No answer/busy   Lenard Forth Harvey  [facility], [facility] Guide, Phone: [phone] Website: [email or web address]

## 2023-09-07 ENCOUNTER — Telehealth: Payer: Self-pay

## 2023-09-07 DIAGNOSIS — Z992 Dependence on renal dialysis: Secondary | ICD-10-CM | POA: Diagnosis not present

## 2023-09-07 DIAGNOSIS — N2581 Secondary hyperparathyroidism of renal origin: Secondary | ICD-10-CM | POA: Diagnosis not present

## 2023-09-07 DIAGNOSIS — N186 End stage renal disease: Secondary | ICD-10-CM | POA: Diagnosis not present

## 2023-09-07 NOTE — Telephone Encounter (Signed)
Transition Care Management Unsuccessful Follow-up Telephone Call  Date of discharge and from where:  [facility] 11/7  Attempts:  2nd Attempt  Reason for unsuccessful TCM follow-up call:  No answer/busy      Lenard Forth Bingham  [facility], [facility] Guide, Phone: [phone] Website: [email or web address]

## 2023-09-08 DIAGNOSIS — Z992 Dependence on renal dialysis: Secondary | ICD-10-CM | POA: Diagnosis not present

## 2023-09-08 DIAGNOSIS — E1122 Type 2 diabetes mellitus with diabetic chronic kidney disease: Secondary | ICD-10-CM | POA: Diagnosis not present

## 2023-09-08 DIAGNOSIS — N186 End stage renal disease: Secondary | ICD-10-CM | POA: Diagnosis not present

## 2023-09-10 DIAGNOSIS — Z992 Dependence on renal dialysis: Secondary | ICD-10-CM | POA: Diagnosis not present

## 2023-09-10 DIAGNOSIS — N2581 Secondary hyperparathyroidism of renal origin: Secondary | ICD-10-CM | POA: Diagnosis not present

## 2023-09-10 DIAGNOSIS — N186 End stage renal disease: Secondary | ICD-10-CM | POA: Diagnosis not present

## 2023-09-12 DIAGNOSIS — N186 End stage renal disease: Secondary | ICD-10-CM | POA: Diagnosis not present

## 2023-09-12 DIAGNOSIS — Z992 Dependence on renal dialysis: Secondary | ICD-10-CM | POA: Diagnosis not present

## 2023-09-12 DIAGNOSIS — N2581 Secondary hyperparathyroidism of renal origin: Secondary | ICD-10-CM | POA: Diagnosis not present

## 2023-09-14 DIAGNOSIS — N2581 Secondary hyperparathyroidism of renal origin: Secondary | ICD-10-CM | POA: Diagnosis not present

## 2023-09-14 DIAGNOSIS — N186 End stage renal disease: Secondary | ICD-10-CM | POA: Diagnosis not present

## 2023-09-14 DIAGNOSIS — Z992 Dependence on renal dialysis: Secondary | ICD-10-CM | POA: Diagnosis not present

## 2023-09-17 DIAGNOSIS — Z992 Dependence on renal dialysis: Secondary | ICD-10-CM | POA: Diagnosis not present

## 2023-09-17 DIAGNOSIS — N2581 Secondary hyperparathyroidism of renal origin: Secondary | ICD-10-CM | POA: Diagnosis not present

## 2023-09-17 DIAGNOSIS — N186 End stage renal disease: Secondary | ICD-10-CM | POA: Diagnosis not present

## 2023-09-19 DIAGNOSIS — N2581 Secondary hyperparathyroidism of renal origin: Secondary | ICD-10-CM | POA: Diagnosis not present

## 2023-09-19 DIAGNOSIS — Z992 Dependence on renal dialysis: Secondary | ICD-10-CM | POA: Diagnosis not present

## 2023-09-19 DIAGNOSIS — N186 End stage renal disease: Secondary | ICD-10-CM | POA: Diagnosis not present

## 2023-09-21 DIAGNOSIS — Z992 Dependence on renal dialysis: Secondary | ICD-10-CM | POA: Diagnosis not present

## 2023-09-21 DIAGNOSIS — N186 End stage renal disease: Secondary | ICD-10-CM | POA: Diagnosis not present

## 2023-09-21 DIAGNOSIS — N2581 Secondary hyperparathyroidism of renal origin: Secondary | ICD-10-CM | POA: Diagnosis not present

## 2023-09-24 DIAGNOSIS — N186 End stage renal disease: Secondary | ICD-10-CM | POA: Diagnosis not present

## 2023-09-24 DIAGNOSIS — N2581 Secondary hyperparathyroidism of renal origin: Secondary | ICD-10-CM | POA: Diagnosis not present

## 2023-09-24 DIAGNOSIS — Z992 Dependence on renal dialysis: Secondary | ICD-10-CM | POA: Diagnosis not present

## 2023-09-26 DIAGNOSIS — N186 End stage renal disease: Secondary | ICD-10-CM | POA: Diagnosis not present

## 2023-09-26 DIAGNOSIS — Z992 Dependence on renal dialysis: Secondary | ICD-10-CM | POA: Diagnosis not present

## 2023-09-26 DIAGNOSIS — N2581 Secondary hyperparathyroidism of renal origin: Secondary | ICD-10-CM | POA: Diagnosis not present

## 2023-09-28 DIAGNOSIS — N186 End stage renal disease: Secondary | ICD-10-CM | POA: Diagnosis not present

## 2023-09-28 DIAGNOSIS — Z992 Dependence on renal dialysis: Secondary | ICD-10-CM | POA: Diagnosis not present

## 2023-09-28 DIAGNOSIS — N2581 Secondary hyperparathyroidism of renal origin: Secondary | ICD-10-CM | POA: Diagnosis not present

## 2023-09-30 DIAGNOSIS — N2581 Secondary hyperparathyroidism of renal origin: Secondary | ICD-10-CM | POA: Diagnosis not present

## 2023-09-30 DIAGNOSIS — Z992 Dependence on renal dialysis: Secondary | ICD-10-CM | POA: Diagnosis not present

## 2023-09-30 DIAGNOSIS — N186 End stage renal disease: Secondary | ICD-10-CM | POA: Diagnosis not present

## 2023-10-02 DIAGNOSIS — N2581 Secondary hyperparathyroidism of renal origin: Secondary | ICD-10-CM | POA: Diagnosis not present

## 2023-10-02 DIAGNOSIS — Z992 Dependence on renal dialysis: Secondary | ICD-10-CM | POA: Diagnosis not present

## 2023-10-02 DIAGNOSIS — N186 End stage renal disease: Secondary | ICD-10-CM | POA: Diagnosis not present

## 2023-10-04 DIAGNOSIS — L720 Epidermal cyst: Secondary | ICD-10-CM | POA: Diagnosis not present

## 2023-10-05 DIAGNOSIS — Z992 Dependence on renal dialysis: Secondary | ICD-10-CM | POA: Diagnosis not present

## 2023-10-05 DIAGNOSIS — N2581 Secondary hyperparathyroidism of renal origin: Secondary | ICD-10-CM | POA: Diagnosis not present

## 2023-10-05 DIAGNOSIS — N186 End stage renal disease: Secondary | ICD-10-CM | POA: Diagnosis not present

## 2023-10-07 DIAGNOSIS — Z992 Dependence on renal dialysis: Secondary | ICD-10-CM | POA: Diagnosis not present

## 2023-10-07 DIAGNOSIS — N2581 Secondary hyperparathyroidism of renal origin: Secondary | ICD-10-CM | POA: Diagnosis not present

## 2023-10-07 DIAGNOSIS — N186 End stage renal disease: Secondary | ICD-10-CM | POA: Diagnosis not present

## 2023-10-09 DIAGNOSIS — Z992 Dependence on renal dialysis: Secondary | ICD-10-CM | POA: Diagnosis not present

## 2023-10-09 DIAGNOSIS — N186 End stage renal disease: Secondary | ICD-10-CM | POA: Diagnosis not present

## 2023-10-09 DIAGNOSIS — N2581 Secondary hyperparathyroidism of renal origin: Secondary | ICD-10-CM | POA: Diagnosis not present

## 2023-10-09 DIAGNOSIS — E1122 Type 2 diabetes mellitus with diabetic chronic kidney disease: Secondary | ICD-10-CM | POA: Diagnosis not present

## 2023-10-11 ENCOUNTER — Ambulatory Visit: Payer: Medicare HMO | Admitting: Podiatry

## 2023-10-12 DIAGNOSIS — N2581 Secondary hyperparathyroidism of renal origin: Secondary | ICD-10-CM | POA: Diagnosis not present

## 2023-10-12 DIAGNOSIS — N186 End stage renal disease: Secondary | ICD-10-CM | POA: Diagnosis not present

## 2023-10-12 DIAGNOSIS — Z992 Dependence on renal dialysis: Secondary | ICD-10-CM | POA: Diagnosis not present

## 2023-10-15 DIAGNOSIS — Z992 Dependence on renal dialysis: Secondary | ICD-10-CM | POA: Diagnosis not present

## 2023-10-15 DIAGNOSIS — N186 End stage renal disease: Secondary | ICD-10-CM | POA: Diagnosis not present

## 2023-10-15 DIAGNOSIS — N2581 Secondary hyperparathyroidism of renal origin: Secondary | ICD-10-CM | POA: Diagnosis not present

## 2023-10-17 DIAGNOSIS — N186 End stage renal disease: Secondary | ICD-10-CM | POA: Diagnosis not present

## 2023-10-17 DIAGNOSIS — Z992 Dependence on renal dialysis: Secondary | ICD-10-CM | POA: Diagnosis not present

## 2023-10-17 DIAGNOSIS — N2581 Secondary hyperparathyroidism of renal origin: Secondary | ICD-10-CM | POA: Diagnosis not present

## 2023-10-19 DIAGNOSIS — N186 End stage renal disease: Secondary | ICD-10-CM | POA: Diagnosis not present

## 2023-10-19 DIAGNOSIS — N2581 Secondary hyperparathyroidism of renal origin: Secondary | ICD-10-CM | POA: Diagnosis not present

## 2023-10-19 DIAGNOSIS — Z992 Dependence on renal dialysis: Secondary | ICD-10-CM | POA: Diagnosis not present

## 2023-10-22 DIAGNOSIS — Z992 Dependence on renal dialysis: Secondary | ICD-10-CM | POA: Diagnosis not present

## 2023-10-22 DIAGNOSIS — N2581 Secondary hyperparathyroidism of renal origin: Secondary | ICD-10-CM | POA: Diagnosis not present

## 2023-10-22 DIAGNOSIS — N186 End stage renal disease: Secondary | ICD-10-CM | POA: Diagnosis not present

## 2023-10-23 DIAGNOSIS — I130 Hypertensive heart and chronic kidney disease with heart failure and stage 1 through stage 4 chronic kidney disease, or unspecified chronic kidney disease: Secondary | ICD-10-CM | POA: Diagnosis not present

## 2023-10-23 DIAGNOSIS — E039 Hypothyroidism, unspecified: Secondary | ICD-10-CM | POA: Diagnosis not present

## 2023-10-23 DIAGNOSIS — N186 End stage renal disease: Secondary | ICD-10-CM | POA: Diagnosis not present

## 2023-10-23 DIAGNOSIS — Z992 Dependence on renal dialysis: Secondary | ICD-10-CM | POA: Diagnosis not present

## 2023-10-23 DIAGNOSIS — I5032 Chronic diastolic (congestive) heart failure: Secondary | ICD-10-CM | POA: Diagnosis not present

## 2023-10-23 DIAGNOSIS — G894 Chronic pain syndrome: Secondary | ICD-10-CM | POA: Diagnosis not present

## 2023-10-23 DIAGNOSIS — E1129 Type 2 diabetes mellitus with other diabetic kidney complication: Secondary | ICD-10-CM | POA: Diagnosis not present

## 2023-10-24 DIAGNOSIS — N186 End stage renal disease: Secondary | ICD-10-CM | POA: Diagnosis not present

## 2023-10-24 DIAGNOSIS — Z992 Dependence on renal dialysis: Secondary | ICD-10-CM | POA: Diagnosis not present

## 2023-10-24 DIAGNOSIS — N2581 Secondary hyperparathyroidism of renal origin: Secondary | ICD-10-CM | POA: Diagnosis not present

## 2023-10-26 DIAGNOSIS — Z992 Dependence on renal dialysis: Secondary | ICD-10-CM | POA: Diagnosis not present

## 2023-10-26 DIAGNOSIS — N2581 Secondary hyperparathyroidism of renal origin: Secondary | ICD-10-CM | POA: Diagnosis not present

## 2023-10-26 DIAGNOSIS — N186 End stage renal disease: Secondary | ICD-10-CM | POA: Diagnosis not present

## 2023-10-29 DIAGNOSIS — Z992 Dependence on renal dialysis: Secondary | ICD-10-CM | POA: Diagnosis not present

## 2023-10-29 DIAGNOSIS — N186 End stage renal disease: Secondary | ICD-10-CM | POA: Diagnosis not present

## 2023-10-29 DIAGNOSIS — N2581 Secondary hyperparathyroidism of renal origin: Secondary | ICD-10-CM | POA: Diagnosis not present

## 2023-10-31 DIAGNOSIS — N186 End stage renal disease: Secondary | ICD-10-CM | POA: Diagnosis not present

## 2023-10-31 DIAGNOSIS — N2581 Secondary hyperparathyroidism of renal origin: Secondary | ICD-10-CM | POA: Diagnosis not present

## 2023-10-31 DIAGNOSIS — Z992 Dependence on renal dialysis: Secondary | ICD-10-CM | POA: Diagnosis not present

## 2023-11-02 DIAGNOSIS — N186 End stage renal disease: Secondary | ICD-10-CM | POA: Diagnosis not present

## 2023-11-02 DIAGNOSIS — N2581 Secondary hyperparathyroidism of renal origin: Secondary | ICD-10-CM | POA: Diagnosis not present

## 2023-11-02 DIAGNOSIS — Z992 Dependence on renal dialysis: Secondary | ICD-10-CM | POA: Diagnosis not present

## 2023-11-05 DIAGNOSIS — N2581 Secondary hyperparathyroidism of renal origin: Secondary | ICD-10-CM | POA: Diagnosis not present

## 2023-11-05 DIAGNOSIS — Z992 Dependence on renal dialysis: Secondary | ICD-10-CM | POA: Diagnosis not present

## 2023-11-05 DIAGNOSIS — N186 End stage renal disease: Secondary | ICD-10-CM | POA: Diagnosis not present

## 2023-11-06 ENCOUNTER — Encounter: Payer: Self-pay | Admitting: Podiatry

## 2023-11-06 ENCOUNTER — Ambulatory Visit (INDEPENDENT_AMBULATORY_CARE_PROVIDER_SITE_OTHER): Payer: Medicare HMO | Admitting: Podiatry

## 2023-11-06 DIAGNOSIS — M79674 Pain in right toe(s): Secondary | ICD-10-CM

## 2023-11-06 DIAGNOSIS — B351 Tinea unguium: Secondary | ICD-10-CM | POA: Diagnosis not present

## 2023-11-06 DIAGNOSIS — E118 Type 2 diabetes mellitus with unspecified complications: Secondary | ICD-10-CM

## 2023-11-06 DIAGNOSIS — M79675 Pain in left toe(s): Secondary | ICD-10-CM

## 2023-11-06 NOTE — Progress Notes (Signed)
This patient returns to my office for at risk foot care.  This patient requires this care by a professional since this patient will be at risk due to having CKD, and type 2 diabetes  This patient is unable to cut nails himself since the patient cannot reach his nails.These nails are painful walking and wearing shoes.  This patient presents for at risk foot care today.  General Appearance  Alert, conversant and in no acute stress.  Vascular  Dorsalis pedis and posterior tibial  pulses are weakly palpable  bilaterally.  Capillary return is within normal limits  bilaterally. Temperature is within normal limits  bilaterally.  Neurologic  Senn-Weinstein monofilament wire test within normal limits  bilaterally. Muscle power within normal limits bilaterally.  Nails Thick disfigured discolored nails with subungual debris  from hallux to fifth toes bilaterally. No evidence of bacterial infection or drainage bilaterally.  Orthopedic  No limitations of motion  feet .  No crepitus or effusions noted.  No bony pathology or digital deformities noted. HAV  B/L.  Skin  normotropic skin with no porokeratosis noted bilaterally.  No signs of infections or ulcers noted.     Onychomycosis  Pain in right toes  Pain in left toes  Consent was obtained for treatment procedures.   Mechanical debridement of nails 1-5  bilaterally performed with a nail nipper.  Filed with dremel without incident.    Return office visit    3 months                 Told patient to return for periodic foot care and evaluation due to potential at risk complications.   Helane Gunther DPM

## 2023-11-07 DIAGNOSIS — N2581 Secondary hyperparathyroidism of renal origin: Secondary | ICD-10-CM | POA: Diagnosis not present

## 2023-11-07 DIAGNOSIS — Z992 Dependence on renal dialysis: Secondary | ICD-10-CM | POA: Diagnosis not present

## 2023-11-07 DIAGNOSIS — N186 End stage renal disease: Secondary | ICD-10-CM | POA: Diagnosis not present

## 2023-11-09 DIAGNOSIS — Z992 Dependence on renal dialysis: Secondary | ICD-10-CM | POA: Diagnosis not present

## 2023-11-09 DIAGNOSIS — N2581 Secondary hyperparathyroidism of renal origin: Secondary | ICD-10-CM | POA: Diagnosis not present

## 2023-11-09 DIAGNOSIS — E1122 Type 2 diabetes mellitus with diabetic chronic kidney disease: Secondary | ICD-10-CM | POA: Diagnosis not present

## 2023-11-09 DIAGNOSIS — N186 End stage renal disease: Secondary | ICD-10-CM | POA: Diagnosis not present

## 2023-11-12 DIAGNOSIS — Z992 Dependence on renal dialysis: Secondary | ICD-10-CM | POA: Diagnosis not present

## 2023-11-12 DIAGNOSIS — N2581 Secondary hyperparathyroidism of renal origin: Secondary | ICD-10-CM | POA: Diagnosis not present

## 2023-11-12 DIAGNOSIS — N186 End stage renal disease: Secondary | ICD-10-CM | POA: Diagnosis not present

## 2023-11-13 DIAGNOSIS — N186 End stage renal disease: Secondary | ICD-10-CM | POA: Diagnosis not present

## 2023-11-13 DIAGNOSIS — Z992 Dependence on renal dialysis: Secondary | ICD-10-CM | POA: Diagnosis not present

## 2023-11-13 DIAGNOSIS — N2581 Secondary hyperparathyroidism of renal origin: Secondary | ICD-10-CM | POA: Diagnosis not present

## 2023-11-14 DIAGNOSIS — Z992 Dependence on renal dialysis: Secondary | ICD-10-CM | POA: Diagnosis not present

## 2023-11-14 DIAGNOSIS — N186 End stage renal disease: Secondary | ICD-10-CM | POA: Diagnosis not present

## 2023-11-14 DIAGNOSIS — N2581 Secondary hyperparathyroidism of renal origin: Secondary | ICD-10-CM | POA: Diagnosis not present

## 2023-11-16 DIAGNOSIS — N2581 Secondary hyperparathyroidism of renal origin: Secondary | ICD-10-CM | POA: Diagnosis not present

## 2023-11-16 DIAGNOSIS — N186 End stage renal disease: Secondary | ICD-10-CM | POA: Diagnosis not present

## 2023-11-16 DIAGNOSIS — Z992 Dependence on renal dialysis: Secondary | ICD-10-CM | POA: Diagnosis not present

## 2023-11-19 DIAGNOSIS — N2581 Secondary hyperparathyroidism of renal origin: Secondary | ICD-10-CM | POA: Diagnosis not present

## 2023-11-19 DIAGNOSIS — Z992 Dependence on renal dialysis: Secondary | ICD-10-CM | POA: Diagnosis not present

## 2023-11-19 DIAGNOSIS — N186 End stage renal disease: Secondary | ICD-10-CM | POA: Diagnosis not present

## 2023-11-21 DIAGNOSIS — N2581 Secondary hyperparathyroidism of renal origin: Secondary | ICD-10-CM | POA: Diagnosis not present

## 2023-11-21 DIAGNOSIS — Z992 Dependence on renal dialysis: Secondary | ICD-10-CM | POA: Diagnosis not present

## 2023-11-21 DIAGNOSIS — N186 End stage renal disease: Secondary | ICD-10-CM | POA: Diagnosis not present

## 2023-11-23 DIAGNOSIS — N2581 Secondary hyperparathyroidism of renal origin: Secondary | ICD-10-CM | POA: Diagnosis not present

## 2023-11-23 DIAGNOSIS — N186 End stage renal disease: Secondary | ICD-10-CM | POA: Diagnosis not present

## 2023-11-23 DIAGNOSIS — Z992 Dependence on renal dialysis: Secondary | ICD-10-CM | POA: Diagnosis not present

## 2023-11-26 DIAGNOSIS — N2581 Secondary hyperparathyroidism of renal origin: Secondary | ICD-10-CM | POA: Diagnosis not present

## 2023-11-26 DIAGNOSIS — N186 End stage renal disease: Secondary | ICD-10-CM | POA: Diagnosis not present

## 2023-11-26 DIAGNOSIS — Z992 Dependence on renal dialysis: Secondary | ICD-10-CM | POA: Diagnosis not present

## 2023-11-28 DIAGNOSIS — Z992 Dependence on renal dialysis: Secondary | ICD-10-CM | POA: Diagnosis not present

## 2023-11-28 DIAGNOSIS — N186 End stage renal disease: Secondary | ICD-10-CM | POA: Diagnosis not present

## 2023-11-28 DIAGNOSIS — N2581 Secondary hyperparathyroidism of renal origin: Secondary | ICD-10-CM | POA: Diagnosis not present

## 2023-11-30 DIAGNOSIS — N2581 Secondary hyperparathyroidism of renal origin: Secondary | ICD-10-CM | POA: Diagnosis not present

## 2023-11-30 DIAGNOSIS — N186 End stage renal disease: Secondary | ICD-10-CM | POA: Diagnosis not present

## 2023-11-30 DIAGNOSIS — Z992 Dependence on renal dialysis: Secondary | ICD-10-CM | POA: Diagnosis not present

## 2023-12-03 DIAGNOSIS — Z992 Dependence on renal dialysis: Secondary | ICD-10-CM | POA: Diagnosis not present

## 2023-12-03 DIAGNOSIS — N2581 Secondary hyperparathyroidism of renal origin: Secondary | ICD-10-CM | POA: Diagnosis not present

## 2023-12-03 DIAGNOSIS — N186 End stage renal disease: Secondary | ICD-10-CM | POA: Diagnosis not present

## 2023-12-05 DIAGNOSIS — N186 End stage renal disease: Secondary | ICD-10-CM | POA: Diagnosis not present

## 2023-12-05 DIAGNOSIS — N2581 Secondary hyperparathyroidism of renal origin: Secondary | ICD-10-CM | POA: Diagnosis not present

## 2023-12-05 DIAGNOSIS — Z992 Dependence on renal dialysis: Secondary | ICD-10-CM | POA: Diagnosis not present

## 2023-12-07 DIAGNOSIS — N2581 Secondary hyperparathyroidism of renal origin: Secondary | ICD-10-CM | POA: Diagnosis not present

## 2023-12-07 DIAGNOSIS — N186 End stage renal disease: Secondary | ICD-10-CM | POA: Diagnosis not present

## 2023-12-07 DIAGNOSIS — Z992 Dependence on renal dialysis: Secondary | ICD-10-CM | POA: Diagnosis not present

## 2023-12-07 DIAGNOSIS — E1122 Type 2 diabetes mellitus with diabetic chronic kidney disease: Secondary | ICD-10-CM | POA: Diagnosis not present

## 2023-12-10 DIAGNOSIS — Z992 Dependence on renal dialysis: Secondary | ICD-10-CM | POA: Diagnosis not present

## 2023-12-10 DIAGNOSIS — N2581 Secondary hyperparathyroidism of renal origin: Secondary | ICD-10-CM | POA: Diagnosis not present

## 2023-12-10 DIAGNOSIS — N186 End stage renal disease: Secondary | ICD-10-CM | POA: Diagnosis not present

## 2023-12-12 DIAGNOSIS — N2581 Secondary hyperparathyroidism of renal origin: Secondary | ICD-10-CM | POA: Diagnosis not present

## 2023-12-12 DIAGNOSIS — N186 End stage renal disease: Secondary | ICD-10-CM | POA: Diagnosis not present

## 2023-12-12 DIAGNOSIS — Z992 Dependence on renal dialysis: Secondary | ICD-10-CM | POA: Diagnosis not present

## 2023-12-14 DIAGNOSIS — N2581 Secondary hyperparathyroidism of renal origin: Secondary | ICD-10-CM | POA: Diagnosis not present

## 2023-12-14 DIAGNOSIS — Z992 Dependence on renal dialysis: Secondary | ICD-10-CM | POA: Diagnosis not present

## 2023-12-14 DIAGNOSIS — N186 End stage renal disease: Secondary | ICD-10-CM | POA: Diagnosis not present

## 2023-12-17 DIAGNOSIS — N2581 Secondary hyperparathyroidism of renal origin: Secondary | ICD-10-CM | POA: Diagnosis not present

## 2023-12-17 DIAGNOSIS — Z992 Dependence on renal dialysis: Secondary | ICD-10-CM | POA: Diagnosis not present

## 2023-12-17 DIAGNOSIS — N186 End stage renal disease: Secondary | ICD-10-CM | POA: Diagnosis not present

## 2023-12-19 DIAGNOSIS — N2581 Secondary hyperparathyroidism of renal origin: Secondary | ICD-10-CM | POA: Diagnosis not present

## 2023-12-19 DIAGNOSIS — Z992 Dependence on renal dialysis: Secondary | ICD-10-CM | POA: Diagnosis not present

## 2023-12-19 DIAGNOSIS — N186 End stage renal disease: Secondary | ICD-10-CM | POA: Diagnosis not present

## 2023-12-21 DIAGNOSIS — N186 End stage renal disease: Secondary | ICD-10-CM | POA: Diagnosis not present

## 2023-12-21 DIAGNOSIS — Z992 Dependence on renal dialysis: Secondary | ICD-10-CM | POA: Diagnosis not present

## 2023-12-21 DIAGNOSIS — N2581 Secondary hyperparathyroidism of renal origin: Secondary | ICD-10-CM | POA: Diagnosis not present

## 2023-12-24 DIAGNOSIS — N2581 Secondary hyperparathyroidism of renal origin: Secondary | ICD-10-CM | POA: Diagnosis not present

## 2023-12-24 DIAGNOSIS — Z992 Dependence on renal dialysis: Secondary | ICD-10-CM | POA: Diagnosis not present

## 2023-12-24 DIAGNOSIS — N186 End stage renal disease: Secondary | ICD-10-CM | POA: Diagnosis not present

## 2023-12-26 DIAGNOSIS — N2581 Secondary hyperparathyroidism of renal origin: Secondary | ICD-10-CM | POA: Diagnosis not present

## 2023-12-26 DIAGNOSIS — Z992 Dependence on renal dialysis: Secondary | ICD-10-CM | POA: Diagnosis not present

## 2023-12-26 DIAGNOSIS — N186 End stage renal disease: Secondary | ICD-10-CM | POA: Diagnosis not present

## 2023-12-28 DIAGNOSIS — N2581 Secondary hyperparathyroidism of renal origin: Secondary | ICD-10-CM | POA: Diagnosis not present

## 2023-12-28 DIAGNOSIS — Z992 Dependence on renal dialysis: Secondary | ICD-10-CM | POA: Diagnosis not present

## 2023-12-28 DIAGNOSIS — N186 End stage renal disease: Secondary | ICD-10-CM | POA: Diagnosis not present

## 2023-12-31 DIAGNOSIS — N186 End stage renal disease: Secondary | ICD-10-CM | POA: Diagnosis not present

## 2023-12-31 DIAGNOSIS — N2581 Secondary hyperparathyroidism of renal origin: Secondary | ICD-10-CM | POA: Diagnosis not present

## 2023-12-31 DIAGNOSIS — Z992 Dependence on renal dialysis: Secondary | ICD-10-CM | POA: Diagnosis not present

## 2024-01-01 DIAGNOSIS — Z992 Dependence on renal dialysis: Secondary | ICD-10-CM | POA: Diagnosis not present

## 2024-01-01 DIAGNOSIS — N186 End stage renal disease: Secondary | ICD-10-CM | POA: Diagnosis not present

## 2024-01-01 DIAGNOSIS — N2581 Secondary hyperparathyroidism of renal origin: Secondary | ICD-10-CM | POA: Diagnosis not present

## 2024-01-02 DIAGNOSIS — Z992 Dependence on renal dialysis: Secondary | ICD-10-CM | POA: Diagnosis not present

## 2024-01-02 DIAGNOSIS — N186 End stage renal disease: Secondary | ICD-10-CM | POA: Diagnosis not present

## 2024-01-02 DIAGNOSIS — N2581 Secondary hyperparathyroidism of renal origin: Secondary | ICD-10-CM | POA: Diagnosis not present

## 2024-01-04 DIAGNOSIS — Z992 Dependence on renal dialysis: Secondary | ICD-10-CM | POA: Diagnosis not present

## 2024-01-04 DIAGNOSIS — N186 End stage renal disease: Secondary | ICD-10-CM | POA: Diagnosis not present

## 2024-01-04 DIAGNOSIS — N2581 Secondary hyperparathyroidism of renal origin: Secondary | ICD-10-CM | POA: Diagnosis not present

## 2024-01-07 DIAGNOSIS — N2581 Secondary hyperparathyroidism of renal origin: Secondary | ICD-10-CM | POA: Diagnosis not present

## 2024-01-07 DIAGNOSIS — E1122 Type 2 diabetes mellitus with diabetic chronic kidney disease: Secondary | ICD-10-CM | POA: Diagnosis not present

## 2024-01-07 DIAGNOSIS — N186 End stage renal disease: Secondary | ICD-10-CM | POA: Diagnosis not present

## 2024-01-07 DIAGNOSIS — Z992 Dependence on renal dialysis: Secondary | ICD-10-CM | POA: Diagnosis not present

## 2024-01-09 DIAGNOSIS — N186 End stage renal disease: Secondary | ICD-10-CM | POA: Diagnosis not present

## 2024-01-09 DIAGNOSIS — Z992 Dependence on renal dialysis: Secondary | ICD-10-CM | POA: Diagnosis not present

## 2024-01-09 DIAGNOSIS — N2581 Secondary hyperparathyroidism of renal origin: Secondary | ICD-10-CM | POA: Diagnosis not present

## 2024-01-11 DIAGNOSIS — N2581 Secondary hyperparathyroidism of renal origin: Secondary | ICD-10-CM | POA: Diagnosis not present

## 2024-01-11 DIAGNOSIS — N186 End stage renal disease: Secondary | ICD-10-CM | POA: Diagnosis not present

## 2024-01-11 DIAGNOSIS — Z992 Dependence on renal dialysis: Secondary | ICD-10-CM | POA: Diagnosis not present

## 2024-01-14 DIAGNOSIS — Z992 Dependence on renal dialysis: Secondary | ICD-10-CM | POA: Diagnosis not present

## 2024-01-14 DIAGNOSIS — N2581 Secondary hyperparathyroidism of renal origin: Secondary | ICD-10-CM | POA: Diagnosis not present

## 2024-01-14 DIAGNOSIS — N186 End stage renal disease: Secondary | ICD-10-CM | POA: Diagnosis not present

## 2024-01-16 DIAGNOSIS — Z992 Dependence on renal dialysis: Secondary | ICD-10-CM | POA: Diagnosis not present

## 2024-01-16 DIAGNOSIS — N186 End stage renal disease: Secondary | ICD-10-CM | POA: Diagnosis not present

## 2024-01-16 DIAGNOSIS — N2581 Secondary hyperparathyroidism of renal origin: Secondary | ICD-10-CM | POA: Diagnosis not present

## 2024-01-18 DIAGNOSIS — Z992 Dependence on renal dialysis: Secondary | ICD-10-CM | POA: Diagnosis not present

## 2024-01-18 DIAGNOSIS — N186 End stage renal disease: Secondary | ICD-10-CM | POA: Diagnosis not present

## 2024-01-18 DIAGNOSIS — N2581 Secondary hyperparathyroidism of renal origin: Secondary | ICD-10-CM | POA: Diagnosis not present

## 2024-01-21 DIAGNOSIS — N2581 Secondary hyperparathyroidism of renal origin: Secondary | ICD-10-CM | POA: Diagnosis not present

## 2024-01-21 DIAGNOSIS — N186 End stage renal disease: Secondary | ICD-10-CM | POA: Diagnosis not present

## 2024-01-21 DIAGNOSIS — Z992 Dependence on renal dialysis: Secondary | ICD-10-CM | POA: Diagnosis not present

## 2024-01-22 DIAGNOSIS — I5032 Chronic diastolic (congestive) heart failure: Secondary | ICD-10-CM | POA: Diagnosis not present

## 2024-01-22 DIAGNOSIS — E291 Testicular hypofunction: Secondary | ICD-10-CM | POA: Diagnosis not present

## 2024-01-22 DIAGNOSIS — E785 Hyperlipidemia, unspecified: Secondary | ICD-10-CM | POA: Diagnosis not present

## 2024-01-22 DIAGNOSIS — E7849 Other hyperlipidemia: Secondary | ICD-10-CM | POA: Diagnosis not present

## 2024-01-22 DIAGNOSIS — D631 Anemia in chronic kidney disease: Secondary | ICD-10-CM | POA: Diagnosis not present

## 2024-01-22 DIAGNOSIS — I132 Hypertensive heart and chronic kidney disease with heart failure and with stage 5 chronic kidney disease, or end stage renal disease: Secondary | ICD-10-CM | POA: Diagnosis not present

## 2024-01-22 DIAGNOSIS — N186 End stage renal disease: Secondary | ICD-10-CM | POA: Diagnosis not present

## 2024-01-22 DIAGNOSIS — Z125 Encounter for screening for malignant neoplasm of prostate: Secondary | ICD-10-CM | POA: Diagnosis not present

## 2024-01-22 DIAGNOSIS — M109 Gout, unspecified: Secondary | ICD-10-CM | POA: Diagnosis not present

## 2024-01-22 DIAGNOSIS — E1129 Type 2 diabetes mellitus with other diabetic kidney complication: Secondary | ICD-10-CM | POA: Diagnosis not present

## 2024-01-22 DIAGNOSIS — E039 Hypothyroidism, unspecified: Secondary | ICD-10-CM | POA: Diagnosis not present

## 2024-01-23 DIAGNOSIS — Z992 Dependence on renal dialysis: Secondary | ICD-10-CM | POA: Diagnosis not present

## 2024-01-23 DIAGNOSIS — N2581 Secondary hyperparathyroidism of renal origin: Secondary | ICD-10-CM | POA: Diagnosis not present

## 2024-01-23 DIAGNOSIS — N186 End stage renal disease: Secondary | ICD-10-CM | POA: Diagnosis not present

## 2024-01-25 DIAGNOSIS — N186 End stage renal disease: Secondary | ICD-10-CM | POA: Diagnosis not present

## 2024-01-25 DIAGNOSIS — Z992 Dependence on renal dialysis: Secondary | ICD-10-CM | POA: Diagnosis not present

## 2024-01-25 DIAGNOSIS — N2581 Secondary hyperparathyroidism of renal origin: Secondary | ICD-10-CM | POA: Diagnosis not present

## 2024-01-28 DIAGNOSIS — Z992 Dependence on renal dialysis: Secondary | ICD-10-CM | POA: Diagnosis not present

## 2024-01-28 DIAGNOSIS — N2581 Secondary hyperparathyroidism of renal origin: Secondary | ICD-10-CM | POA: Diagnosis not present

## 2024-01-28 DIAGNOSIS — N186 End stage renal disease: Secondary | ICD-10-CM | POA: Diagnosis not present

## 2024-01-29 DIAGNOSIS — R82998 Other abnormal findings in urine: Secondary | ICD-10-CM | POA: Diagnosis not present

## 2024-01-29 DIAGNOSIS — E1129 Type 2 diabetes mellitus with other diabetic kidney complication: Secondary | ICD-10-CM | POA: Diagnosis not present

## 2024-01-29 DIAGNOSIS — L942 Calcinosis cutis: Secondary | ICD-10-CM | POA: Diagnosis not present

## 2024-01-29 DIAGNOSIS — D631 Anemia in chronic kidney disease: Secondary | ICD-10-CM | POA: Diagnosis not present

## 2024-01-29 DIAGNOSIS — Z1339 Encounter for screening examination for other mental health and behavioral disorders: Secondary | ICD-10-CM | POA: Diagnosis not present

## 2024-01-29 DIAGNOSIS — I130 Hypertensive heart and chronic kidney disease with heart failure and stage 1 through stage 4 chronic kidney disease, or unspecified chronic kidney disease: Secondary | ICD-10-CM | POA: Diagnosis not present

## 2024-01-29 DIAGNOSIS — M1712 Unilateral primary osteoarthritis, left knee: Secondary | ICD-10-CM | POA: Diagnosis not present

## 2024-01-29 DIAGNOSIS — N186 End stage renal disease: Secondary | ICD-10-CM | POA: Diagnosis not present

## 2024-01-29 DIAGNOSIS — I5032 Chronic diastolic (congestive) heart failure: Secondary | ICD-10-CM | POA: Diagnosis not present

## 2024-01-29 DIAGNOSIS — Z0000 Encounter for general adult medical examination without abnormal findings: Secondary | ICD-10-CM | POA: Diagnosis not present

## 2024-01-29 DIAGNOSIS — Z1331 Encounter for screening for depression: Secondary | ICD-10-CM | POA: Diagnosis not present

## 2024-01-30 DIAGNOSIS — Z992 Dependence on renal dialysis: Secondary | ICD-10-CM | POA: Diagnosis not present

## 2024-01-30 DIAGNOSIS — N186 End stage renal disease: Secondary | ICD-10-CM | POA: Diagnosis not present

## 2024-01-30 DIAGNOSIS — N2581 Secondary hyperparathyroidism of renal origin: Secondary | ICD-10-CM | POA: Diagnosis not present

## 2024-02-01 DIAGNOSIS — Z992 Dependence on renal dialysis: Secondary | ICD-10-CM | POA: Diagnosis not present

## 2024-02-01 DIAGNOSIS — N2581 Secondary hyperparathyroidism of renal origin: Secondary | ICD-10-CM | POA: Diagnosis not present

## 2024-02-01 DIAGNOSIS — N186 End stage renal disease: Secondary | ICD-10-CM | POA: Diagnosis not present

## 2024-02-04 DIAGNOSIS — Z992 Dependence on renal dialysis: Secondary | ICD-10-CM | POA: Diagnosis not present

## 2024-02-04 DIAGNOSIS — N2581 Secondary hyperparathyroidism of renal origin: Secondary | ICD-10-CM | POA: Diagnosis not present

## 2024-02-04 DIAGNOSIS — N186 End stage renal disease: Secondary | ICD-10-CM | POA: Diagnosis not present

## 2024-02-05 ENCOUNTER — Ambulatory Visit: Payer: Medicare HMO | Admitting: Podiatry

## 2024-02-05 ENCOUNTER — Encounter: Payer: Self-pay | Admitting: Podiatry

## 2024-02-05 DIAGNOSIS — M79674 Pain in right toe(s): Secondary | ICD-10-CM | POA: Diagnosis not present

## 2024-02-05 DIAGNOSIS — B351 Tinea unguium: Secondary | ICD-10-CM | POA: Diagnosis not present

## 2024-02-05 DIAGNOSIS — M79675 Pain in left toe(s): Secondary | ICD-10-CM

## 2024-02-05 DIAGNOSIS — E1122 Type 2 diabetes mellitus with diabetic chronic kidney disease: Secondary | ICD-10-CM | POA: Diagnosis not present

## 2024-02-05 DIAGNOSIS — N184 Chronic kidney disease, stage 4 (severe): Secondary | ICD-10-CM

## 2024-02-05 DIAGNOSIS — E118 Type 2 diabetes mellitus with unspecified complications: Secondary | ICD-10-CM

## 2024-02-05 NOTE — Progress Notes (Signed)
 This patient returns to my office for at risk foot care.  This patient requires this care by a professional since this patient will be at risk due to having CKD, and type 2 diabetes  This patient is unable to cut nails himself since the patient cannot reach his nails.These nails are painful walking and wearing shoes.  This patient presents for at risk foot care today.  General Appearance  Alert, conversant and in no acute stress.  Vascular  Dorsalis pedis and posterior tibial  pulses are weakly palpable  bilaterally.  Capillary return is within normal limits  bilaterally. Temperature is within normal limits  bilaterally.  Neurologic  Senn-Weinstein monofilament wire test within normal limits  bilaterally. Muscle power within normal limits bilaterally.  Nails Thick disfigured discolored nails with subungual debris  from hallux to fifth toes bilaterally. No evidence of bacterial infection or drainage bilaterally.  Orthopedic  No limitations of motion  feet .  No crepitus or effusions noted.  No bony pathology or digital deformities noted. HAV  B/L.  Skin  normotropic skin with no porokeratosis noted bilaterally.  No signs of infections or ulcers noted.     Onychomycosis  Pain in right toes  Pain in left toes  Consent was obtained for treatment procedures.   Mechanical debridement of nails 1-5  bilaterally performed with a nail nipper.  Filed with dremel without incident.    Return office visit    3 months                 Told patient to return for periodic foot care and evaluation due to potential at risk complications.   Helane Gunther DPM

## 2024-02-06 DIAGNOSIS — Z992 Dependence on renal dialysis: Secondary | ICD-10-CM | POA: Diagnosis not present

## 2024-02-06 DIAGNOSIS — N186 End stage renal disease: Secondary | ICD-10-CM | POA: Diagnosis not present

## 2024-02-06 DIAGNOSIS — E1122 Type 2 diabetes mellitus with diabetic chronic kidney disease: Secondary | ICD-10-CM | POA: Diagnosis not present

## 2024-02-06 DIAGNOSIS — N2581 Secondary hyperparathyroidism of renal origin: Secondary | ICD-10-CM | POA: Diagnosis not present

## 2024-02-08 DIAGNOSIS — N2581 Secondary hyperparathyroidism of renal origin: Secondary | ICD-10-CM | POA: Diagnosis not present

## 2024-02-08 DIAGNOSIS — Z992 Dependence on renal dialysis: Secondary | ICD-10-CM | POA: Diagnosis not present

## 2024-02-08 DIAGNOSIS — N186 End stage renal disease: Secondary | ICD-10-CM | POA: Diagnosis not present

## 2024-02-11 DIAGNOSIS — Z992 Dependence on renal dialysis: Secondary | ICD-10-CM | POA: Diagnosis not present

## 2024-02-11 DIAGNOSIS — N2581 Secondary hyperparathyroidism of renal origin: Secondary | ICD-10-CM | POA: Diagnosis not present

## 2024-02-11 DIAGNOSIS — N186 End stage renal disease: Secondary | ICD-10-CM | POA: Diagnosis not present

## 2024-02-12 DIAGNOSIS — N2581 Secondary hyperparathyroidism of renal origin: Secondary | ICD-10-CM | POA: Diagnosis not present

## 2024-02-12 DIAGNOSIS — Z992 Dependence on renal dialysis: Secondary | ICD-10-CM | POA: Diagnosis not present

## 2024-02-12 DIAGNOSIS — N186 End stage renal disease: Secondary | ICD-10-CM | POA: Diagnosis not present

## 2024-02-13 DIAGNOSIS — Z992 Dependence on renal dialysis: Secondary | ICD-10-CM | POA: Diagnosis not present

## 2024-02-13 DIAGNOSIS — N186 End stage renal disease: Secondary | ICD-10-CM | POA: Diagnosis not present

## 2024-02-13 DIAGNOSIS — N2581 Secondary hyperparathyroidism of renal origin: Secondary | ICD-10-CM | POA: Diagnosis not present

## 2024-02-15 DIAGNOSIS — N186 End stage renal disease: Secondary | ICD-10-CM | POA: Diagnosis not present

## 2024-02-15 DIAGNOSIS — N2581 Secondary hyperparathyroidism of renal origin: Secondary | ICD-10-CM | POA: Diagnosis not present

## 2024-02-15 DIAGNOSIS — Z992 Dependence on renal dialysis: Secondary | ICD-10-CM | POA: Diagnosis not present

## 2024-02-18 DIAGNOSIS — N186 End stage renal disease: Secondary | ICD-10-CM | POA: Diagnosis not present

## 2024-02-18 DIAGNOSIS — Z992 Dependence on renal dialysis: Secondary | ICD-10-CM | POA: Diagnosis not present

## 2024-02-18 DIAGNOSIS — N2581 Secondary hyperparathyroidism of renal origin: Secondary | ICD-10-CM | POA: Diagnosis not present

## 2024-02-20 DIAGNOSIS — N2581 Secondary hyperparathyroidism of renal origin: Secondary | ICD-10-CM | POA: Diagnosis not present

## 2024-02-20 DIAGNOSIS — Z992 Dependence on renal dialysis: Secondary | ICD-10-CM | POA: Diagnosis not present

## 2024-02-20 DIAGNOSIS — N186 End stage renal disease: Secondary | ICD-10-CM | POA: Diagnosis not present

## 2024-02-22 DIAGNOSIS — N2581 Secondary hyperparathyroidism of renal origin: Secondary | ICD-10-CM | POA: Diagnosis not present

## 2024-02-22 DIAGNOSIS — N186 End stage renal disease: Secondary | ICD-10-CM | POA: Diagnosis not present

## 2024-02-22 DIAGNOSIS — Z992 Dependence on renal dialysis: Secondary | ICD-10-CM | POA: Diagnosis not present

## 2024-02-25 DIAGNOSIS — N2581 Secondary hyperparathyroidism of renal origin: Secondary | ICD-10-CM | POA: Diagnosis not present

## 2024-02-25 DIAGNOSIS — N186 End stage renal disease: Secondary | ICD-10-CM | POA: Diagnosis not present

## 2024-02-25 DIAGNOSIS — Z992 Dependence on renal dialysis: Secondary | ICD-10-CM | POA: Diagnosis not present

## 2024-02-27 DIAGNOSIS — N186 End stage renal disease: Secondary | ICD-10-CM | POA: Diagnosis not present

## 2024-02-27 DIAGNOSIS — N2581 Secondary hyperparathyroidism of renal origin: Secondary | ICD-10-CM | POA: Diagnosis not present

## 2024-02-27 DIAGNOSIS — Z992 Dependence on renal dialysis: Secondary | ICD-10-CM | POA: Diagnosis not present

## 2024-02-29 ENCOUNTER — Ambulatory Visit: Payer: Self-pay

## 2024-02-29 DIAGNOSIS — N186 End stage renal disease: Secondary | ICD-10-CM | POA: Diagnosis not present

## 2024-02-29 DIAGNOSIS — Z992 Dependence on renal dialysis: Secondary | ICD-10-CM | POA: Diagnosis not present

## 2024-02-29 DIAGNOSIS — N2581 Secondary hyperparathyroidism of renal origin: Secondary | ICD-10-CM | POA: Diagnosis not present

## 2024-02-29 NOTE — Telephone Encounter (Signed)
 Pt PCP not with E2C2 program so advised [facility] to transfer pt to PCP office  Copied from CRM [id number]. Topic: Clinical - Prescription Issue >> Feb 29, 2024  2:03 PM David Nguyen wrote: Reason for CRM: Patient called in about midodrine (PROAMATINE) 5 MG tablet, he has to take 3 tablets before dialysis and 3 after dialysis 3 days a week because his blood pressure drops during dialysis. He needs to prescribed more pills. He has dialysis on Monday and only has 4 pills left, which is enough for Monday. The dialysis doctor said he needs more pills prescribed.

## 2024-03-03 DIAGNOSIS — N186 End stage renal disease: Secondary | ICD-10-CM | POA: Diagnosis not present

## 2024-03-03 DIAGNOSIS — Z992 Dependence on renal dialysis: Secondary | ICD-10-CM | POA: Diagnosis not present

## 2024-03-03 DIAGNOSIS — N2581 Secondary hyperparathyroidism of renal origin: Secondary | ICD-10-CM | POA: Diagnosis not present

## 2024-03-05 DIAGNOSIS — N2581 Secondary hyperparathyroidism of renal origin: Secondary | ICD-10-CM | POA: Diagnosis not present

## 2024-03-05 DIAGNOSIS — N186 End stage renal disease: Secondary | ICD-10-CM | POA: Diagnosis not present

## 2024-03-05 DIAGNOSIS — Z992 Dependence on renal dialysis: Secondary | ICD-10-CM | POA: Diagnosis not present

## 2024-03-07 DIAGNOSIS — N186 End stage renal disease: Secondary | ICD-10-CM | POA: Diagnosis not present

## 2024-03-07 DIAGNOSIS — Z992 Dependence on renal dialysis: Secondary | ICD-10-CM | POA: Diagnosis not present

## 2024-03-07 DIAGNOSIS — N2581 Secondary hyperparathyroidism of renal origin: Secondary | ICD-10-CM | POA: Diagnosis not present

## 2024-03-08 DIAGNOSIS — E1122 Type 2 diabetes mellitus with diabetic chronic kidney disease: Secondary | ICD-10-CM | POA: Diagnosis not present

## 2024-03-08 DIAGNOSIS — N186 End stage renal disease: Secondary | ICD-10-CM | POA: Diagnosis not present

## 2024-03-08 DIAGNOSIS — Z992 Dependence on renal dialysis: Secondary | ICD-10-CM | POA: Diagnosis not present

## 2024-03-10 DIAGNOSIS — N2581 Secondary hyperparathyroidism of renal origin: Secondary | ICD-10-CM | POA: Diagnosis not present

## 2024-03-10 DIAGNOSIS — Z992 Dependence on renal dialysis: Secondary | ICD-10-CM | POA: Diagnosis not present

## 2024-03-10 DIAGNOSIS — N186 End stage renal disease: Secondary | ICD-10-CM | POA: Diagnosis not present

## 2024-03-12 DIAGNOSIS — Z992 Dependence on renal dialysis: Secondary | ICD-10-CM | POA: Diagnosis not present

## 2024-03-12 DIAGNOSIS — N2581 Secondary hyperparathyroidism of renal origin: Secondary | ICD-10-CM | POA: Diagnosis not present

## 2024-03-12 DIAGNOSIS — N186 End stage renal disease: Secondary | ICD-10-CM | POA: Diagnosis not present

## 2024-03-14 DIAGNOSIS — N2581 Secondary hyperparathyroidism of renal origin: Secondary | ICD-10-CM | POA: Diagnosis not present

## 2024-03-14 DIAGNOSIS — Z992 Dependence on renal dialysis: Secondary | ICD-10-CM | POA: Diagnosis not present

## 2024-03-14 DIAGNOSIS — N186 End stage renal disease: Secondary | ICD-10-CM | POA: Diagnosis not present

## 2024-03-17 DIAGNOSIS — N186 End stage renal disease: Secondary | ICD-10-CM | POA: Diagnosis not present

## 2024-03-17 DIAGNOSIS — Z992 Dependence on renal dialysis: Secondary | ICD-10-CM | POA: Diagnosis not present

## 2024-03-17 DIAGNOSIS — N2581 Secondary hyperparathyroidism of renal origin: Secondary | ICD-10-CM | POA: Diagnosis not present

## 2024-03-19 DIAGNOSIS — N186 End stage renal disease: Secondary | ICD-10-CM | POA: Diagnosis not present

## 2024-03-19 DIAGNOSIS — N2581 Secondary hyperparathyroidism of renal origin: Secondary | ICD-10-CM | POA: Diagnosis not present

## 2024-03-19 DIAGNOSIS — Z992 Dependence on renal dialysis: Secondary | ICD-10-CM | POA: Diagnosis not present

## 2024-03-21 DIAGNOSIS — N186 End stage renal disease: Secondary | ICD-10-CM | POA: Diagnosis not present

## 2024-03-21 DIAGNOSIS — N2581 Secondary hyperparathyroidism of renal origin: Secondary | ICD-10-CM | POA: Diagnosis not present

## 2024-03-21 DIAGNOSIS — Z992 Dependence on renal dialysis: Secondary | ICD-10-CM | POA: Diagnosis not present

## 2024-03-24 DIAGNOSIS — N2581 Secondary hyperparathyroidism of renal origin: Secondary | ICD-10-CM | POA: Diagnosis not present

## 2024-03-24 DIAGNOSIS — Z992 Dependence on renal dialysis: Secondary | ICD-10-CM | POA: Diagnosis not present

## 2024-03-24 DIAGNOSIS — N186 End stage renal disease: Secondary | ICD-10-CM | POA: Diagnosis not present

## 2024-03-26 DIAGNOSIS — Z992 Dependence on renal dialysis: Secondary | ICD-10-CM | POA: Diagnosis not present

## 2024-03-26 DIAGNOSIS — N186 End stage renal disease: Secondary | ICD-10-CM | POA: Diagnosis not present

## 2024-03-26 DIAGNOSIS — N2581 Secondary hyperparathyroidism of renal origin: Secondary | ICD-10-CM | POA: Diagnosis not present

## 2024-03-28 DIAGNOSIS — N2581 Secondary hyperparathyroidism of renal origin: Secondary | ICD-10-CM | POA: Diagnosis not present

## 2024-03-28 DIAGNOSIS — Z992 Dependence on renal dialysis: Secondary | ICD-10-CM | POA: Diagnosis not present

## 2024-03-28 DIAGNOSIS — N186 End stage renal disease: Secondary | ICD-10-CM | POA: Diagnosis not present

## 2024-03-31 DIAGNOSIS — N2581 Secondary hyperparathyroidism of renal origin: Secondary | ICD-10-CM | POA: Diagnosis not present

## 2024-03-31 DIAGNOSIS — Z992 Dependence on renal dialysis: Secondary | ICD-10-CM | POA: Diagnosis not present

## 2024-03-31 DIAGNOSIS — N186 End stage renal disease: Secondary | ICD-10-CM | POA: Diagnosis not present

## 2024-04-02 DIAGNOSIS — N2581 Secondary hyperparathyroidism of renal origin: Secondary | ICD-10-CM | POA: Diagnosis not present

## 2024-04-02 DIAGNOSIS — N186 End stage renal disease: Secondary | ICD-10-CM | POA: Diagnosis not present

## 2024-04-02 DIAGNOSIS — Z992 Dependence on renal dialysis: Secondary | ICD-10-CM | POA: Diagnosis not present

## 2024-04-04 DIAGNOSIS — Z992 Dependence on renal dialysis: Secondary | ICD-10-CM | POA: Diagnosis not present

## 2024-04-04 DIAGNOSIS — N186 End stage renal disease: Secondary | ICD-10-CM | POA: Diagnosis not present

## 2024-04-04 DIAGNOSIS — N2581 Secondary hyperparathyroidism of renal origin: Secondary | ICD-10-CM | POA: Diagnosis not present

## 2024-04-07 DIAGNOSIS — N2581 Secondary hyperparathyroidism of renal origin: Secondary | ICD-10-CM | POA: Diagnosis not present

## 2024-04-07 DIAGNOSIS — E1122 Type 2 diabetes mellitus with diabetic chronic kidney disease: Secondary | ICD-10-CM | POA: Diagnosis not present

## 2024-04-07 DIAGNOSIS — N186 End stage renal disease: Secondary | ICD-10-CM | POA: Diagnosis not present

## 2024-04-07 DIAGNOSIS — Z992 Dependence on renal dialysis: Secondary | ICD-10-CM | POA: Diagnosis not present

## 2024-04-09 DIAGNOSIS — N2581 Secondary hyperparathyroidism of renal origin: Secondary | ICD-10-CM | POA: Diagnosis not present

## 2024-04-09 DIAGNOSIS — N186 End stage renal disease: Secondary | ICD-10-CM | POA: Diagnosis not present

## 2024-04-09 DIAGNOSIS — Z992 Dependence on renal dialysis: Secondary | ICD-10-CM | POA: Diagnosis not present

## 2024-04-11 DIAGNOSIS — N2581 Secondary hyperparathyroidism of renal origin: Secondary | ICD-10-CM | POA: Diagnosis not present

## 2024-04-11 DIAGNOSIS — N186 End stage renal disease: Secondary | ICD-10-CM | POA: Diagnosis not present

## 2024-04-11 DIAGNOSIS — Z992 Dependence on renal dialysis: Secondary | ICD-10-CM | POA: Diagnosis not present

## 2024-04-14 DIAGNOSIS — Z992 Dependence on renal dialysis: Secondary | ICD-10-CM | POA: Diagnosis not present

## 2024-04-14 DIAGNOSIS — N2581 Secondary hyperparathyroidism of renal origin: Secondary | ICD-10-CM | POA: Diagnosis not present

## 2024-04-14 DIAGNOSIS — N186 End stage renal disease: Secondary | ICD-10-CM | POA: Diagnosis not present

## 2024-04-16 DIAGNOSIS — N2581 Secondary hyperparathyroidism of renal origin: Secondary | ICD-10-CM | POA: Diagnosis not present

## 2024-04-16 DIAGNOSIS — Z992 Dependence on renal dialysis: Secondary | ICD-10-CM | POA: Diagnosis not present

## 2024-04-16 DIAGNOSIS — N186 End stage renal disease: Secondary | ICD-10-CM | POA: Diagnosis not present

## 2024-04-18 DIAGNOSIS — N186 End stage renal disease: Secondary | ICD-10-CM | POA: Diagnosis not present

## 2024-04-18 DIAGNOSIS — N2581 Secondary hyperparathyroidism of renal origin: Secondary | ICD-10-CM | POA: Diagnosis not present

## 2024-04-18 DIAGNOSIS — Z992 Dependence on renal dialysis: Secondary | ICD-10-CM | POA: Diagnosis not present

## 2024-04-21 DIAGNOSIS — N186 End stage renal disease: Secondary | ICD-10-CM | POA: Diagnosis not present

## 2024-04-21 DIAGNOSIS — Z992 Dependence on renal dialysis: Secondary | ICD-10-CM | POA: Diagnosis not present

## 2024-04-21 DIAGNOSIS — N2581 Secondary hyperparathyroidism of renal origin: Secondary | ICD-10-CM | POA: Diagnosis not present

## 2024-04-23 DIAGNOSIS — N2581 Secondary hyperparathyroidism of renal origin: Secondary | ICD-10-CM | POA: Diagnosis not present

## 2024-04-23 DIAGNOSIS — Z992 Dependence on renal dialysis: Secondary | ICD-10-CM | POA: Diagnosis not present

## 2024-04-23 DIAGNOSIS — N186 End stage renal disease: Secondary | ICD-10-CM | POA: Diagnosis not present

## 2024-04-25 DIAGNOSIS — N186 End stage renal disease: Secondary | ICD-10-CM | POA: Diagnosis not present

## 2024-04-25 DIAGNOSIS — Z992 Dependence on renal dialysis: Secondary | ICD-10-CM | POA: Diagnosis not present

## 2024-04-25 DIAGNOSIS — N2581 Secondary hyperparathyroidism of renal origin: Secondary | ICD-10-CM | POA: Diagnosis not present

## 2024-04-28 DIAGNOSIS — N2581 Secondary hyperparathyroidism of renal origin: Secondary | ICD-10-CM | POA: Diagnosis not present

## 2024-04-28 DIAGNOSIS — Z992 Dependence on renal dialysis: Secondary | ICD-10-CM | POA: Diagnosis not present

## 2024-04-28 DIAGNOSIS — N186 End stage renal disease: Secondary | ICD-10-CM | POA: Diagnosis not present

## 2024-04-30 DIAGNOSIS — N2581 Secondary hyperparathyroidism of renal origin: Secondary | ICD-10-CM | POA: Diagnosis not present

## 2024-04-30 DIAGNOSIS — Z992 Dependence on renal dialysis: Secondary | ICD-10-CM | POA: Diagnosis not present

## 2024-04-30 DIAGNOSIS — N186 End stage renal disease: Secondary | ICD-10-CM | POA: Diagnosis not present

## 2024-05-02 DIAGNOSIS — Z992 Dependence on renal dialysis: Secondary | ICD-10-CM | POA: Diagnosis not present

## 2024-05-02 DIAGNOSIS — N186 End stage renal disease: Secondary | ICD-10-CM | POA: Diagnosis not present

## 2024-05-02 DIAGNOSIS — N2581 Secondary hyperparathyroidism of renal origin: Secondary | ICD-10-CM | POA: Diagnosis not present

## 2024-05-05 DIAGNOSIS — N186 End stage renal disease: Secondary | ICD-10-CM | POA: Diagnosis not present

## 2024-05-05 DIAGNOSIS — N2581 Secondary hyperparathyroidism of renal origin: Secondary | ICD-10-CM | POA: Diagnosis not present

## 2024-05-05 DIAGNOSIS — Z992 Dependence on renal dialysis: Secondary | ICD-10-CM | POA: Diagnosis not present

## 2024-05-06 ENCOUNTER — Encounter: Payer: Self-pay | Admitting: Podiatry

## 2024-05-06 ENCOUNTER — Ambulatory Visit: Admitting: Podiatry

## 2024-05-06 DIAGNOSIS — B351 Tinea unguium: Secondary | ICD-10-CM | POA: Diagnosis not present

## 2024-05-06 DIAGNOSIS — E1122 Type 2 diabetes mellitus with diabetic chronic kidney disease: Secondary | ICD-10-CM

## 2024-05-06 DIAGNOSIS — M79674 Pain in right toe(s): Secondary | ICD-10-CM

## 2024-05-06 DIAGNOSIS — E118 Type 2 diabetes mellitus with unspecified complications: Secondary | ICD-10-CM

## 2024-05-06 DIAGNOSIS — M79675 Pain in left toe(s): Secondary | ICD-10-CM

## 2024-05-06 DIAGNOSIS — N186 End stage renal disease: Secondary | ICD-10-CM | POA: Diagnosis not present

## 2024-05-06 DIAGNOSIS — Z992 Dependence on renal dialysis: Secondary | ICD-10-CM | POA: Diagnosis not present

## 2024-05-06 DIAGNOSIS — N184 Chronic kidney disease, stage 4 (severe): Secondary | ICD-10-CM

## 2024-05-06 DIAGNOSIS — N2581 Secondary hyperparathyroidism of renal origin: Secondary | ICD-10-CM | POA: Diagnosis not present

## 2024-05-06 NOTE — Progress Notes (Signed)
 This patient returns to my office for at risk foot care.  This patient requires this care by a professional since this patient will be at risk due to having CKD, and type 2 diabetes  This patient is unable to cut nails himself since the patient cannot reach his nails.These nails are painful walking and wearing shoes.  This patient presents for at risk foot care today.  General Appearance  Alert, conversant and in no acute stress.  Vascular  Dorsalis pedis and posterior tibial  pulses are weakly palpable  bilaterally.  Capillary return is within normal limits  bilaterally. Temperature is within normal limits  bilaterally.  Neurologic  Senn-Weinstein monofilament wire test within normal limits  bilaterally. Muscle power within normal limits bilaterally.  Nails Thick disfigured discolored nails with subungual debris  from hallux to fifth toes bilaterally. No evidence of bacterial infection or drainage bilaterally.  Orthopedic  No limitations of motion  feet .  No crepitus or effusions noted.  No bony pathology or digital deformities noted. HAV  B/L.  Skin  normotropic skin with no porokeratosis noted bilaterally.  No signs of infections or ulcers noted.     Onychomycosis  Pain in right toes  Pain in left toes  Consent was obtained for treatment procedures.   Mechanical debridement of nails 1-5  bilaterally performed with a nail nipper.  Filed with dremel without incident.    Return office visit    3 months                 Told patient to return for periodic foot care and evaluation due to potential at risk complications.   Helane Gunther DPM

## 2024-05-07 DIAGNOSIS — N2581 Secondary hyperparathyroidism of renal origin: Secondary | ICD-10-CM | POA: Diagnosis not present

## 2024-05-07 DIAGNOSIS — N186 End stage renal disease: Secondary | ICD-10-CM | POA: Diagnosis not present

## 2024-05-07 DIAGNOSIS — Z992 Dependence on renal dialysis: Secondary | ICD-10-CM | POA: Diagnosis not present

## 2024-05-08 DIAGNOSIS — Z992 Dependence on renal dialysis: Secondary | ICD-10-CM | POA: Diagnosis not present

## 2024-05-08 DIAGNOSIS — E1122 Type 2 diabetes mellitus with diabetic chronic kidney disease: Secondary | ICD-10-CM | POA: Diagnosis not present

## 2024-05-08 DIAGNOSIS — N186 End stage renal disease: Secondary | ICD-10-CM | POA: Diagnosis not present

## 2024-05-09 DIAGNOSIS — N186 End stage renal disease: Secondary | ICD-10-CM | POA: Diagnosis not present

## 2024-05-09 DIAGNOSIS — N2581 Secondary hyperparathyroidism of renal origin: Secondary | ICD-10-CM | POA: Diagnosis not present

## 2024-05-09 DIAGNOSIS — Z992 Dependence on renal dialysis: Secondary | ICD-10-CM | POA: Diagnosis not present

## 2024-05-12 DIAGNOSIS — N186 End stage renal disease: Secondary | ICD-10-CM | POA: Diagnosis not present

## 2024-05-12 DIAGNOSIS — Z992 Dependence on renal dialysis: Secondary | ICD-10-CM | POA: Diagnosis not present

## 2024-05-12 DIAGNOSIS — N2581 Secondary hyperparathyroidism of renal origin: Secondary | ICD-10-CM | POA: Diagnosis not present

## 2024-05-14 DIAGNOSIS — Z992 Dependence on renal dialysis: Secondary | ICD-10-CM | POA: Diagnosis not present

## 2024-05-14 DIAGNOSIS — N2581 Secondary hyperparathyroidism of renal origin: Secondary | ICD-10-CM | POA: Diagnosis not present

## 2024-05-14 DIAGNOSIS — N186 End stage renal disease: Secondary | ICD-10-CM | POA: Diagnosis not present

## 2024-05-16 DIAGNOSIS — N2581 Secondary hyperparathyroidism of renal origin: Secondary | ICD-10-CM | POA: Diagnosis not present

## 2024-05-16 DIAGNOSIS — N186 End stage renal disease: Secondary | ICD-10-CM | POA: Diagnosis not present

## 2024-05-16 DIAGNOSIS — Z992 Dependence on renal dialysis: Secondary | ICD-10-CM | POA: Diagnosis not present

## 2024-05-19 DIAGNOSIS — N186 End stage renal disease: Secondary | ICD-10-CM | POA: Diagnosis not present

## 2024-05-19 DIAGNOSIS — Z992 Dependence on renal dialysis: Secondary | ICD-10-CM | POA: Diagnosis not present

## 2024-05-19 DIAGNOSIS — N2581 Secondary hyperparathyroidism of renal origin: Secondary | ICD-10-CM | POA: Diagnosis not present

## 2024-05-21 DIAGNOSIS — N2581 Secondary hyperparathyroidism of renal origin: Secondary | ICD-10-CM | POA: Diagnosis not present

## 2024-05-21 DIAGNOSIS — Z992 Dependence on renal dialysis: Secondary | ICD-10-CM | POA: Diagnosis not present

## 2024-05-21 DIAGNOSIS — N186 End stage renal disease: Secondary | ICD-10-CM | POA: Diagnosis not present

## 2024-05-23 DIAGNOSIS — N2581 Secondary hyperparathyroidism of renal origin: Secondary | ICD-10-CM | POA: Diagnosis not present

## 2024-05-23 DIAGNOSIS — N186 End stage renal disease: Secondary | ICD-10-CM | POA: Diagnosis not present

## 2024-05-23 DIAGNOSIS — Z992 Dependence on renal dialysis: Secondary | ICD-10-CM | POA: Diagnosis not present

## 2024-05-26 DIAGNOSIS — Z992 Dependence on renal dialysis: Secondary | ICD-10-CM | POA: Diagnosis not present

## 2024-05-26 DIAGNOSIS — N2581 Secondary hyperparathyroidism of renal origin: Secondary | ICD-10-CM | POA: Diagnosis not present

## 2024-05-26 DIAGNOSIS — N186 End stage renal disease: Secondary | ICD-10-CM | POA: Diagnosis not present

## 2024-05-28 DIAGNOSIS — Z992 Dependence on renal dialysis: Secondary | ICD-10-CM | POA: Diagnosis not present

## 2024-05-28 DIAGNOSIS — N2581 Secondary hyperparathyroidism of renal origin: Secondary | ICD-10-CM | POA: Diagnosis not present

## 2024-05-28 DIAGNOSIS — N186 End stage renal disease: Secondary | ICD-10-CM | POA: Diagnosis not present

## 2024-05-30 DIAGNOSIS — N186 End stage renal disease: Secondary | ICD-10-CM | POA: Diagnosis not present

## 2024-05-30 DIAGNOSIS — Z992 Dependence on renal dialysis: Secondary | ICD-10-CM | POA: Diagnosis not present

## 2024-05-30 DIAGNOSIS — N2581 Secondary hyperparathyroidism of renal origin: Secondary | ICD-10-CM | POA: Diagnosis not present

## 2024-06-02 ENCOUNTER — Other Ambulatory Visit: Payer: Self-pay

## 2024-06-02 ENCOUNTER — Emergency Department (HOSPITAL_COMMUNITY)

## 2024-06-02 ENCOUNTER — Emergency Department (HOSPITAL_COMMUNITY)
Admission: EM | Admit: 2024-06-02 | Discharge: 2024-06-02 | Disposition: A | Discharge status: Home / Self Care | Attending: Emergency Medicine | Admitting: Emergency Medicine

## 2024-06-02 ENCOUNTER — Encounter (HOSPITAL_COMMUNITY): Payer: Self-pay

## 2024-06-02 DIAGNOSIS — S0993XA Unspecified injury of face, initial encounter: Secondary | ICD-10-CM | POA: Diagnosis present

## 2024-06-02 DIAGNOSIS — S01111A Laceration without foreign body of right eyelid and periocular area, initial encounter: Secondary | ICD-10-CM | POA: Diagnosis not present

## 2024-06-02 DIAGNOSIS — I6789 Other cerebrovascular disease: Secondary | ICD-10-CM | POA: Insufficient documentation

## 2024-06-02 DIAGNOSIS — N186 End stage renal disease: Secondary | ICD-10-CM | POA: Diagnosis not present

## 2024-06-02 DIAGNOSIS — E1122 Type 2 diabetes mellitus with diabetic chronic kidney disease: Secondary | ICD-10-CM | POA: Insufficient documentation

## 2024-06-02 DIAGNOSIS — I5032 Chronic diastolic (congestive) heart failure: Secondary | ICD-10-CM | POA: Insufficient documentation

## 2024-06-02 DIAGNOSIS — I132 Hypertensive heart and chronic kidney disease with heart failure and with stage 5 chronic kidney disease, or end stage renal disease: Secondary | ICD-10-CM | POA: Diagnosis not present

## 2024-06-02 DIAGNOSIS — W010XXA Fall on same level from slipping, tripping and stumbling without subsequent striking against object, initial encounter: Secondary | ICD-10-CM | POA: Diagnosis not present

## 2024-06-02 DIAGNOSIS — S0181XA Laceration without foreign body of other part of head, initial encounter: Secondary | ICD-10-CM

## 2024-06-02 DIAGNOSIS — Z794 Long term (current) use of insulin: Secondary | ICD-10-CM | POA: Diagnosis not present

## 2024-06-02 DIAGNOSIS — Z992 Dependence on renal dialysis: Secondary | ICD-10-CM | POA: Diagnosis not present

## 2024-06-02 DIAGNOSIS — W19XXXA Unspecified fall, initial encounter: Secondary | ICD-10-CM

## 2024-06-02 DIAGNOSIS — S0990XA Unspecified injury of head, initial encounter: Secondary | ICD-10-CM | POA: Insufficient documentation

## 2024-06-02 DIAGNOSIS — Z7982 Long term (current) use of aspirin: Secondary | ICD-10-CM | POA: Diagnosis not present

## 2024-06-02 DIAGNOSIS — E039 Hypothyroidism, unspecified: Secondary | ICD-10-CM | POA: Insufficient documentation

## 2024-06-02 DIAGNOSIS — R58 Hemorrhage, not elsewhere classified: Secondary | ICD-10-CM | POA: Diagnosis not present

## 2024-06-02 DIAGNOSIS — I10 Essential (primary) hypertension: Secondary | ICD-10-CM | POA: Diagnosis not present

## 2024-06-02 DIAGNOSIS — R220 Localized swelling, mass and lump, head: Secondary | ICD-10-CM | POA: Diagnosis not present

## 2024-06-02 MED ORDER — LIDOCAINE-EPINEPHRINE (PF) 2 %-1:200000 IJ SOLN
10.0000 mL | Freq: Once | INTRAMUSCULAR | Status: AC
  Administered 2024-06-02: 10 mL via INTRADERMAL
  Filled 2024-06-02: qty 20

## 2024-06-02 MED ORDER — TRANEXAMIC ACID FOR EPISTAXIS
500.0000 mg | Freq: Once | TOPICAL | Status: AC
  Administered 2024-06-02: 500 mg via TOPICAL
  Filled 2024-06-02: qty 10

## 2024-06-02 NOTE — ED Provider Notes (Signed)
 [place] EMERGENCY DEPARTMENT AT [facility] Provider Note  CSN: [id number] Arrival date & time: 06/02/24 9451  Chief Complaint(s) Fall  HPI David Nguyen is a 83 y.o. male with a past medical history listed below including ESRD on dialysis MWF here after mechanical fall while walking to the dialysis center.  He reports tripping and falling forward while walking causing him to hit his head.  He did not any loss of consciousness.  He is not anticoagulated.  He endorses pain over the laceration.  Denies any headache, neck pain, back pain, chest pain or extremity pain.  Denies any other injuries related to the fall.  The history is provided by the patient.    Past Medical History Past Medical History:  Diagnosis Date   Allergic rhinitis    Anemia    low iron   Arthritis    left leg   Bradycardia 03/2019   Chronic kidney disease    Followed by Dr Deterding     Diabetes mellitus    Type II   Horseshoe kidney    Hyperlipidemia    Hypertension    Hypothyroidism    Refusal of blood transfusions as patient is Jehovah's Witness    Scrotal edema    Sleep apnea    uses a cpap   Patient Active Problem List   Diagnosis Date Noted   Venous stasis of lower extremity 03/30/2022   Subcutaneous nodules 03/30/2022   Left leg pain 08/16/2021   Pain due to onychomycosis of toenails of both feet 02/07/2021   Symptomatic bradycardia 04/20/2020   Scrotal edema 04/20/2020   Primary osteoarthritis of left knee 08/14/2019   Chronic diastolic CHF (congestive heart failure) (HCC) 04/04/2019   Pulmonary hypertension (HCC) 04/04/2019   Diabetes mellitus type 2, controlled, with complications (HCC) 04/04/2019   Anemia of chronic disease 04/04/2019   Horseshoe kidney    Bradycardia 03/31/2019   Hypothyroidism 03/31/2019   End-stage renal disease on hemodialysis (HCC) 04/15/2018   History of anemia due to CKD 04/15/2018   HYPERLIPIDEMIA 05/26/2008   Essential hypertension 05/26/2008    Allergic rhinitis 05/26/2008   OSA on CPAP 05/26/2008   Home Medication(s) Prior to Admission medications   Medication Sig Start Date End Date Taking? Authorizing Provider  acetaminophen  (TYLENOL ) 650 MG CR tablet Take 1,300 mg by mouth as needed for pain.    [provider]  allopurinol  (ZYLOPRIM ) 100 MG tablet Take 100 mg by mouth daily. 12/07/15   [provider]  aspirin  EC 81 MG tablet Take 81 mg by mouth daily.    [provider]  atorvastatin  (LIPITOR) 40 MG tablet Take 40 mg by mouth at bedtime.     [provider]  Docusate Sodium  (DSS) 100 MG CAPS Take 100 mg by mouth in the morning and at bedtime. 05/04/21   [provider]  doxycycline  (VIBRAMYCIN ) 100 MG capsule Take 1 capsule (100 mg total) by mouth 2 (two) times daily. One po bid x 7 days 08/16/23   Freddi Hamilton, MD  insulin  NPH-regular Human (70-30) 100 UNIT/ML injection Inject 16-18 Units into the skin See admin instructions. Inject 18 units in the morning and 16 units at night    [provider]  levothyroxine  (SYNTHROID , LEVOTHROID) 75 MCG tablet Take 75 mcg by mouth daily before breakfast.    [provider]  loratadine  (CLARITIN ) 10 MG tablet Take 10 mg by mouth daily.    [provider]  midodrine (PROAMATINE) 5 MG tablet  Take 5 mg by mouth 3 (three) times a week. Take 10 mg by mouth on Monday, Wednesday, Friday 30 minutes before dialysis 08/09/23   [provider]  montelukast  (SINGULAIR ) 10 MG tablet Take 10 mg by mouth daily. 05/21/20   [provider]  PRESCRIPTION MEDICATION Inhale into the lungs at bedtime. CPAP    [provider]  PRESCRIPTION MEDICATION Inject 10 Units into the skin every evening. Unknown insulin     [provider]  sevelamer carbonate (RENVELA) 800 MG tablet Take 800 mg by mouth 3 (three) times daily.    [provider]  traMADol  (ULTRAM ) 50 MG tablet Take 25 mg by mouth in the  morning and at bedtime.    [provider]                                                                                                                                    Allergies Penicillins  Review of Systems Review of Systems As noted in HPI  Physical Exam Vital Signs  I have reviewed the triage vital signs BP (!) 142/63   Pulse 71   Temp (!) 97.4 F (36.3 C) (Oral)   Resp 19   SpO2 100%   Physical Exam Constitutional:      General: He is not in acute distress.    Appearance: He is well-developed. He is not diaphoretic.  HENT:     Head: Normocephalic. Laceration present.      Right Ear: External ear normal.     Left Ear: External ear normal.  Eyes:     General: No scleral icterus.       Right eye: No discharge.        Left eye: No discharge.     Conjunctiva/sclera: Conjunctivae normal.     Pupils: Pupils are equal, round, and reactive to light.  Cardiovascular:     Rate and Rhythm: Regular rhythm.     Pulses:          Radial pulses are 2+ on the right side and 2+ on the left side.       Dorsalis pedis pulses are 2+ on the right side and 2+ on the left side.     Heart sounds: Normal heart sounds. No murmur heard.    No friction rub. No gallop.  Pulmonary:     Effort: Pulmonary effort is normal. No respiratory distress.     Breath sounds: Normal breath sounds. No stridor.  Abdominal:     General: There is no distension.     Palpations: Abdomen is soft.     Tenderness: There is no abdominal tenderness.  Musculoskeletal:     Cervical back: Normal range of motion and neck supple. No bony tenderness.     Thoracic back: No bony tenderness.     Lumbar back: No bony tenderness.     Comments: Clavicle stable. Chest stable to AP/Lat compression. Pelvis  stable to Lat compression. No obvious extremity deformity. No chest or abdominal wall contusion.  Skin:    General: Skin is warm.  Neurological:     Mental Status: He is alert and oriented to person,  place, and time.     GCS: GCS eye subscore is 4. GCS verbal subscore is 5. GCS motor subscore is 6.     Comments: Moving all extremities      ED Results and Treatments Labs (all labs ordered are listed, but only abnormal results are displayed) Labs Reviewed - No data to display                                                                                                                       EKG  EKG Interpretation Date/Time:  Monday June 02 2024 05:57:56 EDT Ventricular Rate:  74 PR Interval:  46 QRS Duration:  160 QT Interval:  442 QTC Calculation: 491 R Axis:   -41  Text Interpretation: Sinus rhythm Short PR interval RBBB and LAFB Confirmed by Trine Likes [phone]) on 06/02/2024 6:32:57 AM       Radiology CT Head Wo Contrast Result Date: 06/02/2024 CLINICAL DATA:  83 year old male status post fall striking head. Dialysis patient. Laceration above right eyebrow with bleeding controlled. EXAM: CT HEAD WITHOUT CONTRAST TECHNIQUE: Contiguous axial images were obtained from the base of the skull through the vertex without intravenous contrast. RADIATION DOSE REDUCTION: This exam was performed according to the departmental dose-optimization program which includes automated exposure control, adjustment of the mA and/or kV according to patient size and/or use of iterative reconstruction technique. COMPARISON:  None Available. FINDINGS: Brain: No midline shift, ventriculomegaly, mass effect, evidence of mass lesion, intracranial hemorrhage or evidence of cortically based acute infarction. Basal ganglia vascular calcifications. Patchy mild to moderate periventricular white matter hypodensity. Small chronic appearing left cerebellum PICA territory infarct (coronal image 24). Vascular: Calcified atherosclerosis at the skull base. No suspicious intracranial vascular hyperdensity. Skull: No fracture identified. Sinuses/Orbits: Visualized paranasal sinuses and mastoids are clear. Other: Mild  right periorbital, supraorbital soft tissue swelling which is preseptal. Dressing material in place. No soft tissue gas identified. Left orbit and other scalp soft tissues appear negative; mild calcified scalp vessel atherosclerosis. IMPRESSION: 1. Mild right periorbital soft tissue injury. No skull fracture identified. 2. No acute intracranial abnormality. Chronic appearing small vessel disease. Electronically Signed   By: VEAR Hurst M.D.   On: 06/02/2024 06:42    Medications Ordered in ED Medications  tranexamic acid  (CYKLOKAPRON ) 1000 MG/10ML topical solution 500 mg (500 mg Topical Given 06/02/24 0655)  lidocaine -EPINEPHrine  (XYLOCAINE  W/EPI) 2 %-1:200000 (PF) injection 10 mL (10 mLs Intradermal Given by Other 06/02/24 9344)   Procedures .Laceration Repair  Date/Time: 06/02/2024 7:10 AM  Performed by: Trine Likes Moder, MD Authorized by: Trine Likes Moder, MD   Consent:    Consent obtained:  Verbal   Consent given by:  Patient   Risks discussed:  Infection, pain, poor cosmetic result, poor wound healing  and nerve damage   Alternatives discussed:  No treatment Universal protocol:    Patient identity confirmed:  Arm band Laceration details:    Location:  Face   Face location:  R eyebrow   Length (cm):  1   Depth (mm):  3 Pre-procedure details:    Preparation:  Patient was prepped and draped in usual sterile fashion and imaging obtained to evaluate for foreign bodies Exploration:    Hemostasis achieved with:  Direct pressure and epinephrine    Imaging outcome: foreign body not noted     Contaminated: no   Treatment:    Area cleansed with:  Povidone-iodine    Amount of cleaning:  Standard   Irrigation solution:  Sterile saline   Irrigation method:  Pressure wash Skin repair:    Repair method:  Sutures   Suture size:  5-0   Suture material:  Fast-absorbing gut   Suture technique:  Simple interrupted   Number of sutures:  4 Approximation:    Approximation:  Close Repair  type:    Repair type:  Simple Post-procedure details:    Dressing:  Non-adherent dressing and antibiotic ointment   Procedure completion:  Tolerated   (including critical care time) Medical Decision Making / ED Course   Medical Decision Making Amount and/or Complexity of Data Reviewed Radiology: ordered.  Risk Prescription drug management.    Mechanical fall resulting in head trauma and face laceration. No other injuries noted on exam requiring imaging. CT head obtained to rule out ICH. TXA used for hemostasis. Wound was thoroughly irrigated and closed as above. Spoke with his HD center and they will coordinate to get the patient in tomorrow.    Final Clinical Impression(s) / ED Diagnoses Final diagnoses:  Fall, initial encounter  Facial laceration, initial encounter   The patient appears reasonably screened and/or stabilized for discharge and I doubt any other medical condition or other [facility] requiring further screening, evaluation, or treatment in the ED at this time. I have discussed the findings, Dx and Tx plan with the patient/family who expressed understanding and agree(s) with the plan. Discharge instructions discussed at length. The patient/family was given strict return precautions who verbalized understanding of the instructions. No further questions at time of discharge.  Disposition: Discharge  Condition: Good  ED Discharge Orders     None         Follow Up: Janey Santos, MD [address] [place] [address] [phone]  Call  to schedule an appointment for close follow up    This chart was dictated using voice recognition software.  Despite best efforts to proofread,  errors can occur which can change the documentation meaning.    Trine Raynell Moder, MD 06/02/24 [phone]

## 2024-06-02 NOTE — Discharge Instructions (Addendum)
Do not let your laceration (cut) get wet for the next 48 hours. After that you may allow soapy water to drain down the wound to clean it.  Please do not scrub.  Do not submerge the wound under water for the next 2 weeks.  To minimize scarring, you can apply a vaseline based ointment for the next 2 weeks and keep it out of direct sun light. After that, you may apply sunscreen for the next several months.  Your stitches will dissolve on their own.   Return if your wound appears to be infected (see laceration care instructions).

## 2024-06-02 NOTE — ED Notes (Signed)
 Patient transported to CT

## 2024-06-02 NOTE — ED Triage Notes (Signed)
 Patient is coming from home. Patient was getting ready for dialysis when he tripped and fell hitting his head on the concrete. Patient presenting with laceration above right eyebrow. Bleeding controlled with pressure dressing. No LOC or blood thinners EMS VS 165/67 BP 80 HR 98% RA 219 CBG

## 2024-06-02 NOTE — ED Notes (Signed)
 Message sent to pharmacy to send patient's ordered tranexamic acid  to [facility].

## 2024-06-02 NOTE — ED Notes (Signed)
Pt wound cleansed and dressed.

## 2024-06-03 DIAGNOSIS — N186 End stage renal disease: Secondary | ICD-10-CM | POA: Diagnosis not present

## 2024-06-03 DIAGNOSIS — N2581 Secondary hyperparathyroidism of renal origin: Secondary | ICD-10-CM | POA: Diagnosis not present

## 2024-06-03 DIAGNOSIS — Z992 Dependence on renal dialysis: Secondary | ICD-10-CM | POA: Diagnosis not present

## 2024-06-04 DIAGNOSIS — Z992 Dependence on renal dialysis: Secondary | ICD-10-CM | POA: Diagnosis not present

## 2024-06-04 DIAGNOSIS — N186 End stage renal disease: Secondary | ICD-10-CM | POA: Diagnosis not present

## 2024-06-04 DIAGNOSIS — N2581 Secondary hyperparathyroidism of renal origin: Secondary | ICD-10-CM | POA: Diagnosis not present

## 2024-06-06 DIAGNOSIS — N2581 Secondary hyperparathyroidism of renal origin: Secondary | ICD-10-CM | POA: Diagnosis not present

## 2024-06-06 DIAGNOSIS — N186 End stage renal disease: Secondary | ICD-10-CM | POA: Diagnosis not present

## 2024-06-06 DIAGNOSIS — Z992 Dependence on renal dialysis: Secondary | ICD-10-CM | POA: Diagnosis not present

## 2024-06-08 DIAGNOSIS — E1122 Type 2 diabetes mellitus with diabetic chronic kidney disease: Secondary | ICD-10-CM | POA: Diagnosis not present

## 2024-06-08 DIAGNOSIS — N186 End stage renal disease: Secondary | ICD-10-CM | POA: Diagnosis not present

## 2024-06-08 DIAGNOSIS — Z992 Dependence on renal dialysis: Secondary | ICD-10-CM | POA: Diagnosis not present

## 2024-06-09 DIAGNOSIS — Z992 Dependence on renal dialysis: Secondary | ICD-10-CM | POA: Diagnosis not present

## 2024-06-09 DIAGNOSIS — N2581 Secondary hyperparathyroidism of renal origin: Secondary | ICD-10-CM | POA: Diagnosis not present

## 2024-06-09 DIAGNOSIS — N186 End stage renal disease: Secondary | ICD-10-CM | POA: Diagnosis not present

## 2024-06-10 DIAGNOSIS — S0083XA Contusion of other part of head, initial encounter: Secondary | ICD-10-CM | POA: Diagnosis not present

## 2024-06-10 DIAGNOSIS — Z5189 Encounter for other specified aftercare: Secondary | ICD-10-CM | POA: Diagnosis not present

## 2024-06-10 DIAGNOSIS — Z4802 Encounter for removal of sutures: Secondary | ICD-10-CM | POA: Diagnosis not present

## 2024-06-10 DIAGNOSIS — E1129 Type 2 diabetes mellitus with other diabetic kidney complication: Secondary | ICD-10-CM | POA: Diagnosis not present

## 2024-06-10 DIAGNOSIS — Z992 Dependence on renal dialysis: Secondary | ICD-10-CM | POA: Diagnosis not present

## 2024-06-10 DIAGNOSIS — R29898 Other symptoms and signs involving the musculoskeletal system: Secondary | ICD-10-CM | POA: Diagnosis not present

## 2024-06-10 DIAGNOSIS — W010XXA Fall on same level from slipping, tripping and stumbling without subsequent striking against object, initial encounter: Secondary | ICD-10-CM | POA: Diagnosis not present

## 2024-06-10 DIAGNOSIS — I130 Hypertensive heart and chronic kidney disease with heart failure and stage 1 through stage 4 chronic kidney disease, or unspecified chronic kidney disease: Secondary | ICD-10-CM | POA: Diagnosis not present

## 2024-06-11 DIAGNOSIS — N2581 Secondary hyperparathyroidism of renal origin: Secondary | ICD-10-CM | POA: Diagnosis not present

## 2024-06-11 DIAGNOSIS — N186 End stage renal disease: Secondary | ICD-10-CM | POA: Diagnosis not present

## 2024-06-11 DIAGNOSIS — Z992 Dependence on renal dialysis: Secondary | ICD-10-CM | POA: Diagnosis not present

## 2024-06-13 DIAGNOSIS — N186 End stage renal disease: Secondary | ICD-10-CM | POA: Diagnosis not present

## 2024-06-13 DIAGNOSIS — N2581 Secondary hyperparathyroidism of renal origin: Secondary | ICD-10-CM | POA: Diagnosis not present

## 2024-06-13 DIAGNOSIS — Z992 Dependence on renal dialysis: Secondary | ICD-10-CM | POA: Diagnosis not present

## 2024-06-16 DIAGNOSIS — N186 End stage renal disease: Secondary | ICD-10-CM | POA: Diagnosis not present

## 2024-06-16 DIAGNOSIS — Z992 Dependence on renal dialysis: Secondary | ICD-10-CM | POA: Diagnosis not present

## 2024-06-16 DIAGNOSIS — N2581 Secondary hyperparathyroidism of renal origin: Secondary | ICD-10-CM | POA: Diagnosis not present

## 2024-06-18 DIAGNOSIS — Z992 Dependence on renal dialysis: Secondary | ICD-10-CM | POA: Diagnosis not present

## 2024-06-18 DIAGNOSIS — N2581 Secondary hyperparathyroidism of renal origin: Secondary | ICD-10-CM | POA: Diagnosis not present

## 2024-06-18 DIAGNOSIS — N186 End stage renal disease: Secondary | ICD-10-CM | POA: Diagnosis not present

## 2024-06-20 DIAGNOSIS — N186 End stage renal disease: Secondary | ICD-10-CM | POA: Diagnosis not present

## 2024-06-20 DIAGNOSIS — Z992 Dependence on renal dialysis: Secondary | ICD-10-CM | POA: Diagnosis not present

## 2024-06-20 DIAGNOSIS — N2581 Secondary hyperparathyroidism of renal origin: Secondary | ICD-10-CM | POA: Diagnosis not present

## 2024-06-23 DIAGNOSIS — Z992 Dependence on renal dialysis: Secondary | ICD-10-CM | POA: Diagnosis not present

## 2024-06-23 DIAGNOSIS — N186 End stage renal disease: Secondary | ICD-10-CM | POA: Diagnosis not present

## 2024-06-23 DIAGNOSIS — N2581 Secondary hyperparathyroidism of renal origin: Secondary | ICD-10-CM | POA: Diagnosis not present

## 2024-06-24 DIAGNOSIS — M5416 Radiculopathy, lumbar region: Secondary | ICD-10-CM | POA: Diagnosis not present

## 2024-06-24 DIAGNOSIS — N2581 Secondary hyperparathyroidism of renal origin: Secondary | ICD-10-CM | POA: Diagnosis not present

## 2024-06-24 DIAGNOSIS — G4733 Obstructive sleep apnea (adult) (pediatric): Secondary | ICD-10-CM | POA: Diagnosis not present

## 2024-06-24 DIAGNOSIS — E1129 Type 2 diabetes mellitus with other diabetic kidney complication: Secondary | ICD-10-CM | POA: Diagnosis not present

## 2024-06-24 DIAGNOSIS — Z992 Dependence on renal dialysis: Secondary | ICD-10-CM | POA: Diagnosis not present

## 2024-06-24 DIAGNOSIS — Z23 Encounter for immunization: Secondary | ICD-10-CM | POA: Diagnosis not present

## 2024-06-24 DIAGNOSIS — I130 Hypertensive heart and chronic kidney disease with heart failure and stage 1 through stage 4 chronic kidney disease, or unspecified chronic kidney disease: Secondary | ICD-10-CM | POA: Diagnosis not present

## 2024-06-24 DIAGNOSIS — N186 End stage renal disease: Secondary | ICD-10-CM | POA: Diagnosis not present

## 2024-06-24 DIAGNOSIS — I5032 Chronic diastolic (congestive) heart failure: Secondary | ICD-10-CM | POA: Diagnosis not present

## 2024-06-25 DIAGNOSIS — N2581 Secondary hyperparathyroidism of renal origin: Secondary | ICD-10-CM | POA: Diagnosis not present

## 2024-06-25 DIAGNOSIS — N186 End stage renal disease: Secondary | ICD-10-CM | POA: Diagnosis not present

## 2024-06-25 DIAGNOSIS — Z992 Dependence on renal dialysis: Secondary | ICD-10-CM | POA: Diagnosis not present

## 2024-06-27 DIAGNOSIS — N2581 Secondary hyperparathyroidism of renal origin: Secondary | ICD-10-CM | POA: Diagnosis not present

## 2024-06-27 DIAGNOSIS — Z992 Dependence on renal dialysis: Secondary | ICD-10-CM | POA: Diagnosis not present

## 2024-06-27 DIAGNOSIS — N186 End stage renal disease: Secondary | ICD-10-CM | POA: Diagnosis not present

## 2024-06-30 DIAGNOSIS — Z992 Dependence on renal dialysis: Secondary | ICD-10-CM | POA: Diagnosis not present

## 2024-06-30 DIAGNOSIS — N186 End stage renal disease: Secondary | ICD-10-CM | POA: Diagnosis not present

## 2024-06-30 DIAGNOSIS — N2581 Secondary hyperparathyroidism of renal origin: Secondary | ICD-10-CM | POA: Diagnosis not present

## 2024-07-02 DIAGNOSIS — N2581 Secondary hyperparathyroidism of renal origin: Secondary | ICD-10-CM | POA: Diagnosis not present

## 2024-07-02 DIAGNOSIS — Z992 Dependence on renal dialysis: Secondary | ICD-10-CM | POA: Diagnosis not present

## 2024-07-02 DIAGNOSIS — N186 End stage renal disease: Secondary | ICD-10-CM | POA: Diagnosis not present

## 2024-07-04 DIAGNOSIS — N2581 Secondary hyperparathyroidism of renal origin: Secondary | ICD-10-CM | POA: Diagnosis not present

## 2024-07-04 DIAGNOSIS — N186 End stage renal disease: Secondary | ICD-10-CM | POA: Diagnosis not present

## 2024-07-04 DIAGNOSIS — Z992 Dependence on renal dialysis: Secondary | ICD-10-CM | POA: Diagnosis not present

## 2024-07-07 DIAGNOSIS — N2581 Secondary hyperparathyroidism of renal origin: Secondary | ICD-10-CM | POA: Diagnosis not present

## 2024-07-07 DIAGNOSIS — N186 End stage renal disease: Secondary | ICD-10-CM | POA: Diagnosis not present

## 2024-07-07 DIAGNOSIS — Z992 Dependence on renal dialysis: Secondary | ICD-10-CM | POA: Diagnosis not present

## 2024-07-08 DIAGNOSIS — R29898 Other symptoms and signs involving the musculoskeletal system: Secondary | ICD-10-CM | POA: Diagnosis not present

## 2024-07-08 DIAGNOSIS — N186 End stage renal disease: Secondary | ICD-10-CM | POA: Diagnosis not present

## 2024-07-08 DIAGNOSIS — R0781 Pleurodynia: Secondary | ICD-10-CM | POA: Diagnosis not present

## 2024-07-08 DIAGNOSIS — M51369 Other intervertebral disc degeneration, lumbar region without mention of lumbar back pain or lower extremity pain: Secondary | ICD-10-CM | POA: Diagnosis not present

## 2024-07-08 DIAGNOSIS — M549 Dorsalgia, unspecified: Secondary | ICD-10-CM | POA: Diagnosis not present

## 2024-07-08 DIAGNOSIS — R2681 Unsteadiness on feet: Secondary | ICD-10-CM | POA: Diagnosis not present

## 2024-07-08 DIAGNOSIS — R269 Unspecified abnormalities of gait and mobility: Secondary | ICD-10-CM | POA: Diagnosis not present

## 2024-07-08 DIAGNOSIS — M419 Scoliosis, unspecified: Secondary | ICD-10-CM | POA: Diagnosis not present

## 2024-07-08 DIAGNOSIS — M545 Low back pain, unspecified: Secondary | ICD-10-CM | POA: Diagnosis not present

## 2024-07-08 DIAGNOSIS — M1712 Unilateral primary osteoarthritis, left knee: Secondary | ICD-10-CM | POA: Diagnosis not present

## 2024-07-08 DIAGNOSIS — E1122 Type 2 diabetes mellitus with diabetic chronic kidney disease: Secondary | ICD-10-CM | POA: Diagnosis not present

## 2024-07-08 DIAGNOSIS — Z992 Dependence on renal dialysis: Secondary | ICD-10-CM | POA: Diagnosis not present

## 2024-07-09 DIAGNOSIS — N186 End stage renal disease: Secondary | ICD-10-CM | POA: Diagnosis not present

## 2024-07-09 DIAGNOSIS — Z992 Dependence on renal dialysis: Secondary | ICD-10-CM | POA: Diagnosis not present

## 2024-07-09 DIAGNOSIS — N2581 Secondary hyperparathyroidism of renal origin: Secondary | ICD-10-CM | POA: Diagnosis not present

## 2024-07-14 DIAGNOSIS — Z992 Dependence on renal dialysis: Secondary | ICD-10-CM | POA: Diagnosis not present

## 2024-07-14 DIAGNOSIS — N2581 Secondary hyperparathyroidism of renal origin: Secondary | ICD-10-CM | POA: Diagnosis not present

## 2024-07-17 DIAGNOSIS — M1990 Unspecified osteoarthritis, unspecified site: Secondary | ICD-10-CM | POA: Diagnosis not present

## 2024-07-17 DIAGNOSIS — M4800 Spinal stenosis, site unspecified: Secondary | ICD-10-CM | POA: Diagnosis not present

## 2024-07-17 DIAGNOSIS — M109 Gout, unspecified: Secondary | ICD-10-CM | POA: Diagnosis not present

## 2024-07-17 DIAGNOSIS — I951 Orthostatic hypotension: Secondary | ICD-10-CM | POA: Diagnosis not present

## 2024-07-17 DIAGNOSIS — N186 End stage renal disease: Secondary | ICD-10-CM | POA: Diagnosis not present

## 2024-07-17 DIAGNOSIS — K5900 Constipation, unspecified: Secondary | ICD-10-CM | POA: Diagnosis not present

## 2024-07-17 DIAGNOSIS — G4733 Obstructive sleep apnea (adult) (pediatric): Secondary | ICD-10-CM | POA: Diagnosis not present

## 2024-07-17 DIAGNOSIS — R2681 Unsteadiness on feet: Secondary | ICD-10-CM | POA: Diagnosis not present

## 2024-07-17 DIAGNOSIS — E1122 Type 2 diabetes mellitus with diabetic chronic kidney disease: Secondary | ICD-10-CM | POA: Diagnosis not present

## 2024-07-17 DIAGNOSIS — E785 Hyperlipidemia, unspecified: Secondary | ICD-10-CM | POA: Diagnosis not present

## 2024-07-18 DIAGNOSIS — N2581 Secondary hyperparathyroidism of renal origin: Secondary | ICD-10-CM | POA: Diagnosis not present

## 2024-07-18 DIAGNOSIS — N186 End stage renal disease: Secondary | ICD-10-CM | POA: Diagnosis not present

## 2024-07-18 DIAGNOSIS — Z992 Dependence on renal dialysis: Secondary | ICD-10-CM | POA: Diagnosis not present

## 2024-07-23 DIAGNOSIS — N2581 Secondary hyperparathyroidism of renal origin: Secondary | ICD-10-CM | POA: Diagnosis not present

## 2024-07-25 DIAGNOSIS — N2581 Secondary hyperparathyroidism of renal origin: Secondary | ICD-10-CM | POA: Diagnosis not present

## 2024-07-25 DIAGNOSIS — Z992 Dependence on renal dialysis: Secondary | ICD-10-CM | POA: Diagnosis not present

## 2024-07-25 DIAGNOSIS — N186 End stage renal disease: Secondary | ICD-10-CM | POA: Diagnosis not present

## 2024-07-28 DIAGNOSIS — N186 End stage renal disease: Secondary | ICD-10-CM | POA: Diagnosis not present

## 2024-08-07 ENCOUNTER — Encounter: Payer: Self-pay | Admitting: Podiatry

## 2024-08-07 ENCOUNTER — Ambulatory Visit (INDEPENDENT_AMBULATORY_CARE_PROVIDER_SITE_OTHER): Admitting: Podiatry

## 2024-08-07 DIAGNOSIS — B351 Tinea unguium: Secondary | ICD-10-CM | POA: Diagnosis not present

## 2024-08-07 DIAGNOSIS — M79675 Pain in left toe(s): Secondary | ICD-10-CM | POA: Diagnosis not present

## 2024-08-07 DIAGNOSIS — M79674 Pain in right toe(s): Secondary | ICD-10-CM

## 2024-08-07 DIAGNOSIS — N184 Chronic kidney disease, stage 4 (severe): Secondary | ICD-10-CM

## 2024-08-07 DIAGNOSIS — E1122 Type 2 diabetes mellitus with diabetic chronic kidney disease: Secondary | ICD-10-CM | POA: Diagnosis not present

## 2024-08-07 DIAGNOSIS — E118 Type 2 diabetes mellitus with unspecified complications: Secondary | ICD-10-CM

## 2024-08-07 NOTE — Progress Notes (Addendum)
 This patient returns to my office for at risk foot care.  This patient requires this care by a professional since this patient will be at risk due to having CKD, and type 2 diabetes  This patient is unable to cut nails himself since the patient cannot reach his nails.These nails are painful walking and wearing shoes.  This patient presents for at risk foot care today.  General Appearance  Alert, conversant and in no acute stress.  Vascular  Dorsalis pedis and posterior tibial  pulses are weakly palpable  bilaterally.  Capillary return is within normal limits  bilaterally. Temperature is within normal limits  bilaterally.  Neurologic  Senn-Weinstein monofilament wire test within normal limits  bilaterally. Muscle power within normal limits bilaterally.  Nails Thick disfigured discolored nails with subungual debris  from hallux to fifth toes bilaterally. No evidence of bacterial infection or drainage bilaterally.  Orthopedic  No limitations of motion  feet .  No crepitus or effusions noted.  No bony pathology or digital deformities noted. HAV  B/L.  Skin  normotropic skin with no porokeratosis noted bilaterally.  No signs of infections or ulcers noted.     Onychomycosis  Pain in right toes  Pain in left toes  Consent was obtained for treatment procedures.   Mechanical debridement of nails 1-5  bilaterally performed with a nail nipper.  Filed with dremel without incident.    Return office visit    3 months                 Told patient to return for periodic foot care and evaluation due to potential at risk complications.   Cordella Bold DPM tomma

## 2024-08-08 DIAGNOSIS — N186 End stage renal disease: Secondary | ICD-10-CM | POA: Diagnosis not present

## 2024-08-11 DIAGNOSIS — Z992 Dependence on renal dialysis: Secondary | ICD-10-CM | POA: Diagnosis not present

## 2024-08-11 DIAGNOSIS — N186 End stage renal disease: Secondary | ICD-10-CM | POA: Diagnosis not present

## 2024-08-11 DIAGNOSIS — N2581 Secondary hyperparathyroidism of renal origin: Secondary | ICD-10-CM | POA: Diagnosis not present

## 2024-08-13 DIAGNOSIS — Z992 Dependence on renal dialysis: Secondary | ICD-10-CM | POA: Diagnosis not present

## 2024-08-13 DIAGNOSIS — N2581 Secondary hyperparathyroidism of renal origin: Secondary | ICD-10-CM | POA: Diagnosis not present

## 2024-08-13 DIAGNOSIS — N186 End stage renal disease: Secondary | ICD-10-CM | POA: Diagnosis not present

## 2024-08-15 DIAGNOSIS — Z992 Dependence on renal dialysis: Secondary | ICD-10-CM | POA: Diagnosis not present

## 2024-08-20 DIAGNOSIS — N186 End stage renal disease: Secondary | ICD-10-CM | POA: Diagnosis not present

## 2024-08-22 DIAGNOSIS — Z992 Dependence on renal dialysis: Secondary | ICD-10-CM | POA: Diagnosis not present

## 2024-08-22 DIAGNOSIS — N2581 Secondary hyperparathyroidism of renal origin: Secondary | ICD-10-CM | POA: Diagnosis not present

## 2024-08-25 DIAGNOSIS — N186 End stage renal disease: Secondary | ICD-10-CM | POA: Diagnosis not present

## 2024-08-25 DIAGNOSIS — Z992 Dependence on renal dialysis: Secondary | ICD-10-CM | POA: Diagnosis not present

## 2024-08-25 DIAGNOSIS — N2581 Secondary hyperparathyroidism of renal origin: Secondary | ICD-10-CM | POA: Diagnosis not present

## 2024-08-26 ENCOUNTER — Encounter (HOSPITAL_COMMUNITY): Payer: Self-pay

## 2024-08-28 ENCOUNTER — Other Ambulatory Visit: Payer: Self-pay

## 2024-08-28 ENCOUNTER — Encounter (HOSPITAL_COMMUNITY)
Admission: RE | Disposition: A | Discharge status: Home / Self Care | Payer: Self-pay | Source: Home / Self Care | Attending: Vascular Surgery

## 2024-08-28 ENCOUNTER — Encounter (HOSPITAL_COMMUNITY): Payer: Self-pay | Admitting: Vascular Surgery

## 2024-08-28 ENCOUNTER — Ambulatory Visit (HOSPITAL_COMMUNITY)
Admission: RE | Admit: 2024-08-28 | Discharge: 2024-08-28 | Disposition: A | Discharge status: Home / Self Care | Attending: Vascular Surgery | Admitting: Vascular Surgery

## 2024-08-28 DIAGNOSIS — E1122 Type 2 diabetes mellitus with diabetic chronic kidney disease: Secondary | ICD-10-CM | POA: Diagnosis not present

## 2024-08-28 DIAGNOSIS — I120 Hypertensive chronic kidney disease with stage 5 chronic kidney disease or end stage renal disease: Secondary | ICD-10-CM | POA: Diagnosis not present

## 2024-08-28 DIAGNOSIS — Z87891 Personal history of nicotine dependence: Secondary | ICD-10-CM | POA: Diagnosis not present

## 2024-08-28 DIAGNOSIS — T82898A Other specified complication of vascular prosthetic devices, implants and grafts, initial encounter: Secondary | ICD-10-CM | POA: Diagnosis not present

## 2024-08-28 DIAGNOSIS — N186 End stage renal disease: Secondary | ICD-10-CM | POA: Diagnosis not present

## 2024-08-28 DIAGNOSIS — Y832 Surgical operation with anastomosis, bypass or graft as the cause of abnormal reaction of the patient, or of later complication, without mention of misadventure at the time of the procedure: Secondary | ICD-10-CM | POA: Insufficient documentation

## 2024-08-28 DIAGNOSIS — Z992 Dependence on renal dialysis: Secondary | ICD-10-CM | POA: Insufficient documentation

## 2024-08-28 SURGERY — A/V FISTULAGRAM
Anesthesia: LOCAL | Site: Arm Upper | Laterality: Left

## 2024-08-28 MED ORDER — HEPARIN (PORCINE) IN NACL 1000-0.9 UT/500ML-% IV SOLN
INTRAVENOUS | Status: DC | PRN
  Administered 2024-08-28: 500 mL

## 2024-08-28 MED ORDER — LIDOCAINE HCL (PF) 1 % IJ SOLN
INTRAMUSCULAR | Status: DC | PRN
  Administered 2024-08-28: 5 mL

## 2024-08-28 MED ORDER — LIDOCAINE HCL (PF) 1 % IJ SOLN
INTRAMUSCULAR | Status: AC
Start: 2024-08-28 — End: 2024-08-28
  Filled 2024-08-28: qty 30

## 2024-08-28 MED ORDER — IODIXANOL 320 MG/ML IV SOLN
INTRAVENOUS | Status: DC | PRN
  Administered 2024-08-28: 25 mL via INTRAVENOUS

## 2024-08-28 SURGICAL SUPPLY — 4 items
KIT MICROPUNCTURE NIT STIFF (SHEATH) IMPLANT
STOPCOCK MORSE 400PSI 3WAY (MISCELLANEOUS) IMPLANT
TRAY PV CATH (CUSTOM PROCEDURE TRAY) ×1 IMPLANT
TUBING CIL FLEX 10 FLL-RA (TUBING) IMPLANT

## 2024-08-28 NOTE — H&P (Signed)
 HD ACCESS CENTER H&P   Patient ID: David Nguyen, male   DOB: 03/01/1941, 83 y.o.   MRN: [id number]  Subjective:     HPI David Nguyen is a 83 y.o. male with ESRD presenting for evaluation of HD access and consideration of intervention. Referred by: Dr. Gearline Having issues with low flows during sessions  Past Medical History:  Diagnosis Date   Allergic rhinitis    Anemia    low iron   Arthritis    left leg   Bradycardia 03/2019   Chronic kidney disease    Followed by Dr Lonna     Diabetes mellitus    Type II   Horseshoe kidney    Hyperlipidemia    Hypertension    Hypothyroidism    Refusal of blood transfusions as patient is Jehovah's Witness    Scrotal edema    Sleep apnea    uses a cpap   Family History  Problem Relation Age of Onset   Prostate cancer Father    Kidney failure Mother    Lung cancer Sister    Past Surgical History:  Procedure Laterality Date   A/V FISTULAGRAM Left 03/05/2020   Procedure: A/V FISTULAGRAM - Left Arm;  Surgeon: Eliza Lonni RAMAN, MD;  Location: [facility] INVASIVE CV LAB;  Service: Cardiovascular;  Laterality: Left;   ADRENALECTOMY  1993   left   AV FISTULA PLACEMENT Left 05/06/2018   Procedure: ARTERIOVENOUS (AV) FISTULA CREATION RADIOCEPHALIC;  Surgeon: Oris Krystal FALCON, MD;  Location: [facility] OR;  Service: Vascular;  Laterality: Left;   COLONOSCOPY W/ POLYPECTOMY     INGUINAL HERNIA REPAIR Right 07/05/2020   Procedure: HERNIA REPAIR INGUINAL ADULT;  Surgeon: Vernetta Berg, MD;  Location: [facility] OR;  Service: General;  Laterality: Right;  LMA   INSERTION OF MESH Right 07/05/2020   Procedure: INSERTION OF MESH;  Surgeon: Vernetta Berg, MD;  Location: [facility] OR;  Service: General;  Laterality: Right;   REVISION OF ARTERIOVENOUS GORETEX GRAFT Left 07/11/2018   Procedure: TRANSPOSITION OF RADIOCEPHALIC  ARTERIOVENOUS FISTULA LEFT ARM;  Surgeon: Oris Krystal FALCON, MD;  Location: [facility] OR;  Service: Vascular;  Laterality: Left;   REVISION OF  ARTERIOVENOUS GORETEX GRAFT Left 03/12/2020   Procedure: REVISION OF LEFT RADIOCEPHALIC FISTULA;  Surgeon: Oris Krystal FALCON, MD;  Location: [facility] OR;  Service: Vascular;  Laterality: Left;    Short Social History:  Social History   Tobacco Use   Smoking status: Former    Current packs/day: 0.00    Average packs/day: 0.3 packs/day for 3.0 years (0.9 ttl pk-yrs)    Types: Cigarettes    Start date: 10/09/1965    Quit date: 10/09/1968    Years since quitting: 55.9   Smokeless tobacco: Never  Substance Use Topics   Alcohol  use: No    Allergies  Allergen Reactions   Penicillins Rash and Other (See Comments)    Childhood allergy       No current facility-administered medications for this encounter.    REVIEW OF SYSTEMS All other systems were reviewed and are negative     Objective:   Objective   Vitals:   08/28/24 0801  BP: 137/72  Pulse: 71  Resp: 16  SpO2: 98%   There is no height or weight on file to calculate BMI.  Physical Exam General: no acute distress Cardiac: hemodynamically stable Extremities: Faint thrill in left arm radiocephalic fistula      Assessment/Plan:   David Nguyen is a 83 y.o. male with  ESRD and a poorly functioning left arm AVF. Having issues with low flows during sessions. Last HD session yesterday. I offered fistulogram I explained that with fistulogram we could identify and potentially treat any areas of stenosis that put this access at risk for thrombosis in the future. Reviewed risks and benefits and patient agreed to proceed.   Norman Serve, MD [facility]

## 2024-08-28 NOTE — Op Note (Signed)
    Patient name: David Nguyen MRN: [id number] DOB: 07/23/1941 Sex: male  08/28/2024 Pre-operative Diagnosis: ESRD on HD Post-operative diagnosis:  Same Surgeon:  Norman GORMAN Serve, MD Procedure Performed:  Outpatient evaluation, level 3 Ultrasound-guided access of dialysis circuit, fistulogram and central venogram.  [id number]  Indications: Mr. Boys is a 83 year old male with ESRD on HD presents to the HD access center due to having low flows during dialysis sessions.  His last HD session was yesterday.  We offered fistulogram and the rest of benefits were reviewed.  He elected to proceed.  Findings:  Widely patent central venous system.  Widely patent outflow veins in the upper arm.  Widely patent radiocephalic fistula and anastomosis.   Procedure:  The patient was identified in the holding area and taken to the cath lab  The patient was then placed supine on the table and prepped and draped in the usual sterile fashion.  A time out was called.  Ultrasound was used to evaluate the left arm AV access. This was accessed under u/s guidance. An 018 wire was advanced without resistance, a micropuncture sheath was placed and fistulagram obtained which demonstrated the above findings.  The micro sheath was removed and the access was managed with a 4 Monocryl figure-of-eight suture for hemostasis.  Contrast: 25 cc Sedation: None  Impression: Widely patent AVF, no flow-limiting stenosis throughout the outflow veins or central system.   Norman GORMAN Serve MD [facility] [place] Office: [phone]

## 2024-09-02 DIAGNOSIS — N2581 Secondary hyperparathyroidism of renal origin: Secondary | ICD-10-CM | POA: Diagnosis not present

## 2024-09-02 DIAGNOSIS — N186 End stage renal disease: Secondary | ICD-10-CM | POA: Diagnosis not present

## 2024-09-05 DIAGNOSIS — N2581 Secondary hyperparathyroidism of renal origin: Secondary | ICD-10-CM | POA: Diagnosis not present

## 2024-09-05 DIAGNOSIS — N186 End stage renal disease: Secondary | ICD-10-CM | POA: Diagnosis not present

## 2024-09-05 DIAGNOSIS — Z992 Dependence on renal dialysis: Secondary | ICD-10-CM | POA: Diagnosis not present

## 2024-09-07 DIAGNOSIS — E1122 Type 2 diabetes mellitus with diabetic chronic kidney disease: Secondary | ICD-10-CM | POA: Diagnosis not present

## 2024-09-07 DIAGNOSIS — Z992 Dependence on renal dialysis: Secondary | ICD-10-CM | POA: Diagnosis not present

## 2024-09-07 DIAGNOSIS — N186 End stage renal disease: Secondary | ICD-10-CM | POA: Diagnosis not present

## 2024-09-09 DIAGNOSIS — Z0100 Encounter for examination of eyes and vision without abnormal findings: Secondary | ICD-10-CM | POA: Diagnosis not present

## 2024-11-06 ENCOUNTER — Encounter: Payer: Self-pay | Admitting: Podiatry

## 2024-11-06 ENCOUNTER — Ambulatory Visit (INDEPENDENT_AMBULATORY_CARE_PROVIDER_SITE_OTHER): Admitting: Podiatry

## 2024-11-06 DIAGNOSIS — M79674 Pain in right toe(s): Secondary | ICD-10-CM | POA: Diagnosis not present

## 2024-11-06 DIAGNOSIS — N184 Chronic kidney disease, stage 4 (severe): Secondary | ICD-10-CM | POA: Diagnosis not present

## 2024-11-06 DIAGNOSIS — E118 Type 2 diabetes mellitus with unspecified complications: Secondary | ICD-10-CM

## 2024-11-06 DIAGNOSIS — E1122 Type 2 diabetes mellitus with diabetic chronic kidney disease: Secondary | ICD-10-CM | POA: Diagnosis not present

## 2024-11-06 DIAGNOSIS — M79675 Pain in left toe(s): Secondary | ICD-10-CM | POA: Diagnosis not present

## 2024-11-06 DIAGNOSIS — B351 Tinea unguium: Secondary | ICD-10-CM | POA: Diagnosis not present

## 2024-11-06 NOTE — Progress Notes (Signed)
 This patient returns to my office for at risk foot care.  This patient requires this care by a professional since this patient will be at risk due to having CKD, and type 2 diabetes  This patient is unable to cut nails himself since the patient cannot reach his nails.These nails are painful walking and wearing shoes.  This patient presents for at risk foot care today.  General Appearance  Alert, conversant and in no acute stress.  Vascular  Dorsalis pedis and posterior tibial  pulses are weakly palpable  bilaterally.  Capillary return is within normal limits  bilaterally. Temperature is within normal limits  bilaterally.  Neurologic  Senn-Weinstein monofilament wire test within normal limits  bilaterally. Muscle power within normal limits bilaterally.  Nails Thick disfigured discolored nails with subungual debris  from hallux to fifth toes bilaterally. No evidence of bacterial infection or drainage bilaterally.  Orthopedic  No limitations of motion  feet .  No crepitus or effusions noted.  No bony pathology or digital deformities noted. HAV  B/L.  Skin  normotropic skin with no porokeratosis noted bilaterally.  No signs of infections or ulcers noted.     Onychomycosis  Pain in right toes  Pain in left toes  Consent was obtained for treatment procedures.   Mechanical debridement of nails 1-5  bilaterally performed with a nail nipper.  Filed with dremel without incident.    Return office visit    3 months                 Told patient to return for periodic foot care and evaluation due to potential at risk complications.   Cordella Bold DPM tomma

## 2025-02-05 ENCOUNTER — Ambulatory Visit: Admitting: Podiatry
